# Patient Record
Sex: Female | Born: 1945
Health system: Southern US, Community
[De-identification: ages and names within clinical notes are randomized; demographics above are authoritative.]

## PROBLEM LIST (undated history)

## (undated) DIAGNOSIS — D649 Anemia, unspecified: Secondary | ICD-10-CM

## (undated) DIAGNOSIS — E559 Vitamin D deficiency, unspecified: Secondary | ICD-10-CM

## (undated) DIAGNOSIS — K219 Gastro-esophageal reflux disease without esophagitis: Secondary | ICD-10-CM

## (undated) DIAGNOSIS — R32 Unspecified urinary incontinence: Secondary | ICD-10-CM

## (undated) DIAGNOSIS — C50919 Malignant neoplasm of unspecified site of unspecified female breast: Secondary | ICD-10-CM

## (undated) DIAGNOSIS — D126 Benign neoplasm of colon, unspecified: Secondary | ICD-10-CM

## (undated) DIAGNOSIS — H269 Unspecified cataract: Secondary | ICD-10-CM

## (undated) DIAGNOSIS — K5790 Diverticulosis of intestine, part unspecified, without perforation or abscess without bleeding: Secondary | ICD-10-CM

## (undated) DIAGNOSIS — M549 Dorsalgia, unspecified: Secondary | ICD-10-CM

## (undated) DIAGNOSIS — I10 Essential (primary) hypertension: Secondary | ICD-10-CM

## (undated) DIAGNOSIS — S32000A Wedge compression fracture of unspecified lumbar vertebra, initial encounter for closed fracture: Secondary | ICD-10-CM

## (undated) DIAGNOSIS — H409 Unspecified glaucoma: Secondary | ICD-10-CM

## (undated) DIAGNOSIS — F419 Anxiety disorder, unspecified: Secondary | ICD-10-CM

## (undated) DIAGNOSIS — N8110 Cystocele, unspecified: Secondary | ICD-10-CM

## (undated) DIAGNOSIS — Z780 Asymptomatic menopausal state: Secondary | ICD-10-CM

## (undated) HISTORY — DX: Vitamin D deficiency, unspecified: E55.9

## (undated) HISTORY — DX: Asymptomatic menopausal state: Z78.0

## (undated) HISTORY — DX: Dorsalgia, unspecified: M54.9

## (undated) HISTORY — DX: Unspecified glaucoma: H40.9

## (undated) HISTORY — DX: Gastro-esophageal reflux disease without esophagitis: K21.9

## (undated) HISTORY — PX: POLYPECTOMY: SHX149

## (undated) HISTORY — DX: Anxiety disorder, unspecified: F41.9

## (undated) HISTORY — DX: Unspecified urinary incontinence: R32

## (undated) HISTORY — DX: Diverticulosis of intestine, part unspecified, without perforation or abscess without bleeding: K57.90

## (undated) HISTORY — DX: Malignant neoplasm of unspecified site of unspecified female breast: C50.919

## (undated) HISTORY — PX: COLONOSCOPY: SHX174

## (undated) HISTORY — DX: Wedge compression fracture of unspecified lumbar vertebra, initial encounter for closed fracture: S32.000A

## (undated) HISTORY — DX: Anemia, unspecified: D64.9

---
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
## 1998-05-09 ENCOUNTER — Emergency Department (HOSPITAL_COMMUNITY): Admission: EM | Admit: 1998-05-09 | Discharge: 1998-05-10 | Payer: Self-pay

## 1998-05-11 ENCOUNTER — Emergency Department (HOSPITAL_COMMUNITY): Admission: EM | Admit: 1998-05-11 | Discharge: 1998-05-11 | Payer: Self-pay | Admitting: Emergency Medicine

## 2002-12-05 DIAGNOSIS — D126 Benign neoplasm of colon, unspecified: Secondary | ICD-10-CM

## 2002-12-05 HISTORY — DX: Benign neoplasm of colon, unspecified: D12.6

## 2004-11-17 ENCOUNTER — Other Ambulatory Visit: Admission: RE | Admit: 2004-11-17 | Discharge: 2004-11-17 | Payer: Self-pay | Admitting: Obstetrics and Gynecology

## 2004-11-25 ENCOUNTER — Encounter: Admission: RE | Admit: 2004-11-25 | Discharge: 2004-11-25 | Payer: Self-pay | Admitting: Obstetrics and Gynecology

## 2005-11-21 ENCOUNTER — Other Ambulatory Visit: Admission: RE | Admit: 2005-11-21 | Discharge: 2005-11-21 | Payer: Self-pay | Admitting: Obstetrics and Gynecology

## 2006-10-10 ENCOUNTER — Encounter: Admission: RE | Admit: 2006-10-10 | Discharge: 2006-10-10 | Payer: Self-pay | Admitting: Obstetrics and Gynecology

## 2007-02-26 ENCOUNTER — Other Ambulatory Visit: Admission: RE | Admit: 2007-02-26 | Discharge: 2007-02-26 | Payer: Self-pay | Admitting: Obstetrics and Gynecology

## 2007-10-31 ENCOUNTER — Emergency Department (HOSPITAL_COMMUNITY): Admission: EM | Admit: 2007-10-31 | Discharge: 2007-11-01 | Payer: Self-pay | Admitting: Emergency Medicine

## 2007-11-12 ENCOUNTER — Ambulatory Visit: Payer: Self-pay | Admitting: Gastroenterology

## 2007-12-05 DIAGNOSIS — K5730 Diverticulosis of large intestine without perforation or abscess without bleeding: Secondary | ICD-10-CM | POA: Insufficient documentation

## 2007-12-05 DIAGNOSIS — K449 Diaphragmatic hernia without obstruction or gangrene: Secondary | ICD-10-CM | POA: Insufficient documentation

## 2007-12-05 DIAGNOSIS — M8580 Other specified disorders of bone density and structure, unspecified site: Secondary | ICD-10-CM | POA: Insufficient documentation

## 2007-12-05 DIAGNOSIS — K648 Other hemorrhoids: Secondary | ICD-10-CM | POA: Insufficient documentation

## 2007-12-05 DIAGNOSIS — D126 Benign neoplasm of colon, unspecified: Secondary | ICD-10-CM | POA: Insufficient documentation

## 2007-12-05 DIAGNOSIS — K5732 Diverticulitis of large intestine without perforation or abscess without bleeding: Secondary | ICD-10-CM | POA: Insufficient documentation

## 2007-12-17 ENCOUNTER — Ambulatory Visit: Payer: Self-pay | Admitting: Gastroenterology

## 2007-12-17 ENCOUNTER — Encounter: Payer: Self-pay | Admitting: Gastroenterology

## 2008-03-03 ENCOUNTER — Other Ambulatory Visit: Admission: RE | Admit: 2008-03-03 | Discharge: 2008-03-03 | Payer: Self-pay | Admitting: Obstetrics and Gynecology

## 2008-09-01 ENCOUNTER — Ambulatory Visit: Payer: Self-pay | Admitting: Internal Medicine

## 2008-09-01 DIAGNOSIS — J309 Allergic rhinitis, unspecified: Secondary | ICD-10-CM | POA: Insufficient documentation

## 2008-09-01 DIAGNOSIS — E559 Vitamin D deficiency, unspecified: Secondary | ICD-10-CM | POA: Insufficient documentation

## 2008-09-01 DIAGNOSIS — K219 Gastro-esophageal reflux disease without esophagitis: Secondary | ICD-10-CM | POA: Insufficient documentation

## 2008-09-01 DIAGNOSIS — D509 Iron deficiency anemia, unspecified: Secondary | ICD-10-CM | POA: Insufficient documentation

## 2008-09-01 LAB — CONVERTED CEMR LAB
BUN: 11 mg/dL (ref 6–23)
Basophils Absolute: 0 10*3/uL (ref 0.0–0.1)
Basophils Relative: 0.1 % (ref 0.0–3.0)
CO2: 29 meq/L (ref 19–32)
Calcium: 8.7 mg/dL (ref 8.4–10.5)
Chloride: 112 meq/L (ref 96–112)
Creatinine, Ser: 0.8 mg/dL (ref 0.4–1.2)
Eosinophils Absolute: 0.2 10*3/uL (ref 0.0–0.7)
GFR calc Af Amer: 94 mL/min
GFR calc non Af Amer: 78 mL/min
Glucose, Bld: 102 mg/dL — ABNORMAL HIGH (ref 70–99)
HCT: 38.9 % (ref 36.0–46.0)
Hemoglobin: 13.3 g/dL (ref 12.0–15.0)
Lymphocytes Relative: 26.6 % (ref 12.0–46.0)
MCHC: 34.3 g/dL (ref 30.0–36.0)
MCV: 91.7 fL (ref 78.0–100.0)
Monocytes Absolute: 0.4 10*3/uL (ref 0.1–1.0)
Monocytes Relative: 7.9 % (ref 3.0–12.0)
Neutro Abs: 3.5 10*3/uL (ref 1.4–7.7)
Neutrophils Relative %: 62.3 % (ref 43.0–77.0)
Platelets: 212 10*3/uL (ref 150–400)
Potassium: 4.3 meq/L (ref 3.5–5.1)
RBC: 4.24 M/uL (ref 3.87–5.11)
RDW: 12.1 % (ref 11.5–14.6)
Sodium: 144 meq/L (ref 135–145)
WBC: 5.6 10*3/uL (ref 4.5–10.5)

## 2008-09-02 LAB — CONVERTED CEMR LAB: Vit D, 1,25-Dihydroxy: 39 (ref 30–89)

## 2008-09-15 ENCOUNTER — Ambulatory Visit: Payer: Self-pay | Admitting: Internal Medicine

## 2008-11-24 ENCOUNTER — Ambulatory Visit: Payer: Self-pay | Admitting: Internal Medicine

## 2009-03-09 ENCOUNTER — Other Ambulatory Visit: Admission: RE | Admit: 2009-03-09 | Discharge: 2009-03-09 | Payer: Self-pay | Admitting: Obstetrics and Gynecology

## 2009-05-25 ENCOUNTER — Ambulatory Visit: Payer: Self-pay | Admitting: Internal Medicine

## 2009-05-26 LAB — CONVERTED CEMR LAB
AST: 19 units/L (ref 0–37)
Albumin: 3.7 g/dL (ref 3.5–5.2)
Alkaline Phosphatase: 61 units/L (ref 39–117)
Basophils Absolute: 0 10*3/uL (ref 0.0–0.1)
Basophils Relative: 0.7 % (ref 0.0–3.0)
Bilirubin Urine: NEGATIVE
Bilirubin, Direct: 0.1 mg/dL (ref 0.0–0.3)
CO2: 29 meq/L (ref 19–32)
Creatinine, Ser: 0.7 mg/dL (ref 0.4–1.2)
Eosinophils Absolute: 0.2 10*3/uL (ref 0.0–0.7)
Eosinophils Relative: 2.6 % (ref 0.0–5.0)
GFR calc non Af Amer: 90.15 mL/min (ref >60)
Glucose, Bld: 89 mg/dL (ref 70–99)
HCT: 37.8 % (ref 36.0–46.0)
Hemoglobin, Urine: NEGATIVE
Hemoglobin: 13.3 g/dL (ref 12.0–15.0)
Iron: 84 ug/dL (ref 42–145)
Ketones, ur: NEGATIVE mg/dL
Lymphocytes Relative: 23.4 % (ref 12.0–46.0)
Lymphs Abs: 1.4 10*3/uL (ref 0.7–4.0)
MCV: 91.3 fL (ref 78.0–100.0)
Monocytes Absolute: 0.4 10*3/uL (ref 0.1–1.0)
Monocytes Relative: 7.1 % (ref 3.0–12.0)
Neutro Abs: 4 10*3/uL (ref 1.4–7.7)
Neutrophils Relative %: 66.2 % (ref 43.0–77.0)
Platelets: 214 10*3/uL (ref 150.0–400.0)
Potassium: 4.1 meq/L (ref 3.5–5.1)
RBC: 4.14 M/uL (ref 3.87–5.11)
RDW: 12.4 % (ref 11.5–14.6)
Saturation Ratios: 24.3 % (ref 20.0–50.0)
Sodium: 144 meq/L (ref 135–145)
Total Bilirubin: 0.6 mg/dL (ref 0.3–1.2)
Total Protein, Urine: NEGATIVE mg/dL
Total Protein: 6.8 g/dL (ref 6.0–8.3)
Urine Glucose: NEGATIVE mg/dL
Urobilinogen, UA: 0.2 (ref 0.0–1.0)
WBC: 6 10*3/uL (ref 4.5–10.5)
pH: 6 (ref 5.0–8.0)

## 2009-08-26 ENCOUNTER — Ambulatory Visit: Payer: Self-pay | Admitting: Internal Medicine

## 2009-08-26 LAB — CONVERTED CEMR LAB
ALT: 17 units/L (ref 0–35)
AST: 19 units/L (ref 0–37)
Albumin: 3.8 g/dL (ref 3.5–5.2)
Alkaline Phosphatase: 57 units/L (ref 39–117)
BUN: 12 mg/dL (ref 6–23)
Basophils Absolute: 0 10*3/uL (ref 0.0–0.1)
Basophils Relative: 0.6 % (ref 0.0–3.0)
Bilirubin Urine: NEGATIVE
Bilirubin, Direct: 0.1 mg/dL (ref 0.0–0.3)
CO2: 29 meq/L (ref 19–32)
Calcium: 9.1 mg/dL (ref 8.4–10.5)
Chloride: 110 meq/L (ref 96–112)
Cholesterol: 162 mg/dL (ref 0–200)
Creatinine, Ser: 0.7 mg/dL (ref 0.4–1.2)
Eosinophils Absolute: 0.1 10*3/uL (ref 0.0–0.7)
Eosinophils Relative: 2.7 % (ref 0.0–5.0)
GFR calc non Af Amer: 90.08 mL/min (ref >60)
Glucose, Bld: 85 mg/dL (ref 70–99)
HCT: 38.8 % (ref 36.0–46.0)
HDL: 44.5 mg/dL (ref >39.00)
Hemoglobin, Urine: NEGATIVE
Hemoglobin: 13.5 g/dL (ref 12.0–15.0)
Ketones, ur: NEGATIVE mg/dL
LDL Cholesterol: 104 mg/dL — ABNORMAL HIGH (ref 0–99)
Leukocytes, UA: NEGATIVE
Lymphocytes Relative: 22.1 % (ref 12.0–46.0)
Lymphs Abs: 1.1 10*3/uL (ref 0.7–4.0)
MCHC: 34.7 g/dL (ref 30.0–36.0)
MCV: 91.9 fL (ref 78.0–100.0)
Monocytes Absolute: 0.3 10*3/uL (ref 0.1–1.0)
Monocytes Relative: 6.6 % (ref 3.0–12.0)
Neutro Abs: 3.6 10*3/uL (ref 1.4–7.7)
Neutrophils Relative %: 68 % (ref 43.0–77.0)
Nitrite: NEGATIVE
Platelets: 214 10*3/uL (ref 150.0–400.0)
Potassium: 4.2 meq/L (ref 3.5–5.1)
RBC: 4.23 M/uL (ref 3.87–5.11)
RDW: 12.3 % (ref 11.5–14.6)
Sodium: 143 meq/L (ref 135–145)
Specific Gravity, Urine: 1.025 (ref 1.000–1.030)
TSH: 1.13 microintl units/mL (ref 0.35–5.50)
Total Bilirubin: 0.6 mg/dL (ref 0.3–1.2)
Total CHOL/HDL Ratio: 4
Total Protein, Urine: NEGATIVE mg/dL
Total Protein: 7 g/dL (ref 6.0–8.3)
Triglycerides: 69 mg/dL (ref 0.0–149.0)
Urine Glucose: NEGATIVE mg/dL
Urobilinogen, UA: 0.2 (ref 0.0–1.0)
VLDL: 13.8 mg/dL (ref 0.0–40.0)
WBC: 5.1 10*3/uL (ref 4.5–10.5)
pH: 5.5 (ref 5.0–8.0)

## 2009-08-31 ENCOUNTER — Ambulatory Visit: Payer: Self-pay | Admitting: Internal Medicine

## 2009-08-31 DIAGNOSIS — J029 Acute pharyngitis, unspecified: Secondary | ICD-10-CM | POA: Insufficient documentation

## 2009-12-01 ENCOUNTER — Encounter: Admission: RE | Admit: 2009-12-01 | Discharge: 2009-12-01 | Payer: Self-pay | Admitting: Obstetrics and Gynecology

## 2009-12-21 ENCOUNTER — Ambulatory Visit: Payer: Self-pay | Admitting: Internal Medicine

## 2010-03-15 ENCOUNTER — Other Ambulatory Visit: Admission: RE | Admit: 2010-03-15 | Discharge: 2010-03-15 | Payer: Self-pay | Admitting: Obstetrics and Gynecology

## 2010-09-27 ENCOUNTER — Ambulatory Visit: Payer: Self-pay | Admitting: Internal Medicine

## 2011-01-06 NOTE — Assessment & Plan Note (Signed)
Summary: FLU SHOT/JSS   Nurse Visit   Allergies: 1)  ! Levaquin (Levofloxacin in D5w) 2)  Benazepril Hcl (Benazepril Hcl)  Orders Added: 1)  Admin 1st Vaccine [90471] 2)  Flu Vaccine 29yrs + [16109]  Flu Vaccine Consent Questions     Do you have a history of severe allergic reactions to this vaccine? no    Any prior history of allergic reactions to egg and/or gelatin? no    Do you have a sensitivity to the preservative Thimersol? no    Do you have a past history of Guillan-Barre Syndrome? no    Do you currently have an acute febrile illness? no    Have you ever had a severe reaction to latex? no    Vaccine information given and explained to patient? yes    Are you currently pregnant? no    Lot Number:[id number]   Exp Date:06/04/2011   Site Given  Left Deltoid IM1

## 2011-01-06 NOTE — Assessment & Plan Note (Signed)
Summary: SHINGLE VAC-PER SPOUSE INSUR WILL PAY  -AVP  STC   Nurse Visit   Allergies: 1)  ! Levaquin (Levofloxacin in D5w) 2)  Benazepril Hcl (Benazepril Hcl)  Immunizations Administered:  Zostavax # 1:    Vaccine Type: Zostavax    Site: left deltoid    Mfr: Merck    Dose: 0.5 ml    Route: IM    Given by: Lucious Groves    Exp. Date: 01/01/2011    Lot #: [id number]    VIS given: 09/16/05 given December 21, 2009.  Orders Added: 1)  Zoster (Shingles) Vaccine Live [90736] 2)  Admin 1st Vaccine [phone]

## 2011-04-19 NOTE — Assessment & Plan Note (Signed)
[facility]                         GASTROENTEROLOGY OFFICE NOTE   Diane, Sawyer                         MRN:          [id number]  DATE:11/12/2007                            DOB:          January 29, 1946    REFERRING PHYSICIAN:  Devoria Albe, M.D.   REASON FOR CONSULTATION:  Diverticulitis.   HISTORY OF PRESENT ILLNESS:  Diane Sawyer is a 65 year old white female whom  I have seen in the past.  She underwent a colonoscopy in May 2004, that  revealed sigmoid colon diverticulosis, internal hemorrhoids and a small  adenomatous colon polyp.  She was recommended to have a recall  colonoscopy in May 2009.  She noted worsening problems with left lower  quadrant abdominal and pelvic pain that gradually worsened over several  days, and she presented to an Urgent Care Center on October 31, 2007.  She was then referred to [facility] Emergency Room for further  evaluation, where an elevated white blood cell count was noted and a CT  scan of the abdomen and pelvis showed sigmoid colon diverticulitis with  a small amount of free fluid in the pelvis and a moderate-sized hiatal  hernia was also noted.  She was treated with a 10-day course of Cipro  and Flagyl and her symptoms have essentially resolved.  She notes only a  very minimal amount of left lower quadrant pain.  She has had some  nausea and diarrhea, that began since starting antibiotics.  She notes  no bleeding or weight loss.  There is no family history of colon cancer,  colon polyps or inflammatory bowel disease.   PAST MEDICAL HISTORY:  1. Osteopenia.  2. Diverticulosis.  3. Diverticulitis.  4. Internal hemorrhoids.  5. Adenomatous colon polyp.  6. Hialal hernia   CURRENT MEDICATIONS:  Listed on the chart, updated and reviewed.   ALLERGIES:  LEVAQUIN AND GUAIPHENESIN.   SOCIAL HISTORY/REVIEW OF SYSTEMS:  Per the handwritten form.   PHYSICAL EXAMINATION:  GENERAL:  Well-developed and  well-nourished,  mildly anxious white female.  VITAL SIGNS:  Height 5 feet 3 inches, weight 191 pounds, blood pressure  146/80, pulse 80 and regular.  HEENT:  Anicteric sclerae.  Oropharynx clear.  CHEST:  Clear to auscultation bilaterally.  HEART:  A regular rate and rhythm without murmurs appreciated.  ABDOMEN:  Soft, non-distended.  Minimal left lower quadrant tenderness  to deep palpation.  No rebound or guarding.  No palpable organomegaly,  masses or herniae.  Normoactive bowel sounds.  RECTAL:  Examination deferred until time of colonoscopy.  EXTREMITIES:  Without clubbing, cyanosis or edema.  NEUROLOGIC:  Alert and oriented x3.  Grossly nonfocal.   ASSESSMENT/PLAN:  Recent sigmoid colon diverticulitis - clinically  resolving. A personal history of adenomatous colon polyps.  Rule out  colorectal neoplasm.  The risks, benefits and alternatives to  colonoscopy with possible biopsy and possible polypectomy discussed with  the patient.  She consents to proceed.  This will be scheduled  electively.  She is advised to maintain a high-fiber diet with adequate  fluid intake.  If her nausea and  diarrhea do not resolve off of  antibiotics, she will call for further recommendations.     Diane Sawyer. Diane Dar, MD, [facility]  Electronically Signed    MTS/MedQ  DD: 11/12/2007  DT: 11/12/2007  Job #: [id number]   cc:   Devoria Albe, M.D.

## 2011-09-13 LAB — CBC
HCT: 38.6
Hemoglobin: 13.1
MCHC: 33.9
MCV: 88.6
Platelets: 231
RBC: 4.36
RDW: 12.3
WBC: 11.7 — ABNORMAL HIGH

## 2011-09-13 LAB — COMPREHENSIVE METABOLIC PANEL
ALT: 13
AST: 16
Albumin: 3.8
Alkaline Phosphatase: 57
BUN: 6
CO2: 27
Calcium: 8.9
Chloride: 106
Creatinine, Ser: 0.69
GFR calc Af Amer: >60
GFR calc non Af Amer: >60
Glucose, Bld: 104 — ABNORMAL HIGH
Potassium: 3.7
Sodium: 138
Total Bilirubin: 0.8
Total Protein: 6.8

## 2011-09-13 LAB — DIFFERENTIAL
Basophils Absolute: 0
Basophils Relative: 0
Eosinophils Absolute: 0.1 — ABNORMAL LOW
Eosinophils Relative: 1
Lymphocytes Relative: 14
Lymphs Abs: 1.7
Monocytes Absolute: 0.5
Monocytes Relative: 5
Neutro Abs: 9.4 — ABNORMAL HIGH
Neutrophils Relative %: 80 — ABNORMAL HIGH

## 2011-09-13 LAB — URINALYSIS, ROUTINE W REFLEX MICROSCOPIC
Bilirubin Urine: NEGATIVE
Glucose, UA: NEGATIVE
Hgb urine dipstick: NEGATIVE
Ketones, ur: NEGATIVE
Nitrite: NEGATIVE
Protein, ur: NEGATIVE
Specific Gravity, Urine: 1.013
Urobilinogen, UA: 0.2
pH: 6.5

## 2011-09-14 ENCOUNTER — Telehealth: Payer: Self-pay | Admitting: *Deleted

## 2011-09-14 MED ORDER — AMLODIPINE BESYLATE 5 MG PO TABS: 5.0000 mg | ORAL_TABLET | Freq: Every day | ORAL | Status: DC

## 2011-09-14 NOTE — Telephone Encounter (Signed)
Patient requesting RF of her BP med, she will be out today.

## 2011-09-14 NOTE — Telephone Encounter (Signed)
Pt informed Rf sent to pharmacy.

## 2011-11-01 ENCOUNTER — Other Ambulatory Visit: Payer: Self-pay | Admitting: Internal Medicine

## 2011-11-01 DIAGNOSIS — Z1231 Encounter for screening mammogram for malignant neoplasm of breast: Secondary | ICD-10-CM

## 2011-11-04 ENCOUNTER — Ambulatory Visit
Admission: RE | Admit: 2011-11-04 | Discharge: 2011-11-04 | Disposition: A | Discharge status: Home / Self Care | Payer: BC Managed Care – PPO | Source: Ambulatory Visit | Attending: Internal Medicine | Admitting: Internal Medicine

## 2011-11-04 DIAGNOSIS — Z1231 Encounter for screening mammogram for malignant neoplasm of breast: Secondary | ICD-10-CM

## 2011-11-07 ENCOUNTER — Encounter: Payer: Self-pay | Admitting: Internal Medicine

## 2011-11-07 ENCOUNTER — Ambulatory Visit (INDEPENDENT_AMBULATORY_CARE_PROVIDER_SITE_OTHER): Payer: BC Managed Care – PPO | Admitting: Internal Medicine

## 2011-11-07 DIAGNOSIS — G4700 Insomnia, unspecified: Secondary | ICD-10-CM

## 2011-11-07 DIAGNOSIS — K219 Gastro-esophageal reflux disease without esophagitis: Secondary | ICD-10-CM

## 2011-11-07 DIAGNOSIS — E559 Vitamin D deficiency, unspecified: Secondary | ICD-10-CM

## 2011-11-07 DIAGNOSIS — I10 Essential (primary) hypertension: Secondary | ICD-10-CM

## 2011-11-07 NOTE — Assessment & Plan Note (Addendum)
Continue with current prescription/OTC Prilosec therapy as reflected on the Med list.

## 2011-11-07 NOTE — Assessment & Plan Note (Signed)
Declined Rx She will try Valerian

## 2011-11-07 NOTE — Assessment & Plan Note (Signed)
Continue with current prescription therapy as reflected on the Med list.  

## 2011-11-07 NOTE — Progress Notes (Signed)
  Subjective:    Patient ID: Diane Sawyer, female    DOB: [birth year], 65 y.o.   MRN: [id number]  HPI  The patient presents for a follow-up of  chronic hypertension, GERD controlled with medicines C/o stress and insomnia - husband was sick    Review of Systems  Constitutional: Positive for unexpected weight change. Negative for chills, activity change, appetite change and fatigue.  HENT: Negative for congestion, mouth sores and sinus pressure.   Eyes: Negative for visual disturbance.  Respiratory: Negative for cough and chest tightness.   Gastrointestinal: Negative for nausea and abdominal pain.  Genitourinary: Negative for frequency, difficulty urinating and vaginal pain.  Musculoskeletal: Negative for back pain and gait problem.  Skin: Negative for pallor and rash.  Neurological: Negative for dizziness, tremors, weakness, numbness and headaches.  Psychiatric/Behavioral: Negative for confusion and sleep disturbance.   Wt Readings from Last 3 Encounters:  11/07/11 211 lb (95.709 kg)  08/31/09 201 lb (91.173 kg)  05/25/09 201 lb (91.173 kg)       Objective:   Physical Exam  Constitutional: She appears well-developed. No distress.       obese  HENT:  Head: Normocephalic.  Right Ear: External ear normal.  Left Ear: External ear normal.  Nose: Nose normal.  Mouth/Throat: Oropharynx is clear and moist.  Eyes: Conjunctivae are normal. Pupils are equal, round, and reactive to light. Right eye exhibits no discharge. Left eye exhibits no discharge.  Neck: Normal range of motion. Neck supple. No JVD present. No tracheal deviation present. No thyromegaly present.  Cardiovascular: Normal rate, regular rhythm and normal heart sounds.   Pulmonary/Chest: No stridor. No respiratory distress. She has no wheezes.  Abdominal: Soft. Bowel sounds are normal. She exhibits no distension and no mass. There is no tenderness. There is no rebound and no guarding.  Musculoskeletal: She exhibits no edema  and no tenderness.  Lymphadenopathy:    She has no cervical adenopathy.  Neurological: She displays normal reflexes. No cranial nerve deficit. She exhibits normal muscle tone. Coordination normal.  Skin: No rash noted. No erythema.  Psychiatric: She has a normal mood and affect. Her behavior is normal. Judgment and thought content normal.          Assessment & Plan:

## 2011-12-26 ENCOUNTER — Other Ambulatory Visit: Payer: Self-pay

## 2011-12-26 MED ORDER — AMLODIPINE BESYLATE 5 MG PO TABS: 5.0000 mg | ORAL_TABLET | Freq: Every day | ORAL | Status: DC

## 2012-05-07 ENCOUNTER — Other Ambulatory Visit (INDEPENDENT_AMBULATORY_CARE_PROVIDER_SITE_OTHER): Payer: BC Managed Care – PPO

## 2012-05-07 ENCOUNTER — Encounter: Payer: Self-pay | Admitting: Internal Medicine

## 2012-05-07 ENCOUNTER — Ambulatory Visit (INDEPENDENT_AMBULATORY_CARE_PROVIDER_SITE_OTHER): Payer: BC Managed Care – PPO | Admitting: Internal Medicine

## 2012-05-07 VITALS — BP 130/80 | HR 80 | Temp 98.2°F | Resp 16 | Wt 212.0 lb

## 2012-05-07 DIAGNOSIS — D509 Iron deficiency anemia, unspecified: Secondary | ICD-10-CM

## 2012-05-07 DIAGNOSIS — K219 Gastro-esophageal reflux disease without esophagitis: Secondary | ICD-10-CM

## 2012-05-07 DIAGNOSIS — I10 Essential (primary) hypertension: Secondary | ICD-10-CM

## 2012-05-07 LAB — URINALYSIS
Bilirubin Urine: NEGATIVE
Hgb urine dipstick: NEGATIVE
Ketones, ur: NEGATIVE
Leukocytes, UA: NEGATIVE
Specific Gravity, Urine: 1.01 (ref 1.000–1.030)
Urine Glucose: NEGATIVE
Urobilinogen, UA: 0.2 (ref 0.0–1.0)
pH: 6 (ref 5.0–8.0)

## 2012-05-07 LAB — CBC WITH DIFFERENTIAL/PLATELET
Basophils Absolute: 0 K/uL (ref 0.0–0.1)
Basophils Relative: 0.5 % (ref 0.0–3.0)
Eosinophils Absolute: 0.3 K/uL (ref 0.0–0.7)
Eosinophils Relative: 3.9 % (ref 0.0–5.0)
HCT: 40.4 % (ref 36.0–46.0)
Hemoglobin: 13.4 g/dL (ref 12.0–15.0)
Lymphocytes Relative: 30.4 % (ref 12.0–46.0)
Lymphs Abs: 2 K/uL (ref 0.7–4.0)
MCHC: 33.1 g/dL (ref 30.0–36.0)
MCV: 90.8 fl (ref 78.0–100.0)
Monocytes Absolute: 0.5 K/uL (ref 0.1–1.0)
Monocytes Relative: 6.9 % (ref 3.0–12.0)
Neutro Abs: 3.8 K/uL (ref 1.4–7.7)
Neutrophils Relative %: 58.3 % (ref 43.0–77.0)
Platelets: 219 K/uL (ref 150.0–400.0)
RBC: 4.45 Mil/uL (ref 3.87–5.11)
RDW: 13.4 % (ref 11.5–14.6)
WBC: 6.5 K/uL (ref 4.5–10.5)

## 2012-05-07 LAB — TSH: TSH: 0.84 u[IU]/mL (ref 0.35–5.50)

## 2012-05-07 LAB — BASIC METABOLIC PANEL
BUN: 12 mg/dL (ref 6–23)
CO2: 27 mEq/L (ref 19–32)
Calcium: 9.1 mg/dL (ref 8.4–10.5)
Chloride: 105 mEq/L (ref 96–112)
Creatinine, Ser: 0.6 mg/dL (ref 0.4–1.2)
GFR: 115.53 mL/min (ref >60.00)
Glucose, Bld: 81 mg/dL (ref 70–99)
Potassium: 4.3 mEq/L (ref 3.5–5.1)
Sodium: 141 mEq/L (ref 135–145)

## 2012-05-07 LAB — IBC PANEL
Iron: 97 ug/dL (ref 42–145)
Saturation Ratios: 30.2 % (ref 20.0–50.0)
Transferrin: 229.7 mg/dL (ref 212.0–360.0)

## 2012-05-07 MED ORDER — PANTOPRAZOLE SODIUM 40 MG PO TBEC: 40.0000 mg | DELAYED_RELEASE_TABLET | Freq: Every day | ORAL | Status: DC

## 2012-05-07 NOTE — Progress Notes (Signed)
Patient ID: Diane Sawyer, female   DOB: 07-08-1946, 66 y.o.   MRN: [id number]  Subjective:    Patient ID: Diane Sawyer, female    DOB: 1946/04/07, 66 y.o.   MRN: [id number]  HPI  The patient presents for a follow-up of  chronic hypertension, GERD controlled with medicines C/o stress and insomnia - husband just had a bypass    Review of Systems  Constitutional: Negative for chills, activity change, appetite change, fatigue and unexpected weight change.  HENT: Negative for congestion, mouth sores and sinus pressure.   Eyes: Negative for visual disturbance.  Respiratory: Negative for cough and chest tightness.   Gastrointestinal: Negative for nausea and abdominal pain.  Genitourinary: Negative for frequency, difficulty urinating and vaginal pain.  Musculoskeletal: Negative for back pain and gait problem.  Skin: Negative for pallor and rash.  Neurological: Negative for dizziness, tremors, weakness, numbness and headaches.  Psychiatric/Behavioral: Negative for suicidal ideas, confusion and sleep disturbance. The patient is not nervous/anxious.    Wt Readings from Last 3 Encounters:  05/07/12 212 lb (96.163 kg)  11/07/11 211 lb (95.709 kg)  08/31/09 201 lb (91.173 kg)    BP Readings from Last 3 Encounters:  05/07/12 130/80  11/07/11 104/76  08/31/09 96/66       Objective:   Physical Exam  Constitutional: She appears well-developed. No distress.       obese  HENT:  Head: Normocephalic.  Right Ear: External ear normal.  Left Ear: External ear normal.  Nose: Nose normal.  Mouth/Throat: Oropharynx is clear and moist.  Eyes: Conjunctivae are normal. Pupils are equal, round, and reactive to light. Right eye exhibits no discharge. Left eye exhibits no discharge.  Neck: Normal range of motion. Neck supple. No JVD present. No tracheal deviation present. No thyromegaly present.  Cardiovascular: Normal rate, regular rhythm and normal heart sounds.   Pulmonary/Chest: No stridor. No  respiratory distress. She has no wheezes.  Abdominal: Soft. Bowel sounds are normal. She exhibits no distension and no mass. There is no tenderness. There is no rebound and no guarding.  Musculoskeletal: She exhibits no edema and no tenderness.  Lymphadenopathy:    She has no cervical adenopathy.  Neurological: She displays normal reflexes. No cranial nerve deficit. She exhibits normal muscle tone. Coordination normal.  Skin: No rash noted. No erythema.  Psychiatric: She has a normal mood and affect. Her behavior is normal. Judgment and thought content normal.    Lab Results  Component Value Date   WBC 5.1 08/26/2009   HGB 13.5 08/26/2009   HCT 38.8 08/26/2009   PLT 214.0 08/26/2009   GLUCOSE 85 08/26/2009   CHOL 162 08/26/2009   TRIG 69.0 08/26/2009   HDL 44.50 08/26/2009   LDLCALC 104* 08/26/2009   ALT 17 08/26/2009   AST 19 08/26/2009   NA 143 08/26/2009   K 4.2 08/26/2009   CL 110 08/26/2009   CREATININE 0.7 08/26/2009   BUN 12 08/26/2009   CO2 29 08/26/2009   TSH 1.13 08/26/2009         Assessment & Plan:

## 2012-05-07 NOTE — Assessment & Plan Note (Signed)
Continue with current prescription therapy as reflected on the Med list.  

## 2012-05-07 NOTE — Assessment & Plan Note (Signed)
Change to protonix per pt's request

## 2012-05-07 NOTE — Assessment & Plan Note (Signed)
Cbc, iron 

## 2012-10-19 ENCOUNTER — Other Ambulatory Visit: Payer: Self-pay | Admitting: Internal Medicine

## 2012-11-06 ENCOUNTER — Encounter: Payer: Self-pay | Admitting: Gastroenterology

## 2012-11-09 ENCOUNTER — Other Ambulatory Visit: Payer: Self-pay | Admitting: Internal Medicine

## 2012-11-09 DIAGNOSIS — Z1231 Encounter for screening mammogram for malignant neoplasm of breast: Secondary | ICD-10-CM

## 2012-11-12 ENCOUNTER — Encounter: Payer: Self-pay | Admitting: Internal Medicine

## 2012-11-12 ENCOUNTER — Ambulatory Visit (INDEPENDENT_AMBULATORY_CARE_PROVIDER_SITE_OTHER): Payer: BLUE CROSS/BLUE SHIELD | Admitting: Internal Medicine

## 2012-11-12 ENCOUNTER — Other Ambulatory Visit (INDEPENDENT_AMBULATORY_CARE_PROVIDER_SITE_OTHER): Payer: BC Managed Care – PPO

## 2012-11-12 VITALS — BP 138/78 | HR 80 | Temp 97.0°F | Resp 16 | Ht 63.0 in | Wt 206.0 lb

## 2012-11-12 DIAGNOSIS — Z0000 Encounter for general adult medical examination without abnormal findings: Secondary | ICD-10-CM | POA: Diagnosis not present

## 2012-11-12 DIAGNOSIS — D126 Benign neoplasm of colon, unspecified: Secondary | ICD-10-CM

## 2012-11-12 DIAGNOSIS — F411 Generalized anxiety disorder: Secondary | ICD-10-CM

## 2012-11-12 DIAGNOSIS — E559 Vitamin D deficiency, unspecified: Secondary | ICD-10-CM

## 2012-11-12 DIAGNOSIS — I10 Essential (primary) hypertension: Secondary | ICD-10-CM

## 2012-11-12 DIAGNOSIS — M899 Disorder of bone, unspecified: Secondary | ICD-10-CM

## 2012-11-12 DIAGNOSIS — F419 Anxiety disorder, unspecified: Secondary | ICD-10-CM

## 2012-11-12 DIAGNOSIS — Z23 Encounter for immunization: Secondary | ICD-10-CM

## 2012-11-12 DIAGNOSIS — M949 Disorder of cartilage, unspecified: Secondary | ICD-10-CM

## 2012-11-12 LAB — HEPATIC FUNCTION PANEL
AST: 18 U/L (ref 0–37)
Alkaline Phosphatase: 68 U/L (ref 39–117)
Bilirubin, Direct: 0 mg/dL (ref 0.0–0.3)
Total Bilirubin: 0.3 mg/dL (ref 0.3–1.2)

## 2012-11-12 LAB — CBC WITH DIFFERENTIAL/PLATELET
Basophils Absolute: 0 10*3/uL (ref 0.0–0.1)
Eosinophils Absolute: 0.2 10*3/uL (ref 0.0–0.7)
Lymphocytes Relative: 30.3 % (ref 12.0–46.0)
MCHC: 33.1 g/dL (ref 30.0–36.0)
MCV: 91.4 fl (ref 78.0–100.0)
Monocytes Absolute: 0.4 10*3/uL (ref 0.1–1.0)
Neutro Abs: 3.2 10*3/uL (ref 1.4–7.7)
Neutrophils Relative %: 58.5 % (ref 43.0–77.0)
RDW: 13.3 % (ref 11.5–14.6)

## 2012-11-12 LAB — BASIC METABOLIC PANEL
CO2: 29 mEq/L (ref 19–32)
Calcium: 9.7 mg/dL (ref 8.4–10.5)
Chloride: 106 mEq/L (ref 96–112)
Creatinine, Ser: 0.6 mg/dL (ref 0.4–1.2)
Glucose, Bld: 93 mg/dL (ref 70–99)

## 2012-11-12 LAB — URINALYSIS
Bilirubin Urine: NEGATIVE
Hgb urine dipstick: NEGATIVE
Leukocytes, UA: NEGATIVE
Nitrite: NEGATIVE
Urobilinogen, UA: 0.2 (ref 0.0–1.0)
pH: 8 (ref 5.0–8.0)

## 2012-11-12 LAB — LIPID PANEL: Total CHOL/HDL Ratio: 4

## 2012-11-12 MED ORDER — AMLODIPINE BESYLATE 5 MG PO TABS: 5.0000 mg | ORAL_TABLET | Freq: Every day | ORAL | Status: DC

## 2012-11-12 MED ORDER — PANTOPRAZOLE SODIUM 40 MG PO TBEC: 40.0000 mg | DELAYED_RELEASE_TABLET | Freq: Every day | ORAL | Status: DC

## 2012-11-12 MED ORDER — ALPRAZOLAM 0.25 MG PO TABS: 0.2500 mg | ORAL_TABLET | Freq: Two times a day (BID) | ORAL | Status: DC | PRN

## 2012-11-12 NOTE — Progress Notes (Signed)
  Subjective:    Patient ID: Diane Sawyer, female    DOB: Aug 17, 1946, 66 y.o.   MRN: [id number]  HPI The patient is here for a wellness exam. The patient has been doing well overall without major physical or psychological issues going on lately.  The patient presents for a follow-up of  chronic hypertension, GERD controlled with medicines C/o stress and insomnia - husband had a bypass in the spring of 2013    Review of Systems  Constitutional: Negative for chills, activity change, appetite change, fatigue and unexpected weight change.  HENT: Negative for congestion, mouth sores and sinus pressure.   Eyes: Negative for visual disturbance.  Respiratory: Negative for cough and chest tightness.   Gastrointestinal: Negative for nausea and abdominal pain.  Genitourinary: Negative for frequency, difficulty urinating and vaginal pain.  Musculoskeletal: Negative for back pain and gait problem.  Skin: Negative for pallor and rash.  Neurological: Negative for dizziness, tremors, weakness, numbness and headaches.  Psychiatric/Behavioral: Negative for suicidal ideas, confusion and sleep disturbance. The patient is not nervous/anxious.    Wt Readings from Last 3 Encounters:  11/12/12 206 lb (93.441 kg)  05/07/12 212 lb (96.163 kg)  11/07/11 211 lb (95.709 kg)    BP Readings from Last 3 Encounters:  11/12/12 138/78  05/07/12 130/80  11/07/11 104/76       Objective:   Physical Exam  Constitutional: She appears well-developed. No distress.       obese  HENT:  Head: Normocephalic.  Right Ear: External ear normal.  Left Ear: External ear normal.  Nose: Nose normal.  Mouth/Throat: Oropharynx is clear and moist.  Eyes: Conjunctivae normal are normal. Pupils are equal, round, and reactive to light. Right eye exhibits no discharge. Left eye exhibits no discharge.  Neck: Normal range of motion. Neck supple. No JVD present. No tracheal deviation present. No thyromegaly present.  Cardiovascular:  Normal rate, regular rhythm and normal heart sounds.   Pulmonary/Chest: No stridor. No respiratory distress. She has no wheezes.  Abdominal: Soft. Bowel sounds are normal. She exhibits no distension and no mass. There is no tenderness. There is no rebound and no guarding.  Musculoskeletal: She exhibits no edema and no tenderness.  Lymphadenopathy:    She has no cervical adenopathy.  Neurological: She displays normal reflexes. No cranial nerve deficit. She exhibits normal muscle tone. Coordination normal.  Skin: No rash noted. No erythema.  Psychiatric: She has a normal mood and affect. Her behavior is normal. Judgment and thought content normal.    Lab Results  Component Value Date   WBC 6.5 05/07/2012   HGB 13.4 05/07/2012   HCT 40.4 05/07/2012   PLT 219.0 05/07/2012   GLUCOSE 81 05/07/2012   CHOL 162 08/26/2009   TRIG 69.0 08/26/2009   HDL 44.50 08/26/2009   LDLCALC 104* 08/26/2009   ALT 17 08/26/2009   AST 19 08/26/2009   NA 141 05/07/2012   K 4.3 05/07/2012   CL 105 05/07/2012   CREATININE 0.6 05/07/2012   BUN 12 05/07/2012   CO2 27 05/07/2012   TSH 0.84 05/07/2012         Assessment & Plan:

## 2012-11-12 NOTE — Assessment & Plan Note (Addendum)
We discussed age appropriate health related issues, including available/recomended screening tests and vaccinations. We discussed a need for adhering to healthy diet and exercise. Labs/EKG were reviewed/ordered. All questions were answered. GYN appt - she'll schedule Mammo up to date Colon is due Eye exam is due

## 2012-11-12 NOTE — Assessment & Plan Note (Signed)
Will try Xanax prn

## 2012-11-12 NOTE — Assessment & Plan Note (Signed)
Continue with current prescription therapy as reflected on the Med list.  

## 2012-11-12 NOTE — Assessment & Plan Note (Signed)
Vit D 

## 2012-11-12 NOTE — Assessment & Plan Note (Signed)
Dr Russella Dar Colon is due in 2014

## 2012-11-13 ENCOUNTER — Encounter: Payer: Self-pay | Admitting: Internal Medicine

## 2012-11-30 ENCOUNTER — Emergency Department (INDEPENDENT_AMBULATORY_CARE_PROVIDER_SITE_OTHER)
Admission: EM | Admit: 2012-11-30 | Discharge: 2012-11-30 | Disposition: A | Discharge status: Home / Self Care | Payer: BC Managed Care – PPO | Source: Home / Self Care

## 2012-11-30 ENCOUNTER — Encounter (HOSPITAL_COMMUNITY): Payer: Self-pay | Admitting: *Deleted

## 2012-11-30 DIAGNOSIS — IMO0002 Reserved for concepts with insufficient information to code with codable children: Secondary | ICD-10-CM

## 2012-11-30 DIAGNOSIS — S39011A Strain of muscle, fascia and tendon of abdomen, initial encounter: Secondary | ICD-10-CM

## 2012-11-30 MED ORDER — TRAMADOL HCL 50 MG PO TABS: 50.0000 mg | ORAL_TABLET | Freq: Four times a day (QID) | ORAL | Status: DC | PRN

## 2012-11-30 NOTE — ED Provider Notes (Signed)
Medical screening examination/treatment/procedure(s) were performed by a resident physician and as supervising physician I was immediately available for consultation/collaboration.  Jes Costales, M.D.   Ayse Mccartin C Windell Musson, MD 11/30/12 [id number] 

## 2012-11-30 NOTE — ED Notes (Signed)
Reaching for a gift card that fell on the floor and leaned over to get it. She felt a twinge in LUQ of abdomen on 12/25. Started hurting later that night.  Pain got progressively worse.

## 2012-11-30 NOTE — ED Provider Notes (Signed)
Diane Sawyer is a 66 y.o. female who presents to Urgent Care today for left-sided abdominal pain 2 days ago.  Patient was sitting down and reached across her body to the floor to pick up a card.  She noted rapid onset of pain with this activity.  The pain is persistent especially with motion such as sitting up and twisting and turning.  She is pain-free at rest.  She denies any nausea vomiting diarrhea fever or chills.  She thinks she pulled an abdominal muscle.  She is taking some anti-inflammatory pain medications which have helped.  She feels well otherwise and wants to confirm the diagnosis.  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  The pain does not radiate.    PMH reviewed. GERD History  Substance Use Topics  . Smoking status: Never Smoker   . Smokeless tobacco: Not on file  . Alcohol Use: No   ROS as above Medications reviewed. No current facility-administered medications for this encounter.   Current Outpatient Prescriptions  Medication Sig Dispense Refill  . ALPRAZolam (XANAX) 0.25 MG tablet Take 1 tablet (0.25 mg total) by mouth 2 (two) times daily as needed for anxiety.  60 tablet  1  . amLODipine (NORVASC) 5 MG tablet Take 1 tablet (5 mg total) by mouth daily.  90 tablet  3  . CALCIUM PO Take by mouth daily.        . Cholecalciferol 1000 UNITS capsule Take 1,000 Units by mouth daily.        . fexofenadine (ALLEGRA) 180 MG tablet Take 180 mg by mouth daily.        . IRON PO Take by mouth daily.        . pantoprazole (PROTONIX) 40 MG tablet Take 1 tablet (40 mg total) by mouth daily.  90 tablet  3  . traMADol (ULTRAM) 50 MG tablet Take 1 tablet (50 mg total) by mouth every 6 (six) hours as needed for pain.  30 tablet  0    Exam:  BP 155/73  Pulse 74  Temp 98.2 F (36.8 C) (Oral)  Resp 18  SpO2 98% Gen: Well NAD HEENT: EOMI,  MMM Lungs: CTABL Nl WOB Heart: RRR no MRG Abd: NABS, nondistended.  Mildly tender over the left upper quadrant.  Pain worse with  abdominal muscle contraction. Negative rebound. No guarding Exts: Non edematous BL  LE, warm and well perfused.   No results found for this or any previous visit (from the past 24 hour(s)). No results found.  Assessment and Plan: 66 y.o. female with abdominal muscle strain.  No signs or symptoms of hernias or intra-abdominal pathology.  Plan: Continue NSAIDs, tramadol as needed for pain additionally.   Heating pad for pain as needed.  Discussed warning signs or symptoms. Please see discharge instructions. Patient expresses understanding. F/u PRN     Rodolph Bong, MD 11/30/12 [phone]

## 2012-12-11 ENCOUNTER — Ambulatory Visit
Admission: RE | Admit: 2012-12-11 | Discharge: 2012-12-11 | Disposition: A | Discharge status: Home / Self Care | Payer: BC Managed Care – PPO | Source: Ambulatory Visit | Attending: Internal Medicine | Admitting: Internal Medicine

## 2012-12-11 DIAGNOSIS — Z1231 Encounter for screening mammogram for malignant neoplasm of breast: Secondary | ICD-10-CM | POA: Diagnosis not present

## 2012-12-27 ENCOUNTER — Encounter (HOSPITAL_COMMUNITY): Payer: Self-pay | Admitting: Emergency Medicine

## 2012-12-27 ENCOUNTER — Emergency Department (INDEPENDENT_AMBULATORY_CARE_PROVIDER_SITE_OTHER)
Admission: EM | Admit: 2012-12-27 | Discharge: 2012-12-27 | Disposition: A | Discharge status: Home / Self Care | Payer: BC Managed Care – PPO | Source: Home / Self Care | Attending: Family Medicine | Admitting: Family Medicine

## 2012-12-27 DIAGNOSIS — N390 Urinary tract infection, site not specified: Secondary | ICD-10-CM

## 2012-12-27 HISTORY — DX: Essential (primary) hypertension: I10

## 2012-12-27 LAB — POCT URINALYSIS DIP (DEVICE)
Hgb urine dipstick: NEGATIVE
Nitrite: NEGATIVE
Urobilinogen, UA: 0.2 mg/dL (ref 0.0–1.0)
pH: 5.5 (ref 5.0–8.0)

## 2012-12-27 MED ORDER — AMOXICILLIN 500 MG PO CAPS: 500.0000 mg | ORAL_CAPSULE | Freq: Three times a day (TID) | ORAL | Status: DC

## 2012-12-27 MED ORDER — FLUCONAZOLE 150 MG PO TABS: 150.0000 mg | ORAL_TABLET | Freq: Once | ORAL | Status: DC

## 2012-12-27 NOTE — Discharge Instructions (Signed)
Urinary Tract Infection Urinary tract infections (UTIs) can develop anywhere along your urinary tract. Your urinary tract is your body's drainage system for removing wastes and extra water. Your urinary tract includes two kidneys, two ureters, a bladder, and a urethra. Your kidneys are a pair of bean-shaped organs. Each kidney is about the size of your fist. They are located below your ribs, one on each side of your spine. CAUSES Infections are caused by microbes, which are microscopic organisms, including fungi, viruses, and bacteria. These organisms are so small that they can only be seen through a microscope. Bacteria are the microbes that most commonly cause UTIs. SYMPTOMS  Symptoms of UTIs may vary by age and gender of the patient and by the location of the infection. Symptoms in young women typically include a frequent and intense urge to urinate and a painful, burning feeling in the bladder or urethra during urination. Older women and men are more likely to be tired, shaky, and weak and have muscle aches and abdominal pain. A fever may mean the infection is in your kidneys. Other symptoms of a kidney infection include pain in your back or sides below the ribs, nausea, and vomiting. DIAGNOSIS To diagnose a UTI, your caregiver will ask you about your symptoms. Your caregiver also will ask to provide a urine sample. The urine sample will be tested for bacteria and white blood cells. White blood cells are made by your body to help fight infection. TREATMENT  Typically, UTIs can be treated with medication. Because most UTIs are caused by a bacterial infection, they usually can be treated with the use of antibiotics. The choice of antibiotic and length of treatment depend on your symptoms and the type of bacteria causing your infection. HOME CARE INSTRUCTIONS  If you were prescribed antibiotics, take them exactly as your caregiver instructs you. Finish the medication even if you feel better after you  have only taken some of the medication.  Drink enough water and fluids to keep your urine clear or pale yellow.  Avoid caffeine, tea, and carbonated beverages. They tend to irritate your bladder.  Empty your bladder often. Avoid holding urine for long periods of time.  Empty your bladder before and after sexual intercourse.  After a bowel movement, women should cleanse from front to back. Use each tissue only once. SEEK MEDICAL CARE IF:   You have back pain.  You develop a fever.  Your symptoms do not begin to resolve within 3 days. SEEK IMMEDIATE MEDICAL CARE IF:   You have severe back pain or lower abdominal pain.  You develop chills.  You have nausea or vomiting.  You have continued burning or discomfort with urination. MAKE SURE YOU:   Understand these instructions.  Will watch your condition.  Will get help right away if you are not doing well or get worse. Document Released: 08/31/2005 Document Revised: 05/22/2012 Document Reviewed: 12/30/2011 [employer] Patient Information 2013 [employer].  

## 2012-12-27 NOTE — ED Notes (Signed)
MD at bedside. 

## 2012-12-27 NOTE — ED Notes (Signed)
Reports burning with urination, frequency, urgency and also complains of uri.  Onset of symptoms 2 days ago.  Took a family members cipro yesterday and today.  Patient reports she is not responding to antibiotic like she normally does: pain has continue instead of resolving within 2 doses.

## 2012-12-27 NOTE — ED Provider Notes (Signed)
Medical screening examination/treatment/procedure(s) were performed by resident physician or non-physician practitioner and as supervising physician I was immediately available for consultation/collaboration.   Darcus Edds DOUGLAS MD.    Damante Spragg D Derhonda Eastlick, MD 12/27/12 1955 

## 2012-12-27 NOTE — ED Provider Notes (Signed)
History     CSN: [id number]  Arrival date & time 12/27/12  1727   None     Chief Complaint  Patient presents with  . Urinary Tract Infection    (Consider location/radiation/quality/duration/timing/severity/associated sxs/prior treatment) Patient is a 67 y.o. female presenting with dysuria. The history is provided by the patient. No language interpreter was used.  Dysuria  This is a new problem. The current episode started 2 days ago. The problem occurs every urination. The problem has been gradually worsening. The quality of the pain is described as burning. The pain is moderate. There has been no fever. There is no history of pyelonephritis. She has tried nothing for the symptoms. Her past medical history is significant for recurrent UTIs.    Past Medical History  Diagnosis Date  . Menopause   . Allergic rhinitis   . Anemia   . GERD (gastroesophageal reflux disease)   . Hypertension     History reviewed. No pertinent past surgical history.  Family History  Problem Relation Age of Onset  . Stroke Mother   . Lung cancer Father     History  Substance Use Topics  . Smoking status: Never Smoker   . Smokeless tobacco: Not on file  . Alcohol Use: No    OB History    Grav Para Term Preterm Abortions TAB SAB Ect Mult Living                  Review of Systems  Genitourinary: Positive for dysuria.  All other systems reviewed and are negative.    Allergies  Benazepril hcl and Levofloxacin  Home Medications   Current Outpatient Rx  Name  Route  Sig  Dispense  Refill  . AMLODIPINE BESYLATE 5 MG PO TABS   Oral   Take 1 tablet (5 mg total) by mouth daily.   90 tablet   3   . CIPROFLOXACIN HCL 500 MG PO TABS   Oral   Take 500 mg by mouth 2 (two) times daily. Left over from family member         . IRON PO   Oral   Take by mouth daily.           [name] OVER THE COUNTER MEDICATION      decongestant         . PANTOPRAZOLE SODIUM 40 MG PO TBEC   Oral  Take 1 tablet (40 mg total) by mouth daily.   90 tablet   3   . ALPRAZOLAM 0.25 MG PO TABS   Oral   Take 1 tablet (0.25 mg total) by mouth 2 (two) times daily as needed for anxiety.   60 tablet   1   . CALCIUM PO   Oral   Take by mouth daily.           . CHOLECALCIFEROL 1000 UNITS PO CAPS   Oral   Take 1,000 Units by mouth daily.           [name] FEXOFENADINE HCL 180 MG PO TABS   Oral   Take 180 mg by mouth daily.           . TRAMADOL HCL 50 MG PO TABS   Oral   Take 1 tablet (50 mg total) by mouth every 6 (six) hours as needed for pain.   30 tablet   0     BP 135/82  Pulse 90  Temp 97.9 F (36.6 C) (Oral)  Resp 20  SpO2 98%  Physical Exam  Nursing note and vitals reviewed. Constitutional: She appears well-developed and well-nourished.  HENT:  Head: Normocephalic and atraumatic.  Eyes: Pupils are equal, round, and reactive to light.  Cardiovascular: Normal rate and normal heart sounds.   Pulmonary/Chest: Effort normal and breath sounds normal.  Abdominal: Soft.  Musculoskeletal: Normal range of motion.  Neurological: She is alert.  Skin: Skin is warm.  Psychiatric: She has a normal mood and affect.    ED Course  Procedures (including critical care time)  Labs Reviewed - No data to display No results found.   1. UTI (lower urinary tract infection)     Pt's urine is normal,  Pt only has dysuria,   Culture ordered  MDM  rx for amoxicillian and diflucan.   Follow up with Dr. Alfonse Flavors, [place] 12/27/12 1859  Lonia Skinner [place], [place] 12/27/12 1904

## 2012-12-29 LAB — URINE CULTURE

## 2013-05-13 ENCOUNTER — Encounter: Payer: Self-pay | Admitting: Internal Medicine

## 2013-05-13 ENCOUNTER — Ambulatory Visit (INDEPENDENT_AMBULATORY_CARE_PROVIDER_SITE_OTHER): Payer: Medicare Other | Admitting: Internal Medicine

## 2013-05-13 VITALS — BP 130/78 | HR 72 | Temp 98.7°F | Resp 16 | Wt 201.0 lb

## 2013-05-13 DIAGNOSIS — F411 Generalized anxiety disorder: Secondary | ICD-10-CM

## 2013-05-13 DIAGNOSIS — K219 Gastro-esophageal reflux disease without esophagitis: Secondary | ICD-10-CM

## 2013-05-13 DIAGNOSIS — I10 Essential (primary) hypertension: Secondary | ICD-10-CM | POA: Diagnosis not present

## 2013-05-13 DIAGNOSIS — F419 Anxiety disorder, unspecified: Secondary | ICD-10-CM

## 2013-05-13 MED ORDER — PANTOPRAZOLE SODIUM 40 MG PO TBEC: 40.0000 mg | DELAYED_RELEASE_TABLET | Freq: Every day | ORAL | Status: DC

## 2013-05-13 MED ORDER — AMLODIPINE BESYLATE 5 MG PO TABS: 5.0000 mg | ORAL_TABLET | Freq: Every day | ORAL | Status: DC

## 2013-05-13 NOTE — Progress Notes (Signed)
  Subjective:    HPI  The patient presents for a follow-up of  chronic hypertension, GERD controlled with medicines C/o stress and insomnia - rare    Review of Systems  Constitutional: Negative for chills, activity change, appetite change, fatigue and unexpected weight change.  HENT: Negative for congestion, mouth sores and sinus pressure.   Eyes: Negative for visual disturbance.  Respiratory: Negative for cough and chest tightness.   Gastrointestinal: Negative for nausea and abdominal pain.  Genitourinary: Negative for frequency, difficulty urinating and vaginal pain.  Musculoskeletal: Negative for back pain and gait problem.  Skin: Negative for pallor and rash.  Neurological: Negative for dizziness, tremors, weakness, numbness and headaches.  Psychiatric/Behavioral: Negative for suicidal ideas, confusion and sleep disturbance. The patient is not nervous/anxious.    Wt Readings from Last 3 Encounters:  05/13/13 201 lb (91.173 kg)  11/12/12 206 lb (93.441 kg)  05/07/12 212 lb (96.163 kg)    BP Readings from Last 3 Encounters:  05/13/13 130/78  12/27/12 135/82  11/30/12 155/73       Objective:   Physical Exam  Constitutional: She appears well-developed. No distress.  obese  HENT:  Head: Normocephalic.  Right Ear: External ear normal.  Left Ear: External ear normal.  Nose: Nose normal.  Mouth/Throat: Oropharynx is clear and moist.  Eyes: Conjunctivae are normal. Pupils are equal, round, and reactive to light. Right eye exhibits no discharge. Left eye exhibits no discharge.  Neck: Normal range of motion. Neck supple. No JVD present. No tracheal deviation present. No thyromegaly present.  Cardiovascular: Normal rate, regular rhythm and normal heart sounds.   Pulmonary/Chest: No stridor. No respiratory distress. She has no wheezes.  Abdominal: Soft. Bowel sounds are normal. She exhibits no distension and no mass. There is no tenderness. There is no rebound and no guarding.   Musculoskeletal: She exhibits no edema and no tenderness.  Lymphadenopathy:    She has no cervical adenopathy.  Neurological: She displays normal reflexes. No cranial nerve deficit. She exhibits normal muscle tone. Coordination normal.  Skin: No rash noted. No erythema.  Psychiatric: She has a normal mood and affect. Her behavior is normal. Judgment and thought content normal.    Lab Results  Component Value Date   WBC 5.4 11/12/2012   HGB 12.9 11/12/2012   HCT 38.8 11/12/2012   PLT 228.0 11/12/2012   GLUCOSE 93 11/12/2012   CHOL 186 11/12/2012   TRIG 145.0 11/12/2012   HDL 50.70 11/12/2012   LDLCALC 106* 11/12/2012   ALT 15 11/12/2012   AST 18 11/12/2012   NA 141 11/12/2012   K 4.1 11/12/2012   CL 106 11/12/2012   CREATININE 0.6 11/12/2012   BUN 9 11/12/2012   CO2 29 11/12/2012   TSH 0.99 11/12/2012         Assessment & Plan:

## 2013-05-13 NOTE — Assessment & Plan Note (Signed)
Continue with current prescription therapy as reflected on the Med list.  

## 2013-05-13 NOTE — Assessment & Plan Note (Signed)
Continue with current prescription prn therapy as reflected on the Med list.  

## 2013-07-30 ENCOUNTER — Encounter: Payer: Self-pay | Admitting: Gastroenterology

## 2013-08-20 ENCOUNTER — Encounter: Payer: Self-pay | Admitting: Gastroenterology

## 2013-09-18 ENCOUNTER — Ambulatory Visit (INDEPENDENT_AMBULATORY_CARE_PROVIDER_SITE_OTHER): Payer: Medicare Other

## 2013-09-18 DIAGNOSIS — Z23 Encounter for immunization: Secondary | ICD-10-CM | POA: Diagnosis not present

## 2013-10-28 ENCOUNTER — Ambulatory Visit (AMBULATORY_SURGERY_CENTER): Payer: Medicare Other

## 2013-10-28 VITALS — Ht 63.0 in | Wt 200.0 lb

## 2013-10-28 DIAGNOSIS — Z860100 Personal history of colon polyps, unspecified: Secondary | ICD-10-CM

## 2013-10-28 MED ORDER — MOVIPREP 100 G PO SOLR: 1.0000 | Freq: Once | ORAL | Status: DC

## 2013-11-11 ENCOUNTER — Ambulatory Visit (AMBULATORY_SURGERY_CENTER): Payer: Medicare Other | Admitting: Gastroenterology

## 2013-11-11 ENCOUNTER — Encounter: Payer: Self-pay | Admitting: Gastroenterology

## 2013-11-11 VITALS — BP 97/51 | HR 70 | Temp 97.1°F | Resp 11 | Ht 63.0 in | Wt 200.0 lb

## 2013-11-11 DIAGNOSIS — D126 Benign neoplasm of colon, unspecified: Secondary | ICD-10-CM

## 2013-11-11 DIAGNOSIS — I10 Essential (primary) hypertension: Secondary | ICD-10-CM | POA: Diagnosis not present

## 2013-11-11 DIAGNOSIS — Z86010 Personal history of colonic polyps: Secondary | ICD-10-CM | POA: Diagnosis not present

## 2013-11-11 DIAGNOSIS — E669 Obesity, unspecified: Secondary | ICD-10-CM | POA: Diagnosis not present

## 2013-11-11 MED ORDER — SODIUM CHLORIDE 0.9 % IV SOLN: 500.0000 mL | INTRAVENOUS | Status: DC

## 2013-11-11 NOTE — Progress Notes (Signed)
Procedure ends, to recovery, report given and VSS. 

## 2013-11-11 NOTE — Progress Notes (Signed)
Called to room to assist during endoscopic procedure.  Patient ID and intended procedure confirmed with present staff. Received instructions for my participation in the procedure from the performing physician. ewm 

## 2013-11-11 NOTE — Op Note (Signed)
[facility] [address] [place], [address]   COLONOSCOPY PROCEDURE REPORT  PATIENT: Diane Sawyer, Diane Sawyer  MR#: [id number] BIRTHDATE: [birth year] , 67  yrs. old GENDER: Female ENDOSCOPIST: Meryl Dare, MD, [facility] PROCEDURE DATE:  11/11/2013 PROCEDURE:   Colonoscopy with snare polypectomy First Screening Colonoscopy - Avg.  risk and is 50 yrs.  old or older - No.  Prior Negative Screening - Now for repeat screening. N/A  History of Adenoma - Now for follow-up colonoscopy & has been > or = to 3 yrs.  Yes hx of adenoma.  Has been 3 or more years since last colonoscopy.  Polyps Removed Today? Yes. ASA CLASS:   Class II INDICATIONS:Patient's personal history of adenomatous colon polyps.  MEDICATIONS: MAC sedation, administered by CRNA and propofol (Diprivan) 250mg  IV DESCRIPTION OF PROCEDURE:   After the risks benefits and alternatives of the procedure were thoroughly explained, informed consent was obtained.  A digital rectal exam revealed no abnormalities of the rectum.   The LB [id number]  endoscope was introduced through the anus and advanced to the cecum, which was identified by both the appendix and ileocecal valve. No adverse events experienced.   The quality of the prep was adequate, using MoviPrep  The instrument was then slowly withdrawn as the colon was fully examined.  COLON FINDINGS: A sessile polyp measuring 6 mm in size was found in the transverse colon.  A polypectomy was performed with a cold snare.  The resection was complete and the polyp tissue was completely retrieved.  Moderate diverticulosis was noted in the sigmoid colon.  The colon was otherwise normal.  There was no diverticulosis, inflammation, polyps or cancers unless previously stated.  Retroflexed views revealed moderate internal hemorrhoids. The time to cecum=4 minutes 09 seconds.  Withdrawal time=9 minutes 52 seconds.  The scope was withdrawn and the procedure  completed.  COMPLICATIONS: There were no complications.  ENDOSCOPIC IMPRESSION: 1.   Sessile polyp measuring 6 mm in the transverse colon; polypectomy performed with a cold snare 2.   Moderate diverticulosis was noted in the sigmoid colon 3.   Moderate internal hemorrhoids  RECOMMENDATIONS: 1.  Await pathology results 2.  High fiber diet with liberal fluid intake. 3.  Repeat Colonoscopy in 5 years.  eSigned:  Meryl Dare, MD, [facility] 11/11/2013 8:32 AM

## 2013-11-11 NOTE — Patient Instructions (Signed)
Discharge instructions given with verbal understanding. Handouts on polyps,diverticulosis and hemorrhoids. Resume previous medications. YOU HAD AN ENDOSCOPIC PROCEDURE TODAY AT THE [facility]: Refer to the procedure report that was given to you for any specific questions about what was found during the examination.  If the procedure report does not answer your questions, please call your gastroenterologist to clarify.  If you requested that your care partner not be given the details of your procedure findings, then the procedure report has been included in a sealed envelope for you to review at your convenience later.  YOU SHOULD EXPECT: Some feelings of bloating in the abdomen. Passage of more gas than usual.  Walking can help get rid of the air that was put into your GI tract during the procedure and reduce the bloating. If you had a lower endoscopy (such as a colonoscopy or flexible sigmoidoscopy) you may notice spotting of blood in your stool or on the toilet paper. If you underwent a bowel prep for your procedure, then you may not have a normal bowel movement for a few days.  DIET: Your first meal following the procedure should be a light meal and then it is ok to progress to your normal diet.  A half-sandwich or bowl of soup is an example of a good first meal.  Heavy or fried foods are harder to digest and may make you feel nauseous or bloated.  Likewise meals heavy in dairy and vegetables can cause extra gas to form and this can also increase the bloating.  Drink plenty of fluids but you should avoid alcoholic beverages for 24 hours.  ACTIVITY: Your care partner should take you home directly after the procedure.  You should plan to take it easy, moving slowly for the rest of the day.  You can resume normal activity the day after the procedure however you should NOT DRIVE or use heavy machinery for 24 hours (because of the sedation medicines used during the test).    SYMPTOMS TO REPORT  IMMEDIATELY: A gastroenterologist can be reached at any hour.  During normal business hours, 8:30 AM to 5:00 PM Monday through Friday, call [phone].  After hours and on weekends, please call the GI answering service at [phone] who will take a message and have the physician on call contact you.   Following lower endoscopy (colonoscopy or flexible sigmoidoscopy):  Excessive amounts of blood in the stool  Significant tenderness or worsening of abdominal pains  Swelling of the abdomen that is new, acute  Fever of 100F or higher  FOLLOW UP: If any biopsies were taken you will be contacted by phone or by letter within the next 1-3 weeks.  Call your gastroenterologist if you have not heard about the biopsies in 3 weeks.  Our staff will call the home number listed on your records the next business day following your procedure to check on you and address any questions or concerns that you may have at that time regarding the information given to you following your procedure. This is a courtesy call and so if there is no answer at the home number and we have not heard from you through the emergency physician on call, we will assume that you have returned to your regular daily activities without incident.  SIGNATURES/CONFIDENTIALITY: You and/or your care partner have signed paperwork which will be entered into your electronic medical record.  These signatures attest to the fact that that the information above on your After Visit Summary   has been reviewed and is understood.  Full responsibility of the confidentiality of this discharge information lies with you and/or your care-partner. 

## 2013-11-11 NOTE — Progress Notes (Signed)
Patient did not experience any of the following events: a burn prior to discharge; a fall within the facility; wrong site/side/patient/procedure/implant event; or a hospital transfer or hospital admission upon discharge from the facility. (G8907) Patient did not have preoperative order for IV antibiotic SSI prophylaxis. (G8918)  

## 2013-11-12 ENCOUNTER — Telehealth: Payer: Self-pay

## 2013-11-12 NOTE — Telephone Encounter (Signed)
Busy signal

## 2013-11-18 ENCOUNTER — Ambulatory Visit (INDEPENDENT_AMBULATORY_CARE_PROVIDER_SITE_OTHER): Payer: Medicare Other | Admitting: Internal Medicine

## 2013-11-18 ENCOUNTER — Other Ambulatory Visit (INDEPENDENT_AMBULATORY_CARE_PROVIDER_SITE_OTHER): Payer: Medicare Other

## 2013-11-18 ENCOUNTER — Encounter: Payer: Self-pay | Admitting: Internal Medicine

## 2013-11-18 VITALS — BP 110/74 | HR 72 | Temp 98.0°F | Resp 16 | Ht 63.5 in | Wt 204.0 lb

## 2013-11-18 DIAGNOSIS — Z0000 Encounter for general adult medical examination without abnormal findings: Secondary | ICD-10-CM | POA: Diagnosis not present

## 2013-11-18 DIAGNOSIS — R9431 Abnormal electrocardiogram [ECG] [EKG]: Secondary | ICD-10-CM | POA: Diagnosis not present

## 2013-11-18 DIAGNOSIS — M25519 Pain in unspecified shoulder: Secondary | ICD-10-CM

## 2013-11-18 DIAGNOSIS — I10 Essential (primary) hypertension: Secondary | ICD-10-CM

## 2013-11-18 DIAGNOSIS — R42 Dizziness and giddiness: Secondary | ICD-10-CM | POA: Insufficient documentation

## 2013-11-18 DIAGNOSIS — R011 Cardiac murmur, unspecified: Secondary | ICD-10-CM | POA: Diagnosis not present

## 2013-11-18 DIAGNOSIS — M25512 Pain in left shoulder: Secondary | ICD-10-CM

## 2013-11-18 LAB — BASIC METABOLIC PANEL
CO2: 28 mEq/L (ref 19–32)
Calcium: 8.9 mg/dL (ref 8.4–10.5)
Chloride: 106 mEq/L (ref 96–112)
Sodium: 140 mEq/L (ref 135–145)

## 2013-11-18 LAB — HEPATIC FUNCTION PANEL
ALT: 13 U/L (ref 0–35)
AST: 15 U/L (ref 0–37)
Albumin: 4.2 g/dL (ref 3.5–5.2)
Alkaline Phosphatase: 67 U/L (ref 39–117)
Total Protein: 6.9 g/dL (ref 6.0–8.3)

## 2013-11-18 LAB — CBC WITH DIFFERENTIAL/PLATELET
Basophils Relative: 0.6 % (ref 0.0–3.0)
Eosinophils Absolute: 0.2 10*3/uL (ref 0.0–0.7)
Hemoglobin: 13 g/dL (ref 12.0–15.0)
Lymphocytes Relative: 28.4 % (ref 12.0–46.0)
MCHC: 33.8 g/dL (ref 30.0–36.0)
Neutro Abs: 3.7 10*3/uL (ref 1.4–7.7)
RBC: 4.28 Mil/uL (ref 3.87–5.11)

## 2013-11-18 LAB — URINALYSIS
Bilirubin Urine: NEGATIVE
Hgb urine dipstick: NEGATIVE
Ketones, ur: NEGATIVE
Urine Glucose: NEGATIVE
Urobilinogen, UA: 0.2 (ref 0.0–1.0)

## 2013-11-18 MED ORDER — AMLODIPINE BESYLATE 5 MG PO TABS: 5.0000 mg | ORAL_TABLET | Freq: Every day | ORAL | Status: DC

## 2013-11-18 MED ORDER — PANTOPRAZOLE SODIUM 40 MG PO TBEC: 40.0000 mg | DELAYED_RELEASE_TABLET | Freq: Every day | ORAL | Status: DC

## 2013-11-18 MED ORDER — ETODOLAC 500 MG PO TABS: 500.0000 mg | ORAL_TABLET | Freq: Two times a day (BID) | ORAL | Status: DC | PRN

## 2013-11-18 NOTE — Assessment & Plan Note (Signed)
BP Readings from Last 3 Encounters:  11/18/13 110/74  11/11/13 97/51  05/13/13 130/78  Try 1/2 tab Norvasc due to orthostatic dizziness - mild

## 2013-11-18 NOTE — Assessment & Plan Note (Signed)
2 D ECHO ordered

## 2013-11-18 NOTE — Progress Notes (Signed)
   Subjective:    HPI The patient is here for a wellness exam. The patient has been doing well overall without major physical or psychological issues going on lately (occ dizziness)  The patient presents for a follow-up of  chronic hypertension, GERD controlled with medicines F/u stress and insomnia - husband had a bypass in the spring of 2013.  Going to [place] in July - [place]    Review of Systems  Constitutional: Negative for chills, activity change, appetite change, fatigue and unexpected weight change.  HENT: Negative for congestion, mouth sores and sinus pressure.   Eyes: Negative for visual disturbance.  Respiratory: Negative for cough and chest tightness.   Gastrointestinal: Negative for nausea and abdominal pain.  Genitourinary: Negative for frequency, difficulty urinating and vaginal pain.  Musculoskeletal: Negative for back pain and gait problem.  Skin: Negative for pallor and rash.  Neurological: Negative for dizziness, tremors, weakness, numbness and headaches.  Psychiatric/Behavioral: Negative for suicidal ideas, confusion and sleep disturbance. The patient is not nervous/anxious.    Wt Readings from Last 3 Encounters:  11/18/13 204 lb (92.534 kg)  11/11/13 200 lb (90.719 kg)  10/28/13 200 lb (90.719 kg)    BP Readings from Last 3 Encounters:  11/18/13 110/74  11/11/13 97/51  05/13/13 130/78       Objective:   Physical Exam  Constitutional: She appears well-developed. No distress.  obese  HENT:  Head: Normocephalic.  Right Ear: External ear normal.  Left Ear: External ear normal.  Nose: Nose normal.  Mouth/Throat: Oropharynx is clear and moist.  Eyes: Conjunctivae are normal. Pupils are equal, round, and reactive to light. Right eye exhibits no discharge. Left eye exhibits no discharge.  Neck: Normal range of motion. Neck supple. No JVD present. No tracheal deviation present. No thyromegaly present.  Cardiovascular: Normal rate and regular  rhythm.   Murmur (1/6 faint murmur) heard. Pulmonary/Chest: No stridor. No respiratory distress. She has no wheezes.  Abdominal: Soft. Bowel sounds are normal. She exhibits no distension and no mass. There is no tenderness. There is no rebound and no guarding.  Musculoskeletal: She exhibits no edema and no tenderness.  Lymphadenopathy:    She has no cervical adenopathy.  Neurological: She displays normal reflexes. No cranial nerve deficit. She exhibits normal muscle tone. Coordination normal.  Skin: No rash noted. No erythema.  Psychiatric: She has a normal mood and affect. Her behavior is normal. Judgment and thought content normal.    Lab Results  Component Value Date   WBC 5.4 11/12/2012   HGB 12.9 11/12/2012   HCT 38.8 11/12/2012   PLT 228.0 11/12/2012   GLUCOSE 93 11/12/2012   CHOL 186 11/12/2012   TRIG 145.0 11/12/2012   HDL 50.70 11/12/2012   LDLCALC 106* 11/12/2012   ALT 15 11/12/2012   AST 18 11/12/2012   NA 141 11/12/2012   K 4.1 11/12/2012   CL 106 11/12/2012   CREATININE 0.6 11/12/2012   BUN 9 11/12/2012   CO2 29 11/12/2012   TSH 0.99 11/12/2012         Assessment & Plan:

## 2013-11-18 NOTE — Assessment & Plan Note (Signed)
Decrease Norvasc to 1/2 tab a day

## 2013-11-18 NOTE — Progress Notes (Signed)
Pre visit review using our clinic review tool, if applicable. No additional management support is needed unless otherwise documented below in the visit note. 

## 2013-11-18 NOTE — Assessment & Plan Note (Addendum)
We discussed age appropriate health related issues, including available/recomended screening tests and vaccinations. We discussed a need for adhering to healthy diet and exercise. Labs/EKG were reviewed/ordered. All questions were answered. [employer] suggested for travel

## 2013-11-18 NOTE — Assessment & Plan Note (Signed)
[place] prn

## 2013-11-19 ENCOUNTER — Encounter: Payer: Self-pay | Admitting: Gastroenterology

## 2013-11-26 ENCOUNTER — Other Ambulatory Visit (INDEPENDENT_AMBULATORY_CARE_PROVIDER_SITE_OTHER): Payer: Medicare Other

## 2013-11-26 DIAGNOSIS — M25519 Pain in unspecified shoulder: Secondary | ICD-10-CM | POA: Diagnosis not present

## 2013-11-26 DIAGNOSIS — R9431 Abnormal electrocardiogram [ECG] [EKG]: Secondary | ICD-10-CM

## 2013-11-26 DIAGNOSIS — Z0000 Encounter for general adult medical examination without abnormal findings: Secondary | ICD-10-CM

## 2013-11-26 DIAGNOSIS — R011 Cardiac murmur, unspecified: Secondary | ICD-10-CM | POA: Diagnosis not present

## 2013-11-26 DIAGNOSIS — M25512 Pain in left shoulder: Secondary | ICD-10-CM

## 2013-11-26 DIAGNOSIS — I10 Essential (primary) hypertension: Secondary | ICD-10-CM

## 2013-11-26 LAB — LIPID PANEL
LDL Cholesterol: 107 mg/dL — ABNORMAL HIGH (ref 0–99)
Total CHOL/HDL Ratio: 3

## 2013-12-12 ENCOUNTER — Other Ambulatory Visit (HOSPITAL_COMMUNITY): Payer: Medicare Other

## 2013-12-16 ENCOUNTER — Ambulatory Visit (HOSPITAL_COMMUNITY): Payer: Medicare Other | Attending: Cardiology | Admitting: Cardiology

## 2013-12-16 ENCOUNTER — Encounter: Payer: Self-pay | Admitting: Cardiology

## 2013-12-16 DIAGNOSIS — E669 Obesity, unspecified: Secondary | ICD-10-CM | POA: Diagnosis not present

## 2013-12-16 DIAGNOSIS — R011 Cardiac murmur, unspecified: Secondary | ICD-10-CM | POA: Diagnosis not present

## 2013-12-16 DIAGNOSIS — R9431 Abnormal electrocardiogram [ECG] [EKG]: Secondary | ICD-10-CM

## 2013-12-16 DIAGNOSIS — I10 Essential (primary) hypertension: Secondary | ICD-10-CM | POA: Diagnosis not present

## 2013-12-16 DIAGNOSIS — Z6836 Body mass index (BMI) 36.0-36.9, adult: Secondary | ICD-10-CM | POA: Diagnosis not present

## 2013-12-16 NOTE — Progress Notes (Signed)
Echo performed. 

## 2014-04-21 DIAGNOSIS — H2510 Age-related nuclear cataract, unspecified eye: Secondary | ICD-10-CM | POA: Diagnosis not present

## 2014-05-12 ENCOUNTER — Ambulatory Visit (INDEPENDENT_AMBULATORY_CARE_PROVIDER_SITE_OTHER): Payer: Medicare Other | Admitting: Internal Medicine

## 2014-05-12 ENCOUNTER — Encounter: Payer: Self-pay | Admitting: Internal Medicine

## 2014-05-12 VITALS — BP 148/70 | HR 72 | Temp 98.5°F | Resp 16 | Wt 205.0 lb

## 2014-05-12 DIAGNOSIS — J309 Allergic rhinitis, unspecified: Secondary | ICD-10-CM | POA: Diagnosis not present

## 2014-05-12 DIAGNOSIS — I10 Essential (primary) hypertension: Secondary | ICD-10-CM

## 2014-05-12 DIAGNOSIS — E559 Vitamin D deficiency, unspecified: Secondary | ICD-10-CM

## 2014-05-12 DIAGNOSIS — F419 Anxiety disorder, unspecified: Secondary | ICD-10-CM

## 2014-05-12 DIAGNOSIS — F411 Generalized anxiety disorder: Secondary | ICD-10-CM

## 2014-05-12 DIAGNOSIS — K219 Gastro-esophageal reflux disease without esophagitis: Secondary | ICD-10-CM | POA: Diagnosis not present

## 2014-05-12 MED ORDER — ALPRAZOLAM 0.25 MG PO TABS: 0.2500 mg | ORAL_TABLET | Freq: Two times a day (BID) | ORAL | Status: DC | PRN

## 2014-05-12 MED ORDER — AMLODIPINE BESYLATE 5 MG PO TABS: 5.0000 mg | ORAL_TABLET | Freq: Every day | ORAL | Status: DC

## 2014-05-12 NOTE — Progress Notes (Signed)
   Subjective:    HPI   The patient has been doing well overall without major physical or psychological issues going on lately (occ dizziness)  The patient presents for a follow-up of  chronic hypertension, GERD controlled with medicines F/u stress and insomnia - husband had a bypass in the spring of 2013.      Review of Systems  Constitutional: Negative for chills, activity change, appetite change, fatigue and unexpected weight change.  HENT: Negative for congestion, mouth sores and sinus pressure.   Eyes: Negative for visual disturbance.  Respiratory: Negative for cough and chest tightness.   Gastrointestinal: Negative for nausea and abdominal pain.  Genitourinary: Negative for frequency, difficulty urinating and vaginal pain.  Musculoskeletal: Negative for back pain and gait problem.  Skin: Negative for pallor and rash.  Neurological: Negative for dizziness, tremors, weakness, numbness and headaches.  Psychiatric/Behavioral: Negative for suicidal ideas, confusion and sleep disturbance. The patient is not nervous/anxious.    Wt Readings from Last 3 Encounters:  05/12/14 205 lb (92.987 kg)  11/18/13 204 lb (92.534 kg)  11/11/13 200 lb (90.719 kg)    BP Readings from Last 3 Encounters:  05/12/14 148/70  11/18/13 110/74  11/11/13 97/51       Objective:   Physical Exam  Constitutional: She appears well-developed. No distress.  obese  HENT:  Head: Normocephalic.  Right Ear: External ear normal.  Left Ear: External ear normal.  Nose: Nose normal.  Mouth/Throat: Oropharynx is clear and moist.  Eyes: Conjunctivae are normal. Pupils are equal, round, and reactive to light. Right eye exhibits no discharge. Left eye exhibits no discharge.  Neck: Normal range of motion. Neck supple. No JVD present. No tracheal deviation present. No thyromegaly present.  Cardiovascular: Normal rate and regular rhythm.   Murmur (1/6 faint murmur) heard. Pulmonary/Chest: No stridor. No  respiratory distress. She has no wheezes.  Abdominal: Soft. Bowel sounds are normal. She exhibits no distension and no mass. There is no tenderness. There is no rebound and no guarding.  Musculoskeletal: She exhibits no edema and no tenderness.  Lymphadenopathy:    She has no cervical adenopathy.  Neurological: She displays normal reflexes. No cranial nerve deficit. She exhibits normal muscle tone. Coordination normal.  Skin: No rash noted. No erythema.  Psychiatric: She has a normal mood and affect. Her behavior is normal. Judgment and thought content normal.    Lab Results  Component Value Date   WBC 6.0 11/18/2013   HGB 13.0 11/18/2013   HCT 38.3 11/18/2013   PLT 237.0 11/18/2013   GLUCOSE 99 11/18/2013   CHOL 181 11/26/2013   TRIG 82.0 11/26/2013   HDL 57.60 11/26/2013   LDLCALC 107* 11/26/2013   ALT 13 11/18/2013   AST 15 11/18/2013   NA 140 11/18/2013   K 3.8 11/18/2013   CL 106 11/18/2013   CREATININE 0.7 11/18/2013   BUN 11 11/18/2013   CO2 28 11/18/2013   TSH 0.91 11/18/2013         Assessment & Plan:

## 2014-05-12 NOTE — Assessment & Plan Note (Addendum)
Flonase Netty pot

## 2014-05-12 NOTE — Assessment & Plan Note (Signed)
Continue with current prescription therapy as reflected on the Med list.  

## 2014-05-12 NOTE — Assessment & Plan Note (Signed)
Better on 1/2 tab

## 2014-05-12 NOTE — Progress Notes (Signed)
Pre visit review using our clinic review tool, if applicable. No additional management support is needed unless otherwise documented below in the visit note. 

## 2014-05-13 ENCOUNTER — Telehealth: Payer: Self-pay | Admitting: Internal Medicine

## 2014-05-13 NOTE — Telephone Encounter (Signed)
Relevant patient education assigned to patient using Emmi. ° °

## 2014-05-19 ENCOUNTER — Ambulatory Visit: Payer: Medicare Other | Admitting: Internal Medicine

## 2014-06-03 DIAGNOSIS — R21 Rash and other nonspecific skin eruption: Secondary | ICD-10-CM | POA: Diagnosis not present

## 2014-06-03 DIAGNOSIS — IMO0001 Reserved for inherently not codable concepts without codable children: Secondary | ICD-10-CM | POA: Diagnosis not present

## 2014-06-04 ENCOUNTER — Emergency Department (INDEPENDENT_AMBULATORY_CARE_PROVIDER_SITE_OTHER)
Admission: EM | Admit: 2014-06-04 | Discharge: 2014-06-04 | Disposition: A | Discharge status: Home / Self Care | Payer: Medicare Other | Source: Home / Self Care | Attending: Family Medicine | Admitting: Family Medicine

## 2014-06-04 ENCOUNTER — Encounter (HOSPITAL_COMMUNITY): Payer: Self-pay | Admitting: Emergency Medicine

## 2014-06-04 DIAGNOSIS — B354 Tinea corporis: Secondary | ICD-10-CM | POA: Diagnosis not present

## 2014-06-04 MED ORDER — TERBINAFINE HCL 1 % EX CREA: 1.0000 "application " | TOPICAL_CREAM | Freq: Two times a day (BID) | CUTANEOUS | Status: DC

## 2014-06-04 NOTE — Discharge Instructions (Signed)

## 2014-06-04 NOTE — ED Notes (Signed)
Pt c/o rash on right lower extremity onset 1 week [facility] Rx Doxycycline 100mg  and Triamcinolone cream 0.1% Denies f/v/n/d, cold sx Alert w/no signs of acute distress.

## 2014-06-04 NOTE — ED Provider Notes (Signed)
CSN: [id number]     Arrival date & time 06/04/14  1925 History   First MD Initiated Contact with Patient 06/04/14 2006     Chief Complaint  Patient presents with  . Rash   (Consider location/radiation/quality/duration/timing/severity/associated sxs/prior Treatment) HPI Comments: Was seen for same at [facility] and prescribed oral doxycycline and triamcinolone cream. States redness has improved but she feel lesion is a ringworm and questions whether or not she should be taking oral doxycycline.   Patient is a 68 y.o. female presenting with rash. The history is provided by the patient.  Rash Location:  Leg Leg rash location:  R upper leg Quality: itchiness and redness   Severity:  Mild Onset quality:  Gradual Duration:  1 week Timing:  Constant Progression:  Improving Chronicity:  New Relieved by:  Topical steroids   Past Medical History  Diagnosis Date  . Menopause   . Allergic rhinitis   . Anemia   . GERD (gastroesophageal reflux disease)   . Hypertension    Past Surgical History  Procedure Laterality Date  . No past surgeries     Family History  Problem Relation Age of Onset  . Stroke Mother 4  . Lung cancer Father 69  . Colon cancer Neg Hx    History  Substance Use Topics  . Smoking status: Never Smoker   . Smokeless tobacco: Not on file  . Alcohol Use: No   OB History   Grav Para Term Preterm Abortions TAB SAB Ect Mult Living                 Review of Systems  Skin: Positive for rash.  All other systems reviewed and are negative.   Allergies  Benazepril hcl and Levofloxacin  Home Medications   Prior to Admission medications   Medication Sig Start Date End Date Taking? Authorizing Provider  amLODipine (NORVASC) 5 MG tablet Take 1 tablet (5 mg total) by mouth daily. 05/12/14  Yes Aleksei Plotnikov V, MD  IRON PO Take by mouth daily.     Yes Historical Provider, MD  pantoprazole (PROTONIX) 40 MG tablet Take 1 tablet (40 mg total) by mouth daily.  11/18/13 11/18/14 Yes Aleksei Plotnikov V, MD  ALPRAZolam (XANAX) 0.25 MG tablet Take 1 tablet (0.25 mg total) by mouth 2 (two) times daily as needed for anxiety. 05/12/14   Aleksei Plotnikov V, MD  Cholecalciferol 1000 UNITS capsule Take 1,000 Units by mouth daily.      Historical Provider, MD  etodolac (LODINE) 500 MG tablet Take 1 tablet (500 mg total) by mouth 2 (two) times daily as needed. 11/18/13   Aleksei Plotnikov V, MD  fexofenadine (ALLEGRA) 180 MG tablet Take 180 mg by mouth daily.      Historical Provider, MD  terbinafine (LAMISIL AT) 1 % cream Apply 1 application topically 2 (two) times daily. To affected areas x 14 days 06/04/14   Annett Gula Eula Jaster, PA   BP 136/72  Pulse 72  Temp(Src) 98 F (36.7 C) (Oral)  Resp 18  SpO2 100% Physical Exam  Nursing note and vitals reviewed. Constitutional: She is oriented to person, place, and time. She appears well-developed and well-nourished. No distress.  obese  HENT:  Head: Normocephalic and atraumatic.  Eyes: Conjunctivae are normal. No scleral icterus.  Cardiovascular: Normal rate.   Pulmonary/Chest: Effort normal.  Neurological: She is alert and oriented to person, place, and time.  Skin: Skin is warm and dry. Rash noted.  Annular erythematous macule 3 cm  in diameter with fine white scale at slightly raised border  Psychiatric: She has a normal mood and affect. Her behavior is normal.    ED Course  Procedures (including critical care time) Labs Review Labs Reviewed - No data to display  Imaging Review No results found.   MDM   1. Tinea corporis   Discontinue doxycycline. Continue triamcinolone. Add Lamisil cream x 14 days. PCP follow up if no improvement.     [place], [place] 06/04/14 2218

## 2014-06-06 NOTE — ED Provider Notes (Signed)
Medical screening examination/treatment/procedure(s) were performed by resident physician or non-physician practitioner and as supervising physician I was immediately available for consultation/collaboration.   Pauline Good MD.   Billy Fischer, MD 06/06/14 [phone]

## 2014-06-09 ENCOUNTER — Telehealth: Payer: Self-pay | Admitting: Internal Medicine

## 2014-06-09 NOTE — Telephone Encounter (Signed)
Received 3 pages from [facility], sent to Dr. Alain Marion. 06/09/14/ss.

## 2014-07-23 DIAGNOSIS — H02059 Trichiasis without entropian unspecified eye, unspecified eyelid: Secondary | ICD-10-CM | POA: Diagnosis not present

## 2014-07-23 DIAGNOSIS — H15119 Episcleritis periodica fugax, unspecified eye: Secondary | ICD-10-CM | POA: Diagnosis not present

## 2014-08-01 DIAGNOSIS — N390 Urinary tract infection, site not specified: Secondary | ICD-10-CM | POA: Diagnosis not present

## 2014-08-06 ENCOUNTER — Telehealth: Payer: Self-pay | Admitting: Internal Medicine

## 2014-08-06 NOTE — Telephone Encounter (Signed)
[facility] Forwarding 4 pages to Dr.Plotnikov

## 2014-08-12 ENCOUNTER — Emergency Department (INDEPENDENT_AMBULATORY_CARE_PROVIDER_SITE_OTHER)
Admission: EM | Admit: 2014-08-12 | Discharge: 2014-08-12 | Disposition: A | Discharge status: Home / Self Care | Payer: Medicare Other | Source: Home / Self Care | Attending: Family Medicine | Admitting: Family Medicine

## 2014-08-12 ENCOUNTER — Encounter (HOSPITAL_COMMUNITY): Payer: Self-pay | Admitting: Emergency Medicine

## 2014-08-12 DIAGNOSIS — L989 Disorder of the skin and subcutaneous tissue, unspecified: Secondary | ICD-10-CM | POA: Diagnosis not present

## 2014-08-12 DIAGNOSIS — R238 Other skin changes: Secondary | ICD-10-CM

## 2014-08-12 MED ORDER — TRIAMCINOLONE ACETONIDE 0.147 MG/GM EX AERS: INHALATION_SPRAY | CUTANEOUS | Status: DC

## 2014-08-12 NOTE — ED Notes (Signed)
C/o scalp itching onset 2 weeks ago.  She had tinea on her leg on 6/30 and was treated with Lamisil.  She can't see anything because her hair is thick.

## 2014-08-12 NOTE — ED Provider Notes (Signed)
Diane Sawyer is a 68 y.o. female who presents to Urgent Care today for scalp itching. Patient has a few week history of scalp itching. About 2 or 3 months ago she was treated with Lamisil cream for tinea corporis. She is inspected her hair and does not feel that she has tinea pedis concerned about the persistent itching. She has tried different kinds or shampoos which have not helped. No fevers or chills nausea vomiting or diarrhea.   Past Medical History  Diagnosis Date  . Menopause   . Allergic rhinitis   . Anemia   . GERD (gastroesophageal reflux disease)   . Hypertension    History  Substance Use Topics  . Smoking status: Never Smoker   . Smokeless tobacco: Not on file  . Alcohol Use: No   ROS as above Medications: No current facility-administered medications for this encounter.   Current Outpatient Prescriptions  Medication Sig Dispense Refill  . ALPRAZolam (XANAX) 0.25 MG tablet Take 1 tablet (0.25 mg total) by mouth 2 (two) times daily as needed for anxiety.  60 tablet  3  . amLODipine (NORVASC) 5 MG tablet Take 1 tablet (5 mg total) by mouth daily.  90 tablet  3  . Cholecalciferol 1000 UNITS capsule Take 1,000 Units by mouth daily.        . fexofenadine (ALLEGRA) 180 MG tablet Take 180 mg by mouth daily.        . IRON PO Take by mouth daily.        . pantoprazole (PROTONIX) 40 MG tablet Take 1 tablet (40 mg total) by mouth daily.  90 tablet  3  . terbinafine (LAMISIL AT) 1 % cream Apply 1 application topically 2 (two) times daily. To affected areas x 14 days  30 g  0  . etodolac (LODINE) 500 MG tablet Take 1 tablet (500 mg total) by mouth 2 (two) times daily as needed.  60 tablet  3  . triamcinolone (KENALOG) topical spray Apply to scalp 2-3 times daily as needed for itching  63 g  1    Exam:  Gen: Well NAD Scalp: Normal scalp. No scalp lesions   No results found for this or any previous visit (from the past 24 hour(s)). No results found.  Assessment and Plan: 68  y.o. female with pruritic scalp. Unclear etiology. Trial of triamcinolone aerosol spray. Follow up with dermatology if not better.  Discussed warning signs or symptoms. Please see discharge instructions. Patient expresses understanding.   This note was created using Systems analyst. Any transcription errors are unintended.    Gregor Hams, MD 08/12/14 2001

## 2014-08-12 NOTE — Discharge Instructions (Signed)
Thank you for coming in today. Use the triamcinolone spray 2-3 times daily as needed for itching.  If not getting better followup with dermatology

## 2014-09-18 ENCOUNTER — Ambulatory Visit (INDEPENDENT_AMBULATORY_CARE_PROVIDER_SITE_OTHER): Payer: Medicare Other | Admitting: Family Medicine

## 2014-09-18 VITALS — BP 142/72 | HR 100 | Temp 98.7°F | Resp 20 | Ht 63.5 in | Wt 207.0 lb

## 2014-09-18 DIAGNOSIS — R21 Rash and other nonspecific skin eruption: Secondary | ICD-10-CM

## 2014-09-18 DIAGNOSIS — T148 Other injury of unspecified body region: Secondary | ICD-10-CM

## 2014-09-18 DIAGNOSIS — W57XXXA Bitten or stung by nonvenomous insect and other nonvenomous arthropods, initial encounter: Secondary | ICD-10-CM

## 2014-09-18 LAB — POCT CBC
Granulocyte percent: 65 %G (ref 37–80)
HEMATOCRIT: 42.8 % (ref 37.7–47.9)
Hemoglobin: 13.6 g/dL (ref 12.2–16.2)
Lymph, poc: 2.4 (ref 0.6–3.4)
MCH: 30.3 pg (ref 27–31.2)
MCHC: 31.9 g/dL (ref 31.8–35.4)
MCV: 95 fL (ref 80–97)
MID (CBC): 0.5 (ref 0–0.9)
MPV: 8.2 fL (ref 0–99.8)
PLATELET COUNT, POC: 266 10*3/uL (ref 142–424)
POC Granulocyte: 5.5 (ref 2–6.9)
POC LYMPH %: 28.8 % (ref 10–50)
POC MID %: 6.2 %M (ref 0–12)
RBC: 4.5 M/uL (ref 4.04–5.48)
RDW, POC: 15.2 %
WBC: 8.5 10*3/uL (ref 4.6–10.2)

## 2014-09-18 MED ORDER — TRIAMCINOLONE ACETONIDE 0.1 % EX CREA: 1.0000 "application " | TOPICAL_CREAM | Freq: Two times a day (BID) | CUTANEOUS | Status: DC

## 2014-09-18 NOTE — Progress Notes (Signed)
Subjective:    Patient ID: Diane Sawyer, female    DOB: 1946-10-04, 68 y.o.   MRN: [id number] This chart was scribed for Reginia Forts, MD by Marti Sleigh, Medical Scribe. This patient was seen in Room 1 and the patient's care was started at 8:39 PM.  HPI HPI Comments: Diane Sawyer is a 68 y.o. female who presents to [facility] complaining of a rash on her bilateral feet and right lateral thigh that she originally noticed four months ago. She endorses pruritus and erythema as associated symptoms. Pt states that she went to an urgent care for her symptoms when they initially appeared and was prescribed oral abx/doxycycline, antifungal cream, and steroid cream. Pt states that she has not been taking her oral abx. Pt states she has been applying antifungal cream, and steroid cream to her thigh, bilateral feet, and right lateral thigh with some relief of symptoms. Pt also states she has been applying both creams anywhere she suspects she is developing a new rash. Pt states that she noticed her rash immediately before a mission trip to [place] . Pt states that she is allergic to elastic, and receives a rash when she wears new underwear. Pt also states that she sometimes gets a rash under her bra line.  She is very concerned by the persistence of the rashes.  The rash has not spread significantly from onset.  No interdigit involvement or axillary involvement; no genital area or groin area involvement.    Review of Systems  Constitutional: Negative for fever, chills, diaphoresis and fatigue.  HENT: Negative for facial swelling.   Respiratory: Negative for shortness of breath.   Skin: Positive for color change and rash. Negative for pallor and wound.  Psychiatric/Behavioral: Negative for agitation. The patient is nervous/anxious.        Objective:   Physical Exam  Nursing note and vitals reviewed. Constitutional: She is oriented to person, place, and time. She appears well-developed and well-nourished.    HENT:  Head: Normocephalic and atraumatic.  Eyes: Pupils are equal, round, and reactive to light.  Neck: No JVD present.  Cardiovascular: Normal rate and regular rhythm.   Pulmonary/Chest: Effort normal and breath sounds normal. No respiratory distress.  Neurological: She is alert and oriented to person, place, and time.  Skin: Skin is warm and dry. Rash noted.  Varicose veins bilateral LE. +Prominent vasculature beds B feet bilaterally. +Scaling on bilateral feet. Non-blanching rash R dorsal foot, and discrete lesions on medial aspect of proximal lower legs B  non-tender.  Psychiatric: She has a normal mood and affect. Her behavior is normal.   Results for orders placed in visit on 09/18/14  POCT CBC      Result Value Ref Range   WBC 8.5  4.6 - 10.2 K/uL   Lymph, poc 2.4  0.6 - 3.4   POC LYMPH PERCENT 28.8  10 - 50 %L   MID (cbc) 0.5  0 - 0.9   POC MID % 6.2  0 - 12 %M   POC Granulocyte 5.5  2 - 6.9   Granulocyte percent 65.0  37 - 80 %G   RBC 4.50  4.04 - 5.48 M/uL   Hemoglobin 13.6  12.2 - 16.2 g/dL   HCT, POC 42.8  37.7 - 47.9 %   MCV 95.0  80 - 97 fL   MCH, POC 30.3  27 - 31.2 pg   MCHC 31.9  31.8 - 35.4 g/dL   RDW, POC 15.2  Platelet Count, POC 266  142 - 424 K/uL   MPV 8.2  0 - 99.8 fL       Assessment & Plan:  1. Insect bite -New.  Rash located B lower extremities without spread.  Distribution and appearance consistent with insect bites; rx for Triamcinolone cream provided; also recommend oral antihistamine daily.   - POCT CBC - triamcinolone cream (KENALOG) 0.1 %; Apply 1 application topically 2 (two) times daily.  Dispense: 45 g; Refill: 0  2. Rash -New to this provider; onset in past four months; two varying rashes; one rash with discrete blanching lesions and one non-blanching rash lower extremities very mild.  CBC normal.  Reassurance provided.  No suggestion of scabies, shingles, urticaria. - POCT CBC - triamcinolone cream (KENALOG) 0.1 %; Apply 1  application topically 2 (two) times daily.  Dispense: 45 g; Refill: 0  I personally performed the services described in this documentation, which was scribed in my presence.  The recorded information has been reviewed and is accurate.  Reginia Forts, M.D.  [facility] [address] [place], [place]  [address] [phone] phone [phone] fax

## 2014-09-18 NOTE — Patient Instructions (Signed)
1.You can stop Lotrimin cream; there is no evidence of a tinea or fungal infection. 2.  Start taking Triamcinolone cream twice daily to feet and legs.   3.  You are not contagious.

## 2014-10-06 ENCOUNTER — Ambulatory Visit (INDEPENDENT_AMBULATORY_CARE_PROVIDER_SITE_OTHER): Payer: Medicare Other | Admitting: Internal Medicine

## 2014-10-06 ENCOUNTER — Encounter: Payer: Self-pay | Admitting: Internal Medicine

## 2014-10-06 VITALS — BP 112/76 | HR 81 | Temp 98.4°F | Resp 14 | Wt 206.2 lb

## 2014-10-06 DIAGNOSIS — B369 Superficial mycosis, unspecified: Secondary | ICD-10-CM | POA: Diagnosis not present

## 2014-10-06 MED ORDER — KETOCONAZOLE 2 % EX CREA: 1.0000 "application " | TOPICAL_CREAM | Freq: Two times a day (BID) | CUTANEOUS | Status: DC

## 2014-10-06 NOTE — Progress Notes (Signed)
   Subjective:    Patient ID: Diane Sawyer, female    DOB: Sep 01, 1946, 68 y.o.   MRN: [id number]  HPI Since June she's been seen at the [facility] & the urgent care a total of 3 occasions to treat a rash over the right lateral thigh. It presented as an itchy rash with some faint hyperpigmentation.  Initially triamcinolone and doxycycline was prescribed. She only took a doxycycline one day as [place]  Subsequently she was prescribed Lotrimin AF with minimal response. Finally she used her husband's generic Nizoral which has been effective.  She developed faint hyperpigmentation at the base of the left second toe. This was diagnoses as stasis dermatitis.  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    Review of Systems   She has chronic  itchy, watery eyes which is  unassociated.  Swelling of the lips or tongue denied.  Shortness of breath, wheezing, or cough absent.  Fever ,chills , or sweats denied. Purulence absent.  Diarrhea not present.     Objective:   Physical Exam  Positive or pertinent findings:  There is very faint erythema over the right lateral posterior thigh which is difficult to discern This is similar, bland slightly more hyperpigmented area  measuring 1 x 1 cm at the base of the left second toe She has some fungal toenail changes mainly of the left foot. Central weight accesses present.   General appearance :adequately nourished; in no distress. Eyes: No conjunctival inflammation or scleral icterus is present. Oral exam: Dental hygiene is good. Lips and gums are healthy appearing.There is no oropharyngeal erythema or exudate noted.  Heart:  Normal rate and regular rhythm. S1 and S2 normal without gallop, murmur, click, rub or other extra sounds   Lungs:Chest clear to auscultation; no wheezes, rhonchi,rales ,or rubs present.No increased work of breathing.  Abdomen: bowel sounds  normal, soft and non-tender without masses, organomegaly or hernias noted.  No guarding or rebound.  Vascular : all pulses equal ; no bruits present. Skin:Warm & dry.  Intact without suspicious lesions ; no jaundice or tenting Lymphatic: No lymphadenopathy is noted about the head, neck, axilla            Assessment & Plan:  #34fungal dermatitis  Plan: See orders and recommendations

## 2014-10-06 NOTE — Progress Notes (Signed)
Pre visit review using our clinic review tool, if applicable. No additional management support is needed unless otherwise documented below in the visit note. 

## 2014-10-06 NOTE — Patient Instructions (Signed)
After showering  use a hairdryer to blow the nails dry. Also do this after any activities which cause sweating. Change socks repeatedly through the day if the feet do sweat. Tennis shoes or other foot wear which appear somewhat moldy should be discarded.

## 2014-10-21 ENCOUNTER — Other Ambulatory Visit: Payer: Self-pay

## 2014-10-21 DIAGNOSIS — Z1231 Encounter for screening mammogram for malignant neoplasm of breast: Secondary | ICD-10-CM

## 2014-11-10 ENCOUNTER — Other Ambulatory Visit (INDEPENDENT_AMBULATORY_CARE_PROVIDER_SITE_OTHER): Payer: Medicare Other

## 2014-11-10 ENCOUNTER — Ambulatory Visit (INDEPENDENT_AMBULATORY_CARE_PROVIDER_SITE_OTHER): Payer: Medicare Other | Admitting: *Deleted

## 2014-11-10 ENCOUNTER — Ambulatory Visit (INDEPENDENT_AMBULATORY_CARE_PROVIDER_SITE_OTHER): Payer: Medicare Other | Admitting: Internal Medicine

## 2014-11-10 ENCOUNTER — Encounter: Payer: Self-pay | Admitting: Internal Medicine

## 2014-11-10 VITALS — BP 122/80 | HR 75 | Temp 97.6°F | Resp 20 | Ht 63.5 in | Wt 207.0 lb

## 2014-11-10 DIAGNOSIS — Z23 Encounter for immunization: Secondary | ICD-10-CM

## 2014-11-10 DIAGNOSIS — E785 Hyperlipidemia, unspecified: Secondary | ICD-10-CM

## 2014-11-10 DIAGNOSIS — R21 Rash and other nonspecific skin eruption: Secondary | ICD-10-CM

## 2014-11-10 DIAGNOSIS — I10 Essential (primary) hypertension: Secondary | ICD-10-CM

## 2014-11-10 DIAGNOSIS — Z0000 Encounter for general adult medical examination without abnormal findings: Secondary | ICD-10-CM

## 2014-11-10 DIAGNOSIS — F419 Anxiety disorder, unspecified: Secondary | ICD-10-CM

## 2014-11-10 LAB — BASIC METABOLIC PANEL
BUN: 12 mg/dL (ref 6–23)
CHLORIDE: 104 meq/L (ref 96–112)
CO2: 27 meq/L (ref 19–32)
Calcium: 9.3 mg/dL (ref 8.4–10.5)
Creatinine, Ser: 0.7 mg/dL (ref 0.4–1.2)
GFR: 84.43 mL/min (ref >60.00)
Glucose, Bld: 83 mg/dL (ref 70–99)
Potassium: 4.3 mEq/L (ref 3.5–5.1)
SODIUM: 139 meq/L (ref 135–145)

## 2014-11-10 LAB — CBC WITH DIFFERENTIAL/PLATELET
BASOS ABS: 0 10*3/uL (ref 0.0–0.1)
Basophils Relative: 0.6 % (ref 0.0–3.0)
Eosinophils Absolute: 0.2 10*3/uL (ref 0.0–0.7)
Eosinophils Relative: 3.1 % (ref 0.0–5.0)
HEMATOCRIT: 41.2 % (ref 36.0–46.0)
HEMOGLOBIN: 13.7 g/dL (ref 12.0–15.0)
LYMPHS ABS: 2.1 10*3/uL (ref 0.7–4.0)
Lymphocytes Relative: 30.9 % (ref 12.0–46.0)
MCHC: 33.2 g/dL (ref 30.0–36.0)
MCV: 90.6 fl (ref 78.0–100.0)
MONO ABS: 0.5 10*3/uL (ref 0.1–1.0)
MONOS PCT: 6.8 % (ref 3.0–12.0)
Neutro Abs: 4.1 10*3/uL (ref 1.4–7.7)
Neutrophils Relative %: 58.6 % (ref 43.0–77.0)
Platelets: 259 10*3/uL (ref 150.0–400.0)
RBC: 4.54 Mil/uL (ref 3.87–5.11)
RDW: 13.3 % (ref 11.5–15.5)
WBC: 6.9 10*3/uL (ref 4.0–10.5)

## 2014-11-10 LAB — LIPID PANEL
Cholesterol: 199 mg/dL (ref 0–200)
HDL: 61 mg/dL (ref >39.00)
LDL Cholesterol: 119 mg/dL — ABNORMAL HIGH (ref 0–99)
NONHDL: 138
Total CHOL/HDL Ratio: 3
Triglycerides: 97 mg/dL (ref 0.0–149.0)
VLDL: 19.4 mg/dL (ref 0.0–40.0)

## 2014-11-10 LAB — URINALYSIS
Bilirubin Urine: NEGATIVE
Hgb urine dipstick: NEGATIVE
KETONES UR: NEGATIVE
LEUKOCYTES UA: NEGATIVE
Nitrite: NEGATIVE
SPECIFIC GRAVITY, URINE: 1.01 (ref 1.000–1.030)
Total Protein, Urine: NEGATIVE
UROBILINOGEN UA: 0.2 (ref 0.0–1.0)
Urine Glucose: NEGATIVE
pH: 7 (ref 5.0–8.0)

## 2014-11-10 LAB — HEPATIC FUNCTION PANEL
ALT: 12 U/L (ref 0–35)
AST: 17 U/L (ref 0–37)
Albumin: 4.2 g/dL (ref 3.5–5.2)
Alkaline Phosphatase: 77 U/L (ref 39–117)
Bilirubin, Direct: 0.1 mg/dL (ref 0.0–0.3)
TOTAL PROTEIN: 7.2 g/dL (ref 6.0–8.3)
Total Bilirubin: 0.6 mg/dL (ref 0.2–1.2)

## 2014-11-10 LAB — TSH: TSH: 1.16 u[IU]/mL (ref 0.35–4.50)

## 2014-11-10 NOTE — Progress Notes (Signed)
Pre visit review using our clinic review tool, if applicable. No additional management support is needed unless otherwise documented below in the visit note. 

## 2014-11-10 NOTE — Assessment & Plan Note (Signed)
Continue with current prescription therapy as reflected on the Med list.  

## 2014-11-10 NOTE — Patient Instructions (Signed)
Preventive Care for Adults A healthy lifestyle and preventive care can promote health and wellness. Preventive health guidelines for women include the following key practices.  A routine yearly physical is a good way to check with your health care provider about your health and preventive screening. It is a chance to share any concerns and updates on your health and to receive a thorough exam.  Visit your dentist for a routine exam and preventive care every 6 months. Brush your teeth twice a day and floss once a day. Good oral hygiene prevents tooth decay and gum disease.  The frequency of eye exams is based on your age, health, family medical history, use of contact lenses, and other factors. Follow your health care provider's recommendations for frequency of eye exams.  Eat a healthy diet. Foods like vegetables, fruits, whole grains, low-fat dairy products, and lean protein foods contain the nutrients you need without too many calories. Decrease your intake of foods high in solid fats, added sugars, and salt. Eat the right amount of calories for you.Get information about a proper diet from your health care provider, if necessary.  Regular physical exercise is one of the most important things you can do for your health. Most adults should get at least 150 minutes of moderate-intensity exercise (any activity that increases your heart rate and causes you to sweat) each week. In addition, most adults need muscle-strengthening exercises on 2 or more days a week.  Maintain a healthy weight. The body mass index (BMI) is a screening tool to identify possible weight problems. It provides an estimate of body fat based on height and weight. Your health care provider can find your BMI and can help you achieve or maintain a healthy weight.For adults 20 years and older:  A BMI below 18.5 is considered underweight.  A BMI of 18.5 to 24.9 is normal.  A BMI of 25 to 29.9 is considered overweight.  A BMI of  30 and above is considered obese.  Maintain normal blood lipids and cholesterol levels by exercising and minimizing your intake of saturated fat. Eat a balanced diet with plenty of fruit and vegetables. Blood tests for lipids and cholesterol should begin at age 76 and be repeated every 5 years. If your lipid or cholesterol levels are high, you are over 50, or you are at high risk for heart disease, you may need your cholesterol levels checked more frequently.Ongoing high lipid and cholesterol levels should be treated with medicines if diet and exercise are not working.  If you smoke, find out from your health care provider how to quit. If you do not use tobacco, do not start.  Lung cancer screening is recommended for adults aged 22-80 years who are at high risk for developing lung cancer because of a history of smoking. A yearly low-dose CT scan of the lungs is recommended for people who have at least a 30-pack-year history of smoking and are a current smoker or have quit within the past 15 years. A pack year of smoking is smoking an average of 1 pack of cigarettes a day for 1 year (for example: 1 pack a day for 30 years or 2 packs a day for 15 years). Yearly screening should continue until the smoker has stopped smoking for at least 15 years. Yearly screening should be stopped for people who develop a health problem that would prevent them from having lung cancer treatment.  If you are pregnant, do not drink alcohol. If you are breastfeeding,  be very cautious about drinking alcohol. If you are not pregnant and choose to drink alcohol, do not have more than 1 drink per day. One drink is considered to be 12 ounces (355 mL) of beer, 5 ounces (148 mL) of wine, or 1.5 ounces (44 mL) of liquor.  Avoid use of street drugs. Do not share needles with anyone. Ask for help if you need support or instructions about stopping the use of drugs.  High blood pressure causes heart disease and increases the risk of  stroke. Your blood pressure should be checked at least every 1 to 2 years. Ongoing high blood pressure should be treated with medicines if weight loss and exercise do not work.  If you are 3-86 years old, ask your health care provider if you should take aspirin to prevent strokes.  Diabetes screening involves taking a blood sample to check your fasting blood sugar level. This should be done once every 3 years, after age 67, if you are within normal weight and without risk factors for diabetes. Testing should be considered at a younger age or be carried out more frequently if you are overweight and have at least 1 risk factor for diabetes.  Breast cancer screening is essential preventive care for women. You should practice "breast self-awareness." This means understanding the normal appearance and feel of your breasts and may include breast self-examination. Any changes detected, no matter how small, should be reported to a health care provider. Women in their 8s and 30s should have a clinical breast exam (CBE) by a health care provider as part of a regular health exam every 1 to 3 years. After age 70, women should have a CBE every year. Starting at age 25, women should consider having a mammogram (breast X-ray test) every year. Women who have a family history of breast cancer should talk to their health care provider about genetic screening. Women at a high risk of breast cancer should talk to their health care providers about having an MRI and a mammogram every year.  Breast cancer gene (BRCA)-related cancer risk assessment is recommended for women who have family members with BRCA-related cancers. BRCA-related cancers include breast, ovarian, tubal, and peritoneal cancers. Having family members with these cancers may be associated with an increased risk for harmful changes (mutations) in the breast cancer genes BRCA1 and BRCA2. Results of the assessment will determine the need for genetic counseling and  BRCA1 and BRCA2 testing.  Routine pelvic exams to screen for cancer are no longer recommended for nonpregnant women who are considered low risk for cancer of the pelvic organs (ovaries, uterus, and vagina) and who do not have symptoms. Ask your health care provider if a screening pelvic exam is right for you.  If you have had past treatment for cervical cancer or a condition that could lead to cancer, you need Pap tests and screening for cancer for at least 20 years after your treatment. If Pap tests have been discontinued, your risk factors (such as having a new sexual partner) need to be reassessed to determine if screening should be resumed. Some women have medical problems that increase the chance of getting cervical cancer. In these cases, your health care provider may recommend more frequent screening and Pap tests.  The HPV test is an additional test that may be used for cervical cancer screening. The HPV test looks for the virus that can cause the cell changes on the cervix. The cells collected during the Pap test can be  tested for HPV. The HPV test could be used to screen women aged 30 years and older, and should be used in women of any age who have unclear Pap test results. After the age of 30, women should have HPV testing at the same frequency as a Pap test.  Colorectal cancer can be detected and often prevented. Most routine colorectal cancer screening begins at the age of 50 years and continues through age 75 years. However, your health care provider may recommend screening at an earlier age if you have risk factors for colon cancer. On a yearly basis, your health care provider may provide home test kits to check for hidden blood in the stool. Use of a small camera at the end of a tube, to directly examine the colon (sigmoidoscopy or colonoscopy), can detect the earliest forms of colorectal cancer. Talk to your health care provider about this at age 50, when routine screening begins. Direct  exam of the colon should be repeated every 5-10 years through age 75 years, unless early forms of pre-cancerous polyps or small growths are found.  People who are at an increased risk for hepatitis B should be screened for this virus. You are considered at high risk for hepatitis B if:  You were born in a country where hepatitis B occurs often. Talk with your health care provider about which countries are considered high risk.  Your parents were born in a high-risk country and you have not received a shot to protect against hepatitis B (hepatitis B vaccine).  You have HIV or AIDS.  You use needles to inject street drugs.  You live with, or have sex with, someone who has hepatitis B.  You get hemodialysis treatment.  You take certain medicines for conditions like cancer, organ transplantation, and autoimmune conditions.  Hepatitis C blood testing is recommended for all people born from 1945 through 1965 and any individual with known risks for hepatitis C.  Practice safe sex. Use condoms and avoid high-risk sexual practices to reduce the spread of sexually transmitted infections (STIs). STIs include gonorrhea, chlamydia, syphilis, trichomonas, herpes, HPV, and human immunodeficiency virus (HIV). Herpes, HIV, and HPV are viral illnesses that have no cure. They can result in disability, cancer, and death.  You should be screened for sexually transmitted illnesses (STIs) including gonorrhea and chlamydia if:  You are sexually active and are younger than 24 years.  You are older than 24 years and your health care provider tells you that you are at risk for this type of infection.  Your sexual activity has changed since you were last screened and you are at an increased risk for chlamydia or gonorrhea. Ask your health care provider if you are at risk.  If you are at risk of being infected with HIV, it is recommended that you take a prescription medicine daily to prevent HIV infection. This is  called preexposure prophylaxis (PrEP). You are considered at risk if:  You are a heterosexual woman, are sexually active, and are at increased risk for HIV infection.  You take drugs by injection.  You are sexually active with a partner who has HIV.  Talk with your health care provider about whether you are at high risk of being infected with HIV. If you choose to begin PrEP, you should first be tested for HIV. You should then be tested every 3 months for as long as you are taking PrEP.  Osteoporosis is a disease in which the bones lose minerals and strength   with aging. This can result in serious bone fractures or breaks. The risk of osteoporosis can be identified using a bone density scan. Women ages 65 years and over and women at risk for fractures or osteoporosis should discuss screening with their health care providers. Ask your health care provider whether you should take a calcium supplement or vitamin D to reduce the rate of osteoporosis.  Menopause can be associated with physical symptoms and risks. Hormone replacement therapy is available to decrease symptoms and risks. You should talk to your health care provider about whether hormone replacement therapy is right for you.  Use sunscreen. Apply sunscreen liberally and repeatedly throughout the day. You should seek shade when your shadow is shorter than you. Protect yourself by wearing long sleeves, pants, a wide-brimmed hat, and sunglasses year round, whenever you are outdoors.  Once a month, do a whole body skin exam, using a mirror to look at the skin on your back. Tell your health care provider of new moles, moles that have irregular borders, moles that are larger than a pencil eraser, or moles that have changed in shape or color.  Stay current with required vaccines (immunizations).  Influenza vaccine. All adults should be immunized every year.  Tetanus, diphtheria, and acellular pertussis (Td, Tdap) vaccine. Pregnant women should  receive 1 dose of Tdap vaccine during each pregnancy. The dose should be obtained regardless of the length of time since the last dose. Immunization is preferred during the 27th-36th week of gestation. An adult who has not previously received Tdap or who does not know her vaccine status should receive 1 dose of Tdap. This initial dose should be followed by tetanus and diphtheria toxoids (Td) booster doses every 10 years. Adults with an unknown or incomplete history of completing a 3-dose immunization series with Td-containing vaccines should begin or complete a primary immunization series including a Tdap dose. Adults should receive a Td booster every 10 years.  Varicella vaccine. An adult without evidence of immunity to varicella should receive 2 doses or a second dose if she has previously received 1 dose. Pregnant females who do not have evidence of immunity should receive the first dose after pregnancy. This first dose should be obtained before leaving the health care facility. The second dose should be obtained 4-8 weeks after the first dose.  Human papillomavirus (HPV) vaccine. Females aged 13-26 years who have not received the vaccine previously should obtain the 3-dose series. The vaccine is not recommended for use in pregnant females. However, pregnancy testing is not needed before receiving a dose. If a female is found to be pregnant after receiving a dose, no treatment is needed. In that case, the remaining doses should be delayed until after the pregnancy. Immunization is recommended for any person with an immunocompromised condition through the age of 26 years if she did not get any or all doses earlier. During the 3-dose series, the second dose should be obtained 4-8 weeks after the first dose. The third dose should be obtained 24 weeks after the first dose and 16 weeks after the second dose.  Zoster vaccine. One dose is recommended for adults aged 60 years or older unless certain conditions are  present.  Measles, mumps, and rubella (MMR) vaccine. Adults born before 1957 generally are considered immune to measles and mumps. Adults born in 1957 or later should have 1 or more doses of MMR vaccine unless there is a contraindication to the vaccine or there is laboratory evidence of immunity to   each of the three diseases. A routine second dose of MMR vaccine should be obtained at least 28 days after the first dose for students attending postsecondary schools, health care workers, or international travelers. People who received inactivated measles vaccine or an unknown type of measles vaccine during 1963-1967 should receive 2 doses of MMR vaccine. People who received inactivated mumps vaccine or an unknown type of mumps vaccine before 1979 and are at high risk for mumps infection should consider immunization with 2 doses of MMR vaccine. For females of childbearing age, rubella immunity should be determined. If there is no evidence of immunity, females who are not pregnant should be vaccinated. If there is no evidence of immunity, females who are pregnant should delay immunization until after pregnancy. Unvaccinated health care workers born before 1957 who lack laboratory evidence of measles, mumps, or rubella immunity or laboratory confirmation of disease should consider measles and mumps immunization with 2 doses of MMR vaccine or rubella immunization with 1 dose of MMR vaccine.  Pneumococcal 13-valent conjugate (PCV13) vaccine. When indicated, a person who is uncertain of her immunization history and has no record of immunization should receive the PCV13 vaccine. An adult aged 19 years or older who has certain medical conditions and has not been previously immunized should receive 1 dose of PCV13 vaccine. This PCV13 should be followed with a dose of pneumococcal polysaccharide (PPSV23) vaccine. The PPSV23 vaccine dose should be obtained at least 8 weeks after the dose of PCV13 vaccine. An adult aged 19  years or older who has certain medical conditions and previously received 1 or more doses of PPSV23 vaccine should receive 1 dose of PCV13. The PCV13 vaccine dose should be obtained 1 or more years after the last PPSV23 vaccine dose.  Pneumococcal polysaccharide (PPSV23) vaccine. When PCV13 is also indicated, PCV13 should be obtained first. All adults aged 65 years and older should be immunized. An adult younger than age 65 years who has certain medical conditions should be immunized. Any person who resides in a nursing home or long-term care facility should be immunized. An adult smoker should be immunized. People with an immunocompromised condition and certain other conditions should receive both PCV13 and PPSV23 vaccines. People with human immunodeficiency virus (HIV) infection should be immunized as soon as possible after diagnosis. Immunization during chemotherapy or radiation therapy should be avoided. Routine use of PPSV23 vaccine is not recommended for American Indians, [place] Natives, or people younger than 65 years unless there are medical conditions that require PPSV23 vaccine. When indicated, people who have unknown immunization and have no record of immunization should receive PPSV23 vaccine. One-time revaccination 5 years after the first dose of PPSV23 is recommended for people aged 19-64 years who have chronic kidney failure, nephrotic syndrome, asplenia, or immunocompromised conditions. People who received 1-2 doses of PPSV23 before age 65 years should receive another dose of PPSV23 vaccine at age 65 years or later if at least 5 years have passed since the previous dose. Doses of PPSV23 are not needed for people immunized with PPSV23 at or after age 65 years.  Meningococcal vaccine. Adults with asplenia or persistent complement component deficiencies should receive 2 doses of quadrivalent meningococcal conjugate (MenACWY-D) vaccine. The doses should be obtained at least 2 months apart.  Microbiologists working with certain meningococcal bacteria, military recruits, people at risk during an outbreak, and people who travel to or live in countries with a high rate of meningitis should be immunized. A first-year college student up through age   21 years who is living in a residence hall should receive a dose if she did not receive a dose on or after her 16th birthday. Adults who have certain high-risk conditions should receive one or more doses of vaccine.  Hepatitis A vaccine. Adults who wish to be protected from this disease, have certain high-risk conditions, work with hepatitis A-infected animals, work in hepatitis A research labs, or travel to or work in countries with a high rate of hepatitis A should be immunized. Adults who were previously unvaccinated and who anticipate close contact with an international adoptee during the first 60 days after arrival in the [place] from a country with a high rate of hepatitis A should be immunized.  Hepatitis B vaccine. Adults who wish to be protected from this disease, have certain high-risk conditions, may be exposed to blood or other infectious body fluids, are household contacts or sex partners of hepatitis B positive people, are clients or workers in certain care facilities, or travel to or work in countries with a high rate of hepatitis B should be immunized.  Haemophilus influenzae type b (Hib) vaccine. A previously unvaccinated person with asplenia or sickle cell disease or having a scheduled splenectomy should receive 1 dose of Hib vaccine. Regardless of previous immunization, a recipient of a hematopoietic stem cell transplant should receive a 3-dose series 6-12 months after her successful transplant. Hib vaccine is not recommended for adults with HIV infection. Preventive Services / Frequency Ages 64 to 68 years  Blood pressure check.** / Every 1 to 2 years.  Lipid and cholesterol check.** / Every 5 years beginning at age  22.  Clinical breast exam.** / Every 3 years for women in their 88s and 53s.  BRCA-related cancer risk assessment.** / For women who have family members with a BRCA-related cancer (breast, ovarian, tubal, or peritoneal cancers).  Pap test.** / Every 2 years from ages 90 through 51. Every 3 years starting at age 21 through age 56 or 3 with a history of 3 consecutive normal Pap tests.  HPV screening.** / Every 3 years from ages 24 through ages 1 to 46 with a history of 3 consecutive normal Pap tests.  Hepatitis C blood test.** / For any individual with known risks for hepatitis C.  Skin self-exam. / Monthly.  Influenza vaccine. / Every year.  Tetanus, diphtheria, and acellular pertussis (Tdap, Td) vaccine.** / Consult your health care provider. Pregnant women should receive 1 dose of Tdap vaccine during each pregnancy. 1 dose of Td every 10 years.  Varicella vaccine.** / Consult your health care provider. Pregnant females who do not have evidence of immunity should receive the first dose after pregnancy.  HPV vaccine. / 3 doses over 6 months, if 72 and younger. The vaccine is not recommended for use in pregnant females. However, pregnancy testing is not needed before receiving a dose.  Measles, mumps, rubella (MMR) vaccine.** / You need at least 1 dose of MMR if you were born in 1957 or later. You may also need a 2nd dose. For females of childbearing age, rubella immunity should be determined. If there is no evidence of immunity, females who are not pregnant should be vaccinated. If there is no evidence of immunity, females who are pregnant should delay immunization until after pregnancy.  Pneumococcal 13-valent conjugate (PCV13) vaccine.** / Consult your health care provider.  Pneumococcal polysaccharide (PPSV23) vaccine.** / 1 to 2 doses if you smoke cigarettes or if you have certain conditions.  Meningococcal vaccine.** /  1 dose if you are age 19 to 21 years and a first-year college  student living in a residence hall, or have one of several medical conditions, you need to get vaccinated against meningococcal disease. You may also need additional booster doses.  Hepatitis A vaccine.** / Consult your health care provider.  Hepatitis B vaccine.** / Consult your health care provider.  Haemophilus influenzae type b (Hib) vaccine.** / Consult your health care provider. Ages 40 to 64 years  Blood pressure check.** / Every 1 to 2 years.  Lipid and cholesterol check.** / Every 5 years beginning at age 20 years.  Lung cancer screening. / Every year if you are aged 55-80 years and have a 30-pack-year history of smoking and currently smoke or have quit within the past 15 years. Yearly screening is stopped once you have quit smoking for at least 15 years or develop a health problem that would prevent you from having lung cancer treatment.  Clinical breast exam.** / Every year after age 40 years.  BRCA-related cancer risk assessment.** / For women who have family members with a BRCA-related cancer (breast, ovarian, tubal, or peritoneal cancers).  Mammogram.** / Every year beginning at age 40 years and continuing for as long as you are in good health. Consult with your health care provider.  Pap test.** / Every 3 years starting at age 30 years through age 65 or 70 years with a history of 3 consecutive normal Pap tests.  HPV screening.** / Every 3 years from ages 30 years through ages 65 to 70 years with a history of 3 consecutive normal Pap tests.  Fecal occult blood test (FOBT) of stool. / Every year beginning at age 50 years and continuing until age 75 years. You may not need to do this test if you get a colonoscopy every 10 years.  Flexible sigmoidoscopy or colonoscopy.** / Every 5 years for a flexible sigmoidoscopy or every 10 years for a colonoscopy beginning at age 50 years and continuing until age 75 years.  Hepatitis C blood test.** / For all people born from 1945 through  1965 and any individual with known risks for hepatitis C.  Skin self-exam. / Monthly.  Influenza vaccine. / Every year.  Tetanus, diphtheria, and acellular pertussis (Tdap/Td) vaccine.** / Consult your health care provider. Pregnant women should receive 1 dose of Tdap vaccine during each pregnancy. 1 dose of Td every 10 years.  Varicella vaccine.** / Consult your health care provider. Pregnant females who do not have evidence of immunity should receive the first dose after pregnancy.  Zoster vaccine.** / 1 dose for adults aged 60 years or older.  Measles, mumps, rubella (MMR) vaccine.** / You need at least 1 dose of MMR if you were born in 1957 or later. You may also need a 2nd dose. For females of childbearing age, rubella immunity should be determined. If there is no evidence of immunity, females who are not pregnant should be vaccinated. If there is no evidence of immunity, females who are pregnant should delay immunization until after pregnancy.  Pneumococcal 13-valent conjugate (PCV13) vaccine.** / Consult your health care provider.  Pneumococcal polysaccharide (PPSV23) vaccine.** / 1 to 2 doses if you smoke cigarettes or if you have certain conditions.  Meningococcal vaccine.** / Consult your health care provider.  Hepatitis A vaccine.** / Consult your health care provider.  Hepatitis B vaccine.** / Consult your health care provider.  Haemophilus influenzae type b (Hib) vaccine.** / Consult your health care provider. Ages 65   years and over  Blood pressure check.** / Every 1 to 2 years.  Lipid and cholesterol check.** / Every 5 years beginning at age 22 years.  Lung cancer screening. / Every year if you are aged 73-80 years and have a 30-pack-year history of smoking and currently smoke or have quit within the past 15 years. Yearly screening is stopped once you have quit smoking for at least 15 years or develop a health problem that would prevent you from having lung cancer  treatment.  Clinical breast exam.** / Every year after age 4 years.  BRCA-related cancer risk assessment.** / For women who have family members with a BRCA-related cancer (breast, ovarian, tubal, or peritoneal cancers).  Mammogram.** / Every year beginning at age 40 years and continuing for as long as you are in good health. Consult with your health care provider.  Pap test.** / Every 3 years starting at age 9 years through age 34 or 91 years with 3 consecutive normal Pap tests. Testing can be stopped between 65 and 70 years with 3 consecutive normal Pap tests and no abnormal Pap or HPV tests in the past 10 years.  HPV screening.** / Every 3 years from ages 57 years through ages 64 or 45 years with a history of 3 consecutive normal Pap tests. Testing can be stopped between 65 and 70 years with 3 consecutive normal Pap tests and no abnormal Pap or HPV tests in the past 10 years.  Fecal occult blood test (FOBT) of stool. / Every year beginning at age 15 years and continuing until age 17 years. You may not need to do this test if you get a colonoscopy every 10 years.  Flexible sigmoidoscopy or colonoscopy.** / Every 5 years for a flexible sigmoidoscopy or every 10 years for a colonoscopy beginning at age 86 years and continuing until age 71 years.  Hepatitis C blood test.** / For all people born from [birth year] through 1964 and any individual with known risks for hepatitis C.  Osteoporosis screening.** / A one-time screening for women ages 83 years and over and women at risk for fractures or osteoporosis.  Skin self-exam. / Monthly.  Influenza vaccine. / Every year.  Tetanus, diphtheria, and acellular pertussis (Tdap/Td) vaccine.** / 1 dose of Td every 10 years.  Varicella vaccine.** / Consult your health care provider.  Zoster vaccine.** / 1 dose for adults aged 61 years or older.  Pneumococcal 13-valent conjugate (PCV13) vaccine.** / Consult your health care provider.  Pneumococcal  polysaccharide (PPSV23) vaccine.** / 1 dose for all adults aged 28 years and older.  Meningococcal vaccine.** / Consult your health care provider.  Hepatitis A vaccine.** / Consult your health care provider.  Hepatitis B vaccine.** / Consult your health care provider.  Haemophilus influenzae type b (Hib) vaccine.** / Consult your health care provider. ** Family history and personal history of risk and conditions may change your health care provider's recommendations. Document Released: 01/17/2002 Document Revised: 04/07/2014 Document Reviewed: 04/18/2011 [facility] Patient Information 2015 [place], [place]. This information is not intended to replace advice given to you by your health care provider. Make sure you discuss any questions you have with your health care provider.

## 2014-11-10 NOTE — Assessment & Plan Note (Signed)
Summer 2015 - tinea vs fixed drug rash vs other. Resolved RTC if re-appeared

## 2014-11-10 NOTE — Progress Notes (Signed)
   Subjective:    HPI The patient is here for a wellness exam. The patient has been doing well overall without major physical or psychological issues going on lately (occ dizziness).   Co rash on R thigh before her mission trip in summer: steroids and anti-fungals did not help..idiopathic thrombocytopenic purpura disappeared and then came back...  The patient presents for a follow-up of  chronic hypertension, GERD controlled with medicines F/u stress and insomnia       Review of Systems  Constitutional: Negative for chills, activity change, appetite change, fatigue and unexpected weight change.  HENT: Negative for congestion, mouth sores and sinus pressure.   Eyes: Negative for visual disturbance.  Respiratory: Negative for cough and chest tightness.   Gastrointestinal: Negative for nausea and abdominal pain.  Genitourinary: Negative for frequency, difficulty urinating and vaginal pain.  Musculoskeletal: Negative for back pain and gait problem.  Skin: Negative for pallor and rash.  Neurological: Negative for dizziness, tremors, weakness, numbness and headaches.  Psychiatric/Behavioral: Negative for suicidal ideas, confusion and sleep disturbance. The patient is not nervous/anxious.    Wt Readings from Last 3 Encounters:  11/10/14 207 lb (93.895 kg)  10/06/14 206 lb 4 oz (93.554 kg)  09/18/14 207 lb (93.895 kg)    BP Readings from Last 3 Encounters:  11/10/14 122/80  10/06/14 112/76  09/18/14 142/72       Objective:   Physical Exam  Constitutional: She appears well-developed. No distress.  HENT:  Head: Normocephalic.  Right Ear: External ear normal.  Left Ear: External ear normal.  Nose: Nose normal.  Mouth/Throat: Oropharynx is clear and moist.  Eyes: Conjunctivae are normal. Pupils are equal, round, and reactive to light. Right eye exhibits no discharge. Left eye exhibits no discharge.  Neck: Normal range of motion. Neck supple. No JVD present. No tracheal  deviation present. No thyromegaly present.  Cardiovascular: Normal rate, regular rhythm and normal heart sounds.   Pulmonary/Chest: No stridor. No respiratory distress. She has no wheezes.  Abdominal: Soft. Bowel sounds are normal. She exhibits no distension and no mass. There is no tenderness. There is no rebound and no guarding.  Musculoskeletal: She exhibits no edema or tenderness.  Lymphadenopathy:    She has no cervical adenopathy.  Neurological: She displays normal reflexes. No cranial nerve deficit. She exhibits normal muscle tone. Coordination normal.  Skin: No rash noted. No erythema.  Psychiatric: She has a normal mood and affect. Her behavior is normal. Judgment and thought content normal.  No rash on R LE  Lab Results  Component Value Date   WBC 8.5 09/18/2014   HGB 13.6 09/18/2014   HCT 42.8 09/18/2014   PLT 237.0 11/18/2013   GLUCOSE 99 11/18/2013   CHOL 181 11/26/2013   TRIG 82.0 11/26/2013   HDL 57.60 11/26/2013   LDLCALC 107* 11/26/2013   ALT 13 11/18/2013   AST 15 11/18/2013   NA 140 11/18/2013   K 3.8 11/18/2013   CL 106 11/18/2013   CREATININE 0.7 11/18/2013   BUN 11 11/18/2013   CO2 28 11/18/2013   TSH 0.91 11/18/2013         Assessment & Plan:

## 2014-11-10 NOTE — Assessment & Plan Note (Signed)
We discussed age appropriate health related issues, including available/recomended screening tests and vaccinations. We discussed a need for adhering to healthy diet and exercise. Labs/EKG were reviewed/ordered. All questions were answered.   

## 2014-11-10 NOTE — Assessment & Plan Note (Signed)
Continue with current prn prescription therapy as reflected on the Med list.  Potential benefits of a long term benzodiazepines  use as well as potential risks  and complications were explained to the patient and were aknowledged.   

## 2014-11-11 ENCOUNTER — Ambulatory Visit
Admission: RE | Admit: 2014-11-11 | Discharge: 2014-11-11 | Disposition: A | Discharge status: Home / Self Care | Payer: Medicare Other | Source: Ambulatory Visit

## 2014-11-11 DIAGNOSIS — Z1231 Encounter for screening mammogram for malignant neoplasm of breast: Secondary | ICD-10-CM

## 2014-12-14 ENCOUNTER — Other Ambulatory Visit: Payer: Self-pay | Admitting: Internal Medicine

## 2015-01-12 ENCOUNTER — Other Ambulatory Visit: Payer: Self-pay | Admitting: Internal Medicine

## 2015-01-12 DIAGNOSIS — R21 Rash and other nonspecific skin eruption: Secondary | ICD-10-CM

## 2015-02-16 DIAGNOSIS — L299 Pruritus, unspecified: Secondary | ICD-10-CM | POA: Diagnosis not present

## 2015-02-16 DIAGNOSIS — D239 Other benign neoplasm of skin, unspecified: Secondary | ICD-10-CM | POA: Diagnosis not present

## 2015-03-23 DIAGNOSIS — L299 Pruritus, unspecified: Secondary | ICD-10-CM | POA: Diagnosis not present

## 2015-03-23 DIAGNOSIS — L309 Dermatitis, unspecified: Secondary | ICD-10-CM | POA: Diagnosis not present

## 2015-05-14 ENCOUNTER — Other Ambulatory Visit: Payer: Self-pay | Admitting: Internal Medicine

## 2015-05-18 ENCOUNTER — Ambulatory Visit (INDEPENDENT_AMBULATORY_CARE_PROVIDER_SITE_OTHER): Payer: Medicare Other | Admitting: Internal Medicine

## 2015-05-18 ENCOUNTER — Encounter: Payer: Self-pay | Admitting: Internal Medicine

## 2015-05-18 VITALS — BP 142/80 | HR 73 | Wt 210.0 lb

## 2015-05-18 DIAGNOSIS — K219 Gastro-esophageal reflux disease without esophagitis: Secondary | ICD-10-CM | POA: Diagnosis not present

## 2015-05-18 DIAGNOSIS — F419 Anxiety disorder, unspecified: Secondary | ICD-10-CM

## 2015-05-18 DIAGNOSIS — Z0000 Encounter for general adult medical examination without abnormal findings: Secondary | ICD-10-CM

## 2015-05-18 DIAGNOSIS — I10 Essential (primary) hypertension: Secondary | ICD-10-CM

## 2015-05-18 MED ORDER — PANTOPRAZOLE SODIUM 40 MG PO TBEC: 40.0000 mg | DELAYED_RELEASE_TABLET | Freq: Every day | ORAL | Status: DC

## 2015-05-18 MED ORDER — AMLODIPINE BESYLATE 5 MG PO TABS: ORAL_TABLET | ORAL | Status: DC

## 2015-05-18 MED ORDER — ALPRAZOLAM 0.25 MG PO TABS: 0.2500 mg | ORAL_TABLET | Freq: Two times a day (BID) | ORAL | Status: DC | PRN

## 2015-05-18 NOTE — Assessment & Plan Note (Signed)
2013 Xanax prn  Potential benefits of a long term benzodiazepines  use as well as potential risks  and complications were explained to the patient and were aknowledged.

## 2015-05-18 NOTE — Assessment & Plan Note (Signed)
On amlodipine.

## 2015-05-18 NOTE — Progress Notes (Signed)
   Subjective:    HPI  The patient has been doing well overall without major physical or psychological issues going on lately (occ dizziness)  The patient presents for a follow-up of  chronic hypertension, GERD controlled with medicine  F/u stress and insomnia - better    Review of Systems  Constitutional: Negative for chills, activity change, appetite change, fatigue and unexpected weight change.  HENT: Negative for congestion, mouth sores and sinus pressure.   Eyes: Negative for visual disturbance.  Respiratory: Negative for cough and chest tightness.   Gastrointestinal: Negative for nausea and abdominal pain.  Genitourinary: Negative for frequency, difficulty urinating and vaginal pain.  Musculoskeletal: Negative for back pain and gait problem.  Skin: Negative for pallor and rash.  Neurological: Negative for dizziness, tremors, weakness, numbness and headaches.  Psychiatric/Behavioral: Negative for suicidal ideas, confusion and sleep disturbance. The patient is not nervous/anxious.    Wt Readings from Last 3 Encounters:  05/18/15 210 lb (95.255 kg)  11/10/14 207 lb (93.895 kg)  10/06/14 206 lb 4 oz (93.554 kg)    BP Readings from Last 3 Encounters:  05/18/15 142/80  11/10/14 122/80  10/06/14 112/76       Objective:   Physical Exam  Constitutional: She appears well-developed. No distress.  obese  HENT:  Head: Normocephalic.  Right Ear: External ear normal.  Left Ear: External ear normal.  Nose: Nose normal.  Mouth/Throat: Oropharynx is clear and moist.  Eyes: Conjunctivae are normal. Pupils are equal, round, and reactive to light. Right eye exhibits no discharge. Left eye exhibits no discharge.  Neck: Normal range of motion. Neck supple. No JVD present. No tracheal deviation present. No thyromegaly present.  Cardiovascular: Normal rate and regular rhythm.   Murmur (1/6 faint murmur) heard. Pulmonary/Chest: No stridor. No respiratory distress. She has no wheezes.   Abdominal: Soft. Bowel sounds are normal. She exhibits no distension and no mass. There is no tenderness. There is no rebound and no guarding.  Musculoskeletal: She exhibits no edema or tenderness.  Lymphadenopathy:    She has no cervical adenopathy.  Neurological: She displays normal reflexes. No cranial nerve deficit. She exhibits normal muscle tone. Coordination normal.  Skin: No rash noted. No erythema.  Psychiatric: She has a normal mood and affect. Her behavior is normal. Judgment and thought content normal.    Lab Results  Component Value Date   WBC 6.9 11/10/2014   HGB 13.7 11/10/2014   HCT 41.2 11/10/2014   PLT 259.0 11/10/2014   GLUCOSE 83 11/10/2014   CHOL 199 11/10/2014   TRIG 97.0 11/10/2014   HDL 61.00 11/10/2014   LDLCALC 119* 11/10/2014   ALT 12 11/10/2014   AST 17 11/10/2014   NA 139 11/10/2014   K 4.3 11/10/2014   CL 104 11/10/2014   CREATININE 0.7 11/10/2014   BUN 12 11/10/2014   CO2 27 11/10/2014   TSH 1.16 11/10/2014         Assessment & Plan:

## 2015-05-18 NOTE — Assessment & Plan Note (Signed)
On Protonix 

## 2015-05-18 NOTE — Progress Notes (Signed)
Pre visit review using our clinic review tool, if applicable. No additional management support is needed unless otherwise documented below in the visit note. 

## 2015-05-28 ENCOUNTER — Encounter: Payer: Self-pay | Admitting: Gastroenterology

## 2015-08-17 ENCOUNTER — Telehealth: Payer: Self-pay | Admitting: Internal Medicine

## 2015-08-17 NOTE — Telephone Encounter (Signed)
Patient is requesting note to get out of jury duty.  Placing info on [employer].

## 2015-08-18 NOTE — Telephone Encounter (Signed)
Letter given to PCP.

## 2015-08-24 ENCOUNTER — Telehealth: Payer: Self-pay | Admitting: *Deleted

## 2015-08-24 ENCOUNTER — Encounter: Payer: Self-pay | Admitting: *Deleted

## 2015-08-24 NOTE — Telephone Encounter (Signed)
Jury duty excuse letter upfront for p/u. Pt informed

## 2015-11-05 ENCOUNTER — Other Ambulatory Visit: Payer: Self-pay

## 2015-11-05 DIAGNOSIS — Z1231 Encounter for screening mammogram for malignant neoplasm of breast: Secondary | ICD-10-CM

## 2015-11-09 ENCOUNTER — Other Ambulatory Visit (INDEPENDENT_AMBULATORY_CARE_PROVIDER_SITE_OTHER): Payer: Medicare Other

## 2015-11-09 DIAGNOSIS — Z0000 Encounter for general adult medical examination without abnormal findings: Secondary | ICD-10-CM | POA: Diagnosis not present

## 2015-11-09 DIAGNOSIS — I10 Essential (primary) hypertension: Secondary | ICD-10-CM

## 2015-11-09 DIAGNOSIS — K219 Gastro-esophageal reflux disease without esophagitis: Secondary | ICD-10-CM | POA: Diagnosis not present

## 2015-11-09 DIAGNOSIS — F419 Anxiety disorder, unspecified: Secondary | ICD-10-CM | POA: Diagnosis not present

## 2015-11-09 LAB — URINALYSIS
Hgb urine dipstick: NEGATIVE
KETONES UR: NEGATIVE
Leukocytes, UA: NEGATIVE
Nitrite: NEGATIVE
PH: 7 (ref 5.0–8.0)
Specific Gravity, Urine: 1.02 (ref 1.000–1.030)
TOTAL PROTEIN, URINE-UPE24: NEGATIVE
Urine Glucose: NEGATIVE
Urobilinogen, UA: 0.2 (ref 0.0–1.0)

## 2015-11-09 LAB — CBC WITH DIFFERENTIAL/PLATELET
Basophils Absolute: 0 10*3/uL (ref 0.0–0.1)
Basophils Relative: 0.8 % (ref 0.0–3.0)
Eosinophils Absolute: 0.2 10*3/uL (ref 0.0–0.7)
Eosinophils Relative: 2.7 % (ref 0.0–5.0)
HCT: 42 % (ref 36.0–46.0)
Hemoglobin: 14 g/dL (ref 12.0–15.0)
LYMPHS ABS: 1.3 10*3/uL (ref 0.7–4.0)
Lymphocytes Relative: 22.7 % (ref 12.0–46.0)
MCHC: 33.2 g/dL (ref 30.0–36.0)
MCV: 89.8 fl (ref 78.0–100.0)
MONO ABS: 0.4 10*3/uL (ref 0.1–1.0)
Monocytes Relative: 6.1 % (ref 3.0–12.0)
NEUTROS PCT: 67.7 % (ref 43.0–77.0)
Neutro Abs: 3.9 10*3/uL (ref 1.4–7.7)
Platelets: 251 10*3/uL (ref 150.0–400.0)
RBC: 4.68 Mil/uL (ref 3.87–5.11)
RDW: 13.3 % (ref 11.5–15.5)
WBC: 5.7 10*3/uL (ref 4.0–10.5)

## 2015-11-09 LAB — LIPID PANEL
Cholesterol: 170 mg/dL (ref 0–200)
HDL: 52.8 mg/dL (ref >39.00)
LDL CALC: 98 mg/dL (ref 0–99)
NONHDL: 117.23
Total CHOL/HDL Ratio: 3
Triglycerides: 97 mg/dL (ref 0.0–149.0)
VLDL: 19.4 mg/dL (ref 0.0–40.0)

## 2015-11-09 LAB — HEPATIC FUNCTION PANEL
ALT: 9 U/L (ref 0–35)
AST: 13 U/L (ref 0–37)
Albumin: 4.1 g/dL (ref 3.5–5.2)
Alkaline Phosphatase: 79 U/L (ref 39–117)
BILIRUBIN TOTAL: 0.5 mg/dL (ref 0.2–1.2)
Bilirubin, Direct: 0.1 mg/dL (ref 0.0–0.3)
Total Protein: 7.1 g/dL (ref 6.0–8.3)

## 2015-11-09 LAB — BASIC METABOLIC PANEL
BUN: 10 mg/dL (ref 6–23)
CALCIUM: 9.2 mg/dL (ref 8.4–10.5)
CO2: 28 mEq/L (ref 19–32)
Chloride: 107 mEq/L (ref 96–112)
Creatinine, Ser: 0.68 mg/dL (ref 0.40–1.20)
GFR: 91.36 mL/min (ref >60.00)
GLUCOSE: 92 mg/dL (ref 70–99)
Potassium: 4.1 mEq/L (ref 3.5–5.1)
SODIUM: 143 meq/L (ref 135–145)

## 2015-11-09 LAB — TSH: TSH: 0.87 u[IU]/mL (ref 0.35–4.50)

## 2015-11-16 ENCOUNTER — Encounter: Payer: Self-pay | Admitting: Internal Medicine

## 2015-11-16 ENCOUNTER — Ambulatory Visit (INDEPENDENT_AMBULATORY_CARE_PROVIDER_SITE_OTHER): Payer: Medicare Other | Admitting: Internal Medicine

## 2015-11-16 VITALS — BP 139/72 | HR 84 | Wt 210.0 lb

## 2015-11-16 DIAGNOSIS — Z0000 Encounter for general adult medical examination without abnormal findings: Secondary | ICD-10-CM

## 2015-11-16 DIAGNOSIS — Z23 Encounter for immunization: Secondary | ICD-10-CM

## 2015-11-16 NOTE — Patient Instructions (Signed)
Preventive Care for Adults, Female A healthy lifestyle and preventive care can promote health and wellness. Preventive health guidelines for women include the following key practices.  A routine yearly physical is a good way to check with your health care provider about your health and preventive screening. It is a chance to share any concerns and updates on your health and to receive a thorough exam.  Visit your dentist for a routine exam and preventive care every 6 months. Brush your teeth twice a day and floss once a day. Good oral hygiene prevents tooth decay and gum disease.  The frequency of eye exams is based on your age, health, family medical history, use of contact lenses, and other factors. Follow your health care provider's recommendations for frequency of eye exams.  Eat a healthy diet. Foods like vegetables, fruits, whole grains, low-fat dairy products, and lean protein foods contain the nutrients you need without too many calories. Decrease your intake of foods high in solid fats, added sugars, and salt. Eat the right amount of calories for you.Get information about a proper diet from your health care provider, if necessary.  Regular physical exercise is one of the most important things you can do for your health. Most adults should get at least 150 minutes of moderate-intensity exercise (any activity that increases your heart rate and causes you to sweat) each week. In addition, most adults need muscle-strengthening exercises on 2 or more days a week.  Maintain a healthy weight. The body mass index (BMI) is a screening tool to identify possible weight problems. It provides an estimate of body fat based on height and weight. Your health care provider can find your BMI and can help you achieve or maintain a healthy weight.For adults 20 years and older:  A BMI below 18.5 is considered underweight.  A BMI of 18.5 to 24.9 is normal.  A BMI of 25 to 29.9 is considered overweight.  A  BMI of 30 and above is considered obese.  Maintain normal blood lipids and cholesterol levels by exercising and minimizing your intake of saturated fat. Eat a balanced diet with plenty of fruit and vegetables. Blood tests for lipids and cholesterol should begin at age 45 and be repeated every 5 years. If your lipid or cholesterol levels are high, you are over 50, or you are at high risk for heart disease, you may need your cholesterol levels checked more frequently.Ongoing high lipid and cholesterol levels should be treated with medicines if diet and exercise are not working.  If you smoke, find out from your health care provider how to quit. If you do not use tobacco, do not start.  Lung cancer screening is recommended for adults aged 45-80 years who are at high risk for developing lung cancer because of a history of smoking. A yearly low-dose CT scan of the lungs is recommended for people who have at least a 30-pack-year history of smoking and are a current smoker or have quit within the past 15 years. A pack year of smoking is smoking an average of 1 pack of cigarettes a day for 1 year (for example: 1 pack a day for 30 years or 2 packs a day for 15 years). Yearly screening should continue until the smoker has stopped smoking for at least 15 years. Yearly screening should be stopped for people who develop a health problem that would prevent them from having lung cancer treatment.  If you are pregnant, do not drink alcohol. If you are  breastfeeding, be very cautious about drinking alcohol. If you are not pregnant and choose to drink alcohol, do not have more than 1 drink per day. One drink is considered to be 12 ounces (355 mL) of beer, 5 ounces (148 mL) of wine, or 1.5 ounces (44 mL) of liquor.  Avoid use of street drugs. Do not share needles with anyone. Ask for help if you need support or instructions about stopping the use of drugs.  High blood pressure causes heart disease and increases the risk  of stroke. Your blood pressure should be checked at least every 1 to 2 years. Ongoing high blood pressure should be treated with medicines if weight loss and exercise do not work.  If you are 55-79 years old, ask your health care provider if you should take aspirin to prevent strokes.  Diabetes screening is done by taking a blood sample to check your blood glucose level after you have not eaten for a certain period of time (fasting). If you are not overweight and you do not have risk factors for diabetes, you should be screened once every 3 years starting at age 45. If you are overweight or obese and you are 40-70 years of age, you should be screened for diabetes every year as part of your cardiovascular risk assessment.  Breast cancer screening is essential preventive care for women. You should practice "breast self-awareness." This means understanding the normal appearance and feel of your breasts and may include breast self-examination. Any changes detected, no matter how small, should be reported to a health care provider. Women in their 20s and 30s should have a clinical breast exam (CBE) by a health care provider as part of a regular health exam every 1 to 3 years. After age 40, women should have a CBE every year. Starting at age 40, women should consider having a mammogram (breast X-ray test) every year. Women who have a family history of breast cancer should talk to their health care provider about genetic screening. Women at a high risk of breast cancer should talk to their health care providers about having an MRI and a mammogram every year.  Breast cancer gene (BRCA)-related cancer risk assessment is recommended for women who have family members with BRCA-related cancers. BRCA-related cancers include breast, ovarian, tubal, and peritoneal cancers. Having family members with these cancers may be associated with an increased risk for harmful changes (mutations) in the breast cancer genes BRCA1 and  BRCA2. Results of the assessment will determine the need for genetic counseling and BRCA1 and BRCA2 testing.  Your health care provider may recommend that you be screened regularly for cancer of the pelvic organs (ovaries, uterus, and vagina). This screening involves a pelvic examination, including checking for microscopic changes to the surface of your cervix (Pap test). You may be encouraged to have this screening done every 3 years, beginning at age 21.  For women ages 30-65, health care providers may recommend pelvic exams and Pap testing every 3 years, or they may recommend the Pap and pelvic exam, combined with testing for human papilloma virus (HPV), every 5 years. Some types of HPV increase your risk of cervical cancer. Testing for HPV may also be done on women of any age with unclear Pap test results.  Other health care providers may not recommend any screening for nonpregnant women who are considered low risk for pelvic cancer and who do not have symptoms. Ask your health care provider if a screening pelvic exam is right for   you.  If you have had past treatment for cervical cancer or a condition that could lead to cancer, you need Pap tests and screening for cancer for at least 20 years after your treatment. If Pap tests have been discontinued, your risk factors (such as having a new sexual partner) need to be reassessed to determine if screening should resume. Some women have medical problems that increase the chance of getting cervical cancer. In these cases, your health care provider may recommend more frequent screening and Pap tests.  Colorectal cancer can be detected and often prevented. Most routine colorectal cancer screening begins at the age of 50 years and continues through age 75 years. However, your health care provider may recommend screening at an earlier age if you have risk factors for colon cancer. On a yearly basis, your health care provider may provide home test kits to check  for hidden blood in the stool. Use of a small camera at the end of a tube, to directly examine the colon (sigmoidoscopy or colonoscopy), can detect the earliest forms of colorectal cancer. Talk to your health care provider about this at age 50, when routine screening begins. Direct exam of the colon should be repeated every 5-10 years through age 75 years, unless early forms of precancerous polyps or small growths are found.  People who are at an increased risk for hepatitis B should be screened for this virus. You are considered at high risk for hepatitis B if:  You were born in a country where hepatitis B occurs often. Talk with your health care provider about which countries are considered high risk.  Your parents were born in a high-risk country and you have not received a shot to protect against hepatitis B (hepatitis B vaccine).  You have HIV or AIDS.  You use needles to inject street drugs.  You live with, or have sex with, someone who has hepatitis B.  You get hemodialysis treatment.  You take certain medicines for conditions like cancer, organ transplantation, and autoimmune conditions.  Hepatitis C blood testing is recommended for all people born from 1945 through 1965 and any individual with known risks for hepatitis C.  Practice safe sex. Use condoms and avoid high-risk sexual practices to reduce the spread of sexually transmitted infections (STIs). STIs include gonorrhea, chlamydia, syphilis, trichomonas, herpes, HPV, and human immunodeficiency virus (HIV). Herpes, HIV, and HPV are viral illnesses that have no cure. They can result in disability, cancer, and death.  You should be screened for sexually transmitted illnesses (STIs) including gonorrhea and chlamydia if:  You are sexually active and are younger than 24 years.  You are older than 24 years and your health care provider tells you that you are at risk for this type of infection.  Your sexual activity has changed  since you were last screened and you are at an increased risk for chlamydia or gonorrhea. Ask your health care provider if you are at risk.  If you are at risk of being infected with HIV, it is recommended that you take a prescription medicine daily to prevent HIV infection. This is called preexposure prophylaxis (PrEP). You are considered at risk if:  You are sexually active and do not regularly use condoms or know the HIV status of your partner(s).  You take drugs by injection.  You are sexually active with a partner who has HIV.  Talk with your health care provider about whether you are at high risk of being infected with HIV. If   you choose to begin PrEP, you should first be tested for HIV. You should then be tested every 3 months for as long as you are taking PrEP.  Osteoporosis is a disease in which the bones lose minerals and strength with aging. This can result in serious bone fractures or breaks. The risk of osteoporosis can be identified using a bone density scan. Women ages 67 years and over and women at risk for fractures or osteoporosis should discuss screening with their health care providers. Ask your health care provider whether you should take a calcium supplement or vitamin D to reduce the rate of osteoporosis.  Menopause can be associated with physical symptoms and risks. Hormone replacement therapy is available to decrease symptoms and risks. You should talk to your health care provider about whether hormone replacement therapy is right for you.  Use sunscreen. Apply sunscreen liberally and repeatedly throughout the day. You should seek shade when your shadow is shorter than you. Protect yourself by wearing long sleeves, pants, a wide-brimmed hat, and sunglasses year round, whenever you are outdoors.  Once a month, do a whole body skin exam, using a mirror to look at the skin on your back. Tell your health care provider of new moles, moles that have irregular borders, moles that  are larger than a pencil eraser, or moles that have changed in shape or color.  Stay current with required vaccines (immunizations).  Influenza vaccine. All adults should be immunized every year.  Tetanus, diphtheria, and acellular pertussis (Td, Tdap) vaccine. Pregnant women should receive 1 dose of Tdap vaccine during each pregnancy. The dose should be obtained regardless of the length of time since the last dose. Immunization is preferred during the 27th-36th week of gestation. An adult who has not previously received Tdap or who does not know her vaccine status should receive 1 dose of Tdap. This initial dose should be followed by tetanus and diphtheria toxoids (Td) booster doses every 10 years. Adults with an unknown or incomplete history of completing a 3-dose immunization series with Td-containing vaccines should begin or complete a primary immunization series including a Tdap dose. Adults should receive a Td booster every 10 years.  Varicella vaccine. An adult without evidence of immunity to varicella should receive 2 doses or a second dose if she has previously received 1 dose. Pregnant females who do not have evidence of immunity should receive the first dose after pregnancy. This first dose should be obtained before leaving the health care facility. The second dose should be obtained 4-8 weeks after the first dose.  Human papillomavirus (HPV) vaccine. Females aged 13-26 years who have not received the vaccine previously should obtain the 3-dose series. The vaccine is not recommended for use in pregnant females. However, pregnancy testing is not needed before receiving a dose. If a female is found to be pregnant after receiving a dose, no treatment is needed. In that case, the remaining doses should be delayed until after the pregnancy. Immunization is recommended for any person with an immunocompromised condition through the age of 61 years if she did not get any or all doses earlier. During the  3-dose series, the second dose should be obtained 4-8 weeks after the first dose. The third dose should be obtained 24 weeks after the first dose and 16 weeks after the second dose.  Zoster vaccine. One dose is recommended for adults aged 30 years or older unless certain conditions are present.  Measles, mumps, and rubella (MMR) vaccine. Adults born  before 1957 generally are considered immune to measles and mumps. Adults born in 1957 or later should have 1 or more doses of MMR vaccine unless there is a contraindication to the vaccine or there is laboratory evidence of immunity to each of the three diseases. A routine second dose of MMR vaccine should be obtained at least 28 days after the first dose for students attending postsecondary schools, health care workers, or international travelers. People who received inactivated measles vaccine or an unknown type of measles vaccine during 1963-1967 should receive 2 doses of MMR vaccine. People who received inactivated mumps vaccine or an unknown type of mumps vaccine before 1979 and are at high risk for mumps infection should consider immunization with 2 doses of MMR vaccine. For females of childbearing age, rubella immunity should be determined. If there is no evidence of immunity, females who are not pregnant should be vaccinated. If there is no evidence of immunity, females who are pregnant should delay immunization until after pregnancy. Unvaccinated health care workers born before 1957 who lack laboratory evidence of measles, mumps, or rubella immunity or laboratory confirmation of disease should consider measles and mumps immunization with 2 doses of MMR vaccine or rubella immunization with 1 dose of MMR vaccine.  Pneumococcal 13-valent conjugate (PCV13) vaccine. When indicated, a person who is uncertain of his immunization history and has no record of immunization should receive the PCV13 vaccine. All adults 65 years of age and older should receive this  vaccine. An adult aged 19 years or older who has certain medical conditions and has not been previously immunized should receive 1 dose of PCV13 vaccine. This PCV13 should be followed with a dose of pneumococcal polysaccharide (PPSV23) vaccine. Adults who are at high risk for pneumococcal disease should obtain the PPSV23 vaccine at least 8 weeks after the dose of PCV13 vaccine. Adults older than 68 years of age who have normal immune system function should obtain the PPSV23 vaccine dose at least 1 year after the dose of PCV13 vaccine.  Pneumococcal polysaccharide (PPSV23) vaccine. When PCV13 is also indicated, PCV13 should be obtained first. All adults aged 65 years and older should be immunized. An adult younger than age 65 years who has certain medical conditions should be immunized. Any person who resides in a nursing home or long-term care facility should be immunized. An adult smoker should be immunized. People with an immunocompromised condition and certain other conditions should receive both PCV13 and PPSV23 vaccines. People with human immunodeficiency virus (HIV) infection should be immunized as soon as possible after diagnosis. Immunization during chemotherapy or radiation therapy should be avoided. Routine use of PPSV23 vaccine is not recommended for American Indians, [place] Natives, or people younger than 65 years unless there are medical conditions that require PPSV23 vaccine. When indicated, people who have unknown immunization and have no record of immunization should receive PPSV23 vaccine. One-time revaccination 5 years after the first dose of PPSV23 is recommended for people aged 19-64 years who have chronic kidney failure, nephrotic syndrome, asplenia, or immunocompromised conditions. People who received 1-2 doses of PPSV23 before age 65 years should receive another dose of PPSV23 vaccine at age 65 years or later if at least 5 years have passed since the previous dose. Doses of PPSV23 are not  needed for people immunized with PPSV23 at or after age 65 years.  Meningococcal vaccine. Adults with asplenia or persistent complement component deficiencies should receive 2 doses of quadrivalent meningococcal conjugate (MenACWY-D) vaccine. The doses should be obtained   at least 2 months apart. Microbiologists working with certain meningococcal bacteria, [place] recruits, people at risk during an outbreak, and people who travel to or live in countries with a high rate of meningitis should be immunized. A first-year college student up through age 34 years who is living in a residence hall should receive a dose if she did not receive a dose on or after her 16th birthday. Adults who have certain high-risk conditions should receive one or more doses of vaccine.  Hepatitis A vaccine. Adults who wish to be protected from this disease, have certain high-risk conditions, work with hepatitis A-infected animals, work in hepatitis A research labs, or travel to or work in countries with a high rate of hepatitis A should be immunized. Adults who were previously unvaccinated and who anticipate close contact with an international adoptee during the first 60 days after arrival in the [place] from a country with a high rate of hepatitis A should be immunized.  Hepatitis B vaccine. Adults who wish to be protected from this disease, have certain high-risk conditions, may be exposed to blood or other infectious body fluids, are household contacts or sex partners of hepatitis B positive people, are clients or workers in certain care facilities, or travel to or work in countries with a high rate of hepatitis B should be immunized.  Haemophilus influenzae type b (Hib) vaccine. A previously unvaccinated person with asplenia or sickle cell disease or having a scheduled splenectomy should receive 1 dose of Hib vaccine. Regardless of previous immunization, a recipient of a hematopoietic stem cell transplant should receive a  3-dose series 6-12 months after her successful transplant. Hib vaccine is not recommended for adults with HIV infection. Preventive Services / Frequency Ages 35 to 4 years  Blood pressure check.** / Every 3-5 years.  Lipid and cholesterol check.** / Every 5 years beginning at age 60.  Clinical breast exam.** / Every 3 years for women in their 71s and 10s.  BRCA-related cancer risk assessment.** / For women who have family members with a BRCA-related cancer (breast, ovarian, tubal, or peritoneal cancers).  Pap test.** / Every 2 years from ages 76 through 26. Every 3 years starting at age 61 through age 76 or 93 with a history of 3 consecutive normal Pap tests.  HPV screening.** / Every 3 years from ages 37 through ages 60 to 51 with a history of 3 consecutive normal Pap tests.  Hepatitis C blood test.** / For any individual with known risks for hepatitis C.  Skin self-exam. / Monthly.  Influenza vaccine. / Every year.  Tetanus, diphtheria, and acellular pertussis (Tdap, Td) vaccine.** / Consult your health care provider. Pregnant women should receive 1 dose of Tdap vaccine during each pregnancy. 1 dose of Td every 10 years.  Varicella vaccine.** / Consult your health care provider. Pregnant females who do not have evidence of immunity should receive the first dose after pregnancy.  HPV vaccine. / 3 doses over 6 months, if 93 and younger. The vaccine is not recommended for use in pregnant females. However, pregnancy testing is not needed before receiving a dose.  Measles, mumps, rubella (MMR) vaccine.** / You need at least 1 dose of MMR if you were born in 1957 or later. You may also need a 2nd dose. For females of childbearing age, rubella immunity should be determined. If there is no evidence of immunity, females who are not pregnant should be vaccinated. If there is no evidence of immunity, females who are  pregnant should delay immunization until after pregnancy.  Pneumococcal  13-valent conjugate (PCV13) vaccine.** / Consult your health care provider.  Pneumococcal polysaccharide (PPSV23) vaccine.** / 1 to 2 doses if you smoke cigarettes or if you have certain conditions.  Meningococcal vaccine.** / 1 dose if you are age 68 to 8 years and a Market researcher living in a residence hall, or have one of several medical conditions, you need to get vaccinated against meningococcal disease. You may also need additional booster doses.  Hepatitis A vaccine.** / Consult your health care provider.  Hepatitis B vaccine.** / Consult your health care provider.  Haemophilus influenzae type b (Hib) vaccine.** / Consult your health care provider. Ages 7 to 53 years  Blood pressure check.** / Every year.  Lipid and cholesterol check.** / Every 5 years beginning at age 25 years.  Lung cancer screening. / Every year if you are aged 11-80 years and have a 30-pack-year history of smoking and currently smoke or have quit within the past 15 years. Yearly screening is stopped once you have quit smoking for at least 15 years or develop a health problem that would prevent you from having lung cancer treatment.  Clinical breast exam.** / Every year after age 48 years.  BRCA-related cancer risk assessment.** / For women who have family members with a BRCA-related cancer (breast, ovarian, tubal, or peritoneal cancers).  Mammogram.** / Every year beginning at age 41 years and continuing for as long as you are in good health. Consult with your health care provider.  Pap test.** / Every 3 years starting at age 65 years through age 37 or 70 years with a history of 3 consecutive normal Pap tests.  HPV screening.** / Every 3 years from ages 72 years through ages 60 to 40 years with a history of 3 consecutive normal Pap tests.  Fecal occult blood test (FOBT) of stool. / Every year beginning at age 21 years and continuing until age 5 years. You may not need to do this test if you get  a colonoscopy every 10 years.  Flexible sigmoidoscopy or colonoscopy.** / Every 5 years for a flexible sigmoidoscopy or every 10 years for a colonoscopy beginning at age 35 years and continuing until age 48 years.  Hepatitis C blood test.** / For all people born from [birth year] through 1965 and any individual with known risks for hepatitis C.  Skin self-exam. / Monthly.  Influenza vaccine. / Every year.  Tetanus, diphtheria, and acellular pertussis (Tdap/Td) vaccine.** / Consult your health care provider. Pregnant women should receive 1 dose of Tdap vaccine during each pregnancy. 1 dose of Td every 10 years.  Varicella vaccine.** / Consult your health care provider. Pregnant females who do not have evidence of immunity should receive the first dose after pregnancy.  Zoster vaccine.** / 1 dose for adults aged 30 years or older.  Measles, mumps, rubella (MMR) vaccine.** / You need at least 1 dose of MMR if you were born in 1957 or later. You may also need a second dose. For females of childbearing age, rubella immunity should be determined. If there is no evidence of immunity, females who are not pregnant should be vaccinated. If there is no evidence of immunity, females who are pregnant should delay immunization until after pregnancy.  Pneumococcal 13-valent conjugate (PCV13) vaccine.** / Consult your health care provider.  Pneumococcal polysaccharide (PPSV23) vaccine.** / 1 to 2 doses if you smoke cigarettes or if you have certain conditions.  Meningococcal vaccine.** /  Consult your health care provider.  Hepatitis A vaccine.** / Consult your health care provider.  Hepatitis B vaccine.** / Consult your health care provider.  Haemophilus influenzae type b (Hib) vaccine.** / Consult your health care provider. Ages 64 years and over  Blood pressure check.** / Every year.  Lipid and cholesterol check.** / Every 5 years beginning at age 23 years.  Lung cancer screening. / Every year if you  are aged 16-80 years and have a 30-pack-year history of smoking and currently smoke or have quit within the past 15 years. Yearly screening is stopped once you have quit smoking for at least 15 years or develop a health problem that would prevent you from having lung cancer treatment.  Clinical breast exam.** / Every year after age 74 years.  BRCA-related cancer risk assessment.** / For women who have family members with a BRCA-related cancer (breast, ovarian, tubal, or peritoneal cancers).  Mammogram.** / Every year beginning at age 44 years and continuing for as long as you are in good health. Consult with your health care provider.  Pap test.** / Every 3 years starting at age 58 years through age 22 or 39 years with 3 consecutive normal Pap tests. Testing can be stopped between 65 and 70 years with 3 consecutive normal Pap tests and no abnormal Pap or HPV tests in the past 10 years.  HPV screening.** / Every 3 years from ages 64 years through ages 70 or 61 years with a history of 3 consecutive normal Pap tests. Testing can be stopped between 65 and 70 years with 3 consecutive normal Pap tests and no abnormal Pap or HPV tests in the past 10 years.  Fecal occult blood test (FOBT) of stool. / Every year beginning at age 40 years and continuing until age 27 years. You may not need to do this test if you get a colonoscopy every 10 years.  Flexible sigmoidoscopy or colonoscopy.** / Every 5 years for a flexible sigmoidoscopy or every 10 years for a colonoscopy beginning at age 7 years and continuing until age 32 years.  Hepatitis C blood test.** / For all people born from [birth year] through 1964 and any individual with known risks for hepatitis C.  Osteoporosis screening.** / A one-time screening for women ages 30 years and over and women at risk for fractures or osteoporosis.  Skin self-exam. / Monthly.  Influenza vaccine. / Every year.  Tetanus, diphtheria, and acellular pertussis (Tdap/Td)  vaccine.** / 1 dose of Td every 10 years.  Varicella vaccine.** / Consult your health care provider.  Zoster vaccine.** / 1 dose for adults aged 35 years or older.  Pneumococcal 13-valent conjugate (PCV13) vaccine.** / Consult your health care provider.  Pneumococcal polysaccharide (PPSV23) vaccine.** / 1 dose for all adults aged 46 years and older.  Meningococcal vaccine.** / Consult your health care provider.  Hepatitis A vaccine.** / Consult your health care provider.  Hepatitis B vaccine.** / Consult your health care provider.  Haemophilus influenzae type b (Hib) vaccine.** / Consult your health care provider. ** Family history and personal history of risk and conditions may change your health care provider's recommendations.   This information is not intended to replace advice given to you by your health care provider. Make sure you discuss any questions you have with your health care provider.   Document Released: 01/17/2002 Document Revised: 12/12/2014 Document Reviewed: 04/18/2011 Elsevier Interactive Patient Education [employer].

## 2015-11-16 NOTE — Progress Notes (Signed)
Pre visit review using our clinic review tool, if applicable. No additional management support is needed unless otherwise documented below in the visit note. 

## 2015-11-16 NOTE — Assessment & Plan Note (Signed)

## 2015-11-16 NOTE — Progress Notes (Signed)
Subjective:  Patient ID: Diane Sawyer, female    DOB: 05-Mar-1946  Age: 69 y.o. MRN: [id number]  CC: No chief complaint on file.   HPI Diane Sawyer presents for a well exam. F/u HTN, OA. BP was elevated to 170 - pt has been taking 1/2 tab Norvasc  Outpatient Prescriptions Prior to Visit  Medication Sig Dispense Refill  . ALPRAZolam (XANAX) 0.25 MG tablet Take 1 tablet (0.25 mg total) by mouth 2 (two) times daily as needed for anxiety. 60 tablet 3  . amLODipine (NORVASC) 5 MG tablet TAKE 1 TABLET (5 MG TOTAL) BY MOUTH DAILY. 90 tablet 3  . Cholecalciferol 1000 UNITS capsule Take 1,000 Units by mouth daily.      . clobetasol cream (TEMOVATE) 0.05 % as needed.    . etodolac (LODINE) 500 MG tablet Take 1 tablet (500 mg total) by mouth 2 (two) times daily as needed. 60 tablet 3  . fexofenadine (ALLEGRA) 180 MG tablet Take 180 mg by mouth daily.      . IRON PO Take by mouth daily.      . pantoprazole (PROTONIX) 40 MG tablet Take 1 tablet (40 mg total) by mouth daily. 90 tablet 3  . ketoconazole (NIZORAL) 2 % cream Apply 1 application topically 2 (two) times daily. (Patient not taking: Reported on 11/16/2015) 60 g 0  . triamcinolone cream (KENALOG) 0.1 % Apply 1 application topically 2 (two) times daily. (Patient not taking: Reported on 11/16/2015) 45 g 0   No facility-administered medications prior to visit.    ROS Review of Systems  Constitutional: Negative for chills, activity change, appetite change, fatigue and unexpected weight change.  HENT: Negative for congestion, mouth sores and sinus pressure.   Eyes: Negative for visual disturbance.  Respiratory: Negative for cough and chest tightness.   Gastrointestinal: Negative for nausea and abdominal pain.  Genitourinary: Negative for frequency, difficulty urinating and vaginal pain.  Musculoskeletal: Negative for back pain and gait problem.  Skin: Negative for pallor and rash.  Neurological: Negative for dizziness, tremors, weakness,  numbness and headaches.  Psychiatric/Behavioral: Negative for confusion and sleep disturbance.    Objective:  BP 139/72 mmHg  Pulse 84  Wt 210 lb (95.255 kg)  SpO2 96%  BP Readings from Last 3 Encounters:  11/16/15 139/72  05/18/15 142/80  11/10/14 122/80    Wt Readings from Last 3 Encounters:  11/16/15 210 lb (95.255 kg)  05/18/15 210 lb (95.255 kg)  11/10/14 207 lb (93.895 kg)    Physical Exam  Constitutional: She appears well-developed. No distress.  HENT:  Head: Normocephalic.  Right Ear: External ear normal.  Left Ear: External ear normal.  Nose: Nose normal.  Mouth/Throat: Oropharynx is clear and moist.  Eyes: Conjunctivae are normal. Pupils are equal, round, and reactive to light. Right eye exhibits no discharge. Left eye exhibits no discharge.  Neck: Normal range of motion. Neck supple. No JVD present. No tracheal deviation present. No thyromegaly present.  Cardiovascular: Normal rate, regular rhythm and normal heart sounds.   Pulmonary/Chest: No stridor. No respiratory distress. She has no wheezes.  Abdominal: Soft. Bowel sounds are normal. She exhibits no distension and no mass. There is no tenderness. There is no rebound and no guarding.  Musculoskeletal: She exhibits no edema or tenderness.  Lymphadenopathy:    She has no cervical adenopathy.  Neurological: She displays normal reflexes. No cranial nerve deficit. She exhibits normal muscle tone. Coordination normal.  Skin: No rash noted. No erythema.  Psychiatric:  She has a normal mood and affect. Her behavior is normal. Judgment and thought content normal.    Lab Results  Component Value Date   WBC 5.7 11/09/2015   HGB 14.0 11/09/2015   HCT 42.0 11/09/2015   PLT 251.0 11/09/2015   GLUCOSE 92 11/09/2015   CHOL 170 11/09/2015   TRIG 97.0 11/09/2015   HDL 52.80 11/09/2015   LDLCALC 98 11/09/2015   ALT 9 11/09/2015   AST 13 11/09/2015   NA 143 11/09/2015   K 4.1 11/09/2015   CL 107 11/09/2015    CREATININE 0.68 11/09/2015   BUN 10 11/09/2015   CO2 28 11/09/2015   TSH 0.87 11/09/2015    Mm Screening Breast Tomo Bilateral  11/11/2014  CLINICAL DATA:  Screening. EXAM: DIGITAL SCREENING BILATERAL MAMMOGRAM WITH 3D TOMO WITH CAD COMPARISON:  Previous exam(s). ACR Breast Density Category b: There are scattered areas of fibroglandular density. FINDINGS: There are no findings suspicious for malignancy. Images were processed with CAD. IMPRESSION: No mammographic evidence of malignancy. A result letter of this screening mammogram will be mailed directly to the patient. RECOMMENDATION: Screening mammogram in one year. (Code:SM-B-01Y) BI-RADS CATEGORY  1: Negative. Electronically Signed   By: Shon Hale M.D.   On: 11/11/2014 11:34    Assessment & Plan:   Diagnoses and all orders for this visit:  Well adult exam  I am having Diane Sawyer maintain her IRON PO, Cholecalciferol, fexofenadine, etodolac, triamcinolone cream, ketoconazole, clobetasol cream, pantoprazole, amLODipine, and ALPRAZolam.  No orders of the defined types were placed in this encounter.     Follow-up: Return in about 6 months (around 05/16/2016) for a follow-up visit.  Walker Kehr, MD

## 2015-11-25 ENCOUNTER — Ambulatory Visit
Admission: RE | Admit: 2015-11-25 | Discharge: 2015-11-25 | Disposition: A | Discharge status: Home / Self Care | Payer: Medicare Other | Source: Ambulatory Visit

## 2015-11-25 DIAGNOSIS — Z1231 Encounter for screening mammogram for malignant neoplasm of breast: Secondary | ICD-10-CM | POA: Diagnosis not present

## 2016-04-11 DIAGNOSIS — L089 Local infection of the skin and subcutaneous tissue, unspecified: Secondary | ICD-10-CM | POA: Diagnosis not present

## 2016-04-11 DIAGNOSIS — L239 Allergic contact dermatitis, unspecified cause: Secondary | ICD-10-CM | POA: Diagnosis not present

## 2016-05-16 ENCOUNTER — Ambulatory Visit: Payer: Medicare Other | Admitting: Internal Medicine

## 2016-05-23 ENCOUNTER — Other Ambulatory Visit (INDEPENDENT_AMBULATORY_CARE_PROVIDER_SITE_OTHER): Payer: Medicare Other

## 2016-05-23 ENCOUNTER — Ambulatory Visit (INDEPENDENT_AMBULATORY_CARE_PROVIDER_SITE_OTHER): Payer: Medicare Other | Admitting: Internal Medicine

## 2016-05-23 ENCOUNTER — Encounter: Payer: Self-pay | Admitting: Internal Medicine

## 2016-05-23 VITALS — BP 130/78 | HR 73 | Wt 205.0 lb

## 2016-05-23 DIAGNOSIS — I10 Essential (primary) hypertension: Secondary | ICD-10-CM | POA: Diagnosis not present

## 2016-05-23 DIAGNOSIS — K219 Gastro-esophageal reflux disease without esophagitis: Secondary | ICD-10-CM

## 2016-05-23 DIAGNOSIS — R202 Paresthesia of skin: Secondary | ICD-10-CM

## 2016-05-23 DIAGNOSIS — D509 Iron deficiency anemia, unspecified: Secondary | ICD-10-CM

## 2016-05-23 DIAGNOSIS — K449 Diaphragmatic hernia without obstruction or gangrene: Secondary | ICD-10-CM | POA: Diagnosis not present

## 2016-05-23 LAB — CBC WITH DIFFERENTIAL/PLATELET
BASOS ABS: 0 10*3/uL (ref 0.0–0.1)
Basophils Relative: 0.6 % (ref 0.0–3.0)
EOS ABS: 0.2 10*3/uL (ref 0.0–0.7)
Eosinophils Relative: 4 % (ref 0.0–5.0)
HCT: 39.7 % (ref 36.0–46.0)
Hemoglobin: 13.5 g/dL (ref 12.0–15.0)
LYMPHS ABS: 1.6 10*3/uL (ref 0.7–4.0)
Lymphocytes Relative: 29 % (ref 12.0–46.0)
MCHC: 34 g/dL (ref 30.0–36.0)
MCV: 88.1 fl (ref 78.0–100.0)
MONO ABS: 0.4 10*3/uL (ref 0.1–1.0)
Monocytes Relative: 7 % (ref 3.0–12.0)
NEUTROS PCT: 59.4 % (ref 43.0–77.0)
Neutro Abs: 3.3 10*3/uL (ref 1.4–7.7)
PLATELETS: 220 10*3/uL (ref 150.0–400.0)
RBC: 4.5 Mil/uL (ref 3.87–5.11)
RDW: 13.8 % (ref 11.5–15.5)
WBC: 5.6 10*3/uL (ref 4.0–10.5)

## 2016-05-23 LAB — VITAMIN B12: Vitamin B-12: 761 pg/mL (ref 211–911)

## 2016-05-23 LAB — BASIC METABOLIC PANEL
BUN: 12 mg/dL (ref 6–23)
CO2: 26 mEq/L (ref 19–32)
Calcium: 9.1 mg/dL (ref 8.4–10.5)
Chloride: 108 mEq/L (ref 96–112)
Creatinine, Ser: 0.7 mg/dL (ref 0.40–1.20)
GFR: 88.21 mL/min (ref >60.00)
Glucose, Bld: 91 mg/dL (ref 70–99)
POTASSIUM: 4.6 meq/L (ref 3.5–5.1)
Sodium: 142 mEq/L (ref 135–145)

## 2016-05-23 LAB — IBC PANEL
Iron: 82 ug/dL (ref 42–145)
SATURATION RATIOS: 24.9 % (ref 20.0–50.0)
TRANSFERRIN: 235 mg/dL (ref 212.0–360.0)

## 2016-05-23 MED ORDER — AMLODIPINE BESYLATE 5 MG PO TABS: ORAL_TABLET | ORAL | Status: DC

## 2016-05-23 MED ORDER — PANTOPRAZOLE SODIUM 40 MG PO TBEC: 40.0000 mg | DELAYED_RELEASE_TABLET | Freq: Every day | ORAL | Status: DC

## 2016-05-23 NOTE — Assessment & Plan Note (Signed)
On OTC iron Labs

## 2016-05-23 NOTE — Assessment & Plan Note (Signed)
On Protonix 

## 2016-05-23 NOTE — Progress Notes (Signed)
Pre visit review using our clinic review tool, if applicable. No additional management support is needed unless otherwise documented below in the visit note. 

## 2016-05-23 NOTE — Assessment & Plan Note (Signed)
On amlodipine.

## 2016-05-23 NOTE — Progress Notes (Signed)
Subjective:  Patient ID: Diane Sawyer, female    DOB: 06/26/1946  Age: 70 y.o. MRN: [id number]  CC: No chief complaint on file.   HPI Diane Sawyer presents for HTN, anxiety, OA f/u. The pt is taking OTC Vit B12, iron, Vit D  Outpatient Prescriptions Prior to Visit  Medication Sig Dispense Refill  . ALPRAZolam (XANAX) 0.25 MG tablet Take 1 tablet (0.25 mg total) by mouth 2 (two) times daily as needed for anxiety. 60 tablet 3  . amLODipine (NORVASC) 5 MG tablet TAKE 1 TABLET (5 MG TOTAL) BY MOUTH DAILY. 90 tablet 3  . Cholecalciferol 1000 UNITS capsule Take 1,000 Units by mouth daily.      [name] etodolac (LODINE) 500 MG tablet Take 1 tablet (500 mg total) by mouth 2 (two) times daily as needed. 60 tablet 3  . fexofenadine (ALLEGRA) 180 MG tablet Take 180 mg by mouth daily.      . IRON PO Take by mouth daily.      . pantoprazole (PROTONIX) 40 MG tablet Take 1 tablet (40 mg total) by mouth daily. 90 tablet 3  . clobetasol cream (TEMOVATE) 0.05 % as needed. Reported on 05/23/2016    . ketoconazole (NIZORAL) 2 % cream Apply 1 application topically 2 (two) times daily. (Patient not taking: Reported on 05/23/2016) 60 g 0  . triamcinolone cream (KENALOG) 0.1 % Apply 1 application topically 2 (two) times daily. (Patient not taking: Reported on 05/23/2016) 45 g 0   No facility-administered medications prior to visit.    ROS Review of Systems  Constitutional: Negative for chills, activity change, appetite change, fatigue and unexpected weight change.  HENT: Negative for congestion, mouth sores and sinus pressure.   Eyes: Negative for visual disturbance.  Respiratory: Negative for cough and chest tightness.   Gastrointestinal: Negative for nausea and abdominal pain.  Genitourinary: Negative for frequency, difficulty urinating and vaginal pain.  Musculoskeletal: Positive for back pain and arthralgias. Negative for gait problem.  Skin: Negative for pallor and rash.  Neurological: Negative for  dizziness, tremors, weakness, numbness and headaches.  Psychiatric/Behavioral: Negative for confusion and sleep disturbance. The patient is nervous/anxious.     Objective:  BP 130/78 mmHg  Pulse 73  Wt 205 lb (92.987 kg)  SpO2 97%  BP Readings from Last 3 Encounters:  05/23/16 130/78  11/16/15 139/72  05/18/15 142/80    Wt Readings from Last 3 Encounters:  05/23/16 205 lb (92.987 kg)  11/16/15 210 lb (95.255 kg)  05/18/15 210 lb (95.255 kg)    Physical Exam  Constitutional: She appears well-developed. No distress.  HENT:  Head: Normocephalic.  Right Ear: External ear normal.  Left Ear: External ear normal.  Nose: Nose normal.  Mouth/Throat: Oropharynx is clear and moist.  Eyes: Conjunctivae are normal. Pupils are equal, round, and reactive to light. Right eye exhibits no discharge. Left eye exhibits no discharge.  Neck: Normal range of motion. Neck supple. No JVD present. No tracheal deviation present. No thyromegaly present.  Cardiovascular: Normal rate, regular rhythm and normal heart sounds.   Pulmonary/Chest: No stridor. No respiratory distress. She has no wheezes.  Abdominal: Soft. Bowel sounds are normal. She exhibits no distension and no mass. There is no tenderness. There is no rebound and no guarding.  Musculoskeletal: She exhibits tenderness. She exhibits no edema.  Lymphadenopathy:    She has no cervical adenopathy.  Neurological: She displays normal reflexes. No cranial nerve deficit. She exhibits normal muscle tone. Coordination normal.  Skin:  No rash noted. No erythema.  Psychiatric: She has a normal mood and affect. Her behavior is normal. Judgment and thought content normal.  LS tender w/ROM    Lab Results  Component Value Date   WBC 5.7 11/09/2015   HGB 14.0 11/09/2015   HCT 42.0 11/09/2015   PLT 251.0 11/09/2015   GLUCOSE 92 11/09/2015   CHOL 170 11/09/2015   TRIG 97.0 11/09/2015   HDL 52.80 11/09/2015   LDLCALC 98 11/09/2015   ALT 9  11/09/2015   AST 13 11/09/2015   NA 143 11/09/2015   K 4.1 11/09/2015   CL 107 11/09/2015   CREATININE 0.68 11/09/2015   BUN 10 11/09/2015   CO2 28 11/09/2015   TSH 0.87 11/09/2015    Mm Digital Screening Bilateral  11/26/2015  CLINICAL DATA:  Screening. EXAM: DIGITAL SCREENING BILATERAL MAMMOGRAM WITH CAD COMPARISON:  Previous exam(s). ACR Breast Density Category b: There are scattered areas of fibroglandular density. FINDINGS: There are no findings suspicious for malignancy. Images were processed with CAD. IMPRESSION: No mammographic evidence of malignancy. A result letter of this screening mammogram will be mailed directly to the patient. RECOMMENDATION: Screening mammogram in one year. (Code:SM-B-01Y) BI-RADS CATEGORY  1: Negative. Electronically Signed   By: Abelardo Diesel M.D.   On: 11/26/2015 10:06    Assessment & Plan:   There are no diagnoses linked to this encounter. I am having Ms. Haluska maintain her IRON PO, Cholecalciferol, fexofenadine, etodolac, triamcinolone cream, ketoconazole, clobetasol cream, pantoprazole, amLODipine, ALPRAZolam, and cyanocobalamin.  Meds ordered this encounter  Medications  . cyanocobalamin 1000 MCG tablet    Sig: Take 1,000 mcg by mouth daily.     Follow-up: No Follow-up on file.  Walker Kehr, MD

## 2016-07-04 ENCOUNTER — Other Ambulatory Visit: Payer: Self-pay | Admitting: Internal Medicine

## 2016-09-07 DIAGNOSIS — S32000A Wedge compression fracture of unspecified lumbar vertebra, initial encounter for closed fracture: Secondary | ICD-10-CM

## 2016-09-07 HISTORY — DX: Wedge compression fracture of unspecified lumbar vertebra, initial encounter for closed fracture: S32.000A

## 2016-09-09 DIAGNOSIS — M545 Low back pain: Secondary | ICD-10-CM | POA: Diagnosis not present

## 2016-09-09 DIAGNOSIS — M62830 Muscle spasm of back: Secondary | ICD-10-CM | POA: Diagnosis not present

## 2016-09-09 DIAGNOSIS — W19XXXA Unspecified fall, initial encounter: Secondary | ICD-10-CM | POA: Diagnosis not present

## 2016-09-11 DIAGNOSIS — S335XXA Sprain of ligaments of lumbar spine, initial encounter: Secondary | ICD-10-CM | POA: Diagnosis not present

## 2016-09-12 DIAGNOSIS — M545 Low back pain: Secondary | ICD-10-CM | POA: Diagnosis not present

## 2016-09-20 DIAGNOSIS — M546 Pain in thoracic spine: Secondary | ICD-10-CM | POA: Diagnosis not present

## 2016-09-20 DIAGNOSIS — M545 Low back pain: Secondary | ICD-10-CM | POA: Diagnosis not present

## 2016-09-26 ENCOUNTER — Ambulatory Visit (INDEPENDENT_AMBULATORY_CARE_PROVIDER_SITE_OTHER): Payer: Medicare Other | Admitting: Adult Health

## 2016-09-26 DIAGNOSIS — M79605 Pain in left leg: Secondary | ICD-10-CM

## 2016-09-26 NOTE — Progress Notes (Signed)
See paper charting that was done during down time   Dorothyann Peng, NP

## 2016-10-01 ENCOUNTER — Emergency Department (HOSPITAL_COMMUNITY): Payer: Medicare Other

## 2016-10-01 ENCOUNTER — Emergency Department (HOSPITAL_COMMUNITY)
Admission: EM | Admit: 2016-10-01 | Discharge: 2016-10-01 | Disposition: A | Discharge status: Home / Self Care | Payer: Medicare Other | Attending: Emergency Medicine | Admitting: Emergency Medicine

## 2016-10-01 ENCOUNTER — Encounter (HOSPITAL_COMMUNITY): Payer: Self-pay | Admitting: Emergency Medicine

## 2016-10-01 DIAGNOSIS — I10 Essential (primary) hypertension: Secondary | ICD-10-CM | POA: Insufficient documentation

## 2016-10-01 DIAGNOSIS — R1013 Epigastric pain: Secondary | ICD-10-CM | POA: Diagnosis not present

## 2016-10-01 DIAGNOSIS — R109 Unspecified abdominal pain: Secondary | ICD-10-CM | POA: Diagnosis not present

## 2016-10-01 LAB — COMPREHENSIVE METABOLIC PANEL
ALBUMIN: 3.6 g/dL (ref 3.5–5.0)
ALK PHOS: 97 U/L (ref 38–126)
ALT: 45 U/L (ref 14–54)
AST: 81 U/L — ABNORMAL HIGH (ref 15–41)
Anion gap: 8 (ref 5–15)
BUN: 15 mg/dL (ref 6–20)
CO2: 24 mmol/L (ref 22–32)
CREATININE: 0.78 mg/dL (ref 0.44–1.00)
Calcium: 8.9 mg/dL (ref 8.9–10.3)
Chloride: 109 mmol/L (ref 101–111)
GFR calc Af Amer: >60 mL/min (ref >60)
GFR calc non Af Amer: >60 mL/min (ref >60)
GLUCOSE: 113 mg/dL — AB (ref 65–99)
Potassium: 3.7 mmol/L (ref 3.5–5.1)
Sodium: 141 mmol/L (ref 135–145)
Total Bilirubin: 0.7 mg/dL (ref 0.3–1.2)
Total Protein: 6.2 g/dL — ABNORMAL LOW (ref 6.5–8.1)

## 2016-10-01 LAB — I-STAT TROPONIN, ED
TROPONIN I, POC: 0 ng/mL (ref 0.00–0.08)
TROPONIN I, POC: 0 ng/mL (ref 0.00–0.08)

## 2016-10-01 LAB — CBC
HEMATOCRIT: 40.6 % (ref 36.0–46.0)
Hemoglobin: 13 g/dL (ref 12.0–15.0)
MCH: 29.6 pg (ref 26.0–34.0)
MCHC: 32 g/dL (ref 30.0–36.0)
MCV: 92.5 fL (ref 78.0–100.0)
PLATELETS: 220 10*3/uL (ref 150–400)
RBC: 4.39 MIL/uL (ref 3.87–5.11)
RDW: 13.5 % (ref 11.5–15.5)
WBC: 11.3 10*3/uL — ABNORMAL HIGH (ref 4.0–10.5)

## 2016-10-01 LAB — LIPASE, BLOOD: Lipase: 36 U/L (ref 11–51)

## 2016-10-01 MED ORDER — SUCRALFATE 1 GM/10ML PO SUSP: 1.0000 g | Freq: Three times a day (TID) | ORAL | 0 refills | Status: DC

## 2016-10-01 NOTE — ED Notes (Signed)
Pt ambulated to and from restroom; tolerated well 

## 2016-10-01 NOTE — ED Triage Notes (Signed)
Pt brought to ED by [facility] from home for c/o sharp 10/10 epigastric pain with some nausea, pt denies vomiting last BM yesterday and pt states it was normal, pt took some Pepto bismol with no relief. BP 137/61, HR 90-, R-20, SPO2 96%.  324 mg ASA given by EMS. Pt AO x4 on ED arrival, husband at the bedside, NAD noticed.

## 2016-10-01 NOTE — ED Provider Notes (Signed)
Blood pressure 145/80, pulse 82, temperature 98.8 F (37.1 C), temperature source Oral, resp. rate 11, height 5' 3.5" (1.613 m), weight 195 lb (88.5 kg), SpO2 97 %.  Assuming care from Dr. Betsey Holiday.  In short, Diane Sawyer is a 70 y.o. female with a chief complaint of Abdominal Pain (epigastric pain) .  Refer to the original H&P for additional details.  The current plan of care is to follow RUQ [place] and plain film of abdomen.  07:50 AM Right upper quadrant ultrasound and plain film resulted with no acute findings. I discussed this with the patient who continues to feel well. Given her age and concern for possible atypical ACS presentation I plan to repeat troponin at 9 AM.   10:46 AM I called the lab and they gave me a troponin repeat of 0.00. Apparently the system was down which accounted for the extended delay. Plan for discharge at this time.   Nanda Quinton, MD    Margette Fast, MD 10/01/16 [phone]

## 2016-10-01 NOTE — ED Provider Notes (Signed)
[facility] DEPT Provider Note   CSN: [id number] Arrival date & time: 10/01/16  [id number]     History   Chief Complaint Chief Complaint  Patient presents with  . Abdominal Pain    epigastric pain    HPI Diane Sawyer is a 70 y.o. female.  Patient presents with complaints of epigastric abdominal pain. Patient reports that she awakened from sleep to go to the bathroom and noticed she had some pain in her upper abdomen. She thought was her reflux and drank some water. She reports that after she drank the water she started having a sharp, stabbing, piercing pain in the upper abdomen. She took some Pepto-Bismol but did not get immediate relief and therefore called EMS. Patient reports that the pain has now eased off significantly. There was no associated shortness of breath, diaphoresis. Patient denies nausea and vomiting.      Past Medical History:  Diagnosis Date  . Allergic rhinitis   . Anemia   . GERD (gastroesophageal reflux disease)   . Hypertension   . Menopause     Patient Active Problem List   Diagnosis Date Noted  . Rash and nonspecific skin eruption 11/10/2014  . Left shoulder pain 11/18/2013  . Nonspecific abnormal electrocardiogram (ECG) (EKG) 11/18/2013  . Dizziness 11/18/2013  . Well adult exam 11/12/2012  . Anxiety 11/12/2012  . Hypertension 11/07/2011  . Insomnia 11/07/2011  . PHARYNGITIS 08/31/2009  . VITAMIN D DEFICIENCY 09/01/2008  . ANEMIA-IRON DEFICIENCY 09/01/2008  . ALLERGIC RHINITIS 09/01/2008  . GERD 09/01/2008  . ADENOMATOUS COLONIC POLYP 12/05/2007  . HEMORRHOIDS, INTERNAL 12/05/2007  . Hiatal hernia 12/05/2007  . DIVERTICULOSIS, COLON 12/05/2007  . OSTEOPENIA 12/05/2007    Past Surgical History:  Procedure Laterality Date  . NO PAST SURGERIES      OB History    No data available       Home Medications    Prior to Admission medications   Medication Sig Start Date End Date Taking? Authorizing Provider  acetaminophen (TYLENOL)  500 MG tablet Take 1,000 mg by mouth every 6 (six) hours as needed for mild pain.   Yes Historical Provider, MD  ALPRAZolam (XANAX) 0.25 MG tablet Take 1 tablet (0.25 mg total) by mouth 2 (two) times daily as needed for anxiety. 05/18/15  Yes Evie Lacks Plotnikov, MD  amLODipine (NORVASC) 5 MG tablet TAKE 1 TABLET (5 MG TOTAL) BY MOUTH DAILY. 05/23/16  Yes Cassandria Anger, MD  Cholecalciferol 1000 UNITS capsule Take 1,000 Units by mouth daily.     Yes Historical Provider, MD  clobetasol cream (TEMOVATE) [id number] % Apply 1 application topically daily as needed (dermatosis). Reported on 05/23/2016 02/16/15  Yes Historical Provider, MD  cyanocobalamin 1000 MCG tablet Take 1,000 mcg by mouth daily.   Yes Historical Provider, MD  etodolac (LODINE) 500 MG tablet Take 1 tablet (500 mg total) by mouth 2 (two) times daily as needed. 11/18/13  Yes Evie Lacks Plotnikov, MD  fexofenadine (ALLEGRA) 180 MG tablet Take 180 mg by mouth daily.     Yes Historical Provider, MD  IRON PO Take 1 tablet by mouth daily.    Yes Historical Provider, MD  meloxicam (MOBIC) 15 MG tablet Take 15 mg by mouth daily as needed for pain.   Yes Historical Provider, MD  methocarbamol (ROBAXIN) 500 MG tablet Take 500 mg by mouth every 8 (eight) hours as needed for muscle spasms.   Yes Historical Provider, MD  pantoprazole (PROTONIX) 40 MG tablet TAKE 1 TABLET BY  MOUTH EVERY DAY 07/04/16  Yes Evie Lacks Plotnikov, MD  traMADol (ULTRAM) 50 MG tablet Take 50 mg by mouth every 6 (six) hours as needed for moderate pain.   Yes Historical Provider, MD    Family History Family History  Problem Relation Age of Onset  . Stroke Mother 10  . Lung cancer Father 48  . Colon cancer Neg Hx     Social History Social History  Substance Use Topics  . Smoking status: Never Smoker  . Smokeless tobacco: Not on file  . Alcohol use No     Allergies   Benazepril hcl and Levofloxacin   Review of Systems Review of Systems  Gastrointestinal: Positive  for abdominal pain.  All other systems reviewed and are negative.    Physical Exam Updated Vital Signs BP 145/80   Pulse 82   Temp 98.8 F (37.1 C) (Oral)   Resp 11   Ht 5' 3.5" (1.613 m)   Wt 195 lb (88.5 kg)   SpO2 97%   BMI 34.00 kg/m   Physical Exam  Constitutional: She is oriented to person, place, and time. She appears well-developed and well-nourished. No distress.  HENT:  Head: Normocephalic and atraumatic.  Right Ear: Hearing normal.  Left Ear: Hearing normal.  Nose: Nose normal.  Mouth/Throat: Oropharynx is clear and moist and mucous membranes are normal.  Eyes: Conjunctivae and EOM are normal. Pupils are equal, round, and reactive to light.  Neck: Normal range of motion. Neck supple.  Cardiovascular: Regular rhythm, S1 normal and S2 normal.  Exam reveals no gallop and no friction rub.   No murmur heard. Pulmonary/Chest: Effort normal and breath sounds normal. No respiratory distress. She exhibits no tenderness.  Abdominal: Soft. Normal appearance and bowel sounds are normal. There is no hepatosplenomegaly. There is tenderness in the epigastric area. There is no rebound, no guarding, no tenderness at McBurney's point and negative Murphy's sign. No hernia.  Musculoskeletal: Normal range of motion.  Neurological: She is alert and oriented to person, place, and time. She has normal strength. No cranial nerve deficit or sensory deficit. Coordination normal. GCS eye subscore is 4. GCS verbal subscore is 5. GCS motor subscore is 6.  Skin: Skin is warm, dry and intact. No rash noted. No cyanosis.  Psychiatric: She has a normal mood and affect. Her speech is normal and behavior is normal. Thought content normal.  Nursing note and vitals reviewed.    ED Treatments / Results  Labs (all labs ordered are listed, but only abnormal results are displayed) Labs Reviewed  CBC - Abnormal; Notable for the following:       Result Value   WBC 11.3 (*)    All other components  within normal limits  LIPASE, BLOOD  COMPREHENSIVE METABOLIC PANEL  I-STAT TROPOININ, ED    EKG  EKG Interpretation  Date/Time:  Saturday October 01 2016 05:19:57 EDT Ventricular Rate:  85 PR Interval:    QRS Duration: 96 QT Interval:  370 QTC Calculation: 440 R Axis:   39 Text Interpretation:  Sinus rhythm Low voltage, extremity and precordial leads No previous tracing Confirmed by Betsey Holiday  MD, Lankford Gutzmer [phone]) on 10/01/2016 5:26:37 AM       Radiology Dg Abd Acute W/chest  Result Date: 10/01/2016 CLINICAL DATA:  70 year old female with abdominal pain. EXAM: DG ABDOMEN ACUTE W/ 1V CHEST COMPARISON:  CT dated 10/31/2007 FINDINGS: The lungs are clear. There is no pleural effusion or pneumothorax. The cardiac silhouette is within normal limits. Retrocardiac  density corresponds to the hiatal hernia seen on the prior CT. There is osteopenia. No acute osseous pathology. There is no bowel dilatation or evidence of obstruction. No free air or radiopaque calculi. The osseous structures and soft tissues appear unremarkable. IMPRESSION: Negative abdominal radiographs.  No acute cardiopulmonary disease. Electronically Signed   By: Anner Crete M.D.   On: 10/01/2016 06:31    Procedures Procedures (including critical care time)  Medications Ordered in ED Medications - No data to display   Initial Impression / Assessment and Plan / ED Course  I have reviewed the triage vital signs and the nursing notes.  Pertinent labs & imaging results that were available during my care of the patient were reviewed by me and considered in my medical decision making (see chart for details).  Clinical Course   Patient presented with epigastric discomfort. Patient reported mild pain upon awakening that she thought was simply her reflux, that her pain significantly worsens. Pain has resolved at arrival to the ER after taking Pepto-Bismol. Symptoms likely secondary to her reflux, but will further  evaluate for possible gallbladder disease. Ultrasound pending. Cardiac evaluation negative thus far. Symptoms atypical for cardiac. Will sign out to oncoming ER physician. Follow-up Metabolic panel, ultrasound, possible second troponin. Anticipate discharge if all negative.  Final Clinical Impressions(s) / ED Diagnoses   Final diagnoses:  Epigastric pain    New Prescriptions New Prescriptions   No medications on file     Orpah Greek, MD 10/01/16 [phone]

## 2016-10-01 NOTE — Discharge Instructions (Signed)

## 2016-10-01 NOTE — ED Notes (Signed)
Patient transported to X-ray 

## 2016-10-03 ENCOUNTER — Ambulatory Visit (INDEPENDENT_AMBULATORY_CARE_PROVIDER_SITE_OTHER): Payer: Medicare Other | Admitting: Nurse Practitioner

## 2016-10-03 ENCOUNTER — Encounter: Payer: Self-pay | Admitting: Nurse Practitioner

## 2016-10-03 VITALS — BP 138/66 | HR 88 | Temp 97.6°F | Ht 63.0 in | Wt 193.0 lb

## 2016-10-03 DIAGNOSIS — W19XXXA Unspecified fall, initial encounter: Secondary | ICD-10-CM | POA: Diagnosis not present

## 2016-10-03 DIAGNOSIS — M545 Low back pain, unspecified: Secondary | ICD-10-CM

## 2016-10-03 MED ORDER — TRAMADOL HCL 50 MG PO TABS: 50.0000 mg | ORAL_TABLET | Freq: Three times a day (TID) | ORAL | 0 refills | Status: DC | PRN

## 2016-10-03 NOTE — Progress Notes (Signed)
Subjective:  Patient ID: Diane Sawyer, female    DOB: 15-Jun-1946  Age: 70 y.o. MRN: [id number]  CC: Back Pain (follow up Pt stated [place] the beach)   HPI Epigastric pain: [name] presents for follow-up after ER visit on 10/01/2016. Abdominal pain has since resolved. She is no longer taking meloxicam and ibuprofen nor Lodine since 09/30/2016. Cardiac enzymes, EKG, chest x-ray, and abdominal ultrasound were all normal.  Lower back pain: Initial complaints of persistent lower back pain since 09/07/2016. She fell while at the beach. She was evaluated at local urgent care where she was prescribed ibuprofen and Robaxin. Because pain was persistent one week later she was again evaluated by [facility] orthopedics were prescribed meloxicam, tramadol and Robaxin. A back brace was also prescribed. She was informed that she has of compression fracture. She was last seen by [facility] orthopedics 09/27/2016. During last visit, she was informed about the possible need for MRI and/or physical therapy. She is unsure as to what is the next step to manage back pain. She reports lower back pain aggravated by prolonged standing. Denies any radiation to lower extremities, no weakness, no numbness or tingling, no change in GI or GU function, no rash.  Outpatient Medications Prior to Visit  Medication Sig Dispense Refill  . acetaminophen (TYLENOL) 500 MG tablet Take 1,000 mg by mouth every 6 (six) hours as needed for mild pain.    [name] ALPRAZolam (XANAX) 0.25 MG tablet Take 1 tablet (0.25 mg total) by mouth 2 (two) times daily as needed for anxiety. 60 tablet 3  . amLODipine (NORVASC) 5 MG tablet TAKE 1 TABLET (5 MG TOTAL) BY MOUTH DAILY. 90 tablet 3  . Cholecalciferol 1000 UNITS capsule Take 1,000 Units by mouth daily.      . clobetasol cream (TEMOVATE) [id number] % Apply 1 application topically daily as needed (dermatosis). Reported on 05/23/2016    . cyanocobalamin 1000 MCG tablet Take 1,000 mcg by mouth  daily.    . fexofenadine (ALLEGRA) 180 MG tablet Take 180 mg by mouth daily.      . IRON PO Take 1 tablet by mouth daily.     . methocarbamol (ROBAXIN) 500 MG tablet Take 500 mg by mouth every 8 (eight) hours as needed for muscle spasms.    . pantoprazole (PROTONIX) 40 MG tablet TAKE 1 TABLET BY MOUTH EVERY DAY 90 tablet 1  . sucralfate (CARAFATE) 1 GM/10ML suspension Take 10 mLs (1 g total) by mouth 4 (four) times daily -  with meals and at bedtime. 420 mL 0  . etodolac (LODINE) 500 MG tablet Take 1 tablet (500 mg total) by mouth 2 (two) times daily as needed. 60 tablet 3  . traMADol (ULTRAM) 50 MG tablet Take 50 mg by mouth every 6 (six) hours as needed for moderate pain.    . meloxicam (MOBIC) 15 MG tablet Take 15 mg by mouth daily as needed for pain.     No facility-administered medications prior to visit.     ROS See HPI  Objective:  BP 138/66 (BP Location: Left Arm, Patient Position: Sitting, Cuff Size: Large)   Pulse 88   Temp 97.6 F (36.4 C)   Ht 5\' 3"  (1.6 m)   Wt 193 lb (87.5 kg)   LMP  (Exact Date)   SpO2 97%   BMI 34.19 kg/m   BP Readings from Last 3 Encounters:  10/03/16 138/66  10/01/16 123/72  05/23/16 130/78    Wt Readings  from Last 3 Encounters:  10/03/16 193 lb (87.5 kg)  10/01/16 195 lb (88.5 kg)  05/23/16 205 lb (93 kg)    Physical Exam  Constitutional: She is oriented to person, place, and time.  Cardiovascular: Normal rate.   Pulmonary/Chest: Effort normal.  Abdominal: Soft. Bowel sounds are normal. She exhibits no distension. There is no tenderness.  Musculoskeletal: Normal range of motion. She exhibits tenderness. She exhibits no edema.       Right hip: Normal.       Left hip: Normal.       Lumbar back: She exhibits tenderness. She exhibits no bony tenderness, no swelling, no edema, no spasm and normal pulse.  Negative straight leg raise. No CVA tenderness.  Neurological: She is alert and oriented to person, place, and time.  Normal gait    Skin: Skin is warm and dry. No rash noted. No erythema.    Lab Results  Component Value Date   WBC 11.3 (H) 10/01/2016   HGB 13.0 10/01/2016   HCT 40.6 10/01/2016   PLT 220 10/01/2016   GLUCOSE 113 (H) 10/01/2016   CHOL 170 11/09/2015   TRIG 97.0 11/09/2015   HDL 52.80 11/09/2015   LDLCALC 98 11/09/2015   ALT 45 10/01/2016   AST 81 (H) 10/01/2016   NA 141 10/01/2016   K 3.7 10/01/2016   CL 109 10/01/2016   CREATININE 0.78 10/01/2016   BUN 15 10/01/2016   CO2 24 10/01/2016   TSH 0.87 11/09/2015    Dg Abd Acute W/chest  Result Date: 10/01/2016 CLINICAL DATA:  70 year old female with abdominal pain. EXAM: DG ABDOMEN ACUTE W/ 1V CHEST COMPARISON:  CT dated 10/31/2007 FINDINGS: The lungs are clear. There is no pleural effusion or pneumothorax. The cardiac silhouette is within normal limits. Retrocardiac density corresponds to the hiatal hernia seen on the prior CT. There is osteopenia. No acute osseous pathology. There is no bowel dilatation or evidence of obstruction. No free air or radiopaque calculi. The osseous structures and soft tissues appear unremarkable. IMPRESSION: Negative abdominal radiographs.  No acute cardiopulmonary disease. Electronically Signed   By: Anner Crete M.D.   On: 10/01/2016 06:31   US Abdomen Limited Ruq  Result Date: 10/01/2016 CLINICAL DATA:  Abdominal pain this morning. EXAM: US ABDOMEN LIMITED - RIGHT UPPER QUADRANT COMPARISON:  10/31/2007 CT abdomen/pelvis. FINDINGS: Gallbladder: Mildly distended gallbladder. No gallbladder wall thickening. Mild-to-moderate layering sludge. No gallstones. No pericholecystic fluid or sonographic Murphy's sign. Common bile duct: Diameter: 5 mm Liver: Simple lobulated 2.3 x 2.1 x 2.2 cm posterior left liver lobe cyst. No additional liver lesions. Background liver parenchymal echogenicity and echotexture are within normal limits. IMPRESSION: 1. Gallbladder sludge. No cholelithiasis. No evidence of acute cholecystitis.  2. No biliary ductal dilatation. 3. Benign left liver lobe cyst.  Otherwise normal liver. Electronically Signed   By: Ilona Sorrel M.D.   On: 10/01/2016 07:44    Assessment & Plan:   Catrice was seen today for back pain.  Diagnoses and all orders for this visit:  Acute midline low back pain without sciatica -     traMADol (ULTRAM) 50 MG tablet; Take 1 tablet (50 mg total) by mouth 3 (three) times daily with meals as needed for moderate pain.  Fall, initial encounter   I have discontinued Ms. Schutter's etodolac, meloxicam, and ibuprofen. I have also changed her traMADol. Additionally, I am having her maintain her IRON PO, Cholecalciferol, fexofenadine, clobetasol cream, ALPRAZolam, amLODipine, cyanocobalamin, pantoprazole, methocarbamol, acetaminophen, and sucralfate.  Meds ordered this encounter  Medications  . DISCONTD: ibuprofen (ADVIL,MOTRIN) 600 MG tablet  . traMADol (ULTRAM) 50 MG tablet    Sig: Take 1 tablet (50 mg total) by mouth 3 (three) times daily with meals as needed for moderate pain.    Dispense:  30 tablet    Refill:  0    Order Specific Question:   Supervising Provider    Answer:   Cassandria Anger [[id number]]    Follow-up: Return if symptoms worsen or fail to improve.  Wilfred Lacy, NP

## 2016-10-03 NOTE — Progress Notes (Signed)
Pre visit review using our clinic review tool, if applicable. No additional management support is needed unless otherwise documented below in the visit note. 

## 2016-10-03 NOTE — Patient Instructions (Addendum)
Continue follow up with [facility] within 1-2weeks to discuss further back pain management.  Avoid any NSAIDs at this time. Patient advised to get further tramadol refills from [facility]. Wear back brace during the day and off at night.  Vertebral Fracture A vertebral fracture is a break in one of the bones that make up the spine (vertebrae). The vertebrae are stacked on top of each other to form the spinal column. They support the body and protect the spinal cord. The vertebral column has an upper part (cervical spine), a middle part (thoracic spine), and a lower part (lumbar spine). Most vertebral fractures occur in the thoracic spine or lumbar spine. There are three main types of vertebral fractures:  Flexion fracture. This happens when vertebrae collapse. Vertebrae can collapse:  In the front (compression fracture). This type of fracture is common in people who have a condition that causes their bones to be weak and brittle (osteoporosis). The fracture can make a person lose height.  In the front and back (axial burst fracture).  Extension fracture. This happens when an external force pulls apart the vertebrae.  Rotation fracture. This happens when the spine bends extremely in one direction. This type can cause a piece of a vertebra to break off (transverse process fracture) or move out of its normal position (fracture dislocation). This type of fracture has a high risk for spinal cord injury. Vertebral fractures can range from mild to very severe. The most severe types are those that cause the broken bones to move out of place (unstable) and those that injure or press on the spinal cord. CAUSES This condition is usually caused by a forceful injury. This type of injury commonly results from:  Car accidents.  Falling or jumping from a great height.  Collisions in contact sports.  Violent acts, such as an assault or a gunshot wound. RISK FACTORS This injury is more  likely to happen to people who:  Have osteoporosis.  Participate in contact sports.  Are in situations that could result in falls or other violent injuries. SYMPTOMS Symptoms of this injury depend on the location and the type of fracture. The most common symptom is back pain that gets worse with movement. You may also have trouble standing or walking. If a fracture has damaged your spinal cord or is pressing on it, you may also have:  Numbness.  Tingling.  Weakness.  Loss of movement.  Loss of bowel or bladder control. DIAGNOSIS This injury may be diagnosed based on symptoms, medical history, and a physical exam. You may also have imaging tests to confirm the diagnosis. These may include:  Spine X-ray.  CT scan.  MRI. TREATMENT Treatment for this injury depends on the type of fracture. If your fracture is stable and does not affect your spinal cord, it may heal with nonsurgical treatment, such as:  Taking pain medicine.  Wearing a cast or a brace.  Doing physical therapy exercises. If your vertebral fracture is unstable or it affects your spinal cord, you may need surgical treatment, such as:  Laminectomy. This procedure involves removing the part of a vertebra that is pushing on the spinal cord (spinal decompression surgery). Bone fragments may also be removed.  Spinal fusion. This procedure is used to stabilize an unstable fracture. Vertebrae may be joined together with a piece of bone from another part of your body (graft) and held in place with rods, plates, or screws.  Vertebroplasty. In this procedure, bone cement is used to  rebuild collapsed vertebrae. HOME CARE INSTRUCTIONS General Instructions  Take medicines only as directed by your health care provider.  Do not drive or operate heavy machinery while taking pain medicine.  If directed, apply ice to the injured area:  Put ice in a plastic bag.  Place a towel between your skin and the bag.  Leave the ice  on for 30 minutes every two hours at first. Then apply the ice as needed.  Wear your neck brace or back brace as directed by your health care provider.  Do not drink alcohol. Alcohol can interfere with your treatment.  Keep all follow-up visits as directed by your health care provider. This is important. It can help to prevent permanent injury, disability, and long-lasting (chronic) pain. Activity  Stay in bed (on bed rest) only as directed by your health care provider. Being on bed rest for too long can make your condition worse.  Return to your normal activities as directed by your health care provider. Ask what activities are safe for you.  Do exercises to improve motion and strength in your back (physical therapy), as recommended by your health care provider.   Exercise regularly as directed by your health care provider. SEEK MEDICAL CARE IF:  You have a fever.  You develop a cough that makes your pain worse.  Your pain medicine is not helping.  Your pain does not get better over time.  You cannot return to your normal activities as planned or expected. SEEK IMMEDIATE MEDICAL CARE IF:  Your pain is very bad and it suddenly gets worse.  You are unable to move any body part (paralysis) that is below the level of your injury.  You have numbness, tingling, or weakness in any body part that is below the level of your injury.  You cannot control your bladder or bowels.   This information is not intended to replace advice given to you by your health care provider. Make sure you discuss any questions you have with your health care provider.   Document Released: 12/29/2004 Document Revised: 04/07/2015 Document Reviewed: 11/26/2014 Elsevier Interactive Patient Education [employer].

## 2016-10-10 ENCOUNTER — Ambulatory Visit (INDEPENDENT_AMBULATORY_CARE_PROVIDER_SITE_OTHER): Payer: Medicare Other | Admitting: Physician Assistant

## 2016-10-10 ENCOUNTER — Encounter: Payer: Self-pay | Admitting: Physician Assistant

## 2016-10-10 DIAGNOSIS — M545 Low back pain, unspecified: Secondary | ICD-10-CM

## 2016-10-10 MED ORDER — TRAMADOL HCL 50 MG PO TABS: ORAL_TABLET | ORAL | 0 refills | Status: DC

## 2016-10-10 MED ORDER — PANTOPRAZOLE SODIUM 40 MG PO TBEC: DELAYED_RELEASE_TABLET | ORAL | 1 refills | Status: DC

## 2016-10-10 NOTE — Progress Notes (Signed)
Subjective:    Patient ID: Diane Sawyer, female    DOB: 1946/07/05, 70 y.o.   MRN: [id number]  HPI Bernardina is a pleasant 70 year old white female known to Dr. Fuller Plan. She has history of diverticulosis, adenomatous colon polyps, GERD, hypertension, anxiety. She comes in today with complaints of subxiphoid pain. Last colonoscopy was done in December 2014 with finding of a 6 mm sessile polyp in the transverse colon. Biopsy proved this to be a sessile serrated adenoma, she also had moderate left-sided diverticulosis. She was to have 5 year interval follow-up. She has not had prior EGD. Patient says her current symptoms started after she fell while she was at the beach in early October. She developed acute lower back pain and was evaluated by orthopedics after returning home and was diagnosed with 2 lumbar compression fractures. She had been taking high-dose ibuprofen for a few days in addition to Robaxin and then was given a prescription for meloxicam and tramadol. She said she took meloxicam for about 12 days and then had an episode which woke her from sleep 1 night with subxiphoid pain which she described as sharp. She says she was concerned this may be related to her heart. She did not have any nausea or vomiting or shortness of breath. She went to the emergency room for evaluation and pain lasted until she received some pain medication. EKG and troponins were negative. She had upper abdominal ultrasound done which showed a mildly distended gallbladder but no gallstones and a common bile duct of 5 mm she also had a benign simple cyst in the left lobe of the liver. Labs were done and showed a WBC of 11.3 hemoglobin 13 hematocrit of 40.6 LFTs normal except for AST of 81 Patient was taken off of meloxicam, continued on Protonix once daily and had Carafate liquid 1 g 4 times daily added. Over the past 10 days or so she is done fairly well. She says that occasionally she'll have a little bit of queasiness but  nothing bad. She does feel that her back is slowly improving. She had one milder episode of subxiphoid pain that woke her from sleep one day last week. She is concerned about what she can and cannot take for pain.  Review of Systems Pertinent positive and negative review of systems were noted in the above HPI section.  All other review of systems was otherwise negative.  Outpatient Encounter Prescriptions as of 10/10/2016  Medication Sig  . acetaminophen (TYLENOL) 500 MG tablet Take 1,000 mg by mouth every 6 (six) hours as needed for mild pain.  [name] ALPRAZolam (XANAX) 0.25 MG tablet Take 1 tablet (0.25 mg total) by mouth 2 (two) times daily as needed for anxiety.  [name] amLODipine (NORVASC) 5 MG tablet Take 2.5 mg by mouth daily.  . Cholecalciferol 1000 UNITS capsule Take 1,000 Units by mouth daily.    . clobetasol cream (TEMOVATE) [id number] % Apply 1 application topically daily as needed (dermatosis). Reported on 05/23/2016  . etodolac (LODINE) 500 MG tablet Take 500 mg by mouth daily.  . fexofenadine (ALLEGRA) 180 MG tablet Take 180 mg by mouth daily.    . IRON PO Take 65 mg by mouth daily.   . methocarbamol (ROBAXIN) 500 MG tablet Take 500 mg by mouth every 8 (eight) hours as needed for muscle spasms.  . pantoprazole (PROTONIX) 40 MG tablet Take 1 tab twice daily for 1 month then go to once daily.  . sucralfate (CARAFATE) 1 GM/10ML suspension Take  10 mLs (1 g total) by mouth 4 (four) times daily -  with meals and at bedtime.  . traMADol (ULTRAM) 50 MG tablet Take 1 tablet every 6-8 hours as needed for pain  . [DISCONTINUED] pantoprazole (PROTONIX) 40 MG tablet TAKE 1 TABLET BY MOUTH EVERY DAY  . [DISCONTINUED] traMADol (ULTRAM) 50 MG tablet Take 1 tablet (50 mg total) by mouth 3 (three) times daily with meals as needed for moderate pain.  . [DISCONTINUED] amLODipine (NORVASC) 5 MG tablet TAKE 1 TABLET (5 MG TOTAL) BY MOUTH DAILY.  . [DISCONTINUED] cyanocobalamin 1000 MCG tablet Take 1,000 mcg by mouth  daily.   No facility-administered encounter medications on file as of 10/10/2016.    Allergies  Allergen Reactions  . Benazepril Hcl     REACTION: achy- makes her feel strange.  . Levofloxacin Nausea Only  . Mobic [Meloxicam] Other (See Comments)    Felt like chest going to explode   Patient Active Problem List   Diagnosis Date Noted  . Rash and nonspecific skin eruption 11/10/2014  . Left shoulder pain 11/18/2013  . Nonspecific abnormal electrocardiogram (ECG) (EKG) 11/18/2013  . Dizziness 11/18/2013  . Well adult exam 11/12/2012  . Anxiety 11/12/2012  . Hypertension 11/07/2011  . Insomnia 11/07/2011  . PHARYNGITIS 08/31/2009  . VITAMIN D DEFICIENCY 09/01/2008  . ANEMIA-IRON DEFICIENCY 09/01/2008  . ALLERGIC RHINITIS 09/01/2008  . GERD 09/01/2008  . ADENOMATOUS COLONIC POLYP 12/05/2007  . HEMORRHOIDS, INTERNAL 12/05/2007  . Hiatal hernia 12/05/2007  . DIVERTICULOSIS, COLON 12/05/2007  . OSTEOPENIA 12/05/2007   Social History   Social History  . Marital status: Married    Spouse name: N/A  . Number of children: 2  . Years of education: N/A   Occupational History  . Hair dresser    Social History Main Topics  . Smoking status: Never Smoker  . Smokeless tobacco: Never Used  . Alcohol use No  . Drug use: No  . Sexual activity: Yes    Birth control/ protection: None   Other Topics Concern  . Not on file   Social History Narrative  . No narrative on file    Ms. Icenhower's family history includes Lung cancer (age of onset: 54) in her father; Stroke (age of onset: 8) in her mother.      Objective:    Vitals:   10/10/16 1332  BP: 130/70  Pulse: 82    Physical Exam  well-developed older white female in no acute distress, quite pleasant blood pressure 130/70 pulse 82, Height 5 foot 3, weight 195, BMI 34.5. HEENT; nontraumatic normocephalic EOMI PERRLA sclera anicteric, Cardiovascular; regular rate and rhythm with S1-S2 no murmur rub or gallop, Pulmonary ;clear  bilaterally, Abdomen; obese, soft, nontender, no palpable mass or hepatosplenomegaly bowel sounds are present  Rectal ;exam not done, Extremities; no clubbing cyanosis or edema skin warm and dry, Neuropsych ;mood and affect appropriate       Assessment & Plan:   #66 70 year old white female status post recent fall sustaining lumbar compression fractures October 2017. Patient was taking high-dose NSAIDs and developed episodic subxiphoid pain, 1 episode severe , eating her from sleep or Eval in the emergency room negative for cardiac etiology, ultrasound negative. I suspect she has had an NSAID-induced gastropathy, cannot rule out peptic ulcer disease #2 chronic GERD #3 history of adenomatous colon polyps last colonoscopy December 2014, due for follow-up December 2019  Plan; we will increase Protonix to 40 mg by mouth twice a day 1 month then  if doing well back to once daily Finish current prescription for Carafate liquid 1 g 4 times daily Stop all NSAIDs Will refill tramadol 50 mg every 6-8 hours when necessary for back pain #40 and no refills Patient will follow up with Dr. Fuller Plan or myself on an not as needed basis and knows to call if she has any further episodes of pain. I suggest that she get a follow-up hepatic panel in 1 month. She has an annual physical scheduled with Dr. Alain Marion  In December and we'll have labs done at that time. If patient has any persistence of symptoms despite above regimen will need EGD.  Masayoshi Couzens S Nayra Coury PA-C 10/10/2016   Cc: Plotnikov, Evie Lacks, MD

## 2016-10-10 NOTE — Patient Instructions (Addendum)
We sent refills for Protonix and Tramadol to you pharmacy [facility] [address]  1. Pantoprazole sodium 40 mg.  Take 1 tab twice daily for 1 month then go back to once daily.  2. Tramadol 50 mg.  Take 1 tab every 6-8 hours as needed for pain.   Finish current prescription for Carafate.  No anti-inflammatories-  Aleve, Advil ,Ibuprofen ( Motrin)   Call for any recurrent or persistent symptoms.

## 2016-10-11 NOTE — Progress Notes (Signed)
Reviewed and agree with management plan.  Ozelle Brubacher T. Jerrod Damiano, MD FACG 

## 2016-10-12 ENCOUNTER — Telehealth: Payer: Self-pay | Admitting: Physician Assistant

## 2016-10-12 NOTE — Telephone Encounter (Signed)
Spoke with the patient. She had diarrhea about 4 days ago and took Imodium. She was seen here in the office for gastritis. She is on pain medicines, Carafate and muscle relaxer. She is now constipated. No bowel movement since Saturday. Consider Miralax purge. Also discussed glycerin suppository. She understands she may need to take a daily stool softener while on her present medications for her back injury and gastritis. She is otherwise feeling better. Is this okay?

## 2016-10-17 NOTE — Telephone Encounter (Signed)
yes

## 2016-10-24 ENCOUNTER — Telehealth: Payer: Self-pay | Admitting: Physician Assistant

## 2016-10-24 NOTE — Telephone Encounter (Signed)
Patient seen on 10/10/16 for possible NSAID-induced gastropathy. Her back had been injured and that is why she was taking NSAIDS. She was put on Carafate on 10/01/16 by her PCP. You increased her Protonix to BID. She then became constipated and was instructed in a Miralax purge which she reports was effective. Bowels are moving normally. She is not having any GI burning or pain.  She calls today because she has noticed her stools are very dark. Not tarry or sticky. She has not taken any OTC GI meds. She is not on iron or vitamin supplements. Please advise. She also states she has stopped Carafate over a week ago.

## 2016-10-26 ENCOUNTER — Other Ambulatory Visit: Payer: Self-pay

## 2016-10-26 ENCOUNTER — Other Ambulatory Visit (INDEPENDENT_AMBULATORY_CARE_PROVIDER_SITE_OTHER): Payer: Medicare Other

## 2016-10-26 DIAGNOSIS — D62 Acute posthemorrhagic anemia: Secondary | ICD-10-CM

## 2016-10-26 LAB — CBC WITH DIFFERENTIAL/PLATELET
BASOS PCT: 0.6 % (ref 0.0–3.0)
Basophils Absolute: 0 10*3/uL (ref 0.0–0.1)
EOS ABS: 0.2 10*3/uL (ref 0.0–0.7)
Eosinophils Relative: 3.6 % (ref 0.0–5.0)
HCT: 41.3 % (ref 36.0–46.0)
Hemoglobin: 13.6 g/dL (ref 12.0–15.0)
Lymphocytes Relative: 23.7 % (ref 12.0–46.0)
Lymphs Abs: 1.4 10*3/uL (ref 0.7–4.0)
MCHC: 32.9 g/dL (ref 30.0–36.0)
MCV: 90.2 fl (ref 78.0–100.0)
MONO ABS: 0.4 10*3/uL (ref 0.1–1.0)
Monocytes Relative: 6.9 % (ref 3.0–12.0)
NEUTROS ABS: 3.7 10*3/uL (ref 1.4–7.7)
NEUTROS PCT: 65.2 % (ref 43.0–77.0)
PLATELETS: 239 10*3/uL (ref 150.0–400.0)
RBC: 4.58 Mil/uL (ref 3.87–5.11)
RDW: 14 % (ref 11.5–15.5)
WBC: 5.8 10*3/uL (ref 4.0–10.5)

## 2016-10-26 NOTE — Telephone Encounter (Signed)
CBC is normal. Hgb 13.6. I have notified the patient. She will monitor the stools. Call us if she develops any abdominal pain or other alarming symptoms.

## 2016-10-26 NOTE — Telephone Encounter (Signed)
Spoke with the patient. Stools are still dark. She now states the stools have been dark for a while. At first she didn't want to leave work to get labs. She will try and if not today, she will come in on Friday. Understands this office is closed on 10/28/16. She declines an appointment. Doesn't feel she can leave her work today. Declines to come in next week. She is going to the beach. No Pepto-Bismol in more than a week. I have ordered a STAT CBC and have encouraged her to come in today for the test. Stressed the importance.

## 2016-10-26 NOTE — Telephone Encounter (Signed)
Please call her today - see if stools still dark.Marland Kitchenany pepto bismol?... If still dark  Needs CBC , and should see her

## 2016-11-07 DIAGNOSIS — M545 Low back pain: Secondary | ICD-10-CM | POA: Diagnosis not present

## 2016-11-18 ENCOUNTER — Other Ambulatory Visit: Payer: Self-pay | Admitting: *Deleted

## 2016-11-18 DIAGNOSIS — M546 Pain in thoracic spine: Secondary | ICD-10-CM | POA: Diagnosis not present

## 2016-11-18 DIAGNOSIS — M545 Low back pain: Secondary | ICD-10-CM | POA: Diagnosis not present

## 2016-11-21 ENCOUNTER — Ambulatory Visit (INDEPENDENT_AMBULATORY_CARE_PROVIDER_SITE_OTHER): Payer: Medicare Other | Admitting: Internal Medicine

## 2016-11-21 ENCOUNTER — Encounter: Payer: Self-pay | Admitting: Internal Medicine

## 2016-11-21 VITALS — BP 130/88 | HR 90 | Wt 192.0 lb

## 2016-11-21 DIAGNOSIS — E559 Vitamin D deficiency, unspecified: Secondary | ICD-10-CM

## 2016-11-21 DIAGNOSIS — M545 Low back pain, unspecified: Secondary | ICD-10-CM | POA: Insufficient documentation

## 2016-11-21 DIAGNOSIS — E785 Hyperlipidemia, unspecified: Secondary | ICD-10-CM

## 2016-11-21 DIAGNOSIS — F419 Anxiety disorder, unspecified: Secondary | ICD-10-CM | POA: Diagnosis not present

## 2016-11-21 DIAGNOSIS — Z23 Encounter for immunization: Secondary | ICD-10-CM | POA: Diagnosis not present

## 2016-11-21 DIAGNOSIS — Z0000 Encounter for general adult medical examination without abnormal findings: Secondary | ICD-10-CM

## 2016-11-21 DIAGNOSIS — M8580 Other specified disorders of bone density and structure, unspecified site: Secondary | ICD-10-CM

## 2016-11-21 MED ORDER — ALPRAZOLAM 0.25 MG PO TABS
0.2500 mg | ORAL_TABLET | Freq: Two times a day (BID) | ORAL | 3 refills | Status: DC | PRN
Start: 2016-11-21 — End: 2017-12-11

## 2016-11-21 NOTE — Assessment & Plan Note (Signed)
s/p fall - lumbar compr fx (Murphy-Wainer) MRI thor and lumber were done, results pending Ortho appt pending

## 2016-11-21 NOTE — Assessment & Plan Note (Signed)
H/o Boniva use On Vit D 10/17 s/p fall - lumbar compr fx (Murphy-Wainer) She will check w/Orho if she needs to re-start [facility]

## 2016-11-21 NOTE — Assessment & Plan Note (Signed)
Xanax Prn  Potential benefits of a long term benzodiazepines  use as well as potential risks  and complications were explained to the patient and were aknowledged.

## 2016-11-21 NOTE — Progress Notes (Signed)
Pre visit review using our clinic review tool, if applicable. No additional management support is needed unless otherwise documented below in the visit note. 

## 2016-11-21 NOTE — Progress Notes (Signed)
Subjective:  Patient ID: Diane Sawyer, female    DOB: 23-Jun-1946  Age: 70 y.o. MRN: [id number]  CC: No chief complaint on file.   HPI Diane Sawyer presents for OA, anxiety, GERD f/u. S/p fall - lumbar compr fx (Murphy-Wainer)  Outpatient Medications Prior to Visit  Medication Sig Dispense Refill  . acetaminophen (TYLENOL) 500 MG tablet Take 1,000 mg by mouth every 6 (six) hours as needed for mild pain.    [name] ALPRAZolam (XANAX) 0.25 MG tablet Take 1 tablet (0.25 mg total) by mouth 2 (two) times daily as needed for anxiety. 60 tablet 3  . amLODipine (NORVASC) 5 MG tablet Take 2.5 mg by mouth daily.    . Cholecalciferol 1000 UNITS capsule Take 1,000 Units by mouth daily.      . clobetasol cream (TEMOVATE) [id number] % Apply 1 application topically daily as needed (dermatosis). Reported on 05/23/2016    . etodolac (LODINE) 500 MG tablet Take 500 mg by mouth daily.    . fexofenadine (ALLEGRA) 180 MG tablet Take 180 mg by mouth daily.      . IRON PO Take 65 mg by mouth daily.     . methocarbamol (ROBAXIN) 500 MG tablet Take 500 mg by mouth every 8 (eight) hours as needed for muscle spasms.    . pantoprazole (PROTONIX) 40 MG tablet Take 1 tab twice daily for 1 month then go to once daily. 60 tablet 1  . traMADol (ULTRAM) 50 MG tablet Take 1 tablet every 6-8 hours as needed for pain 40 tablet 0  . sucralfate (CARAFATE) 1 GM/10ML suspension Take 10 mLs (1 g total) by mouth 4 (four) times daily -  with meals and at bedtime. (Patient not taking: Reported on 11/21/2016) 420 mL 0   No facility-administered medications prior to visit.     ROS Review of Systems  Constitutional: Negative for activity change, appetite change, chills, fatigue and unexpected weight change.  HENT: Negative for congestion, mouth sores and sinus pressure.   Eyes: Negative for visual disturbance.  Respiratory: Negative for cough and chest tightness.   Gastrointestinal: Negative for abdominal pain and nausea.  Genitourinary:  Negative for difficulty urinating, frequency and vaginal pain.  Musculoskeletal: Negative for back pain and gait problem.  Skin: Negative for pallor and rash.  Neurological: Negative for dizziness, tremors, weakness, numbness and headaches.  Psychiatric/Behavioral: Negative for confusion and sleep disturbance.    Objective:  BP 130/88   Pulse 90   Wt 192 lb (87.1 kg)   LMP  (Exact Date)   SpO2 96%   BMI 34.01 kg/m   BP Readings from Last 3 Encounters:  11/21/16 130/88  10/10/16 130/70  10/03/16 138/66    Wt Readings from Last 3 Encounters:  11/21/16 192 lb (87.1 kg)  10/10/16 195 lb (88.5 kg)  10/03/16 193 lb (87.5 kg)    Physical Exam  Constitutional: She appears well-developed. No distress.  HENT:  Head: Normocephalic.  Right Ear: External ear normal.  Left Ear: External ear normal.  Nose: Nose normal.  Mouth/Throat: Oropharynx is clear and moist.  Eyes: Conjunctivae are normal. Pupils are equal, round, and reactive to light. Right eye exhibits no discharge. Left eye exhibits no discharge.  Neck: Normal range of motion. Neck supple. No JVD present. No tracheal deviation present. No thyromegaly present.  Cardiovascular: Normal rate, regular rhythm and normal heart sounds.   Pulmonary/Chest: No stridor. No respiratory distress. She has no wheezes.  Abdominal: Soft. Bowel sounds are normal. She  exhibits no distension and no mass. There is no tenderness. There is no rebound and no guarding.  Musculoskeletal: She exhibits tenderness. She exhibits no edema.  Lymphadenopathy:    She has no cervical adenopathy.  Neurological: She displays normal reflexes. No cranial nerve deficit. She exhibits normal muscle tone. Coordination normal.  Skin: No rash noted. No erythema.  Psychiatric: She has a normal mood and affect. Her behavior is normal. Judgment and thought content normal.  LS/thor - NT  Lab Results  Component Value Date   WBC 5.8 10/26/2016   HGB 13.6 10/26/2016    HCT 41.3 10/26/2016   PLT 239.0 10/26/2016   GLUCOSE 113 (H) 10/01/2016   CHOL 170 11/09/2015   TRIG 97.0 11/09/2015   HDL 52.80 11/09/2015   LDLCALC 98 11/09/2015   ALT 45 10/01/2016   AST 81 (H) 10/01/2016   NA 141 10/01/2016   K 3.7 10/01/2016   CL 109 10/01/2016   CREATININE 0.78 10/01/2016   BUN 15 10/01/2016   CO2 24 10/01/2016   TSH 0.87 11/09/2015    Dg Abd Acute W/chest  Result Date: 10/01/2016 CLINICAL DATA:  70 year old female with abdominal pain. EXAM: DG ABDOMEN ACUTE W/ 1V CHEST COMPARISON:  CT dated 10/31/2007 FINDINGS: The lungs are clear. There is no pleural effusion or pneumothorax. The cardiac silhouette is within normal limits. Retrocardiac density corresponds to the hiatal hernia seen on the prior CT. There is osteopenia. No acute osseous pathology. There is no bowel dilatation or evidence of obstruction. No free air or radiopaque calculi. The osseous structures and soft tissues appear unremarkable. IMPRESSION: Negative abdominal radiographs.  No acute cardiopulmonary disease. Electronically Signed   By: Anner Crete M.D.   On: 10/01/2016 06:31   US Abdomen Limited Ruq  Result Date: 10/01/2016 CLINICAL DATA:  Abdominal pain this morning. EXAM: US ABDOMEN LIMITED - RIGHT UPPER QUADRANT COMPARISON:  10/31/2007 CT abdomen/pelvis. FINDINGS: Gallbladder: Mildly distended gallbladder. No gallbladder wall thickening. Mild-to-moderate layering sludge. No gallstones. No pericholecystic fluid or sonographic Murphy's sign. Common bile duct: Diameter: 5 mm Liver: Simple lobulated 2.3 x 2.1 x 2.2 cm posterior left liver lobe cyst. No additional liver lesions. Background liver parenchymal echogenicity and echotexture are within normal limits. IMPRESSION: 1. Gallbladder sludge. No cholelithiasis. No evidence of acute cholecystitis. 2. No biliary ductal dilatation. 3. Benign left liver lobe cyst.  Otherwise normal liver. Electronically Signed   By: Ilona Sorrel M.D.   On:  10/01/2016 07:44    Assessment & Plan:   There are no diagnoses linked to this encounter. I am having Diane Sawyer maintain her IRON PO, Cholecalciferol, fexofenadine, clobetasol cream, ALPRAZolam, methocarbamol, acetaminophen, sucralfate, amLODipine, etodolac, pantoprazole, and traMADol.  No orders of the defined types were placed in this encounter.    Follow-up: No Follow-up on file.  Walker Kehr, MD

## 2016-11-21 NOTE — Addendum Note (Signed)
Addended by: Cresenciano Lick on: 11/21/2016 12:45 PM   Modules accepted: Orders

## 2016-11-21 NOTE — Assessment & Plan Note (Signed)
On Vit D 

## 2016-12-02 DIAGNOSIS — M545 Low back pain: Secondary | ICD-10-CM | POA: Diagnosis not present

## 2016-12-05 DIAGNOSIS — H269 Unspecified cataract: Secondary | ICD-10-CM

## 2016-12-05 HISTORY — DX: Unspecified cataract: H26.9

## 2016-12-05 HISTORY — PX: OTHER SURGICAL HISTORY: SHX169

## 2016-12-13 DIAGNOSIS — M545 Low back pain: Secondary | ICD-10-CM | POA: Diagnosis not present

## 2016-12-16 DIAGNOSIS — M545 Low back pain: Secondary | ICD-10-CM | POA: Diagnosis not present

## 2016-12-20 DIAGNOSIS — M545 Low back pain: Secondary | ICD-10-CM | POA: Diagnosis not present

## 2016-12-27 DIAGNOSIS — M545 Low back pain: Secondary | ICD-10-CM | POA: Diagnosis not present

## 2016-12-29 DIAGNOSIS — M545 Low back pain: Secondary | ICD-10-CM | POA: Diagnosis not present

## 2017-01-04 DIAGNOSIS — M545 Low back pain: Secondary | ICD-10-CM | POA: Diagnosis not present

## 2017-01-06 DIAGNOSIS — M545 Low back pain: Secondary | ICD-10-CM | POA: Diagnosis not present

## 2017-01-10 DIAGNOSIS — M545 Low back pain: Secondary | ICD-10-CM | POA: Diagnosis not present

## 2017-01-13 DIAGNOSIS — M545 Low back pain: Secondary | ICD-10-CM | POA: Diagnosis not present

## 2017-01-17 DIAGNOSIS — M545 Low back pain: Secondary | ICD-10-CM | POA: Diagnosis not present

## 2017-01-20 DIAGNOSIS — M545 Low back pain: Secondary | ICD-10-CM | POA: Diagnosis not present

## 2017-01-24 DIAGNOSIS — M545 Low back pain: Secondary | ICD-10-CM | POA: Diagnosis not present

## 2017-01-25 DIAGNOSIS — M545 Low back pain: Secondary | ICD-10-CM | POA: Diagnosis not present

## 2017-02-20 DIAGNOSIS — H401131 Primary open-angle glaucoma, bilateral, mild stage: Secondary | ICD-10-CM | POA: Diagnosis not present

## 2017-02-20 DIAGNOSIS — H401130 Primary open-angle glaucoma, bilateral, stage unspecified: Secondary | ICD-10-CM | POA: Diagnosis not present

## 2017-03-07 DIAGNOSIS — H2512 Age-related nuclear cataract, left eye: Secondary | ICD-10-CM | POA: Diagnosis not present

## 2017-03-14 DIAGNOSIS — H401132 Primary open-angle glaucoma, bilateral, moderate stage: Secondary | ICD-10-CM | POA: Diagnosis not present

## 2017-03-14 DIAGNOSIS — H25813 Combined forms of age-related cataract, bilateral: Secondary | ICD-10-CM | POA: Diagnosis not present

## 2017-03-14 DIAGNOSIS — H2512 Age-related nuclear cataract, left eye: Secondary | ICD-10-CM | POA: Diagnosis not present

## 2017-03-22 ENCOUNTER — Telehealth: Payer: Self-pay | Admitting: Internal Medicine

## 2017-03-22 NOTE — Telephone Encounter (Signed)
Spoke with patient regarding awv. Pt stated that she is not interested in scheduling awv at this time.

## 2017-04-06 DIAGNOSIS — H25812 Combined forms of age-related cataract, left eye: Secondary | ICD-10-CM | POA: Diagnosis not present

## 2017-04-06 DIAGNOSIS — H401122 Primary open-angle glaucoma, left eye, moderate stage: Secondary | ICD-10-CM | POA: Diagnosis not present

## 2017-04-06 DIAGNOSIS — H268 Other specified cataract: Secondary | ICD-10-CM | POA: Diagnosis not present

## 2017-04-06 DIAGNOSIS — H2512 Age-related nuclear cataract, left eye: Secondary | ICD-10-CM | POA: Diagnosis not present

## 2017-04-06 HISTORY — PX: CATARACT EXTRACTION, BILATERAL: SHX1313

## 2017-04-10 DIAGNOSIS — H4052X2 Glaucoma secondary to other eye disorders, left eye, moderate stage: Secondary | ICD-10-CM | POA: Diagnosis not present

## 2017-04-10 DIAGNOSIS — H2511 Age-related nuclear cataract, right eye: Secondary | ICD-10-CM | POA: Diagnosis not present

## 2017-04-10 DIAGNOSIS — H2012 Chronic iridocyclitis, left eye: Secondary | ICD-10-CM | POA: Diagnosis not present

## 2017-04-10 DIAGNOSIS — H401131 Primary open-angle glaucoma, bilateral, mild stage: Secondary | ICD-10-CM | POA: Diagnosis not present

## 2017-04-11 DIAGNOSIS — H401131 Primary open-angle glaucoma, bilateral, mild stage: Secondary | ICD-10-CM | POA: Diagnosis not present

## 2017-04-11 DIAGNOSIS — H4052X2 Glaucoma secondary to other eye disorders, left eye, moderate stage: Secondary | ICD-10-CM | POA: Diagnosis not present

## 2017-04-11 DIAGNOSIS — H2012 Chronic iridocyclitis, left eye: Secondary | ICD-10-CM | POA: Diagnosis not present

## 2017-04-11 DIAGNOSIS — H2102 Hyphema, left eye: Secondary | ICD-10-CM | POA: Diagnosis not present

## 2017-05-11 IMAGING — CR DG ABDOMEN ACUTE W/ 1V CHEST
4 series · 4 of 4 positions shown · non-contrast
Comparison: CT dated 10/31/2007

CLINICAL DATA: 70-year-old female with abdominal pain.

EXAM:
DG ABDOMEN ACUTE W/ 1V CHEST

[chest pa]
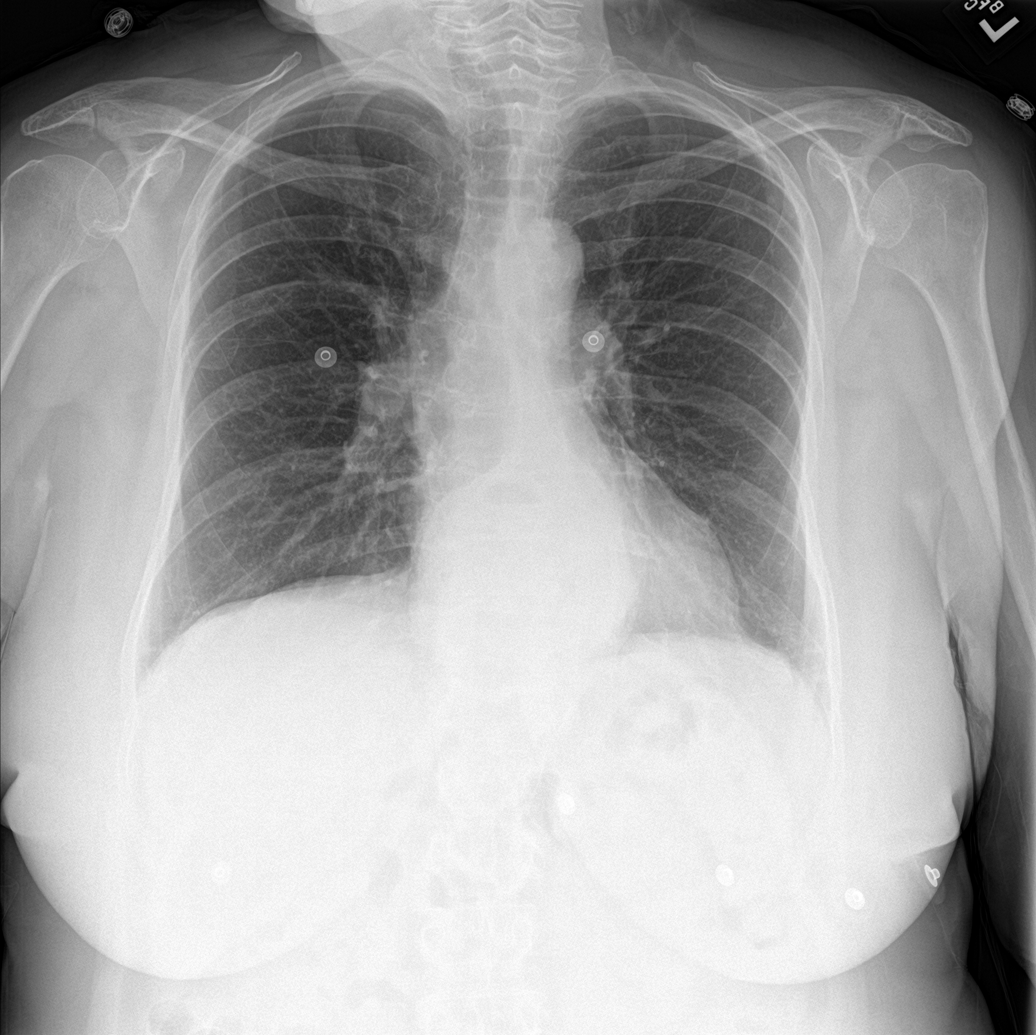

[abdomen erect]
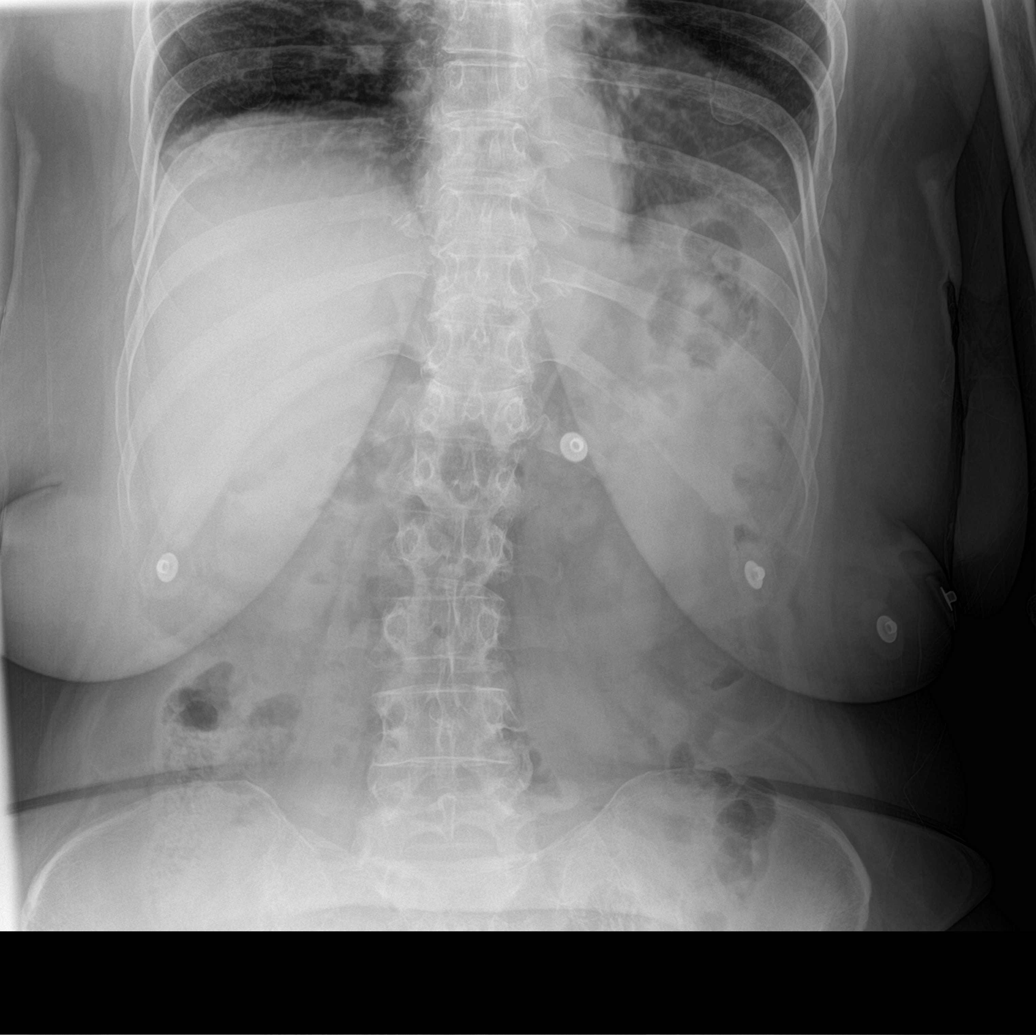

[abdomen supine (1 of 2)]
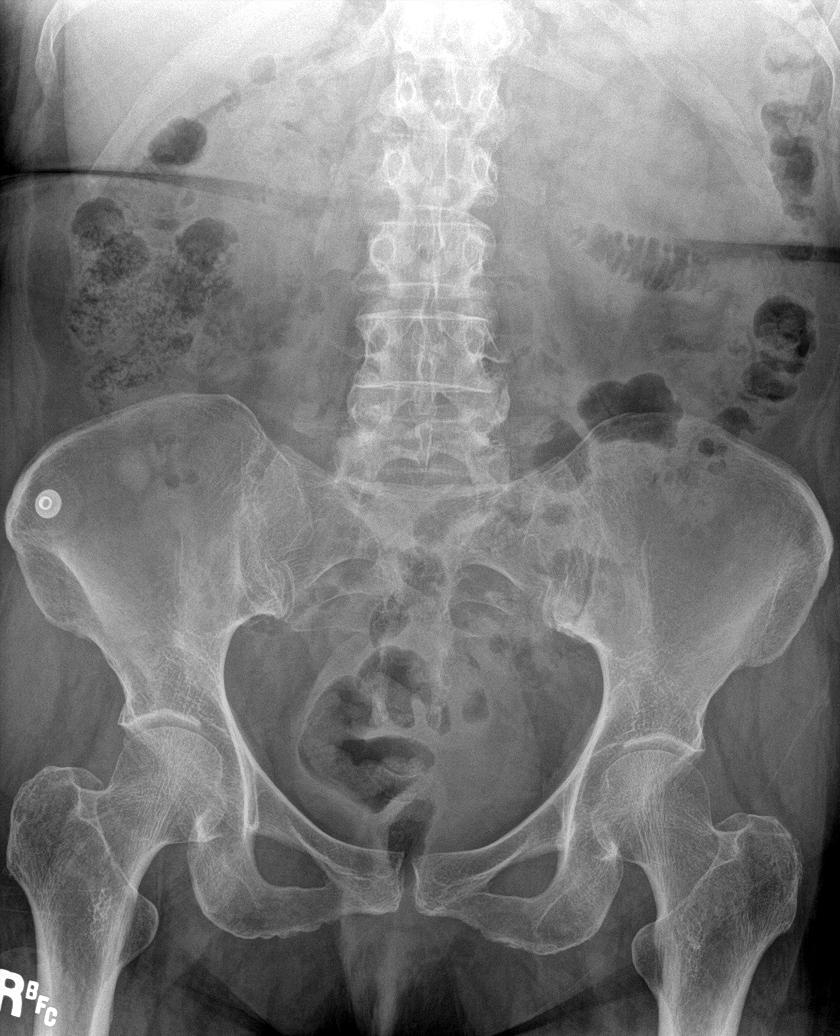

[abdomen supine (2 of 2)]
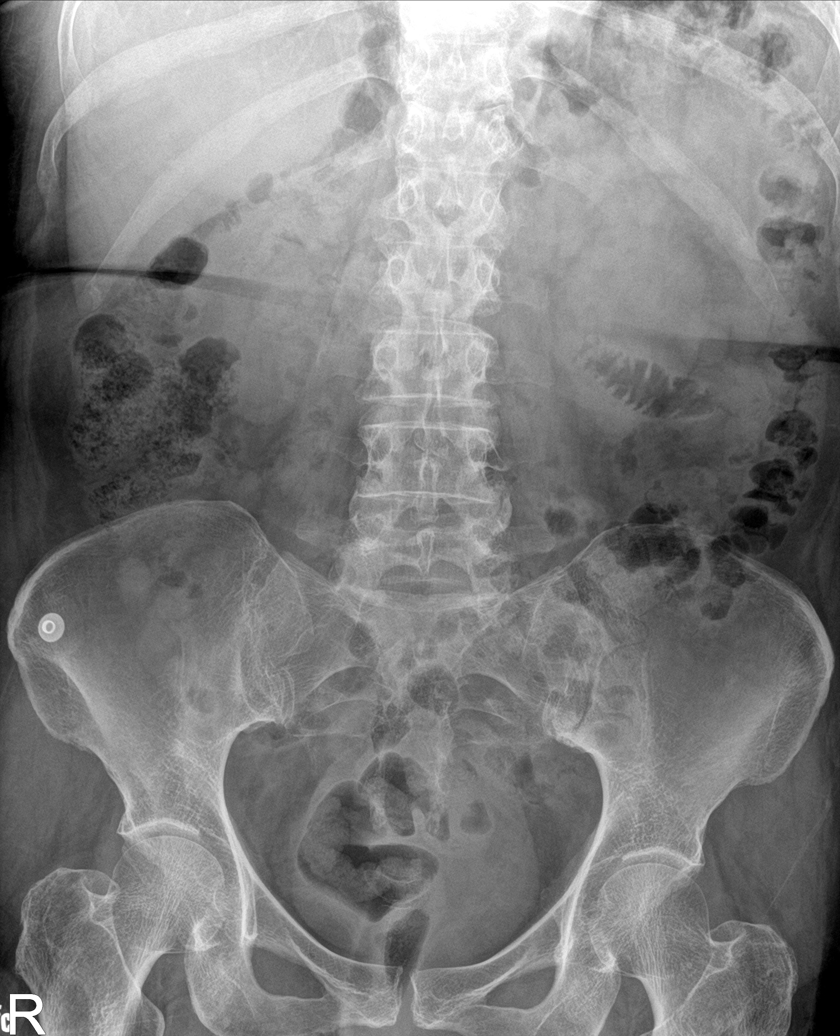

[4 of 4 positions shown; findings below may reference images not displayed]

FINDINGS: The lungs are clear. There is no pleural effusion or pneumothorax.
The cardiac silhouette is within normal limits. Retrocardiac density
corresponds to the hiatal hernia seen on the prior CT. There is
osteopenia. No acute osseous pathology.

There is no bowel dilatation or evidence of obstruction. No free air
or radiopaque calculi. The osseous structures and soft tissues
appear unremarkable.
IMPRESSION: Negative abdominal radiographs.  No acute cardiopulmonary disease.

## 2017-05-22 ENCOUNTER — Ambulatory Visit: Payer: Medicare Other | Admitting: Internal Medicine

## 2017-05-29 ENCOUNTER — Ambulatory Visit: Payer: Medicare Other | Admitting: Internal Medicine

## 2017-06-01 IMAGING — US US ABDOMEN LIMITED
1 series · 14 of 25 positions shown · non-contrast
Comparison: 10/31/2007 CT abdomen/pelvis.

CLINICAL DATA: Abdominal pain this morning.

EXAM:
US ABDOMEN LIMITED - RIGHT UPPER QUADRANT

[Series 1: us abdomen limited · 0.25mm/px · 14 of 27 slices shown]
[im 1/27]
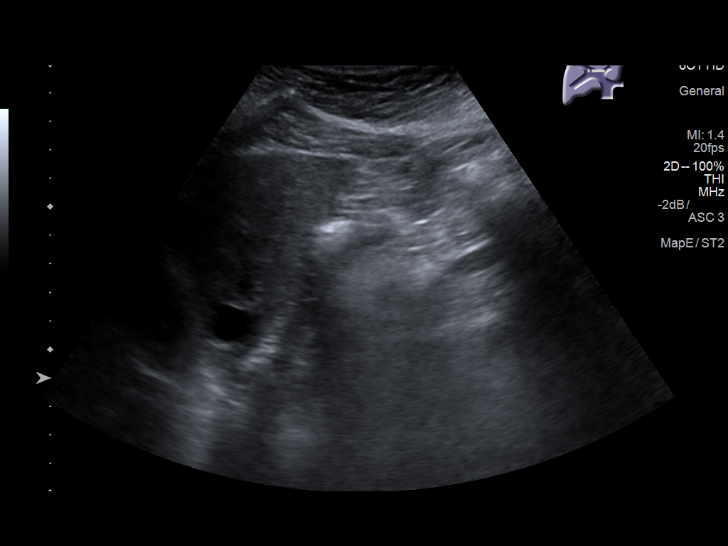
[im 3/27]
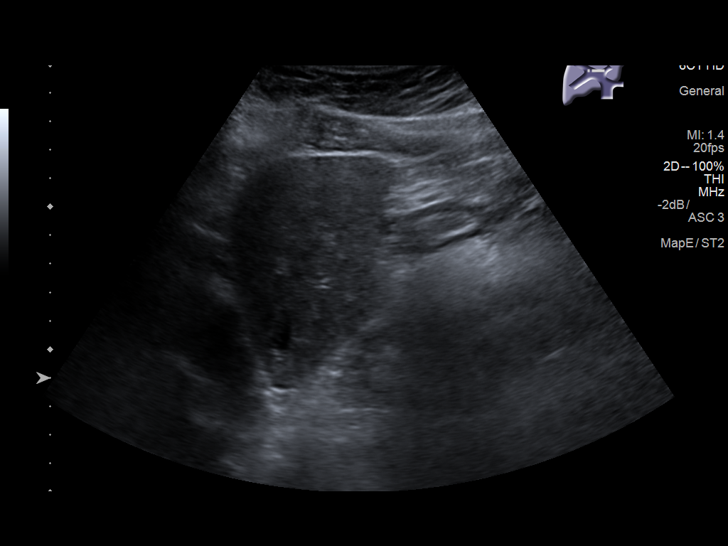
[im 5/27]
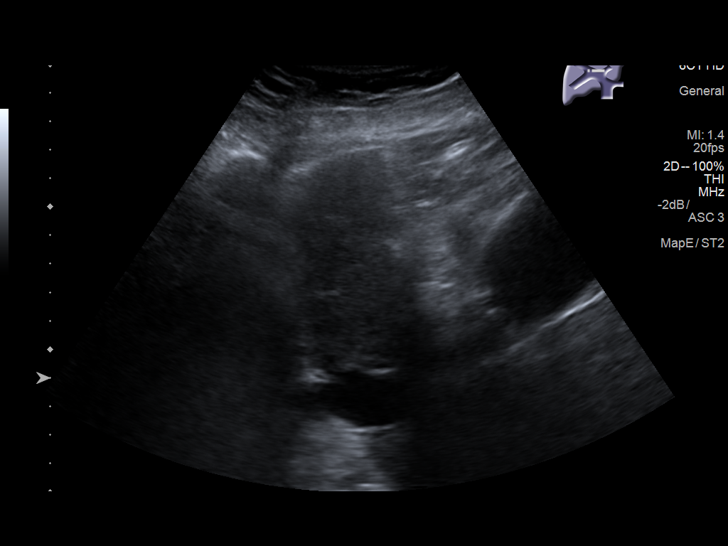
[im 7/27]
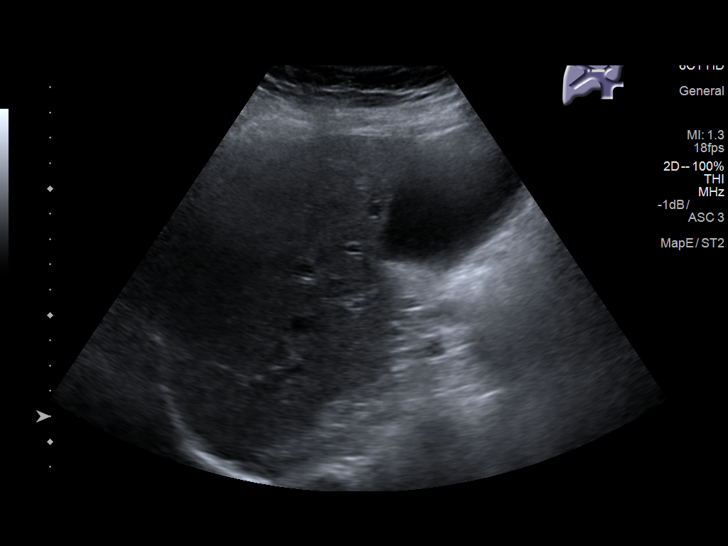
[im 9/27]
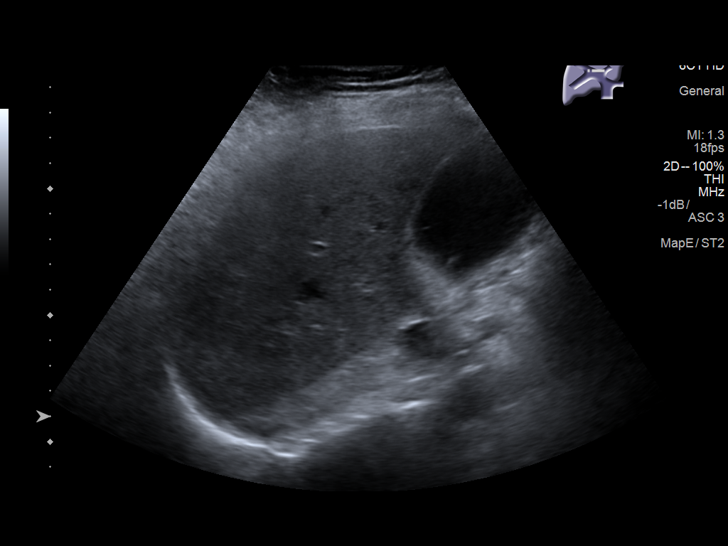
[im 10/27]
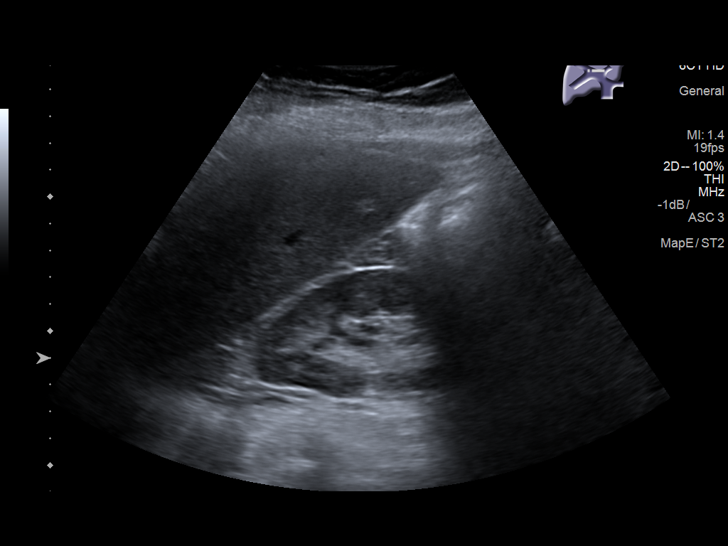
[im 12/27]
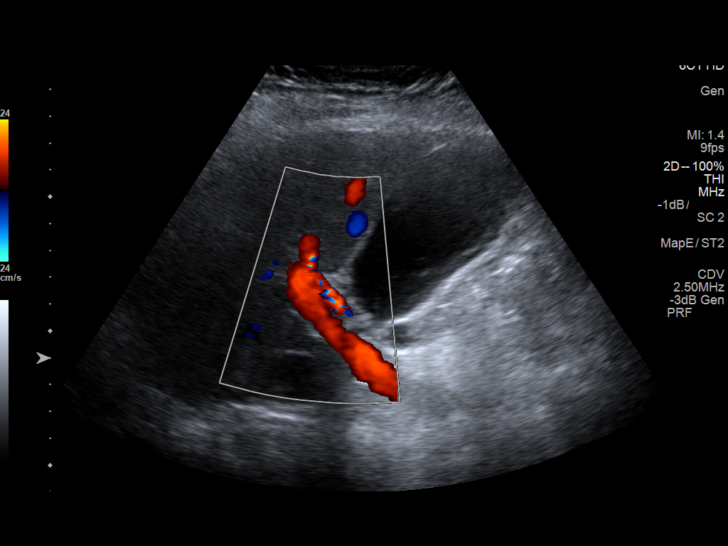
[im 15/27]
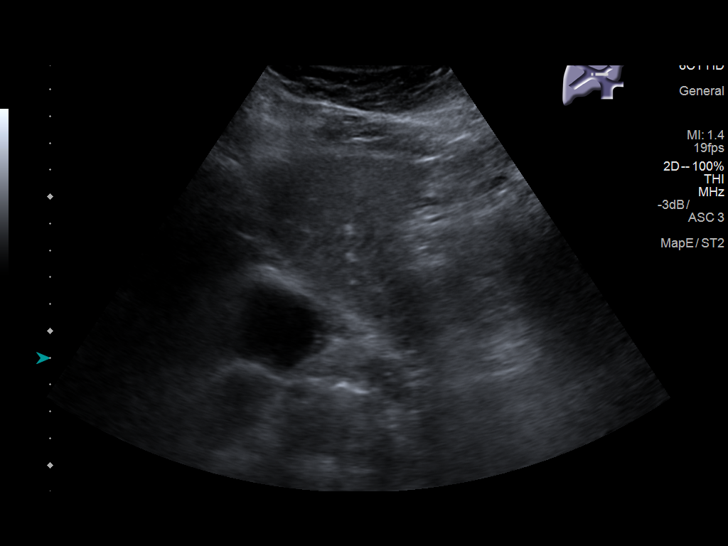
[im 17/27]
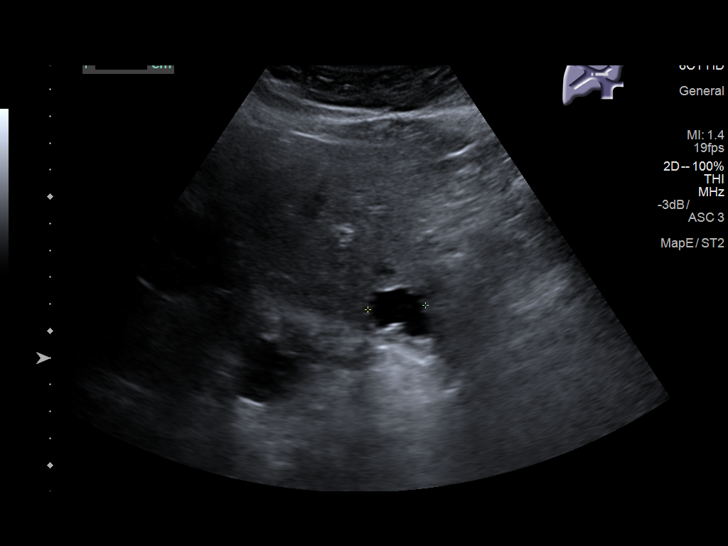
[im 18/27]
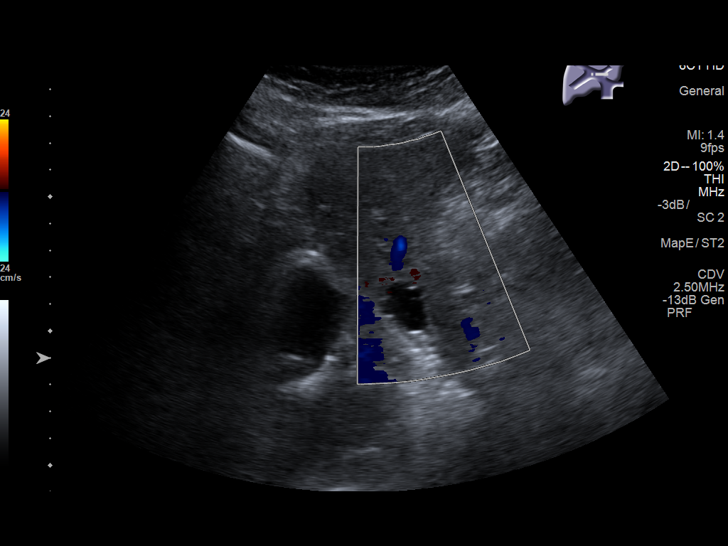
[im 20/27]
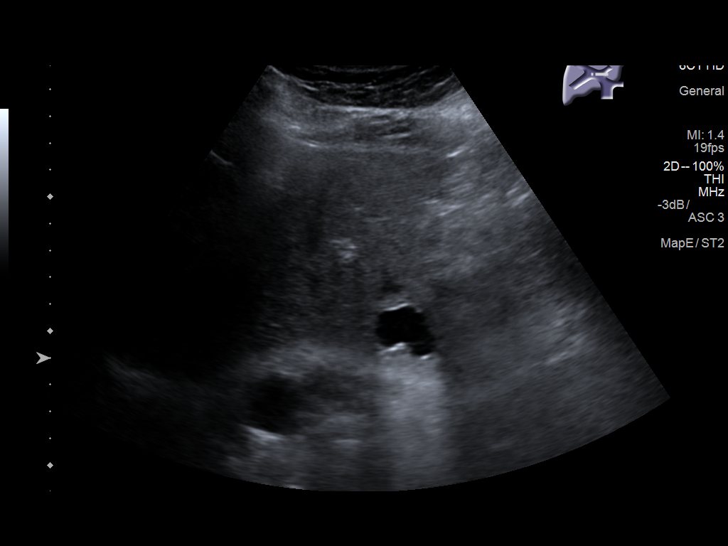
[im 22/27]
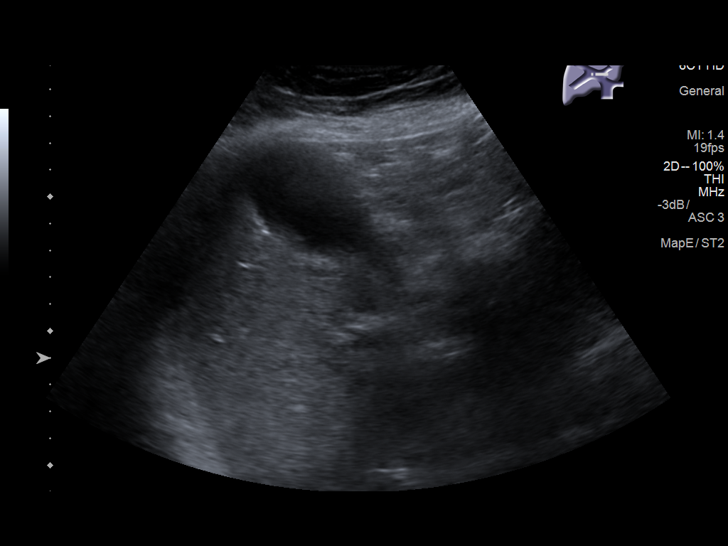
[im 24/27]
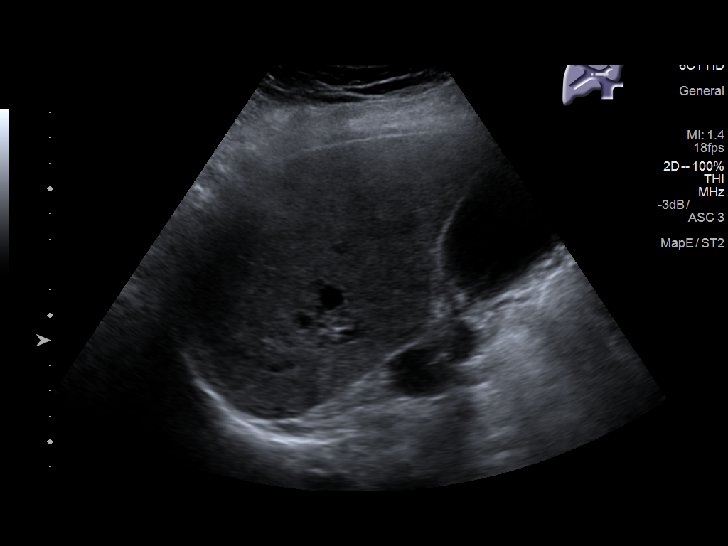
[im 27/27]
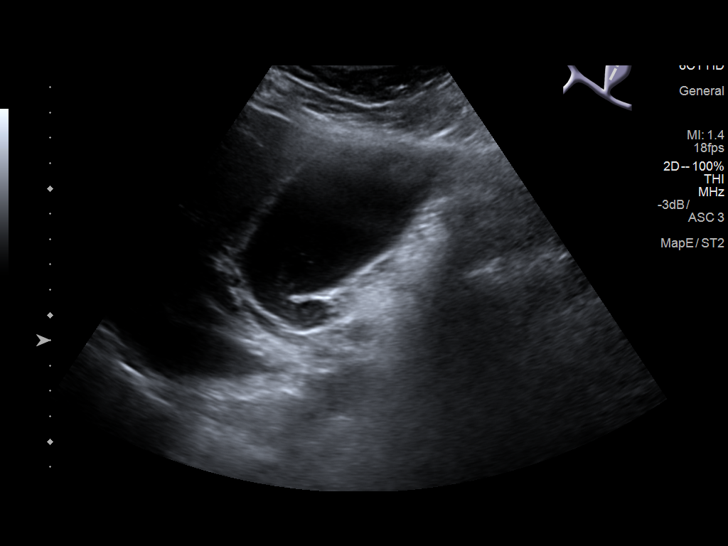

[14 of 25 positions shown; findings below may reference images not displayed]

FINDINGS: Gallbladder:

Mildly distended gallbladder. No gallbladder wall thickening.
Mild-to-moderate layering sludge. No gallstones. No pericholecystic
fluid or sonographic Murphy's sign.

Common bile duct:

Diameter: 5 mm

Liver:

Simple lobulated 2.3 x 2.1 x 2.2 cm posterior left liver lobe cyst.
No additional liver lesions. Background liver parenchymal
echogenicity and echotexture are within normal limits.
IMPRESSION: 1. Gallbladder sludge. No cholelithiasis. No evidence of acute
cholecystitis.
2. No biliary ductal dilatation.
3. Benign left liver lobe cyst.  Otherwise normal liver.

## 2017-06-12 ENCOUNTER — Ambulatory Visit (INDEPENDENT_AMBULATORY_CARE_PROVIDER_SITE_OTHER): Payer: Medicare Other | Admitting: Internal Medicine

## 2017-06-12 ENCOUNTER — Encounter: Payer: Self-pay | Admitting: Internal Medicine

## 2017-06-12 ENCOUNTER — Other Ambulatory Visit (INDEPENDENT_AMBULATORY_CARE_PROVIDER_SITE_OTHER): Payer: Medicare Other

## 2017-06-12 VITALS — BP 134/86 | HR 81 | Temp 98.1°F | Ht 63.0 in | Wt 193.0 lb

## 2017-06-12 DIAGNOSIS — Z0000 Encounter for general adult medical examination without abnormal findings: Secondary | ICD-10-CM

## 2017-06-12 DIAGNOSIS — D509 Iron deficiency anemia, unspecified: Secondary | ICD-10-CM

## 2017-06-12 DIAGNOSIS — M8580 Other specified disorders of bone density and structure, unspecified site: Secondary | ICD-10-CM

## 2017-06-12 DIAGNOSIS — E559 Vitamin D deficiency, unspecified: Secondary | ICD-10-CM

## 2017-06-12 DIAGNOSIS — E785 Hyperlipidemia, unspecified: Secondary | ICD-10-CM | POA: Diagnosis not present

## 2017-06-12 DIAGNOSIS — F419 Anxiety disorder, unspecified: Secondary | ICD-10-CM

## 2017-06-12 DIAGNOSIS — M545 Low back pain, unspecified: Secondary | ICD-10-CM

## 2017-06-12 LAB — CBC WITH DIFFERENTIAL/PLATELET
Basophils Absolute: 0.1 10*3/uL (ref 0.0–0.1)
Basophils Relative: 0.9 % (ref 0.0–3.0)
EOS PCT: 2.7 % (ref 0.0–5.0)
Eosinophils Absolute: 0.2 10*3/uL (ref 0.0–0.7)
HCT: 41.9 % (ref 36.0–46.0)
Hemoglobin: 14 g/dL (ref 12.0–15.0)
LYMPHS ABS: 1.8 10*3/uL (ref 0.7–4.0)
Lymphocytes Relative: 28.2 % (ref 12.0–46.0)
MCHC: 33.5 g/dL (ref 30.0–36.0)
MCV: 90.4 fl (ref 78.0–100.0)
MONO ABS: 0.5 10*3/uL (ref 0.1–1.0)
MONOS PCT: 7.2 % (ref 3.0–12.0)
NEUTROS ABS: 3.8 10*3/uL (ref 1.4–7.7)
NEUTROS PCT: 61 % (ref 43.0–77.0)
PLATELETS: 238 10*3/uL (ref 150.0–400.0)
RBC: 4.63 Mil/uL (ref 3.87–5.11)
RDW: 13.5 % (ref 11.5–15.5)
WBC: 6.3 10*3/uL (ref 4.0–10.5)

## 2017-06-12 LAB — HEPATIC FUNCTION PANEL
ALBUMIN: 4.3 g/dL (ref 3.5–5.2)
ALT: 9 U/L (ref 0–35)
AST: 13 U/L (ref 0–37)
Alkaline Phosphatase: 67 U/L (ref 39–117)
BILIRUBIN TOTAL: 0.3 mg/dL (ref 0.2–1.2)
Bilirubin, Direct: 0 mg/dL (ref 0.0–0.3)
Total Protein: 7.2 g/dL (ref 6.0–8.3)

## 2017-06-12 LAB — BASIC METABOLIC PANEL
BUN: 15 mg/dL (ref 6–23)
CO2: 28 meq/L (ref 19–32)
Calcium: 9.2 mg/dL (ref 8.4–10.5)
Chloride: 104 mEq/L (ref 96–112)
Creatinine, Ser: 0.75 mg/dL (ref 0.40–1.20)
GFR: 81.21 mL/min (ref >60.00)
GLUCOSE: 90 mg/dL (ref 70–99)
POTASSIUM: 4.4 meq/L (ref 3.5–5.1)
SODIUM: 139 meq/L (ref 135–145)

## 2017-06-12 LAB — LIPID PANEL
CHOLESTEROL: 186 mg/dL (ref 0–200)
HDL: 66.2 mg/dL (ref >39.00)
LDL CALC: 97 mg/dL (ref 0–99)
NonHDL: 120.11
TRIGLYCERIDES: 118 mg/dL (ref 0.0–149.0)
Total CHOL/HDL Ratio: 3
VLDL: 23.6 mg/dL (ref 0.0–40.0)

## 2017-06-12 LAB — TSH: TSH: 0.75 u[IU]/mL (ref 0.35–4.50)

## 2017-06-12 LAB — VITAMIN D 25 HYDROXY (VIT D DEFICIENCY, FRACTURES): VITD: 38.3 ng/mL (ref 30.00–100.00)

## 2017-06-12 MED ORDER — METHOCARBAMOL 500 MG PO TABS
500.0000 mg | ORAL_TABLET | Freq: Three times a day (TID) | ORAL | 1 refills | Status: DC | PRN
Start: 2017-06-12 — End: 2019-10-21

## 2017-06-12 NOTE — Patient Instructions (Addendum)
Chair yoga  Prolia shots Health Maintenance for Postmenopausal Women Menopause is a normal process in which your reproductive ability comes to an end. This process happens gradually over a span of months to years, usually between the ages of 55 and 68. Menopause is complete when you have missed 12 consecutive menstrual periods. It is important to talk with your health care provider about some of the most common conditions that affect postmenopausal women, such as heart disease, cancer, and bone loss (osteoporosis). Adopting a healthy lifestyle and getting preventive care can help to promote your health and wellness. Those actions can also lower your chances of developing some of these common conditions. What should I know about menopause? During menopause, you may experience a number of symptoms, such as:  Moderate-to-severe hot flashes.  Night sweats.  Decrease in sex drive.  Mood swings.  Headaches.  Tiredness.  Irritability.  Memory problems.  Insomnia.  Choosing to treat or not to treat menopausal changes is an individual decision that you make with your health care provider. What should I know about hormone replacement therapy and supplements? Hormone therapy products are effective for treating symptoms that are associated with menopause, such as hot flashes and night sweats. Hormone replacement carries certain risks, especially as you become older. If you are thinking about using estrogen or estrogen with progestin treatments, discuss the benefits and risks with your health care provider. What should I know about heart disease and stroke? Heart disease, heart attack, and stroke become more likely as you age. This may be due, in part, to the hormonal changes that your body experiences during menopause. These can affect how your body processes dietary fats, triglycerides, and cholesterol. Heart attack and stroke are both medical emergencies. There are many things that you can do  to help prevent heart disease and stroke:  Have your blood pressure checked at least every 1-2 years. High blood pressure causes heart disease and increases the risk of stroke.  If you are 26-76 years old, ask your health care provider if you should take aspirin to prevent a heart attack or a stroke.  Do not use any tobacco products, including cigarettes, chewing tobacco, or electronic cigarettes. If you need help quitting, ask your health care provider.  It is important to eat a healthy diet and maintain a healthy weight. ? Be sure to include plenty of vegetables, fruits, low-fat dairy products, and lean protein. ? Avoid eating foods that are high in solid fats, added sugars, or salt (sodium).  Get regular exercise. This is one of the most important things that you can do for your health. ? Try to exercise for at least 150 minutes each week. The type of exercise that you do should increase your heart rate and make you sweat. This is known as moderate-intensity exercise. ? Try to do strengthening exercises at least twice each week. Do these in addition to the moderate-intensity exercise.  Know your numbers.Ask your health care provider to check your cholesterol and your blood glucose. Continue to have your blood tested as directed by your health care provider.  What should I know about cancer screening? There are several types of cancer. Take the following steps to reduce your risk and to catch any cancer development as early as possible. Breast Cancer  Practice breast self-awareness. ? This means understanding how your breasts normally appear and feel. ? It also means doing regular breast self-exams. Let your health care provider know about any changes, no matter how small.  If you are 40 or older, have a clinician do a breast exam (clinical breast exam or CBE) every year. Depending on your age, family history, and medical history, it may be recommended that you also have a yearly breast  X-ray (mammogram).  If you have a family history of breast cancer, talk with your health care provider about genetic screening.  If you are at high risk for breast cancer, talk with your health care provider about having an MRI and a mammogram every year.  Breast cancer (BRCA) gene test is recommended for women who have family members with BRCA-related cancers. Results of the assessment will determine the need for genetic counseling and BRCA1 and for BRCA2 testing. BRCA-related cancers include these types: ? Breast. This occurs in males or females. ? Ovarian. ? Tubal. This may also be called fallopian tube cancer. ? Cancer of the abdominal or pelvic lining (peritoneal cancer). ? Prostate. ? Pancreatic.  Cervical, Uterine, and Ovarian Cancer Your health care provider may recommend that you be screened regularly for cancer of the pelvic organs. These include your ovaries, uterus, and vagina. This screening involves a pelvic exam, which includes checking for microscopic changes to the surface of your cervix (Pap test).  For women ages 21-65, health care providers may recommend a pelvic exam and a Pap test every three years. For women ages 30-65, they may recommend the Pap test and pelvic exam, combined with testing for human papilloma virus (HPV), every five years. Some types of HPV increase your risk of cervical cancer. Testing for HPV may also be done on women of any age who have unclear Pap test results.  Other health care providers may not recommend any screening for nonpregnant women who are considered low risk for pelvic cancer and have no symptoms. Ask your health care provider if a screening pelvic exam is right for you.  If you have had past treatment for cervical cancer or a condition that could lead to cancer, you need Pap tests and screening for cancer for at least 20 years after your treatment. If Pap tests have been discontinued for you, your risk factors (such as having a new sexual  partner) need to be reassessed to determine if you should start having screenings again. Some women have medical problems that increase the chance of getting cervical cancer. In these cases, your health care provider may recommend that you have screening and Pap tests more often.  If you have a family history of uterine cancer or ovarian cancer, talk with your health care provider about genetic screening.  If you have vaginal bleeding after reaching menopause, tell your health care provider.  There are currently no reliable tests available to screen for ovarian cancer.  Lung Cancer Lung cancer screening is recommended for adults 55-80 years old who are at high risk for lung cancer because of a history of smoking. A yearly low-dose CT scan of the lungs is recommended if you:  Currently smoke.  Have a history of at least 30 pack-years of smoking and you currently smoke or have quit within the past 15 years. A pack-year is smoking an average of one pack of cigarettes per day for one year.  Yearly screening should:  Continue until it has been 15 years since you quit.  Stop if you develop a health problem that would prevent you from having lung cancer treatment.  Colorectal Cancer  This type of cancer can be detected and can often be prevented.  Routine colorectal cancer   screening usually begins at age 50 and continues through age 75.  If you have risk factors for colon cancer, your health care provider may recommend that you be screened at an earlier age.  If you have a family history of colorectal cancer, talk with your health care provider about genetic screening.  Your health care provider may also recommend using home test kits to check for hidden blood in your stool.  A small camera at the end of a tube can be used to examine your colon directly (sigmoidoscopy or colonoscopy). This is done to check for the earliest forms of colorectal cancer.  Direct examination of the colon  should be repeated every 5-10 years until age 75. However, if early forms of precancerous polyps or small growths are found or if you have a family history or genetic risk for colorectal cancer, you may need to be screened more often.  Skin Cancer  Check your skin from head to toe regularly.  Monitor any moles. Be sure to tell your health care provider: ? About any new moles or changes in moles, especially if there is a change in a mole's shape or color. ? If you have a mole that is larger than the size of a pencil eraser.  If any of your family members has a history of skin cancer, especially at a young age, talk with your health care provider about genetic screening.  Always use sunscreen. Apply sunscreen liberally and repeatedly throughout the day.  Whenever you are outside, protect yourself by wearing long sleeves, pants, a wide-brimmed hat, and sunglasses.  What should I know about osteoporosis? Osteoporosis is a condition in which bone destruction happens more quickly than new bone creation. After menopause, you may be at an increased risk for osteoporosis. To help prevent osteoporosis or the bone fractures that can happen because of osteoporosis, the following is recommended:  If you are 19-50 years old, get at least 1,000 mg of calcium and at least 600 mg of vitamin D per day.  If you are older than age 50 but younger than age 70, get at least 1,200 mg of calcium and at least 600 mg of vitamin D per day.  If you are older than age 70, get at least 1,200 mg of calcium and at least 800 mg of vitamin D per day.  Smoking and excessive alcohol intake increase the risk of osteoporosis. Eat foods that are rich in calcium and vitamin D, and do weight-bearing exercises several times each week as directed by your health care provider. What should I know about how menopause affects my mental health? Depression may occur at any age, but it is more common as you become older. Common symptoms of  depression include:  Low or sad mood.  Changes in sleep patterns.  Changes in appetite or eating patterns.  Feeling an overall lack of motivation or enjoyment of activities that you previously enjoyed.  Frequent crying spells.  Talk with your health care provider if you think that you are experiencing depression. What should I know about immunizations? It is important that you get and maintain your immunizations. These include:  Tetanus, diphtheria, and pertussis (Tdap) booster vaccine.  Influenza every year before the flu season begins.  Pneumonia vaccine.  Shingles vaccine.  Your health care provider may also recommend other immunizations. This information is not intended to replace advice given to you by your health care provider. Make sure you discuss any questions you have with your health care   provider. Document Released: 01/13/2006 Document Revised: 06/10/2016 Document Reviewed: 08/25/2015 Elsevier Interactive Patient Education  2018 [employer].

## 2017-06-12 NOTE — Assessment & Plan Note (Signed)
Labs

## 2017-06-12 NOTE — Assessment & Plan Note (Signed)
Xanax prn  Potential benefits of a long term benzodiazepines  use as well as potential risks  and complications were explained to the patient and were aknowledged. 

## 2017-06-12 NOTE — Assessment & Plan Note (Signed)
Robaxin prn 

## 2017-06-12 NOTE — Assessment & Plan Note (Signed)
On Vit D 

## 2017-06-12 NOTE — Assessment & Plan Note (Signed)
2017 s/p fall - lumbar compr fx (Murphy-Wainer) Prolia discussed

## 2017-06-12 NOTE — Progress Notes (Signed)
Subjective:  Patient ID: Diane Sawyer, female    DOB: Jun 07, 1946  Age: 71 y.o. MRN: [id number]  CC: No chief complaint on file.   HPI Diane Sawyer presents for a well exam F/u LBP, osteopenia w/compr fx, GERD   Outpatient Medications Prior to Visit  Medication Sig Dispense Refill  . acetaminophen (TYLENOL) 500 MG tablet Take 1,000 mg by mouth every 6 (six) hours as needed for mild pain.    [name] ALPRAZolam (XANAX) 0.25 MG tablet Take 1 tablet (0.25 mg total) by mouth 2 (two) times daily as needed for anxiety. 60 tablet 3  . amLODipine (NORVASC) 5 MG tablet Take 2.5 mg by mouth daily.    . Cholecalciferol 1000 UNITS capsule Take 1,000 Units by mouth daily.      . clobetasol cream (TEMOVATE) 1.02 % Apply 1 application topically daily as needed (dermatosis). Reported on 05/23/2016    . etodolac (LODINE) 500 MG tablet Take 500 mg by mouth daily.    . fexofenadine (ALLEGRA) 180 MG tablet Take 180 mg by mouth daily.      . IRON PO Take 65 mg by mouth daily.     . methocarbamol (ROBAXIN) 500 MG tablet Take 500 mg by mouth every 8 (eight) hours as needed for muscle spasms.    . pantoprazole (PROTONIX) 40 MG tablet Take 1 tab twice daily for 1 month then go to once daily. (Patient taking differently: Take 40 mg by mouth daily. ) 60 tablet 1  . sucralfate (CARAFATE) 1 GM/10ML suspension Take 10 mLs (1 g total) by mouth 4 (four) times daily -  with meals and at bedtime. (Patient not taking: Reported on 06/12/2017) 420 mL 0  . traMADol (ULTRAM) 50 MG tablet Take 1 tablet every 6-8 hours as needed for pain (Patient not taking: Reported on 06/12/2017) 40 tablet 0   No facility-administered medications prior to visit.     ROS Review of Systems  Constitutional: Negative for activity change, appetite change, chills, fatigue and unexpected weight change.  HENT: Negative for congestion, mouth sores and sinus pressure.   Eyes: Negative for visual disturbance.  Respiratory: Negative for cough and chest  tightness.   Gastrointestinal: Negative for abdominal pain and nausea.  Genitourinary: Negative for difficulty urinating, frequency and vaginal pain.  Musculoskeletal: Negative for back pain and gait problem.  Skin: Negative for pallor and rash.  Neurological: Negative for dizziness, tremors, weakness, numbness and headaches.  Psychiatric/Behavioral: Negative for confusion and sleep disturbance.    Objective:  BP 134/86 (BP Location: Left Arm, Patient Position: Sitting, Cuff Size: Large)   Pulse 81   Temp 98.1 F (36.7 C) (Oral)   Ht 5\' 3"  (1.6 m)   Wt 193 lb (87.5 kg)   SpO2 98%   BMI 34.19 kg/m   BP Readings from Last 3 Encounters:  06/12/17 134/86  11/21/16 130/88  10/10/16 130/70    Wt Readings from Last 3 Encounters:  06/12/17 193 lb (87.5 kg)  11/21/16 192 lb (87.1 kg)  10/10/16 195 lb (88.5 kg)    Physical Exam  Constitutional: She appears well-developed. No distress.  HENT:  Head: Normocephalic.  Right Ear: External ear normal.  Left Ear: External ear normal.  Nose: Nose normal.  Mouth/Throat: Oropharynx is clear and moist.  Eyes: Conjunctivae are normal. Pupils are equal, round, and reactive to light. Right eye exhibits no discharge. Left eye exhibits no discharge.  Neck: Normal range of motion. Neck supple. No JVD present. No tracheal deviation  present. No thyromegaly present.  Cardiovascular: Normal rate, regular rhythm and normal heart sounds.   Pulmonary/Chest: No stridor. No respiratory distress. She has no wheezes.  Abdominal: Soft. Bowel sounds are normal. She exhibits no distension and no mass. There is no tenderness. There is no rebound and no guarding.  Musculoskeletal: She exhibits no edema or tenderness.  Lymphadenopathy:    She has no cervical adenopathy.  Neurological: She displays normal reflexes. No cranial nerve deficit. She exhibits normal muscle tone. Coordination normal.  Skin: No rash noted. No erythema.  Psychiatric: She has a normal  mood and affect. Her behavior is normal. Judgment and thought content normal.  obese LS sensitive w/ROM  Lab Results  Component Value Date   WBC 5.8 10/26/2016   HGB 13.6 10/26/2016   HCT 41.3 10/26/2016   PLT 239.0 10/26/2016   GLUCOSE 113 (H) 10/01/2016   CHOL 170 11/09/2015   TRIG 97.0 11/09/2015   HDL 52.80 11/09/2015   LDLCALC 98 11/09/2015   ALT 45 10/01/2016   AST 81 (H) 10/01/2016   NA 141 10/01/2016   K 3.7 10/01/2016   CL 109 10/01/2016   CREATININE 0.78 10/01/2016   BUN 15 10/01/2016   CO2 24 10/01/2016   TSH 0.87 11/09/2015    Dg Abd Acute W/chest  Result Date: 10/01/2016 CLINICAL DATA:  71 year old female with abdominal pain. EXAM: DG ABDOMEN ACUTE W/ 1V CHEST COMPARISON:  CT dated 10/31/2007 FINDINGS: The lungs are clear. There is no pleural effusion or pneumothorax. The cardiac silhouette is within normal limits. Retrocardiac density corresponds to the hiatal hernia seen on the prior CT. There is osteopenia. No acute osseous pathology. There is no bowel dilatation or evidence of obstruction. No free air or radiopaque calculi. The osseous structures and soft tissues appear unremarkable. IMPRESSION: Negative abdominal radiographs.  No acute cardiopulmonary disease. Electronically Signed   By: Anner Crete M.D.   On: 10/01/2016 06:31   US Abdomen Limited Ruq  Result Date: 10/01/2016 CLINICAL DATA:  Abdominal pain this morning. EXAM: US ABDOMEN LIMITED - RIGHT UPPER QUADRANT COMPARISON:  10/31/2007 CT abdomen/pelvis. FINDINGS: Gallbladder: Mildly distended gallbladder. No gallbladder wall thickening. Mild-to-moderate layering sludge. No gallstones. No pericholecystic fluid or sonographic Murphy's sign. Common bile duct: Diameter: 5 mm Liver: Simple lobulated 2.3 x 2.1 x 2.2 cm posterior left liver lobe cyst. No additional liver lesions. Background liver parenchymal echogenicity and echotexture are within normal limits. IMPRESSION: 1. Gallbladder sludge. No  cholelithiasis. No evidence of acute cholecystitis. 2. No biliary ductal dilatation. 3. Benign left liver lobe cyst.  Otherwise normal liver. Electronically Signed   By: Ilona Sorrel M.D.   On: 10/01/2016 07:44    Assessment & Plan:   There are no diagnoses linked to this encounter. I have discontinued Ms. Blasdell's sucralfate and traMADol. I am also having her maintain her IRON PO, Cholecalciferol, fexofenadine, clobetasol cream, methocarbamol, acetaminophen, amLODipine, etodolac, pantoprazole, and ALPRAZolam.  No orders of the defined types were placed in this encounter.    Follow-up: No Follow-up on file.  Walker Kehr, MD

## 2017-06-12 NOTE — Assessment & Plan Note (Signed)

## 2017-06-13 LAB — URINALYSIS
Bilirubin Urine: NEGATIVE
Hgb urine dipstick: NEGATIVE
KETONES UR: NEGATIVE
LEUKOCYTES UA: NEGATIVE
NITRITE: NEGATIVE
SPECIFIC GRAVITY, URINE: 1.01 (ref 1.000–1.030)
Total Protein, Urine: NEGATIVE
UROBILINOGEN UA: 0.2 (ref 0.0–1.0)
Urine Glucose: NEGATIVE
pH: 6.5 (ref 5.0–8.0)

## 2017-06-14 ENCOUNTER — Telehealth: Payer: Self-pay | Admitting: Internal Medicine

## 2017-06-14 DIAGNOSIS — E2839 Other primary ovarian failure: Secondary | ICD-10-CM

## 2017-06-14 NOTE — Telephone Encounter (Signed)
Pt would like orders put in for a DEXA scan

## 2017-06-15 NOTE — Telephone Encounter (Signed)
dexa entered

## 2017-07-03 ENCOUNTER — Telehealth: Payer: Self-pay

## 2017-07-03 NOTE — Telephone Encounter (Signed)
Routing to brittany---can you schedule patient for bone density----needs to be anytime on any Monday---Monday is her day off from work---I can call her back to advise of appt time if needed---thanks

## 2017-07-04 NOTE — Telephone Encounter (Signed)
Patient has an appointment on Monday Aug 6 @930 . Patient is aware.

## 2017-07-04 NOTE — Telephone Encounter (Signed)
Diane Sawyer will check bone density results and talk further with patient if we need to proceed with prolia

## 2017-07-10 ENCOUNTER — Inpatient Hospital Stay: Admission: RE | Admit: 2017-07-10 | Payer: Medicare Other | Source: Ambulatory Visit

## 2017-07-20 NOTE — Telephone Encounter (Signed)
Routing to dr [employer], can you please review bone density results and advise if patient needs to start prolia---thanks

## 2017-07-21 NOTE — Telephone Encounter (Signed)
Bone density has been rescheduled for 07/31/17

## 2017-07-21 NOTE — Telephone Encounter (Signed)
I don't see the report Thx

## 2017-07-25 DIAGNOSIS — H401111 Primary open-angle glaucoma, right eye, mild stage: Secondary | ICD-10-CM | POA: Diagnosis not present

## 2017-07-28 ENCOUNTER — Other Ambulatory Visit: Payer: Self-pay | Admitting: Internal Medicine

## 2017-07-31 ENCOUNTER — Ambulatory Visit (INDEPENDENT_AMBULATORY_CARE_PROVIDER_SITE_OTHER)
Admission: RE | Admit: 2017-07-31 | Discharge: 2017-07-31 | Disposition: A | Discharge status: Home / Self Care | Payer: Medicare Other | Source: Ambulatory Visit | Attending: Internal Medicine | Admitting: Internal Medicine

## 2017-07-31 DIAGNOSIS — E2839 Other primary ovarian failure: Secondary | ICD-10-CM

## 2017-09-04 DIAGNOSIS — H401111 Primary open-angle glaucoma, right eye, mild stage: Secondary | ICD-10-CM | POA: Diagnosis not present

## 2017-10-17 ENCOUNTER — Ambulatory Visit (INDEPENDENT_AMBULATORY_CARE_PROVIDER_SITE_OTHER): Payer: Medicare Other | Admitting: *Deleted

## 2017-10-17 DIAGNOSIS — Z23 Encounter for immunization: Secondary | ICD-10-CM | POA: Diagnosis not present

## 2017-10-25 ENCOUNTER — Other Ambulatory Visit: Payer: Self-pay | Admitting: Internal Medicine

## 2017-12-11 ENCOUNTER — Other Ambulatory Visit (INDEPENDENT_AMBULATORY_CARE_PROVIDER_SITE_OTHER): Payer: Medicare Other

## 2017-12-11 ENCOUNTER — Ambulatory Visit (INDEPENDENT_AMBULATORY_CARE_PROVIDER_SITE_OTHER): Payer: Medicare Other | Admitting: Internal Medicine

## 2017-12-11 ENCOUNTER — Encounter: Payer: Self-pay | Admitting: Internal Medicine

## 2017-12-11 DIAGNOSIS — I10 Essential (primary) hypertension: Secondary | ICD-10-CM

## 2017-12-11 DIAGNOSIS — F419 Anxiety disorder, unspecified: Secondary | ICD-10-CM

## 2017-12-11 DIAGNOSIS — E559 Vitamin D deficiency, unspecified: Secondary | ICD-10-CM

## 2017-12-11 DIAGNOSIS — M8580 Other specified disorders of bone density and structure, unspecified site: Secondary | ICD-10-CM

## 2017-12-11 LAB — BASIC METABOLIC PANEL
BUN: 13 mg/dL (ref 6–23)
CHLORIDE: 107 meq/L (ref 96–112)
CO2: 30 meq/L (ref 19–32)
CREATININE: 0.69 mg/dL (ref 0.40–1.20)
Calcium: 9.1 mg/dL (ref 8.4–10.5)
GFR: 89.29 mL/min (ref >60.00)
Glucose, Bld: 94 mg/dL (ref 70–99)
Potassium: 4.1 mEq/L (ref 3.5–5.1)
Sodium: 144 mEq/L (ref 135–145)

## 2017-12-11 MED ORDER — CHOLECALCIFEROL 25 MCG (1000 UT) PO CAPS: 2000.0000 [IU] | ORAL_CAPSULE | Freq: Every day | ORAL | 11 refills | Status: DC

## 2017-12-11 MED ORDER — ALPRAZOLAM 0.25 MG PO TABS: 0.2500 mg | ORAL_TABLET | Freq: Two times a day (BID) | ORAL | 3 refills | Status: DC | PRN

## 2017-12-11 NOTE — Assessment & Plan Note (Signed)
Prolia recomended

## 2017-12-11 NOTE — Assessment & Plan Note (Signed)
Labs

## 2017-12-11 NOTE — Progress Notes (Signed)
Subjective:  Patient ID: Diane Sawyer, female    DOB: [birth year]  Age: 72 y.o. MRN: [id number]  CC: No chief complaint on file.   HPI Diane Sawyer presents for GERD, anxiety, HTN f/u  Outpatient Medications Prior to Visit  Medication Sig Dispense Refill  . acetaminophen (TYLENOL) 500 MG tablet Take 1,000 mg by mouth every 6 (six) hours as needed for mild pain.    [name] ALPRAZolam (XANAX) 0.25 MG tablet Take 1 tablet (0.25 mg total) by mouth 2 (two) times daily as needed for anxiety. 60 tablet 3  . amLODipine (NORVASC) 5 MG tablet Take 2.5 mg by mouth daily.    . Cholecalciferol 1000 UNITS capsule Take 1,000 Units by mouth daily.      . clobetasol cream (TEMOVATE) 2.44 % Apply 1 application topically daily as needed (dermatosis). Reported on 05/23/2016    . etodolac (LODINE) 500 MG tablet Take 500 mg by mouth daily.    . fexofenadine (ALLEGRA) 180 MG tablet Take 180 mg by mouth daily.      . IRON PO Take 65 mg by mouth daily.     . methocarbamol (ROBAXIN) 500 MG tablet Take 1 tablet (500 mg total) by mouth every 8 (eight) hours as needed for muscle spasms. 60 tablet 1  . pantoprazole (PROTONIX) 40 MG tablet TAKE 1 TABLET EVERY DAY 90 tablet 2  . amLODipine (NORVASC) 5 MG tablet Take 1 tablet (5 mg total) by mouth daily. TAKE 1 TABLET EVERY DAY 90 tablet 1  . pantoprazole (PROTONIX) 40 MG tablet Take 1 tab twice daily for 1 month then go to once daily. (Patient taking differently: Take 40 mg by mouth daily. ) 60 tablet 1   No facility-administered medications prior to visit.     ROS Review of Systems  Constitutional: Negative for activity change, appetite change, chills, fatigue and unexpected weight change.  HENT: Negative for congestion, mouth sores and sinus pressure.   Eyes: Negative for visual disturbance.  Respiratory: Negative for cough and chest tightness.   Gastrointestinal: Negative for abdominal pain and nausea.  Genitourinary: Negative for difficulty urinating, frequency and  vaginal pain.  Musculoskeletal: Negative for back pain and gait problem.  Skin: Negative for pallor and rash.  Neurological: Negative for dizziness, tremors, weakness, numbness and headaches.  Psychiatric/Behavioral: Negative for confusion and sleep disturbance. The patient is nervous/anxious.     Objective:  BP 130/84 (BP Location: Left Arm, Patient Position: Sitting, Cuff Size: Large)   Pulse 78   Temp 98.3 F (36.8 C) (Oral)   Ht 5\' 3"  (1.6 m)   Wt 202 lb (91.6 kg)   SpO2 98%   BMI 35.78 kg/m   BP Readings from Last 3 Encounters:  12/11/17 130/84  06/12/17 134/86  11/21/16 130/88    Wt Readings from Last 3 Encounters:  12/11/17 202 lb (91.6 kg)  06/12/17 193 lb (87.5 kg)  11/21/16 192 lb (87.1 kg)    Physical Exam  Constitutional: She appears well-developed. No distress.  HENT:  Head: Normocephalic.  Right Ear: External ear normal.  Left Ear: External ear normal.  Nose: Nose normal.  Mouth/Throat: Oropharynx is clear and moist.  Eyes: Conjunctivae are normal. Pupils are equal, round, and reactive to light. Right eye exhibits no discharge. Left eye exhibits no discharge.  Neck: Normal range of motion. Neck supple. No JVD present. No tracheal deviation present. No thyromegaly present.  Cardiovascular: Normal rate, regular rhythm and normal heart sounds.  Pulmonary/Chest: No stridor. No  respiratory distress. She has no wheezes.  Abdominal: Soft. Bowel sounds are normal. She exhibits no distension and no mass. There is no tenderness. There is no rebound and no guarding.  Musculoskeletal: She exhibits no edema or tenderness.  Lymphadenopathy:    She has no cervical adenopathy.  Neurological: She displays normal reflexes. No cranial nerve deficit. She exhibits normal muscle tone. Coordination normal.  Skin: No rash noted. No erythema.  Psychiatric: She has a normal mood and affect. Her behavior is normal. Judgment and thought content normal.    Lab Results  Component  Value Date   WBC 6.3 06/12/2017   HGB 14.0 06/12/2017   HCT 41.9 06/12/2017   PLT 238.0 06/12/2017   GLUCOSE 90 06/12/2017   CHOL 186 06/12/2017   TRIG 118.0 06/12/2017   HDL 66.20 06/12/2017   LDLCALC 97 06/12/2017   ALT 9 06/12/2017   AST 13 06/12/2017   NA 139 06/12/2017   K 4.4 06/12/2017   CL 104 06/12/2017   CREATININE 0.75 06/12/2017   BUN 15 06/12/2017   CO2 28 06/12/2017   TSH 0.75 06/12/2017    Dg Bone Density  Result Date: 08/03/2017 Date of study: 07/31/2017 Exam: DUAL X-RAY ABSORPTIOMETRY (DXA) FOR BONE MINERAL DENSITY (BMD) Instrument: [employer] Provider: PCP Indication: follow up for low BMD Comparison: none (please note that it is not possible to compare data from different instruments) Clinical data: Pt is a 72 y.o. female with previous history of vertebral fracture. On vitamin D. Results:  Lumbar spine L1-L4 Femoral neck (FN) T-score -1.7 RFN: -1.8 LFN: -2.1 Assessment: the BMD is low according to the [facility] classification for osteoporosis (see below). Fracture risk: moderate FRAX score: 10 year major osteoporotic risk: 18.1%. 10 year hip fracture risk: 3.5%. The thresholds for treatment are 20% and 3%, respectively. Comments: the technical quality of the study is good. Evaluation for secondary causes should be considered if clinically indicated. Recommend optimizing calcium (1200 mg/day) and vitamin D (800 IU/day) intake. Followup: Repeat BMD is appropriate after 2 years or after 1-2 years if starting treatment. WHO criteria for diagnosis of osteoporosis in postmenopausal women and in men 22 y/o or older: - normal: T-score -1.0 to + 1.0 - osteopenia/low bone density: T-score between -2.5 and -1.0 - osteoporosis: T-score below -2.5 - severe osteoporosis: T-score below -2.5 with history of fragility fracture Note: although not part of the WHO classification, the presence of a fragility fracture, regardless of the T-score, should be considered diagnostic of  osteoporosis, provided other causes for the fracture have been excluded. Treatment: The National Osteoporosis Foundation recommends that treatment be considered in postmenopausal women and men age 64 or older with: 1. Hip or vertebral (clinical or morphometric) fracture 2. T-score of - 2.5 or lower at the spine or hip 3. 10-year fracture probability by FRAX of at least 20% for a major osteoporotic fracture and 3% for a hip fracture Philemon Kingdom, MD [facility]    Assessment & Plan:   There are no diagnoses linked to this encounter. I am having Shanigua A. Medico maintain her IRON PO, Cholecalciferol, fexofenadine, clobetasol cream, acetaminophen, amLODipine, etodolac, ALPRAZolam, methocarbamol, and pantoprazole.  No orders of the defined types were placed in this encounter.    Follow-up: No Follow-up on file.  Walker Kehr, MD

## 2017-12-11 NOTE — Patient Instructions (Signed)
[facility] well w/Jill 

## 2017-12-11 NOTE — Assessment & Plan Note (Signed)
On Vit D 

## 2017-12-11 NOTE — Assessment & Plan Note (Signed)
Xanax prn  Potential benefits of a long term benzodiazepines  use as well as potential risks  and complications were explained to the patient and were aknowledged. 

## 2017-12-25 ENCOUNTER — Encounter (INDEPENDENT_AMBULATORY_CARE_PROVIDER_SITE_OTHER): Payer: Self-pay | Admitting: Family Medicine

## 2017-12-25 ENCOUNTER — Ambulatory Visit (INDEPENDENT_AMBULATORY_CARE_PROVIDER_SITE_OTHER): Payer: Medicare Other | Admitting: Family Medicine

## 2017-12-25 VITALS — BP 158/93 | HR 68 | Temp 98.1°F | Ht 63.0 in | Wt 196.0 lb

## 2017-12-25 DIAGNOSIS — R5383 Other fatigue: Secondary | ICD-10-CM | POA: Diagnosis not present

## 2017-12-25 DIAGNOSIS — R739 Hyperglycemia, unspecified: Secondary | ICD-10-CM | POA: Insufficient documentation

## 2017-12-25 DIAGNOSIS — Z1331 Encounter for screening for depression: Secondary | ICD-10-CM

## 2017-12-25 DIAGNOSIS — Z6832 Body mass index (BMI) 32.0-32.9, adult: Secondary | ICD-10-CM

## 2017-12-25 DIAGNOSIS — R0602 Shortness of breath: Secondary | ICD-10-CM

## 2017-12-25 DIAGNOSIS — E559 Vitamin D deficiency, unspecified: Secondary | ICD-10-CM

## 2017-12-25 DIAGNOSIS — E669 Obesity, unspecified: Secondary | ICD-10-CM | POA: Diagnosis not present

## 2017-12-25 DIAGNOSIS — I10 Essential (primary) hypertension: Secondary | ICD-10-CM

## 2017-12-25 DIAGNOSIS — Z0289 Encounter for other administrative examinations: Secondary | ICD-10-CM

## 2017-12-25 DIAGNOSIS — Z6834 Body mass index (BMI) 34.0-34.9, adult: Secondary | ICD-10-CM | POA: Diagnosis not present

## 2017-12-25 NOTE — Progress Notes (Signed)
.  Office: [phone]  /  Fax: [phone]   HPI:   Chief Complaint: OBESITY  Diane Sawyer (MR# [id number]) is a 72 y.o. female who presents on 12/25/2017 for obesity evaluation and treatment. Current BMI is Body mass index is 34.72 kg/m.[name] Diane Sawyer has struggled with obesity for years and has been unsuccessful in either losing weight or maintaining long term weight loss. She has been following her husband's meal plan for the past few days and has been having nausea and gas since starting to eat similar to he husband. She is worried about her husband's eating habits and how it affects her. Versa attended our information session and states she is currently in the action stage of change and ready to dedicate time achieving and maintaining a healthier weight.  Diane Sawyer states her family eats meals together she thinks her family will eat healthier with her she started gaining weight after childbirth and menopause her heaviest weight ever was 201 lbs. she skips meals frequently she is frequently drinking liquids with calories she struggles with emotional eating    Fatigue Diane Sawyer feels her energy is lower than it should be. This has worsened with weight gain and has not worsened recently. Her recent EKG was within normal limits. She denies chest pain, syncope or recent weight gain or weight loss. Diane Sawyer admits to daytime somnolence and admits to waking up still tired. Patient is at risk for obstructive sleep apnea. Patent has a history of symptoms of daytime fatigue, morning fatigue and hypertension. Patient generally gets 6 hours of sleep per night, and states they generally have restful sleep. Snoring is present. Apneic episodes are not present. Epworth Sleepiness Score is 9  Dyspnea on exertion Diane Sawyer notes increasing shortness of breath with exercising and seems to be worsening over time with weight gain. She notes getting out of breath sooner with activity than she used to. This has not gotten worse  recently. Ogechi denies orthopnea.  Vitamin Diane deficiency with history of compression fracture Diane Sawyer has a diagnosis of vitamin Diane deficiency. She is currently taking vit Diane 3 OTC 1,000 IU daily and denies nausea, vomiting or muscle weakness but admits increased fatigue. She has recent prescription of prolia. Her last lab was at 60 in July 2018.   Ref. Range 06/12/2017 11:11  VITD Latest Ref Range: 30.00 - 100.00 ng/mL 38.30   Hyperglycemia  Inesha has a diagnosis of hyperglycemia with a history of hyperglycemia. She admits increased polyphagia.  Hypertension Diane Sawyer is a 72 y.o. female with hypertension. Her blood pressure is slightly elevated today at 158/93. She is on 1/2 tablet of amlodipine daily. Ethyl Vila Genrich denies chest pain, palpitations or headache . She is working weight loss to help control her blood pressure with the goal of decreasing her risk of heart attack and stroke. Diane Sawyer blood pressure is not currently controlled.  Depression Screen Diane Sawyer's Food and Mood (modified PHQ-9) score was  Depression screen PHQ 2/9 12/25/2017  Decreased Interest 0  Down, Depressed, Hopeless 1  PHQ - 2 Score 1  Altered sleeping 0  Tired, decreased energy 3  Change in appetite 1  Feeling bad or failure about yourself  0  Trouble concentrating 0  Moving slowly or fidgety/restless 0  Suicidal thoughts 0  PHQ-9 Score 5  Difficult doing work/chores Not difficult at all    ALLERGIES: Allergies  Allergen Reactions  . Benazepril Hcl     REACTION: achy- makes her feel strange.  . Levofloxacin Nausea Only  .  Mobic [Meloxicam] Other (See Comments)    Felt like chest going to explode    MEDICATIONS: Current Outpatient Medications on File Prior to Visit  Medication Sig Dispense Refill  . acetaminophen (TYLENOL) 500 MG tablet Take 1,000 mg by mouth every 6 (six) hours as needed for mild pain.    [name] ALPRAZolam (XANAX) 0.25 MG tablet Take 1 tablet (0.25 mg total) by mouth 2 (two) times daily as  needed for anxiety. 60 tablet 3  . amLODipine (NORVASC) 5 MG tablet Take 2.5 mg by mouth daily.    . Cholecalciferol 1000 units capsule Take 2 capsules (2,000 Units total) by mouth daily. 100 capsule 11  . clobetasol cream (TEMOVATE) 2.02 % Apply 1 application topically daily as needed (dermatosis). Reported on 05/23/2016    . etodolac (LODINE) 500 MG tablet Take 500 mg by mouth daily.    . fexofenadine (ALLEGRA) 180 MG tablet Take 180 mg by mouth daily.      . hydroxypropyl methylcellulose / hypromellose (ISOPTO TEARS / GONIOVISC) 2.5 % ophthalmic solution Place 1 drop into both eyes as needed for dry eyes.    . IRON PO Take 65 mg by mouth daily.     . methocarbamol (ROBAXIN) 500 MG tablet Take 1 tablet (500 mg total) by mouth every 8 (eight) hours as needed for muscle spasms. 60 tablet 1  . pantoprazole (PROTONIX) 40 MG tablet TAKE 1 TABLET EVERY DAY 90 tablet 2  . timolol (BETIMOL) 0.25 % ophthalmic solution Place 1 drop into both eyes 2 (two) times daily.     No current facility-administered medications on file prior to visit.     PAST MEDICAL HISTORY: Past Medical History:  Diagnosis Date  . Allergic rhinitis   . Anemia   . Anxiety   . Back pain   . Diverticulosis   . GERD (gastroesophageal reflux disease)   . Glaucoma   . Hypertension   . Lumbar compression fracture ([place]) 09/07/2016   x 2   . Menopause   . Vitamin Diane deficiency     PAST SURGICAL HISTORY: Past Surgical History:  Procedure Laterality Date  . CATARACT EXTRACTION, BILATERAL  04/06/2017   with cypass stent implanted  . NO PAST SURGERIES      SOCIAL HISTORY: Social History   Tobacco Use  . Smoking status: Never Smoker  . Smokeless tobacco: Never Used  Substance Use Topics  . Alcohol use: No  . Drug use: No    FAMILY HISTORY: Family History  Problem Relation Age of Onset  . Stroke Mother 2  . Hypertension Mother   . Sudden death Mother   . Anxiety disorder Mother   . Lung cancer Father 32  .  Colon cancer Neg Hx     ROS: Review of Systems  Constitutional: Positive for malaise/fatigue.  Respiratory: Positive for shortness of breath (on exertion).   Cardiovascular: Negative for chest pain, palpitations and orthopnea.  Gastrointestinal: Positive for diarrhea and heartburn. Negative for nausea and vomiting.  Musculoskeletal: Positive for back pain.       Negative muscle weakness   Skin: Positive for itching and rash.  Neurological: Negative for headaches.  Endo/Heme/Allergies:       Positive polyphagia    PHYSICAL EXAM: Blood pressure (!) 158/93, pulse 68, temperature 98.1 F (36.7 C), temperature source Oral, height 5\' 3"  (1.6 m), weight 196 lb (88.9 kg), SpO2 97 %. Body mass index is 34.72 kg/m. Physical Exam  Constitutional: She is oriented to person, place, and  time. She appears well-developed and well-nourished.  HENT:  Head: Normocephalic and atraumatic.  Nose: Nose normal.  Eyes: EOM are normal. No scleral icterus.  Positive lens replacement left eye  Neck: Normal range of motion. Neck supple. No thyromegaly present.  Cardiovascular: Normal rate and regular rhythm.  Pulmonary/Chest: Effort normal. No respiratory distress.  Abdominal: Soft. There is no tenderness.  + obesity  Musculoskeletal: Normal range of motion.  Range of Motion normal in all 4 extremities  Neurological: She is alert and oriented to person, place, and time. Coordination normal.  Skin: Skin is warm and dry.  Psychiatric: She has a normal mood and affect. Her behavior is normal.  Vitals reviewed.   RECENT LABS AND TESTS: BMET    Component Value Date/Time   NA 144 12/11/2017 0857   K 4.1 12/11/2017 0857   CL 107 12/11/2017 0857   CO2 30 12/11/2017 0857   GLUCOSE 94 12/11/2017 0857   BUN 13 12/11/2017 0857   CREATININE 0.69 12/11/2017 0857   CALCIUM 9.1 12/11/2017 0857   GFRNONAA >60 10/01/2016 0525   GFRAA >60 10/01/2016 0525   No results found for: HGBA1C No results found  for: INSULIN CBC    Component Value Date/Time   WBC 6.3 06/12/2017 1111   RBC 4.63 06/12/2017 1111   HGB 14.0 06/12/2017 1111   HCT 41.9 06/12/2017 1111   PLT 238.0 06/12/2017 1111   MCV 90.4 06/12/2017 1111   MCV 95.0 09/18/2014 2108   MCH 29.6 10/01/2016 0525   MCHC 33.5 06/12/2017 1111   RDW 13.5 06/12/2017 1111   LYMPHSABS 1.8 06/12/2017 1111   MONOABS 0.5 06/12/2017 1111   EOSABS 0.2 06/12/2017 1111   BASOSABS 0.1 06/12/2017 1111   Iron/TIBC/Ferritin/ %Sat    Component Value Date/Time   IRON 82 05/23/2016 1005   IRONPCTSAT 24.9 05/23/2016 1005   Lipid Panel     Component Value Date/Time   CHOL 186 06/12/2017 1111   TRIG 118.0 06/12/2017 1111   HDL 66.20 06/12/2017 1111   CHOLHDL 3 06/12/2017 1111   VLDL 23.6 06/12/2017 1111   LDLCALC 97 06/12/2017 1111   Hepatic Function Panel     Component Value Date/Time   PROT 7.2 06/12/2017 1111   ALBUMIN 4.3 06/12/2017 1111   AST 13 06/12/2017 1111   ALT 9 06/12/2017 1111   ALKPHOS 67 06/12/2017 1111   BILITOT 0.3 06/12/2017 1111   BILIDIR 0.0 06/12/2017 1111      Component Value Date/Time   TSH 0.75 06/12/2017 1111   Vitamin Diane   Ref. Range 06/12/2017 11:11  VITD Latest Ref Range: 30.00 - 100.00 ng/mL 38.30    ECG  shows NSR with a rate of 72 BPM INDIRECT CALORIMETER done today shows a VO2 of 194 and a REE of 1353. Her calculated basal metabolic rate is 4742 thus her basal metabolic rate is worse than expected.    ASSESSMENT AND PLAN: Other fatigue - Plan: EKG 12-Lead, CBC With Differential, T3, T4, free, TSH  Shortness of breath on exertion  Vitamin Diane deficiency - Plan: VITAMIN Diane 25 Hydroxy (Vit-Diane Deficiency, Fractures)  Hyperglycemia - Plan: Comprehensive metabolic panel, Hemoglobin A1c, Insulin, random  Essential hypertension - Plan: Lipid Panel With LDL/HDL Ratio  Depression screening  Class 1 obesity with serious comorbidity and body mass index (BMI) of 34.0 to 34.9 in adult, unspecified obesity  type  PLAN:  Fatigue Diane Sawyer was informed that her fatigue may be related to obesity, depression or many other causes. We  will order Echocardiogram and indirect calorimetry. Labs will be ordered, and in the meanwhile Hertha has agreed to work on diet, exercise and weight loss to help with fatigue. Proper sleep hygiene was discussed including the need for 7-8 hours of quality sleep each night. A sleep study was not ordered based on symptoms and Epworth score.  Dyspnea on exertion Diane Sawyer's shortness of breath appears to be obesity related and exercise induced. She has agreed to work on weight loss and gradually increase exercise to treat her exercise induced shortness of breath. If Laray follows our instructions and loses weight without improvement of her shortness of breath, we will plan to refer to pulmonology. We will monitor this condition regularly. Diane Sawyer agrees to this plan.  Vitamin Diane Deficiency with history of compression fracture  Diane Sawyer was informed that low vitamin Diane levels contributes to fatigue and are associated with obesity, breast, and colon cancer. She will continue to take OTC vitamin D3 @1 ,000 IU daily. We will check labs today and will discuss at follow up visit. We will follow up for routine testing of vitamin Diane, at least 2-3 times per year. She was informed of the risk of over-replacement of vitamin Diane and agrees to not increase her dose unless she discusses this with [place] first.  Hyperglycemia  We will order CMP, Hgb A1c and insulin labs today. Diane Sawyer agrees to follow up with our clinic in 2 weeks.  Hypertension We discussed sodium restriction, working on healthy weight loss, and a regular exercise program as the means to achieve improved blood pressure control. Diane Sawyer agreed with this plan and agreed to follow up as directed. We will order labs and continue to monitor her blood pressure closely as well as her progress with the above lifestyle modifications. She will continue her  medications as prescribed and will watch for signs of hypotension as she continues her lifestyle modifications.  Depression Screen Diane Sawyer had a mildly positive depression screening. Depression is commonly associated with obesity and often results in emotional eating behaviors. We will monitor this closely and work on CBT to help improve the non-hunger eating patterns. Referral to Psychology may be required if no improvement is seen as she continues in our clinic.  Obesity Diane Sawyer is currently in the action stage of change and her goal is to continue with weight loss efforts She has agreed to follow the Category 2 plan Diane Sawyer has been instructed to work up to a goal of 150 minutes of combined cardio and strengthening exercise per week for weight loss and overall health benefits. We discussed the following Behavioral Modification Strategies today: increase H2O intake, increasing lean protein intake, decreasing simple carbohydrates , increasing vegetables, dealing with family or coworker sabotage and decrease liquid calories  Diane Sawyer has agreed to follow up with our clinic in 2 weeks. She was informed of the importance of frequent follow up visits to maximize her success with intensive lifestyle modifications for her multiple health conditions. She was informed we would discuss her lab results at her next visit unless there is a critical issue that needs to be addressed sooner. Diane Sawyer agreed to keep her next visit at the agreed upon time to discuss these results.    OBESITY BEHAVIORAL INTERVENTION VISIT  Today's visit was # 1 out of 22.  Starting weight: 196 lbs Starting date: 12/25/17 Today's weight : 196 lbs Today's date: 12/25/2017 Total lbs lost to date: 0 (Patients must lose 7 lbs in the first 6 months to continue with counseling)  ASK: We discussed the diagnosis of obesity with Diane Sawyer today and Diane Sawyer agreed to give [place] permission to discuss obesity behavioral modification therapy  today.  ASSESS: Diane Sawyer has the diagnosis of obesity and her BMI today is 34.73 Diane Sawyer is in the action stage of change   ADVISE: Diane Sawyer was educated on the multiple health risks of obesity as well as the benefit of weight loss to improve her health. She was advised of the need for long term treatment and the importance of lifestyle modifications.  AGREE: Multiple dietary modification options and treatment options were discussed and  Recie agreed to the above obesity treatment plan.   I, Doreene Nest, am acting as transcriptionist for Dennard Nip, MD  I have reviewed the above documentation for accuracy and completeness, and I agree with the above. -Dennard Nip, MD

## 2017-12-26 LAB — LIPID PANEL WITH LDL/HDL RATIO
CHOLESTEROL TOTAL: 191 mg/dL (ref 100–199)
HDL: 61 mg/dL (ref >39)
LDL CALC: 107 mg/dL — AB (ref 0–99)
LDl/HDL Ratio: 1.8 ratio (ref 0.0–3.2)
Triglycerides: 114 mg/dL (ref 0–149)
VLDL CHOLESTEROL CAL: 23 mg/dL (ref 5–40)

## 2017-12-26 LAB — CBC WITH DIFFERENTIAL
BASOS: 1 %
Basophils Absolute: 0 10*3/uL (ref 0.0–0.2)
EOS (ABSOLUTE): 0.1 10*3/uL (ref 0.0–0.4)
EOS: 2 %
HEMATOCRIT: 41.5 % (ref 34.0–46.6)
HEMOGLOBIN: 14 g/dL (ref 11.1–15.9)
Immature Grans (Abs): 0 10*3/uL (ref 0.0–0.1)
Immature Granulocytes: 0 %
LYMPHS: 23 %
Lymphocytes Absolute: 1.4 10*3/uL (ref 0.7–3.1)
MCH: 29.7 pg (ref 26.6–33.0)
MCHC: 33.7 g/dL (ref 31.5–35.7)
MCV: 88 fL (ref 79–97)
MONOCYTES: 6 %
Monocytes Absolute: 0.4 10*3/uL (ref 0.1–0.9)
NEUTROS ABS: 4.3 10*3/uL (ref 1.4–7.0)
NEUTROS PCT: 68 %
RBC: 4.72 x10E6/uL (ref 3.77–5.28)
RDW: 13.7 % (ref 12.3–15.4)
WBC: 6.2 10*3/uL (ref 3.4–10.8)

## 2017-12-26 LAB — COMPREHENSIVE METABOLIC PANEL
A/G RATIO: 1.8 (ref 1.2–2.2)
ALBUMIN: 4.5 g/dL (ref 3.5–4.8)
ALT: 9 IU/L (ref 0–32)
AST: 15 IU/L (ref 0–40)
Alkaline Phosphatase: 73 IU/L (ref 39–117)
BILIRUBIN TOTAL: 0.3 mg/dL (ref 0.0–1.2)
BUN / CREAT RATIO: 17 (ref 12–28)
BUN: 12 mg/dL (ref 8–27)
CALCIUM: 9.2 mg/dL (ref 8.7–10.3)
CHLORIDE: 102 mmol/L (ref 96–106)
CO2: 25 mmol/L (ref 20–29)
Creatinine, Ser: 0.72 mg/dL (ref 0.57–1.00)
GFR, EST AFRICAN AMERICAN: 98 mL/min/{1.73_m2} (ref >59)
GFR, EST NON AFRICAN AMERICAN: 85 mL/min/{1.73_m2} (ref >59)
Globulin, Total: 2.5 g/dL (ref 1.5–4.5)
Glucose: 98 mg/dL (ref 65–99)
POTASSIUM: 4.1 mmol/L (ref 3.5–5.2)
Sodium: 142 mmol/L (ref 134–144)
TOTAL PROTEIN: 7 g/dL (ref 6.0–8.5)

## 2017-12-26 LAB — TSH: TSH: 1.27 u[IU]/mL (ref 0.450–4.500)

## 2017-12-26 LAB — INSULIN, RANDOM: INSULIN: 9 u[IU]/mL (ref 2.6–24.9)

## 2017-12-26 LAB — T3: T3 TOTAL: 104 ng/dL (ref 71–180)

## 2017-12-26 LAB — HEMOGLOBIN A1C
Est. average glucose Bld gHb Est-mCnc: 120 mg/dL
Hgb A1c MFr Bld: 5.8 % — ABNORMAL HIGH (ref 4.8–5.6)

## 2017-12-26 LAB — T4, FREE: Free T4: 1.41 ng/dL (ref 0.82–1.77)

## 2017-12-26 LAB — VITAMIN D 25 HYDROXY (VIT D DEFICIENCY, FRACTURES): VIT D 25 HYDROXY: 29.2 ng/mL — AB (ref 30.0–100.0)

## 2018-01-01 ENCOUNTER — Ambulatory Visit (INDEPENDENT_AMBULATORY_CARE_PROVIDER_SITE_OTHER): Payer: Medicare Other

## 2018-01-01 DIAGNOSIS — M8580 Other specified disorders of bone density and structure, unspecified site: Secondary | ICD-10-CM | POA: Diagnosis not present

## 2018-01-01 MED ORDER — DENOSUMAB 60 MG/ML ~~LOC~~ SOLN
60.0000 mg | Freq: Once | SUBCUTANEOUS | Status: AC
  Administered 2018-01-01: 60 mg via SUBCUTANEOUS

## 2018-01-08 ENCOUNTER — Ambulatory Visit (INDEPENDENT_AMBULATORY_CARE_PROVIDER_SITE_OTHER): Payer: Medicare Other | Admitting: Family Medicine

## 2018-01-08 VITALS — BP 124/86 | HR 84 | Temp 98.3°F | Ht 63.0 in | Wt 193.0 lb

## 2018-01-08 DIAGNOSIS — R7303 Prediabetes: Secondary | ICD-10-CM

## 2018-01-08 DIAGNOSIS — E669 Obesity, unspecified: Secondary | ICD-10-CM | POA: Diagnosis not present

## 2018-01-08 DIAGNOSIS — Z6834 Body mass index (BMI) 34.0-34.9, adult: Secondary | ICD-10-CM

## 2018-01-08 DIAGNOSIS — E559 Vitamin D deficiency, unspecified: Secondary | ICD-10-CM | POA: Diagnosis not present

## 2018-01-08 NOTE — Progress Notes (Signed)
Office: [phone]  /  Fax: [phone]   HPI:   Chief Complaint: OBESITY Diane Sawyer is here to discuss her progress with her obesity treatment plan. She is on the Category 2 plan and is following her eating plan approximately 100 % of the time. She states she is exercising 0 minutes 0 times per week. Diane Sawyer has done well with weight loss, her hunger was controlled but she struggled to eat all of her protein especially for dinner.  Her weight is 193 lb (87.5 kg) today and has had a weight loss of 3 pounds over a period of 2 weeks since her last visit. She has lost 3 lbs since starting treatment with [place].  Vitamin D deficiency Diane Sawyer has a diagnosis of vitamin D deficiency. She is on OTC Vit D 2,000 daily and felt it caused diarrhea so she decreased back to 1,000 daily. She denies nausea, vomiting or muscle weakness.  Pre-Diabetes Diane Sawyer has a diagnosis of pre-diabetes based on her elevated Hgb A1c at 5.8, with normal fasting glucose and was informed this puts her at greater risk of developing diabetes. She is not taking metformin currently and continues to work on diet and exercise to decrease risk of diabetes. She notes polyphagia and denies nausea or hypoglycemia.  ALLERGIES: Allergies  Allergen Reactions  . Benazepril Hcl     REACTION: achy- makes her feel strange.  . Levofloxacin Nausea Only  . Mobic [Meloxicam] Other (See Comments)    Felt like chest going to explode    MEDICATIONS: Current Outpatient Medications on File Prior to Visit  Medication Sig Dispense Refill  . acetaminophen (TYLENOL) 500 MG tablet Take 1,000 mg by mouth every 6 (six) hours as needed for mild pain.    [name] ALPRAZolam (XANAX) 0.25 MG tablet Take 1 tablet (0.25 mg total) by mouth 2 (two) times daily as needed for anxiety. 60 tablet 3  . amLODipine (NORVASC) 5 MG tablet Take 2.5 mg by mouth daily.    . Cholecalciferol 1000 units capsule Take 2 capsules (2,000 Units total) by mouth daily. 100 capsule 11  .  clobetasol cream (TEMOVATE) 3.78 % Apply 1 application topically daily as needed (dermatosis). Reported on 05/23/2016    . etodolac (LODINE) 500 MG tablet Take 500 mg by mouth daily.    . fexofenadine (ALLEGRA) 180 MG tablet Take 180 mg by mouth daily.      . hydroxypropyl methylcellulose / hypromellose (ISOPTO TEARS / GONIOVISC) 2.5 % ophthalmic solution Place 1 drop into both eyes as needed for dry eyes.    . IRON PO Take 65 mg by mouth daily.     . methocarbamol (ROBAXIN) 500 MG tablet Take 1 tablet (500 mg total) by mouth every 8 (eight) hours as needed for muscle spasms. 60 tablet 1  . pantoprazole (PROTONIX) 40 MG tablet TAKE 1 TABLET EVERY DAY 90 tablet 2  . timolol (BETIMOL) 0.25 % ophthalmic solution Place 1 drop into both eyes 2 (two) times daily.     No current facility-administered medications on file prior to visit.     PAST MEDICAL HISTORY: Past Medical History:  Diagnosis Date  . Allergic rhinitis   . Anemia   . Anxiety   . Back pain   . Diverticulosis   . GERD (gastroesophageal reflux disease)   . Glaucoma   . Hypertension   . Lumbar compression fracture ([place]) 09/07/2016   x 2   . Menopause   . Vitamin D deficiency     PAST SURGICAL  HISTORY: Past Surgical History:  Procedure Laterality Date  . CATARACT EXTRACTION, BILATERAL  04/06/2017   with cypass stent implanted  . NO PAST SURGERIES      SOCIAL HISTORY: Social History   Tobacco Use  . Smoking status: Never Smoker  . Smokeless tobacco: Never Used  Substance Use Topics  . Alcohol use: No  . Drug use: No    FAMILY HISTORY: Family History  Problem Relation Age of Onset  . Stroke Mother 21  . Hypertension Mother   . Sudden death Mother   . Anxiety disorder Mother   . Lung cancer Father 40  . Colon cancer Neg Hx     ROS: Review of Systems  Constitutional: Positive for weight loss.  Gastrointestinal: Positive for diarrhea. Negative for nausea and vomiting.  Musculoskeletal:       Negative  muscle weakness  Endo/Heme/Allergies:       Positive polyphagia Negative hypoglycemia    PHYSICAL EXAM: Blood pressure 124/86, pulse 84, temperature 98.3 F (36.8 C), temperature source Oral, height 5\' 3"  (1.6 m), weight 193 lb (87.5 kg), SpO2 98 %. Body mass index is 34.19 kg/m. Physical Exam  Constitutional: She is oriented to person, place, and time. She appears well-developed and well-nourished.  Cardiovascular: Normal rate.  Pulmonary/Chest: Effort normal.  Musculoskeletal: Normal range of motion.  Neurological: She is oriented to person, place, and time.  Skin: Skin is warm and dry.  Psychiatric: She has a normal mood and affect. Her behavior is normal.  Vitals reviewed.   RECENT LABS AND TESTS: BMET    Component Value Date/Time   NA 142 12/25/2017 1021   K 4.1 12/25/2017 1021   CL 102 12/25/2017 1021   CO2 25 12/25/2017 1021   GLUCOSE 98 12/25/2017 1021   GLUCOSE 94 12/11/2017 0857   BUN 12 12/25/2017 1021   CREATININE 0.72 12/25/2017 1021   CALCIUM 9.2 12/25/2017 1021   GFRNONAA 85 12/25/2017 1021   GFRAA 98 12/25/2017 1021   Lab Results  Component Value Date   HGBA1C 5.8 (H) 12/25/2017   Lab Results  Component Value Date   INSULIN 9.0 12/25/2017   CBC    Component Value Date/Time   WBC 6.2 12/25/2017 1021   WBC 6.3 06/12/2017 1111   RBC 4.72 12/25/2017 1021   RBC 4.63 06/12/2017 1111   HGB 14.0 12/25/2017 1021   HCT 41.5 12/25/2017 1021   PLT 238.0 06/12/2017 1111   MCV 88 12/25/2017 1021   MCH 29.7 12/25/2017 1021   MCH 29.6 10/01/2016 0525   MCHC 33.7 12/25/2017 1021   MCHC 33.5 06/12/2017 1111   RDW 13.7 12/25/2017 1021   LYMPHSABS 1.4 12/25/2017 1021   MONOABS 0.5 06/12/2017 1111   EOSABS 0.1 12/25/2017 1021   BASOSABS 0.0 12/25/2017 1021   Iron/TIBC/Ferritin/ %Sat    Component Value Date/Time   IRON 82 05/23/2016 1005   IRONPCTSAT 24.9 05/23/2016 1005   Lipid Panel     Component Value Date/Time   CHOL 191 12/25/2017 1021    TRIG 114 12/25/2017 1021   HDL 61 12/25/2017 1021   CHOLHDL 3 06/12/2017 1111   VLDL 23.6 06/12/2017 1111   LDLCALC 107 (H) 12/25/2017 1021   Hepatic Function Panel     Component Value Date/Time   PROT 7.0 12/25/2017 1021   ALBUMIN 4.5 12/25/2017 1021   AST 15 12/25/2017 1021   ALT 9 12/25/2017 1021   ALKPHOS 73 12/25/2017 1021   BILITOT 0.3 12/25/2017 1021   BILIDIR  0.0 06/12/2017 1111      Component Value Date/Time   TSH 1.270 12/25/2017 1021   TSH 0.75 06/12/2017 1111   TSH 0.87 11/09/2015 0940  Results for TRACEE, MCCREERY (MRN [id number]) as of 01/08/2018 09:56  Ref. Range 12/25/2017 10:21  Vitamin D, 25-Hydroxy Latest Ref Range: 30.0 - 100.0 ng/mL 29.2 (L)    ASSESSMENT AND PLAN: Vitamin D deficiency  Prediabetes  Class 1 obesity with serious comorbidity and body mass index (BMI) of 34.0 to 34.9 in adult, unspecified obesity type  PLAN:  Vitamin D Deficiency Diane Sawyer was informed that low vitamin D levels contributes to fatigue and are associated with obesity, breast, and colon cancer. Diane Sawyer agrees to continue taking OTC Vit D 1,000 IU daily for now, will try to slowly increase after her diarrhea resolves. She will follow up for routine testing of vitamin D, at least 2-3 times per year. She was informed of the risk of over-replacement of vitamin D and agrees to not increase her dose unless she discusses this with [place] first. Diane Sawyer agrees to follow up with our clinic in 2 weeks.  Pre-Diabetes Diane Sawyer will continue to work on diet, weight loss, exercise, and decreasing simple carbohydrates in her diet to help decrease the risk of diabetes. We dicussed metformin including benefits and risks. She was informed that eating too many simple carbohydrates or too many calories at one sitting increases the likelihood of GI side effects. Diane Sawyer declined metformin for now due to having chronic diarrhea and a prescription was not written today. Diane Sawyer agrees to follow up with our clinic in 2 weeks as  directed to monitor her progress.  We spent > than 50% of the 30 minute visit on the counseling as documented in the note.  Obesity Diane Sawyer is currently in the action stage of change. As such, her goal is to continue with weight loss efforts She has agreed to follow the Category 2 plan Diane Sawyer has been instructed to work up to a goal of 150 minutes of combined cardio and strengthening exercise per week for weight loss and overall health benefits. We discussed the following Behavioral Modification Strategies today: increasing lean protein intake, decreasing simple carbohydrates, and better snacking choices    Diane Sawyer has agreed to follow up with our clinic in 2 weeks. She was informed of the importance of frequent follow up visits to maximize her success with intensive lifestyle modifications for her multiple health conditions.   OBESITY BEHAVIORAL INTERVENTION VISIT  Today's visit was # 2 out of 22.  Starting weight: 196 lbs Starting date: 12/25/17 Today's weight : 193 lbs  Today's date: 01/08/2018 Total lbs lost to date: 3 (Patients must lose 7 lbs in the first 6 months to continue with counseling)   ASK: We discussed the diagnosis of obesity with Diane Sawyer today and Diane Sawyer agreed to give [place] permission to discuss obesity behavioral modification therapy today.  ASSESS: Diane Sawyer has the diagnosis of obesity and her BMI today is 34.2 Diane Sawyer is in the action stage of change   ADVISE: Diane Sawyer was educated on the multiple health risks of obesity as well as the benefit of weight loss to improve her health. She was advised of the need for long term treatment and the importance of lifestyle modifications.  AGREE: Multiple dietary modification options and treatment options were discussed and  Diane Sawyer agreed to the above obesity treatment plan.  Diane Sawyer, am acting as transcriptionist for Dennard Nip, MD  I have reviewed the above  documentation for accuracy and completeness, and I agree  with the above. -Dennard Nip, MD

## 2018-01-15 DIAGNOSIS — H401131 Primary open-angle glaucoma, bilateral, mild stage: Secondary | ICD-10-CM | POA: Diagnosis not present

## 2018-01-22 ENCOUNTER — Ambulatory Visit (INDEPENDENT_AMBULATORY_CARE_PROVIDER_SITE_OTHER): Payer: Medicare Other | Admitting: Family Medicine

## 2018-01-22 VITALS — BP 126/80 | HR 67 | Temp 98.0°F | Ht 63.0 in | Wt 190.0 lb

## 2018-01-22 DIAGNOSIS — Z6833 Body mass index (BMI) 33.0-33.9, adult: Secondary | ICD-10-CM

## 2018-01-22 DIAGNOSIS — E669 Obesity, unspecified: Secondary | ICD-10-CM

## 2018-01-22 DIAGNOSIS — I10 Essential (primary) hypertension: Secondary | ICD-10-CM | POA: Diagnosis not present

## 2018-01-23 NOTE — Progress Notes (Signed)
Office: [phone]  /  Fax: [phone]   HPI:   Chief Complaint: OBESITY Diane Sawyer is here to discuss her progress with her obesity treatment plan. She is on the Category 2 plan and is following her eating plan approximately 100 % of the time. She states she is walking at work. Diane Sawyer continues to do well with weighty loss on the category 2 plan. She is getting good support from friends and has increased eating out, but is trying to make better choices. Her weight is 190 lb (86.2 kg) today and has had a weight loss of 3 pounds over a period of 2 weeks since her last visit. She has lost 6 lbs since starting treatment with [place].  Hypertension Diane Sawyer is a 72 y.o. female with hypertension. She is doing well with diet and weight loss. She is not feeling lightheaded or dizzy. She is tolerating norvasc well. Diane Sawyer denies chest pain or shortness of breath on exertion. She is working weight loss to help control her blood pressure with the goal of decreasing her risk of heart attack and stroke. Diane Sawyer blood pressure is well controlled.  ALLERGIES: Allergies  Allergen Reactions  . Benazepril Hcl     REACTION: achy- makes her feel strange.  . Levofloxacin Nausea Only  . Mobic [Meloxicam] Other (See Comments)    Felt like chest going to explode    MEDICATIONS: Current Outpatient Medications on File Prior to Visit  Medication Sig Dispense Refill  . acetaminophen (TYLENOL) 500 MG tablet Take 1,000 mg by mouth every 6 (six) hours as needed for mild pain.    [name] ALPRAZolam (XANAX) 0.25 MG tablet Take 1 tablet (0.25 mg total) by mouth 2 (two) times daily as needed for anxiety. 60 tablet 3  . amLODipine (NORVASC) 5 MG tablet Take 2.5 mg by mouth daily.    . Cholecalciferol 1000 units capsule Take 2 capsules (2,000 Units total) by mouth daily. 100 capsule 11  . clobetasol cream (TEMOVATE) 6.27 % Apply 1 application topically daily as needed (dermatosis). Reported on 05/23/2016    . etodolac  (LODINE) 500 MG tablet Take 500 mg by mouth daily.    . fexofenadine (ALLEGRA) 180 MG tablet Take 180 mg by mouth daily.      . hydroxypropyl methylcellulose / hypromellose (ISOPTO TEARS / GONIOVISC) 2.5 % ophthalmic solution Place 1 drop into both eyes as needed for dry eyes.    . IRON PO Take 65 mg by mouth daily.     . methocarbamol (ROBAXIN) 500 MG tablet Take 1 tablet (500 mg total) by mouth every 8 (eight) hours as needed for muscle spasms. 60 tablet 1  . pantoprazole (PROTONIX) 40 MG tablet TAKE 1 TABLET EVERY DAY 90 tablet 2  . timolol (BETIMOL) 0.25 % ophthalmic solution Place 1 drop into both eyes 2 (two) times daily.     No current facility-administered medications on file prior to visit.     PAST MEDICAL HISTORY: Past Medical History:  Diagnosis Date  . Allergic rhinitis   . Anemia   . Anxiety   . Back pain   . Diverticulosis   . GERD (gastroesophageal reflux disease)   . Glaucoma   . Hypertension   . Lumbar compression fracture ([place]) 09/07/2016   x 2   . Menopause   . Vitamin D deficiency     PAST SURGICAL HISTORY: Past Surgical History:  Procedure Laterality Date  . CATARACT EXTRACTION, BILATERAL  04/06/2017   with cypass stent  implanted  . NO PAST SURGERIES      SOCIAL HISTORY: Social History   Tobacco Use  . Smoking status: Never Smoker  . Smokeless tobacco: Never Used  Substance Use Topics  . Alcohol use: No  . Drug use: No    FAMILY HISTORY: Family History  Problem Relation Age of Onset  . Stroke Mother 74  . Hypertension Mother   . Sudden death Mother   . Anxiety disorder Mother   . Lung cancer Father 23  . Colon cancer Neg Hx     ROS: Review of Systems  Constitutional: Positive for weight loss.  Respiratory: Negative for shortness of breath (on exertion).   Cardiovascular: Negative for chest pain.  Neurological: Negative for dizziness.       Negative for lightheadedness    PHYSICAL EXAM: Blood pressure 126/80, pulse 67,  temperature 98 F (36.7 C), temperature source Oral, height 5\' 3"  (1.6 m), weight 190 lb (86.2 kg), SpO2 97 %. Body mass index is 33.66 kg/m. Physical Exam  Constitutional: She is oriented to person, place, and time. She appears well-developed and well-nourished.  Cardiovascular: Normal rate.  Pulmonary/Chest: Effort normal.  Musculoskeletal: Normal range of motion.  Neurological: She is oriented to person, place, and time.  Skin: Skin is warm and dry.  Psychiatric: She has a normal mood and affect. Her behavior is normal.  Vitals reviewed.   RECENT LABS AND TESTS: BMET    Component Value Date/Time   NA 142 12/25/2017 1021   K 4.1 12/25/2017 1021   CL 102 12/25/2017 1021   CO2 25 12/25/2017 1021   GLUCOSE 98 12/25/2017 1021   GLUCOSE 94 12/11/2017 0857   BUN 12 12/25/2017 1021   CREATININE 0.72 12/25/2017 1021   CALCIUM 9.2 12/25/2017 1021   GFRNONAA 85 12/25/2017 1021   GFRAA 98 12/25/2017 1021   Lab Results  Component Value Date   HGBA1C 5.8 (H) 12/25/2017   Lab Results  Component Value Date   INSULIN 9.0 12/25/2017   CBC    Component Value Date/Time   WBC 6.2 12/25/2017 1021   WBC 6.3 06/12/2017 1111   RBC 4.72 12/25/2017 1021   RBC 4.63 06/12/2017 1111   HGB 14.0 12/25/2017 1021   HCT 41.5 12/25/2017 1021   PLT 238.0 06/12/2017 1111   MCV 88 12/25/2017 1021   MCH 29.7 12/25/2017 1021   MCH 29.6 10/01/2016 0525   MCHC 33.7 12/25/2017 1021   MCHC 33.5 06/12/2017 1111   RDW 13.7 12/25/2017 1021   LYMPHSABS 1.4 12/25/2017 1021   MONOABS 0.5 06/12/2017 1111   EOSABS 0.1 12/25/2017 1021   BASOSABS 0.0 12/25/2017 1021   Iron/TIBC/Ferritin/ %Sat    Component Value Date/Time   IRON 82 05/23/2016 1005   IRONPCTSAT 24.9 05/23/2016 1005   Lipid Panel     Component Value Date/Time   CHOL 191 12/25/2017 1021   TRIG 114 12/25/2017 1021   HDL 61 12/25/2017 1021   CHOLHDL 3 06/12/2017 1111   VLDL 23.6 06/12/2017 1111   LDLCALC 107 (H) 12/25/2017 1021    Hepatic Function Panel     Component Value Date/Time   PROT 7.0 12/25/2017 1021   ALBUMIN 4.5 12/25/2017 1021   AST 15 12/25/2017 1021   ALT 9 12/25/2017 1021   ALKPHOS 73 12/25/2017 1021   BILITOT 0.3 12/25/2017 1021   BILIDIR 0.0 06/12/2017 1111      Component Value Date/Time   TSH 1.270 12/25/2017 1021   TSH 0.75 06/12/2017 1111  TSH 0.87 11/09/2015 0940    ASSESSMENT AND PLAN: Essential hypertension  Class 1 obesity with serious comorbidity and body mass index (BMI) of 33.0 to 33.9 in adult, unspecified obesity type  Hypertension We discussed sodium restriction, working on healthy weight loss, and a regular exercise program as the means to achieve improved blood pressure control. Diane Sawyer agrees to continue norvasc, diet and exercise with the goal to be able to diet control without medications. Diane Sawyer agreed to follow up as directed. We will continue to monitor her blood pressure as well as her progress with the above lifestyle modifications. She will watch for signs of hypotension as she continues her lifestyle modifications.  We spent > than 50% of the 15 minute visit on the counseling as documented in the note.  Obesity Diane Sawyer is currently in the action stage of change. As such, her goal is to continue with weight loss efforts She has agreed to follow the Category 2 plan Diane Sawyer has been instructed to work up to a goal of 150 minutes of combined cardio and strengthening exercise per week for weight loss and overall health benefits. We discussed the following Behavioral Modification Strategies today: decrease eating out, no skipping meals and planning for success  Diane Sawyer has agreed to follow up with our clinic in 2 to 3 weeks. She was informed of the importance of frequent follow up visits to maximize her success with intensive lifestyle modifications for her multiple health conditions.   OBESITY BEHAVIORAL INTERVENTION VISIT  Today's visit was # 3 out of 22.  Starting  weight: 196 lbs Starting date: 12/25/17 Today's weight : 190 lbs Today's date: 01/22/2018 Total lbs lost to date: 6 (Patients must lose 7 lbs in the first 6 months to continue with counseling)   ASK: We discussed the diagnosis of obesity with Diane Sawyer today and Diane Sawyer agreed to give [place] permission to discuss obesity behavioral modification therapy today.  ASSESS: Diane Sawyer has the diagnosis of obesity and her BMI today is 33.67 Diane Sawyer is in the action stage of change   ADVISE: Diane Sawyer was educated on the multiple health risks of obesity as well as the benefit of weight loss to improve her health. She was advised of the need for long term treatment and the importance of lifestyle modifications.  AGREE: Multiple dietary modification options and treatment options were discussed and  Diane Sawyer agreed to the above obesity treatment plan.  I, Doreene Nest, am acting as transcriptionist for Dennard Nip, MD  I have reviewed the above documentation for accuracy and completeness, and I agree with the above. -Dennard Nip, MD

## 2018-02-01 ENCOUNTER — Other Ambulatory Visit: Payer: Self-pay | Admitting: Internal Medicine

## 2018-02-05 DIAGNOSIS — Z961 Presence of intraocular lens: Secondary | ICD-10-CM | POA: Diagnosis not present

## 2018-02-05 DIAGNOSIS — H25813 Combined forms of age-related cataract, bilateral: Secondary | ICD-10-CM | POA: Diagnosis not present

## 2018-02-05 DIAGNOSIS — H401111 Primary open-angle glaucoma, right eye, mild stage: Secondary | ICD-10-CM | POA: Diagnosis not present

## 2018-02-05 DIAGNOSIS — H401122 Primary open-angle glaucoma, left eye, moderate stage: Secondary | ICD-10-CM | POA: Diagnosis not present

## 2018-02-07 ENCOUNTER — Ambulatory Visit (INDEPENDENT_AMBULATORY_CARE_PROVIDER_SITE_OTHER): Payer: Medicare Other | Admitting: Physician Assistant

## 2018-02-07 VITALS — BP 134/81 | HR 75 | Temp 98.0°F | Ht 63.0 in | Wt 188.0 lb

## 2018-02-07 DIAGNOSIS — E669 Obesity, unspecified: Secondary | ICD-10-CM

## 2018-02-07 DIAGNOSIS — Z6833 Body mass index (BMI) 33.0-33.9, adult: Secondary | ICD-10-CM

## 2018-02-07 DIAGNOSIS — R7303 Prediabetes: Secondary | ICD-10-CM

## 2018-02-07 NOTE — Progress Notes (Signed)
Office: [phone]  /  Fax: [phone]   HPI:   Chief Complaint: OBESITY Diane Sawyer is here to discuss her progress with her obesity treatment plan. She is on the Category 2 plan and is following her eating plan approximately 100 % of the time. She states she is exercising 0 minutes 0 times per week. Diane Sawyer continues to do well with weight loss, despite some increased social eating. Diane Sawyer manages to be mindful of her eating and she controls her portions. She has been skipping some of her breakfast meals. Her weight is 188 lb (85.3 kg) today and has had a weight loss of 2 pounds over a period of 2 weeks since her last visit. She has lost 8 lbs since starting treatment with Diane Sawyer.  Pre-Diabetes Diane Sawyer has a diagnosis of pre-diabetes based on her elevated Hgb A1c and was informed this puts her at greater risk of developing diabetes. She is not taking metformin currently and continues to work on diet and exercise to decrease risk of diabetes. She denies nausea, polyphagia or hypoglycemia.  ALLERGIES: Allergies  Allergen Reactions  . Benazepril Hcl     REACTION: achy- makes her feel strange.  . Levofloxacin Nausea Only  . Mobic [Meloxicam] Other (See Comments)    Felt like chest going to explode    MEDICATIONS: Current Outpatient Medications on File Prior to Visit  Medication Sig Dispense Refill  . acetaminophen (TYLENOL) 500 MG tablet Take 1,000 mg by mouth every 6 (six) hours as needed for mild pain.    [name] ALPRAZolam (XANAX) 0.25 MG tablet Take 1 tablet (0.25 mg total) by mouth 2 (two) times daily as needed for anxiety. 60 tablet 3  . amLODipine (NORVASC) 5 MG tablet TAKE 1 TABLET BY MOUTH EVERY DAY 90 tablet 1  . Cholecalciferol 1000 units capsule Take 2 capsules (2,000 Units total) by mouth daily. 100 capsule 11  . clobetasol cream (TEMOVATE) 5.45 % Apply 1 application topically daily as needed (dermatosis). Reported on 05/23/2016    . etodolac (LODINE) 500 MG tablet Take 500 mg by mouth  daily.    . fexofenadine (ALLEGRA) 180 MG tablet Take 180 mg by mouth daily.      . hydroxypropyl methylcellulose / hypromellose (ISOPTO TEARS / GONIOVISC) 2.5 % ophthalmic solution Place 1 drop into both eyes as needed for dry eyes.    . IRON PO Take 65 mg by mouth daily.     . methocarbamol (ROBAXIN) 500 MG tablet Take 1 tablet (500 mg total) by mouth every 8 (eight) hours as needed for muscle spasms. 60 tablet 1  . pantoprazole (PROTONIX) 40 MG tablet TAKE 1 TABLET BY MOUTH EVERY DAY 90 tablet 1  . timolol (BETIMOL) 0.25 % ophthalmic solution Place 1 drop into the right eye daily.      No current facility-administered medications on file prior to visit.     PAST MEDICAL HISTORY: Past Medical History:  Diagnosis Date  . Allergic rhinitis   . Anemia   . Anxiety   . Back pain   . Diverticulosis   . GERD (gastroesophageal reflux disease)   . Glaucoma   . Hypertension   . Lumbar compression fracture ([place]) 09/07/2016   x 2   . Menopause   . Vitamin D deficiency     PAST SURGICAL HISTORY: Past Surgical History:  Procedure Laterality Date  . CATARACT EXTRACTION, BILATERAL  04/06/2017   with cypass stent implanted  . NO PAST SURGERIES      SOCIAL HISTORY:  Social History   Tobacco Use  . Smoking status: Never Smoker  . Smokeless tobacco: Never Used  Substance Use Topics  . Alcohol use: No  . Drug use: No    FAMILY HISTORY: Family History  Problem Relation Age of Onset  . Stroke Mother 28  . Hypertension Mother   . Sudden death Mother   . Anxiety disorder Mother   . Lung cancer Father 57  . Colon cancer Neg Hx     ROS: Review of Systems  Constitutional: Positive for weight loss.  Gastrointestinal: Negative for nausea.  Endo/Heme/Allergies:       Negative for polyphagia Negative for hypoglycemia    PHYSICAL EXAM: Blood pressure 134/81, pulse 75, temperature 98 F (36.7 C), temperature source Oral, height 5\' 3"  (1.6 m), weight 188 lb (85.3 kg), SpO2 99  %. Body mass index is 33.3 kg/m. Physical Exam  Constitutional: She is oriented to person, place, and time. She appears well-developed and well-nourished.  Cardiovascular: Normal rate.  Pulmonary/Chest: Effort normal.  Musculoskeletal: Normal range of motion.  Neurological: She is oriented to person, place, and time.  Skin: Skin is warm and dry.  Psychiatric: She has a normal mood and affect. Her behavior is normal.  Vitals reviewed.   RECENT LABS AND TESTS: BMET    Component Value Date/Time   NA 142 12/25/2017 1021   K 4.1 12/25/2017 1021   CL 102 12/25/2017 1021   CO2 25 12/25/2017 1021   GLUCOSE 98 12/25/2017 1021   GLUCOSE 94 12/11/2017 0857   BUN 12 12/25/2017 1021   CREATININE 0.72 12/25/2017 1021   CALCIUM 9.2 12/25/2017 1021   GFRNONAA 85 12/25/2017 1021   GFRAA 98 12/25/2017 1021   Lab Results  Component Value Date   HGBA1C 5.8 (H) 12/25/2017   Lab Results  Component Value Date   INSULIN 9.0 12/25/2017   CBC    Component Value Date/Time   WBC 6.2 12/25/2017 1021   WBC 6.3 06/12/2017 1111   RBC 4.72 12/25/2017 1021   RBC 4.63 06/12/2017 1111   HGB 14.0 12/25/2017 1021   HCT 41.5 12/25/2017 1021   PLT 238.0 06/12/2017 1111   MCV 88 12/25/2017 1021   MCH 29.7 12/25/2017 1021   MCH 29.6 10/01/2016 0525   MCHC 33.7 12/25/2017 1021   MCHC 33.5 06/12/2017 1111   RDW 13.7 12/25/2017 1021   LYMPHSABS 1.4 12/25/2017 1021   MONOABS 0.5 06/12/2017 1111   EOSABS 0.1 12/25/2017 1021   BASOSABS 0.0 12/25/2017 1021   Iron/TIBC/Ferritin/ %Sat    Component Value Date/Time   IRON 82 05/23/2016 1005   IRONPCTSAT 24.9 05/23/2016 1005   Lipid Panel     Component Value Date/Time   CHOL 191 12/25/2017 1021   TRIG 114 12/25/2017 1021   HDL 61 12/25/2017 1021   CHOLHDL 3 06/12/2017 1111   VLDL 23.6 06/12/2017 1111   LDLCALC 107 (H) 12/25/2017 1021   Hepatic Function Panel     Component Value Date/Time   PROT 7.0 12/25/2017 1021   ALBUMIN 4.5 12/25/2017  1021   AST 15 12/25/2017 1021   ALT 9 12/25/2017 1021   ALKPHOS 73 12/25/2017 1021   BILITOT 0.3 12/25/2017 1021   BILIDIR 0.0 06/12/2017 1111      Component Value Date/Time   TSH 1.270 12/25/2017 1021   TSH 0.75 06/12/2017 1111   TSH 0.87 11/09/2015 0940    ASSESSMENT AND PLAN: Prediabetes  Class 1 obesity with serious comorbidity and body mass index (  BMI) of 33.0 to 33.9 in adult, unspecified obesity type  PLAN:  Pre-Diabetes Diane Sawyer will continue to work on weight loss, exercise, and decreasing simple carbohydrates in her diet to help decrease the risk of diabetes. She was informed that eating too many simple carbohydrates or too many calories at one sitting increases the likelihood of GI side effects. Diane Sawyer agreed to follow up with Diane Sawyer as directed to monitor her progress.  We spent > than 50% of the 15 minute visit on the counseling as documented in the note.  Obesity Diane Sawyer is currently in the action stage of change. As such, her goal is to continue with weight loss efforts She has agreed to follow the Category 2 plan Diane Sawyer has been instructed to work up to a goal of 150 minutes of combined cardio and strengthening exercise per week for weight loss and overall health benefits. We discussed the following Behavioral Modification Strategies today: increasing lean protein intake and no skipping meals  Diane Sawyer has agreed to follow up with our clinic in 2 weeks. She was informed of the importance of frequent follow up visits to maximize her success with intensive lifestyle modifications for her multiple health conditions.    OBESITY BEHAVIORAL INTERVENTION VISIT  Today's visit was # 4 out of 22.  Starting weight: 196 lbs Starting date: 12/25/17 Today's weight : 188 lbs Today's date: 02/07/2018 Total lbs lost to date: 8 (Patients must lose 7 lbs in the first 6 months to continue with counseling)   ASK: We discussed the diagnosis of obesity with Diane Sawyer today and Diane Sawyer  agreed to give Diane Sawyer permission to discuss obesity behavioral modification therapy today.  ASSESS: Diane Sawyer has the diagnosis of obesity and her BMI today is 33.31 Diane Sawyer is in the action stage of change   ADVISE: Diane Sawyer was educated on the multiple health risks of obesity as well as the benefit of weight loss to improve her health. She was advised of the need for long term treatment and the importance of lifestyle modifications.  AGREE: Multiple dietary modification options and treatment options were discussed and  Diane Sawyer agreed to the above obesity treatment plan.   Corey Skains, am acting as transcriptionist for [employer], PA-C I, Lacy Duverney [facility], have reviewed this note and agree with its content.

## 2018-02-26 ENCOUNTER — Ambulatory Visit (INDEPENDENT_AMBULATORY_CARE_PROVIDER_SITE_OTHER): Payer: Medicare Other | Admitting: Physician Assistant

## 2018-02-26 VITALS — BP 145/82 | HR 69 | Temp 97.7°F | Ht 63.0 in | Wt 187.0 lb

## 2018-02-26 DIAGNOSIS — I10 Essential (primary) hypertension: Secondary | ICD-10-CM | POA: Diagnosis not present

## 2018-02-26 DIAGNOSIS — Z6833 Body mass index (BMI) 33.0-33.9, adult: Secondary | ICD-10-CM

## 2018-02-26 DIAGNOSIS — E669 Obesity, unspecified: Secondary | ICD-10-CM

## 2018-02-26 NOTE — Progress Notes (Signed)
Office: [phone]  /  Fax: [phone]   HPI:   Chief Complaint: OBESITY Diane Sawyer is here to discuss her progress with her obesity treatment plan. She is on the Category 2 plan and is following her eating plan approximately 95 % of the time. She states she is exercising 0 minutes 0 times per week. [name] managed to make better food choices despite the increased celebration eating. She would like additional breakfast options.  Her weight is 187 lb (84.8 kg) today and has had a weight loss of 1 pound over a period of 3 weeks since her last visit. She has lost 9 lbs since starting treatment with [place].  Hypertension Diane Sawyer is a 72 y.o. female with hypertension. Diane Sawyer's blood pressure is elevated. She is amlodipine 2.5 mg, she states full dose at 5 mg makes her dizzy. She declines any other adjustment of her blood pressure medications today. She denies chest pain or shortness of breath. She is working weight loss to help control her blood pressure with the goal of decreasing her risk of heart attack and stroke. Diane Sawyer's blood pressure is not currently controlled.  ALLERGIES: Allergies  Allergen Reactions  . Benazepril Hcl     REACTION: achy- makes her feel strange.  . Levofloxacin Nausea Only  . Mobic [Meloxicam] Other (See Comments)    Felt like chest going to explode    MEDICATIONS: Current Outpatient Medications on File Prior to Visit  Medication Sig Dispense Refill  . acetaminophen (TYLENOL) 500 MG tablet Take 1,000 mg by mouth every 6 (six) hours as needed for mild pain.    [name] ALPRAZolam (XANAX) 0.25 MG tablet Take 1 tablet (0.25 mg total) by mouth 2 (two) times daily as needed for anxiety. 60 tablet 3  . amLODipine (NORVASC) 5 MG tablet TAKE 1 TABLET BY MOUTH EVERY DAY 90 tablet 1  . Cholecalciferol 1000 units capsule Take 2 capsules (2,000 Units total) by mouth daily. 100 capsule 11  . clobetasol cream (TEMOVATE) 5.99 % Apply 1 application topically daily as needed (dermatosis).  Reported on 05/23/2016    . etodolac (LODINE) 500 MG tablet Take 500 mg by mouth daily.    . fexofenadine (ALLEGRA) 180 MG tablet Take 180 mg by mouth daily.      . hydroxypropyl methylcellulose / hypromellose (ISOPTO TEARS / GONIOVISC) 2.5 % ophthalmic solution Place 1 drop into both eyes as needed for dry eyes.    . IRON PO Take 65 mg by mouth daily.     . methocarbamol (ROBAXIN) 500 MG tablet Take 1 tablet (500 mg total) by mouth every 8 (eight) hours as needed for muscle spasms. 60 tablet 1  . pantoprazole (PROTONIX) 40 MG tablet TAKE 1 TABLET BY MOUTH EVERY DAY 90 tablet 1  . timolol (BETIMOL) 0.25 % ophthalmic solution Place 1 drop into the right eye daily.      No current facility-administered medications on file prior to visit.     PAST MEDICAL HISTORY: Past Medical History:  Diagnosis Date  . Allergic rhinitis   . Anemia   . Anxiety   . Back pain   . Diverticulosis   . GERD (gastroesophageal reflux disease)   . Glaucoma   . Hypertension   . Lumbar compression fracture ([place]) 09/07/2016   x 2   . Menopause   . Vitamin D deficiency     PAST SURGICAL HISTORY: Past Surgical History:  Procedure Laterality Date  . CATARACT EXTRACTION, BILATERAL  04/06/2017   with cypass  stent implanted  . NO PAST SURGERIES      SOCIAL HISTORY: Social History   Tobacco Use  . Smoking status: Never Smoker  . Smokeless tobacco: Never Used  Substance Use Topics  . Alcohol use: No  . Drug use: No    FAMILY HISTORY: Family History  Problem Relation Age of Onset  . Stroke Mother 5  . Hypertension Mother   . Sudden death Mother   . Anxiety disorder Mother   . Lung cancer Father 33  . Colon cancer Neg Hx     ROS: Review of Systems  Constitutional: Positive for weight loss.  Respiratory: Negative for shortness of breath.   Cardiovascular: Negative for chest pain.    PHYSICAL EXAM: Blood pressure (!) 145/82, pulse 69, temperature 97.7 F (36.5 C), temperature source Oral,  height 5\' 3"  (1.6 m), weight 187 lb (84.8 kg), SpO2 99 %. Body mass index is 33.13 kg/m. Physical Exam  Constitutional: She is oriented to person, place, and time. She appears well-developed and well-nourished.  Cardiovascular: Normal rate.  Pulmonary/Chest: Effort normal.  Musculoskeletal: Normal range of motion.  Neurological: She is oriented to person, place, and time.  Skin: Skin is warm and dry.  Psychiatric: She has a normal mood and affect. Her behavior is normal.  Vitals reviewed.   RECENT LABS AND TESTS: BMET    Component Value Date/Time   NA 142 12/25/2017 1021   K 4.1 12/25/2017 1021   CL 102 12/25/2017 1021   CO2 25 12/25/2017 1021   GLUCOSE 98 12/25/2017 1021   GLUCOSE 94 12/11/2017 0857   BUN 12 12/25/2017 1021   CREATININE 0.72 12/25/2017 1021   CALCIUM 9.2 12/25/2017 1021   GFRNONAA 85 12/25/2017 1021   GFRAA 98 12/25/2017 1021   Lab Results  Component Value Date   HGBA1C 5.8 (H) 12/25/2017   Lab Results  Component Value Date   INSULIN 9.0 12/25/2017   CBC    Component Value Date/Time   WBC 6.2 12/25/2017 1021   WBC 6.3 06/12/2017 1111   RBC 4.72 12/25/2017 1021   RBC 4.63 06/12/2017 1111   HGB 14.0 12/25/2017 1021   HCT 41.5 12/25/2017 1021   PLT 238.0 06/12/2017 1111   MCV 88 12/25/2017 1021   MCH 29.7 12/25/2017 1021   MCH 29.6 10/01/2016 0525   MCHC 33.7 12/25/2017 1021   MCHC 33.5 06/12/2017 1111   RDW 13.7 12/25/2017 1021   LYMPHSABS 1.4 12/25/2017 1021   MONOABS 0.5 06/12/2017 1111   EOSABS 0.1 12/25/2017 1021   BASOSABS 0.0 12/25/2017 1021   Iron/TIBC/Ferritin/ %Sat    Component Value Date/Time   IRON 82 05/23/2016 1005   IRONPCTSAT 24.9 05/23/2016 1005   Lipid Panel     Component Value Date/Time   CHOL 191 12/25/2017 1021   TRIG 114 12/25/2017 1021   HDL 61 12/25/2017 1021   CHOLHDL 3 06/12/2017 1111   VLDL 23.6 06/12/2017 1111   LDLCALC 107 (H) 12/25/2017 1021   Hepatic Function Panel     Component Value Date/Time     PROT 7.0 12/25/2017 1021   ALBUMIN 4.5 12/25/2017 1021   AST 15 12/25/2017 1021   ALT 9 12/25/2017 1021   ALKPHOS 73 12/25/2017 1021   BILITOT 0.3 12/25/2017 1021   BILIDIR 0.0 06/12/2017 1111      Component Value Date/Time   TSH 1.270 12/25/2017 1021   TSH 0.75 06/12/2017 1111   TSH 0.87 11/09/2015 0940    ASSESSMENT AND PLAN: Essential  hypertension  Class 1 obesity with serious comorbidity and body mass index (BMI) of 33.0 to 33.9 in adult, unspecified obesity type  PLAN:  Hypertension We discussed sodium restriction, working on healthy weight loss, and a regular exercise program as the means to achieve improved blood pressure control. Diane Sawyer agreed with this plan and agreed to follow up as directed. We will continue to monitor her blood pressure as well as her progress with the above lifestyle modifications. She will continue her medications as prescribed and will watch for signs of hypotension as she continues her lifestyle modifications. Diane Sawyer agrees to follow up with our clinic in 2 weeks.  We spent > than 50% of the 15 minute visit on the counseling as documented in the note.  Obesity Diane Sawyer is currently in the action stage of change. As such, her goal is to continue with weight loss efforts She has agreed to follow the Category 2 plan Diane Sawyer has been instructed to work up to a goal of 150 minutes of combined cardio and strengthening exercise per week for weight loss and overall health benefits. We discussed the following Behavioral Modification Strategies today: increasing lean protein intake and work on meal planning and easy cooking plans   Diane Sawyer has agreed to follow up with our clinic in 2 weeks. She was informed of the importance of frequent follow up visits to maximize her success with intensive lifestyle modifications for her multiple health conditions.   OBESITY BEHAVIORAL INTERVENTION VISIT  Today's visit was # 5 out of 22.  Starting weight: 196 lbs Starting  date: 12/25/17 Today's weight : 187 lbs Today's date: 02/26/2018 Total lbs lost to date: 9 (Patients must lose 7 lbs in the first 6 months to continue with counseling)   ASK: We discussed the diagnosis of obesity with Diane Sawyer today and Diane Sawyer agreed to give [place] permission to discuss obesity behavioral modification therapy today.  ASSESS: Diane Sawyer has the diagnosis of obesity and her BMI today is 33.13 Diane Sawyer is in the action stage of change   ADVISE: Diane Sawyer was educated on the multiple health risks of obesity as well as the benefit of weight loss to improve her health. She was advised of the need for long term treatment and the importance of lifestyle modifications.  AGREE: Multiple dietary modification options and treatment options were discussed and  Diane Sawyer agreed to the above obesity treatment plan.   Diane Sawyer, am acting as transcriptionist for Lacy Duverney, PA-C I, Lacy Duverney [facility], have reviewed this note and agree with its content

## 2018-03-12 ENCOUNTER — Ambulatory Visit (INDEPENDENT_AMBULATORY_CARE_PROVIDER_SITE_OTHER): Payer: Medicare Other | Admitting: Physician Assistant

## 2018-03-12 VITALS — BP 123/80 | HR 69 | Temp 98.0°F | Ht 63.0 in | Wt 183.0 lb

## 2018-03-12 DIAGNOSIS — E669 Obesity, unspecified: Secondary | ICD-10-CM

## 2018-03-12 DIAGNOSIS — I10 Essential (primary) hypertension: Secondary | ICD-10-CM

## 2018-03-12 DIAGNOSIS — Z6832 Body mass index (BMI) 32.0-32.9, adult: Secondary | ICD-10-CM | POA: Diagnosis not present

## 2018-03-12 NOTE — Progress Notes (Signed)
Office: [phone]  /  Fax: [phone]   HPI:   Chief Complaint: OBESITY Diane Sawyer is here to discuss her progress with her obesity treatment plan. She is on the Category 2 plan and is following her eating plan approximately 98 % of the time. She states she is doing yard work on Saturday for exercise. Laurajean continues to do well with weight loss. She has been keeping up with the recommended protein and states her hunger is well controlled. She would like celebration eating strategies. Her weight is 183 lb (83 kg) today and has had a weight loss of 4 pounds over a period of 2 weeks since her last visit. She has lost 13 lbs since starting treatment with [place].  Hypertension Diane Sawyer is a 72 y.o. female with hypertension. Diane Sawyer denies chest pain or shortness of breath on exertion. She is working weight loss to help control her blood pressure with the goal of decreasing her risk of heart attack and stroke. Paulas blood pressure is stable.  ALLERGIES: Allergies  Allergen Reactions  . Benazepril Hcl     REACTION: achy- makes her feel strange.  . Levofloxacin Nausea Only  . Mobic [Meloxicam] Other (See Comments)    Felt like chest going to explode    MEDICATIONS: Current Outpatient Medications on File Prior to Visit  Medication Sig Dispense Refill  . acetaminophen (TYLENOL) 500 MG tablet Take 1,000 mg by mouth every 6 (six) hours as needed for mild pain.    [name] ALPRAZolam (XANAX) 0.25 MG tablet Take 1 tablet (0.25 mg total) by mouth 2 (two) times daily as needed for anxiety. 60 tablet 3  . amLODipine (NORVASC) 5 MG tablet TAKE 1 TABLET BY MOUTH EVERY DAY 90 tablet 1  . Cholecalciferol 1000 units capsule Take 2 capsules (2,000 Units total) by mouth daily. 100 capsule 11  . clobetasol cream (TEMOVATE) 3.32 % Apply 1 application topically daily as needed (dermatosis). Reported on 05/23/2016    . etodolac (LODINE) 500 MG tablet Take 500 mg by mouth daily.    . fexofenadine (ALLEGRA) 180  MG tablet Take 180 mg by mouth daily.      . hydroxypropyl methylcellulose / hypromellose (ISOPTO TEARS / GONIOVISC) 2.5 % ophthalmic solution Place 1 drop into both eyes as needed for dry eyes.    . IRON PO Take 65 mg by mouth daily.     . methocarbamol (ROBAXIN) 500 MG tablet Take 1 tablet (500 mg total) by mouth every 8 (eight) hours as needed for muscle spasms. 60 tablet 1  . pantoprazole (PROTONIX) 40 MG tablet TAKE 1 TABLET BY MOUTH EVERY DAY 90 tablet 1  . timolol (BETIMOL) 0.25 % ophthalmic solution Place 1 drop into the right eye daily.      No current facility-administered medications on file prior to visit.     PAST MEDICAL HISTORY: Past Medical History:  Diagnosis Date  . Allergic rhinitis   . Anemia   . Anxiety   . Back pain   . Diverticulosis   . GERD (gastroesophageal reflux disease)   . Glaucoma   . Hypertension   . Lumbar compression fracture ([place]) 09/07/2016   x 2   . Menopause   . Vitamin D deficiency     PAST SURGICAL HISTORY: Past Surgical History:  Procedure Laterality Date  . CATARACT EXTRACTION, BILATERAL  04/06/2017   with cypass stent implanted  . NO PAST SURGERIES      SOCIAL HISTORY: Social History  Tobacco Use  . Smoking status: Never Smoker  . Smokeless tobacco: Never Used  Substance Use Topics  . Alcohol use: No  . Drug use: No    FAMILY HISTORY: Family History  Problem Relation Age of Onset  . Stroke Mother 89  . Hypertension Mother   . Sudden death Mother   . Anxiety disorder Mother   . Lung cancer Father 106  . Colon cancer Neg Hx     ROS: Review of Systems  Constitutional: Positive for weight loss.  Respiratory: Negative for shortness of breath (on exertion).   Cardiovascular: Negative for chest pain.    PHYSICAL EXAM: Blood pressure 123/80, pulse 69, temperature 98 F (36.7 C), temperature source Oral, height 5\' 3"  (1.6 m), weight 183 lb (83 kg), SpO2 97 %. Body mass index is 32.42 kg/m. Physical Exam    Constitutional: She is oriented to person, place, and time. She appears well-developed and well-nourished.  Cardiovascular: Normal rate.  Pulmonary/Chest: Effort normal.  Musculoskeletal: Normal range of motion.  Neurological: She is oriented to person, place, and time.  Skin: Skin is warm and dry.  Psychiatric: She has a normal mood and affect. Her behavior is normal.  Vitals reviewed.   RECENT LABS AND TESTS: BMET    Component Value Date/Time   NA 142 12/25/2017 1021   K 4.1 12/25/2017 1021   CL 102 12/25/2017 1021   CO2 25 12/25/2017 1021   GLUCOSE 98 12/25/2017 1021   GLUCOSE 94 12/11/2017 0857   BUN 12 12/25/2017 1021   CREATININE 0.72 12/25/2017 1021   CALCIUM 9.2 12/25/2017 1021   GFRNONAA 85 12/25/2017 1021   GFRAA 98 12/25/2017 1021   Lab Results  Component Value Date   HGBA1C 5.8 (H) 12/25/2017   Lab Results  Component Value Date   INSULIN 9.0 12/25/2017   CBC    Component Value Date/Time   WBC 6.2 12/25/2017 1021   WBC 6.3 06/12/2017 1111   RBC 4.72 12/25/2017 1021   RBC 4.63 06/12/2017 1111   HGB 14.0 12/25/2017 1021   HCT 41.5 12/25/2017 1021   PLT 238.0 06/12/2017 1111   MCV 88 12/25/2017 1021   MCH 29.7 12/25/2017 1021   MCH 29.6 10/01/2016 0525   MCHC 33.7 12/25/2017 1021   MCHC 33.5 06/12/2017 1111   RDW 13.7 12/25/2017 1021   LYMPHSABS 1.4 12/25/2017 1021   MONOABS 0.5 06/12/2017 1111   EOSABS 0.1 12/25/2017 1021   BASOSABS 0.0 12/25/2017 1021   Iron/TIBC/Ferritin/ %Sat    Component Value Date/Time   IRON 82 05/23/2016 1005   IRONPCTSAT 24.9 05/23/2016 1005   Lipid Panel     Component Value Date/Time   CHOL 191 12/25/2017 1021   TRIG 114 12/25/2017 1021   HDL 61 12/25/2017 1021   CHOLHDL 3 06/12/2017 1111   VLDL 23.6 06/12/2017 1111   LDLCALC 107 (H) 12/25/2017 1021   Hepatic Function Panel     Component Value Date/Time   PROT 7.0 12/25/2017 1021   ALBUMIN 4.5 12/25/2017 1021   AST 15 12/25/2017 1021   ALT 9 12/25/2017  1021   ALKPHOS 73 12/25/2017 1021   BILITOT 0.3 12/25/2017 1021   BILIDIR 0.0 06/12/2017 1111      Component Value Date/Time   TSH 1.270 12/25/2017 1021   TSH 0.75 06/12/2017 1111   TSH 0.87 11/09/2015 0940   Results for KEALY, LEWTER (MRN [id number]) as of 03/12/2018 17:23  Ref. Range 12/25/2017 10:21  Vitamin D, 25-Hydroxy Latest Ref  Range: 30.0 - 100.0 ng/mL 29.2 (L)   ASSESSMENT AND PLAN: Essential hypertension  Class 1 obesity with serious comorbidity and body mass index (BMI) of 32.0 to 32.9 in adult, unspecified obesity type  PLAN:  Hypertension We discussed sodium restriction, working on healthy weight loss, and a regular exercise program as the means to achieve improved blood pressure control. Patrizia agreed with this plan and agreed to follow up as directed. We will continue to monitor her blood pressure as well as her progress with the above lifestyle modifications. She will continue her medications as prescribed and will watch for signs of hypotension as she continues her lifestyle modifications.  We spent > than 50% of the 15 minute visit on the counseling as documented in the note.  Obesity Neli is currently in the action stage of change. As such, her goal is to continue with weight loss efforts She has agreed to portion control better and make smarter food choices, such as increase vegetables and decrease simple carbohydrates  Chirstine has been instructed to work up to a goal of 150 minutes of combined cardio and strengthening exercise per week for weight loss and overall health benefits. We discussed the following Behavioral Modification Strategies today: increasing lean protein intake and celebration eating strategies  Towanna has agreed to follow up with our clinic in 2 weeks. She was informed of the importance of frequent follow up visits to maximize her success with intensive lifestyle modifications for her multiple health conditions.    OBESITY BEHAVIORAL INTERVENTION  VISIT  Today's visit was # 6 out of 22.  Starting weight: 196 lbs Starting date: 12/25/17 Today's weight : 183 lbs  Today's date: 03/12/2018 Total lbs lost to date: 13 (Patients must lose 7 lbs in the first 6 months to continue with counseling)   ASK: We discussed the diagnosis of obesity with Gladis Riffle today and Raniyah agreed to give [place] permission to discuss obesity behavioral modification therapy today.  ASSESS: Katlyne has the diagnosis of obesity and her BMI today is 32.42 Alyssia is in the action stage of change   ADVISE: Faizah was educated on the multiple health risks of obesity as well as the benefit of weight loss to improve her health. She was advised of the need for long term treatment and the importance of lifestyle modifications.  AGREE: Multiple dietary modification options and treatment options were discussed and  Maricia agreed to the above obesity treatment plan.   Corey Skains, am acting as transcriptionist for [employer], PA-C  I, Lacy Duverney [facility], have reviewed this note and agree with its content

## 2018-04-02 ENCOUNTER — Ambulatory Visit (INDEPENDENT_AMBULATORY_CARE_PROVIDER_SITE_OTHER): Payer: Medicare Other | Admitting: Physician Assistant

## 2018-04-02 VITALS — BP 160/85 | HR 66 | Temp 97.4°F | Ht 63.0 in | Wt 183.0 lb

## 2018-04-02 DIAGNOSIS — E66811 Obesity, class 1: Secondary | ICD-10-CM

## 2018-04-02 DIAGNOSIS — R7303 Prediabetes: Secondary | ICD-10-CM

## 2018-04-02 DIAGNOSIS — E669 Obesity, unspecified: Secondary | ICD-10-CM | POA: Diagnosis not present

## 2018-04-02 DIAGNOSIS — I10 Essential (primary) hypertension: Secondary | ICD-10-CM

## 2018-04-02 DIAGNOSIS — Z6832 Body mass index (BMI) 32.0-32.9, adult: Secondary | ICD-10-CM

## 2018-04-02 MED ORDER — AMLODIPINE BESYLATE 5 MG PO TABS: 5.0000 mg | ORAL_TABLET | Freq: Every day | ORAL | 0 refills | Status: DC

## 2018-04-03 NOTE — Progress Notes (Signed)
Office: [phone]  /  Fax: [phone]   HPI:   Chief Complaint: OBESITY Diane Sawyer is here to discuss her progress with her obesity treatment plan. She is on the portion control better and make smarter food choices, such as increase vegetables and decrease simple carbohydrates  and is following her eating plan approximately 70 % of the time. She states she is exercising 0 minutes 0 times per week. Diane Sawyer had celebration eating and was on vacation. She is motivated to get back on track and continue with weight loss.  Her weight is 183 lb (83 kg) today and has not lost weight since her last visit. She has lost 13 lbs since starting treatment with [place].  Hypertension Diane Sawyer is a 72 y.o. female with hypertension. Diane Sawyer's blood pressure is elevated. She is on amlodipine 5 mg but has only been taking half a pill. She denies chest pain or shortness of breath. She is working weight loss to help control her blood pressure with the goal of decreasing her risk of heart attack and stroke. Diane Sawyer's blood pressure is not currently controlled.  Pre-Diabetes Diane Sawyer has a diagnosis of pre-diabetes based on her elevated Hgb A1c and was informed this puts her at greater risk of developing diabetes. She is not on metformin, declines and continues to work on diet and exercise to decrease risk of diabetes. She denies nausea or hypoglycemia.  ALLERGIES: Allergies  Allergen Reactions  . Benazepril Hcl     REACTION: achy- makes her feel strange.  . Levofloxacin Nausea Only  . Mobic [Meloxicam] Other (See Comments)    Felt like chest going to explode    MEDICATIONS: Current Outpatient Medications on File Prior to Visit  Medication Sig Dispense Refill  . acetaminophen (TYLENOL) 500 MG tablet Take 1,000 mg by mouth every 6 (six) hours as needed for mild pain.    [name] ALPRAZolam (XANAX) 0.25 MG tablet Take 1 tablet (0.25 mg total) by mouth 2 (two) times daily as needed for anxiety. 60 tablet 3  . Cholecalciferol  1000 units capsule Take 2 capsules (2,000 Units total) by mouth daily. 100 capsule 11  . clobetasol cream (TEMOVATE) 2.22 % Apply 1 application topically daily as needed (dermatosis). Reported on 05/23/2016    . etodolac (LODINE) 500 MG tablet Take 500 mg by mouth daily.    . fexofenadine (ALLEGRA) 180 MG tablet Take 180 mg by mouth daily.      . hydroxypropyl methylcellulose / hypromellose (ISOPTO TEARS / GONIOVISC) 2.5 % ophthalmic solution Place 1 drop into both eyes as needed for dry eyes.    . IRON PO Take 65 mg by mouth daily.     . methocarbamol (ROBAXIN) 500 MG tablet Take 1 tablet (500 mg total) by mouth every 8 (eight) hours as needed for muscle spasms. 60 tablet 1  . pantoprazole (PROTONIX) 40 MG tablet TAKE 1 TABLET BY MOUTH EVERY DAY 90 tablet 1  . timolol (BETIMOL) 0.25 % ophthalmic solution Place 1 drop into the right eye daily.      No current facility-administered medications on file prior to visit.     PAST MEDICAL HISTORY: Past Medical History:  Diagnosis Date  . Allergic rhinitis   . Anemia   . Anxiety   . Back pain   . Diverticulosis   . GERD (gastroesophageal reflux disease)   . Glaucoma   . Hypertension   . Lumbar compression fracture ([place]) 09/07/2016   x 2   . Menopause   .  Vitamin D deficiency     PAST SURGICAL HISTORY: Past Surgical History:  Procedure Laterality Date  . CATARACT EXTRACTION, BILATERAL  04/06/2017   with cypass stent implanted  . NO PAST SURGERIES      SOCIAL HISTORY: Social History   Tobacco Use  . Smoking status: Never Smoker  . Smokeless tobacco: Never Used  Substance Use Topics  . Alcohol use: No  . Drug use: No    FAMILY HISTORY: Family History  Problem Relation Age of Onset  . Stroke Mother 93  . Hypertension Mother   . Sudden death Mother   . Anxiety disorder Mother   . Lung cancer Father 37  . Colon cancer Neg Hx     ROS: Review of Systems  Constitutional: Negative for weight loss.  Respiratory: Negative  for shortness of breath.   Cardiovascular: Negative for chest pain.  Gastrointestinal: Negative for nausea.  Endo/Heme/Allergies:       Negative hypoglycemia    PHYSICAL EXAM: Blood pressure (!) 160/85, pulse 66, temperature (!) 97.4 F (36.3 C), temperature source Oral, height 5\' 3"  (1.6 m), weight 183 lb (83 kg), SpO2 99 %. Body mass index is 32.42 kg/m. Physical Exam  Constitutional: She is oriented to person, place, and time. She appears well-developed and well-nourished.  Cardiovascular: Normal rate.  Pulmonary/Chest: Effort normal.  Musculoskeletal: Normal range of motion.  Neurological: She is oriented to person, place, and time.  Skin: Skin is warm and dry.  Psychiatric: She has a normal mood and affect. Her behavior is normal.  Vitals reviewed.   RECENT LABS AND TESTS: BMET    Component Value Date/Time   NA 142 12/25/2017 1021   K 4.1 12/25/2017 1021   CL 102 12/25/2017 1021   CO2 25 12/25/2017 1021   GLUCOSE 98 12/25/2017 1021   GLUCOSE 94 12/11/2017 0857   BUN 12 12/25/2017 1021   CREATININE 0.72 12/25/2017 1021   CALCIUM 9.2 12/25/2017 1021   GFRNONAA 85 12/25/2017 1021   GFRAA 98 12/25/2017 1021   Lab Results  Component Value Date   HGBA1C 5.8 (H) 12/25/2017   Lab Results  Component Value Date   INSULIN 9.0 12/25/2017   CBC    Component Value Date/Time   WBC 6.2 12/25/2017 1021   WBC 6.3 06/12/2017 1111   RBC 4.72 12/25/2017 1021   RBC 4.63 06/12/2017 1111   HGB 14.0 12/25/2017 1021   HCT 41.5 12/25/2017 1021   PLT 238.0 06/12/2017 1111   MCV 88 12/25/2017 1021   MCH 29.7 12/25/2017 1021   MCH 29.6 10/01/2016 0525   MCHC 33.7 12/25/2017 1021   MCHC 33.5 06/12/2017 1111   RDW 13.7 12/25/2017 1021   LYMPHSABS 1.4 12/25/2017 1021   MONOABS 0.5 06/12/2017 1111   EOSABS 0.1 12/25/2017 1021   BASOSABS 0.0 12/25/2017 1021   Iron/TIBC/Ferritin/ %Sat    Component Value Date/Time   IRON 82 05/23/2016 1005   IRONPCTSAT 24.9 05/23/2016 1005    Lipid Panel     Component Value Date/Time   CHOL 191 12/25/2017 1021   TRIG 114 12/25/2017 1021   HDL 61 12/25/2017 1021   CHOLHDL 3 06/12/2017 1111   VLDL 23.6 06/12/2017 1111   LDLCALC 107 (H) 12/25/2017 1021   Hepatic Function Panel     Component Value Date/Time   PROT 7.0 12/25/2017 1021   ALBUMIN 4.5 12/25/2017 1021   AST 15 12/25/2017 1021   ALT 9 12/25/2017 1021   ALKPHOS 73 12/25/2017 1021  BILITOT 0.3 12/25/2017 1021   BILIDIR 0.0 06/12/2017 1111      Component Value Date/Time   TSH 1.270 12/25/2017 1021   TSH 0.75 06/12/2017 1111   TSH 0.87 11/09/2015 0940    ASSESSMENT AND PLAN: Essential hypertension - Plan: amLODipine (NORVASC) 5 MG tablet  Prediabetes  Class 1 obesity with serious comorbidity and body mass index (BMI) of 32.0 to 32.9 in adult, unspecified obesity type  PLAN:  Hypertension We discussed sodium restriction, working on healthy weight loss, and a regular exercise program as the means to achieve improved blood pressure control. Diane Sawyer agreed with this plan and agreed to follow up as directed. We will continue to monitor her blood pressure as well as her progress with the above lifestyle modifications. Diane Sawyer agrees to increase amlodipine to 5 mg daily and will watch for signs of hypotension as she continues her lifestyle modifications. Diane Sawyer agrees to follow up with our clinic in 2 weeks.  Pre-Diabetes Diane Sawyer will continue to work on weight loss, diet, exercise, and decreasing simple carbohydrates in her diet to help decrease the risk of diabetes. We dicussed metformin including benefits and risks. She was informed that eating too many simple carbohydrates or too many calories at one sitting increases the likelihood of GI side effects. Diane Sawyer declined metformin for now and a prescription was not written today. Diane Sawyer agrees to follow up with our clinic in 2 weeks as directed to monitor her progress.  Obesity Diane Sawyer is currently in the action stage  of change. As such, her goal is to continue with weight loss efforts She has agreed to follow the Category 2 plan Diane Sawyer has been instructed to work up to a goal of 150 minutes of combined cardio and strengthening exercise per week for weight loss and overall health benefits. We discussed the following Behavioral Modification Strategies today: increasing lean protein intake and work on meal planning and easy cooking plans   Diane Sawyer has agreed to follow up with our clinic in 2 weeks. She was informed of the importance of frequent follow up visits to maximize her success with intensive lifestyle modifications for her multiple health conditions.   OBESITY BEHAVIORAL INTERVENTION VISIT  Today's visit was # 7 out of 22.  Starting weight: 196 lbs Starting date: 12/25/17 Today's weight : 183 lbs  Today's date: 04/02/2018 Total lbs lost to date: 13 (Patients must lose 7 lbs in the first 6 months to continue with counseling)   ASK: We discussed the diagnosis of obesity with Diane Sawyer today and Diane Sawyer agreed to give [place] permission to discuss obesity behavioral modification therapy today.  ASSESS: Diane Sawyer has the diagnosis of obesity and her BMI today is 32.42 Diane Sawyer is in the action stage of change   ADVISE: Loie was educated on the multiple health risks of obesity as well as the benefit of weight loss to improve her health. She was advised of the need for long term treatment and the importance of lifestyle modifications.  AGREE: Multiple dietary modification options and treatment options were discussed and  Diane Sawyer agreed to the above obesity treatment plan.   Diane Sawyer, am acting as transcriptionist for Lacy Duverney, PA-C I, Lacy Duverney [facility], have reviewed this note and agree with its content

## 2018-04-16 ENCOUNTER — Ambulatory Visit (INDEPENDENT_AMBULATORY_CARE_PROVIDER_SITE_OTHER): Payer: Medicare Other | Admitting: Physician Assistant

## 2018-04-16 VITALS — BP 142/83 | HR 71 | Temp 97.7°F | Ht 63.0 in | Wt 182.0 lb

## 2018-04-16 DIAGNOSIS — I10 Essential (primary) hypertension: Secondary | ICD-10-CM | POA: Diagnosis not present

## 2018-04-16 DIAGNOSIS — Z6832 Body mass index (BMI) 32.0-32.9, adult: Secondary | ICD-10-CM

## 2018-04-16 DIAGNOSIS — E669 Obesity, unspecified: Secondary | ICD-10-CM

## 2018-04-16 NOTE — Progress Notes (Signed)
Office: [phone]  /  Fax: [phone]   HPI:   Chief Complaint: OBESITY Diane Sawyer is here to discuss her progress with her obesity treatment plan. She is on the Category 2 plan and is following her eating plan approximately 85 % of the time. She states she is walking 20 minutes 2 times per week. Diane Sawyer continues to do well with weight loss, despite vacation. She managed to be mindful of her eating and she controlled her portions. Her weight is 182 lb (82.6 kg) today and has had a weight loss of 1 pound over a period of 2 weeks since her last visit. She has lost 14 lbs since starting treatment with [place].  Hypertension Diane Sawyer is a 72 y.o. female with hypertension. Her blood pressure is elevated. Diane Sawyer was instructed to increase amlodipine to 5 mg once daily, but has only been taking half a pill. Diane Sawyer denies chest pain or shortness of breath on exertion. She is working weight loss to help control her blood pressure with the goal of decreasing her risk of heart attack and stroke. Diane Sawyer blood pressure is not currently controlled.  ALLERGIES: Allergies  Allergen Reactions  . Benazepril Hcl     REACTION: achy- makes her feel strange.  . Levofloxacin Nausea Only  . Mobic [Meloxicam] Other (See Comments)    Felt like chest going to explode    MEDICATIONS: Current Outpatient Medications on File Prior to Visit  Medication Sig Dispense Refill  . acetaminophen (TYLENOL) 500 MG tablet Take 1,000 mg by mouth every 6 (six) hours as needed for mild pain.    [name] ALPRAZolam (XANAX) 0.25 MG tablet Take 1 tablet (0.25 mg total) by mouth 2 (two) times daily as needed for anxiety. 60 tablet 3  . amLODipine (NORVASC) 5 MG tablet Take 1 tablet (5 mg total) by mouth daily. 30 tablet 0  . Cholecalciferol 1000 units capsule Take 2 capsules (2,000 Units total) by mouth daily. 100 capsule 11  . clobetasol cream (TEMOVATE) 6.57 % Apply 1 application topically daily as needed (dermatosis). Reported on  05/23/2016    . etodolac (LODINE) 500 MG tablet Take 500 mg by mouth daily.    . fexofenadine (ALLEGRA) 180 MG tablet Take 180 mg by mouth daily.      . hydroxypropyl methylcellulose / hypromellose (ISOPTO TEARS / GONIOVISC) 2.5 % ophthalmic solution Place 1 drop into both eyes as needed for dry eyes.    . IRON PO Take 65 mg by mouth daily.     . methocarbamol (ROBAXIN) 500 MG tablet Take 1 tablet (500 mg total) by mouth every 8 (eight) hours as needed for muscle spasms. 60 tablet 1  . pantoprazole (PROTONIX) 40 MG tablet TAKE 1 TABLET BY MOUTH EVERY DAY 90 tablet 1  . timolol (BETIMOL) 0.25 % ophthalmic solution Place 1 drop into the right eye daily.      No current facility-administered medications on file prior to visit.     PAST MEDICAL HISTORY: Past Medical History:  Diagnosis Date  . Allergic rhinitis   . Anemia   . Anxiety   . Back pain   . Diverticulosis   . GERD (gastroesophageal reflux disease)   . Glaucoma   . Hypertension   . Lumbar compression fracture ([place]) 09/07/2016   x 2   . Menopause   . Vitamin D deficiency     PAST SURGICAL HISTORY: Past Surgical History:  Procedure Laterality Date  . CATARACT EXTRACTION, BILATERAL  04/06/2017  with cypass stent implanted  . NO PAST SURGERIES      SOCIAL HISTORY: Social History   Tobacco Use  . Smoking status: Never Smoker  . Smokeless tobacco: Never Used  Substance Use Topics  . Alcohol use: No  . Drug use: No    FAMILY HISTORY: Family History  Problem Relation Age of Onset  . Stroke Mother 28  . Hypertension Mother   . Sudden death Mother   . Anxiety disorder Mother   . Lung cancer Father 94  . Colon cancer Neg Hx     ROS: Review of Systems  Constitutional: Positive for weight loss.  Respiratory: Negative for shortness of breath (on exertion).   Cardiovascular: Negative for chest pain.    PHYSICAL EXAM: Blood pressure (!) 142/83, pulse 71, temperature 97.7 F (36.5 C), temperature source Oral,  height 5\' 3"  (1.6 m), weight 182 lb (82.6 kg), SpO2 97 %. Body mass index is 32.24 kg/m. Physical Exam  Constitutional: She is oriented to person, place, and time. She appears well-developed and well-nourished.  Cardiovascular: Normal rate.  Pulmonary/Chest: Effort normal.  Musculoskeletal: Normal range of motion.  Neurological: She is oriented to person, place, and time.  Skin: Skin is warm and dry.  Psychiatric: She has a normal mood and affect. Her behavior is normal.  Vitals reviewed.   RECENT LABS AND TESTS: BMET    Component Value Date/Time   NA 142 12/25/2017 1021   K 4.1 12/25/2017 1021   CL 102 12/25/2017 1021   CO2 25 12/25/2017 1021   GLUCOSE 98 12/25/2017 1021   GLUCOSE 94 12/11/2017 0857   BUN 12 12/25/2017 1021   CREATININE 0.72 12/25/2017 1021   CALCIUM 9.2 12/25/2017 1021   GFRNONAA 85 12/25/2017 1021   GFRAA 98 12/25/2017 1021   Lab Results  Component Value Date   HGBA1C 5.8 (H) 12/25/2017   Lab Results  Component Value Date   INSULIN 9.0 12/25/2017   CBC    Component Value Date/Time   WBC 6.2 12/25/2017 1021   WBC 6.3 06/12/2017 1111   RBC 4.72 12/25/2017 1021   RBC 4.63 06/12/2017 1111   HGB 14.0 12/25/2017 1021   HCT 41.5 12/25/2017 1021   PLT 238.0 06/12/2017 1111   MCV 88 12/25/2017 1021   MCH 29.7 12/25/2017 1021   MCH 29.6 10/01/2016 0525   MCHC 33.7 12/25/2017 1021   MCHC 33.5 06/12/2017 1111   RDW 13.7 12/25/2017 1021   LYMPHSABS 1.4 12/25/2017 1021   MONOABS 0.5 06/12/2017 1111   EOSABS 0.1 12/25/2017 1021   BASOSABS 0.0 12/25/2017 1021   Iron/TIBC/Ferritin/ %Sat    Component Value Date/Time   IRON 82 05/23/2016 1005   IRONPCTSAT 24.9 05/23/2016 1005   Lipid Panel     Component Value Date/Time   CHOL 191 12/25/2017 1021   TRIG 114 12/25/2017 1021   HDL 61 12/25/2017 1021   CHOLHDL 3 06/12/2017 1111   VLDL 23.6 06/12/2017 1111   LDLCALC 107 (H) 12/25/2017 1021   Hepatic Function Panel     Component Value Date/Time     PROT 7.0 12/25/2017 1021   ALBUMIN 4.5 12/25/2017 1021   AST 15 12/25/2017 1021   ALT 9 12/25/2017 1021   ALKPHOS 73 12/25/2017 1021   BILITOT 0.3 12/25/2017 1021   BILIDIR 0.0 06/12/2017 1111      Component Value Date/Time   TSH 1.270 12/25/2017 1021   TSH 0.75 06/12/2017 1111   TSH 0.87 11/09/2015 0940   Results  for REBEKA, KIMBLE (MRN [id number]) as of 04/16/2018 16:22  Ref. Range 12/25/2017 10:21  Vitamin D, 25-Hydroxy Latest Ref Range: 30.0 - 100.0 ng/mL 29.2 (L)   ASSESSMENT AND PLAN: Essential hypertension  Class 1 obesity with serious comorbidity and body mass index (BMI) of 32.0 to 32.9 in adult, unspecified obesity type  PLAN:  Hypertension We discussed sodium restriction, working on healthy weight loss, and a regular exercise program as the means to achieve improved blood pressure control. Diane Sawyer agreed with this plan and agreed to follow up as directed. We will continue to monitor her blood pressure as well as her progress with the above lifestyle modifications. She will increase amlodipine to 5 mg (no refills needed) and will watch for signs of hypotension as she continues her lifestyle modifications.  We spent > than 50% of the 15 minute visit on the counseling as documented in the note.  Obesity Diane Sawyer is currently in the action stage of change. As such, her goal is to continue with weight loss efforts She has agreed to follow the Category 2 plan Diane Sawyer has been instructed to work up to a goal of 150 minutes of combined cardio and strengthening exercise per week for weight loss and overall health benefits. We discussed the following Behavioral Modification Strategies today: increasing lean protein intake and no skipping meals  Diane Sawyer has agreed to follow up with our clinic in 2 weeks. She was informed of the importance of frequent follow up visits to maximize her success with intensive lifestyle modifications for her multiple health conditions.   OBESITY BEHAVIORAL  INTERVENTION VISIT  Today's visit was # 8 out of 22.  Starting weight: 196 lbs Starting date: 12/25/17 Today's weight : 182 lbs Today's date: 04/16/2018 Total lbs lost to date: 14 (Patients must lose 7 lbs in the first 6 months to continue with counseling)   ASK: We discussed the diagnosis of obesity with Diane Sawyer today and Diane Sawyer agreed to give [place] permission to discuss obesity behavioral modification therapy today.  ASSESS: Diane Sawyer has the diagnosis of obesity and her BMI today is 32.25 Diane Sawyer is in the action stage of change   ADVISE: Diane Sawyer was educated on the multiple health risks of obesity as well as the benefit of weight loss to improve her health. She was advised of the need for long term treatment and the importance of lifestyle modifications.  AGREE: Multiple dietary modification options and treatment options were discussed and  Diane Sawyer agreed to the above obesity treatment plan.   Corey Skains, am acting as transcriptionist for [employer], PA-C I, Lacy Duverney [facility], have reviewed this note and agree with its content

## 2018-04-28 ENCOUNTER — Other Ambulatory Visit: Payer: Self-pay | Admitting: Internal Medicine

## 2018-05-07 ENCOUNTER — Ambulatory Visit (INDEPENDENT_AMBULATORY_CARE_PROVIDER_SITE_OTHER): Payer: Medicare Other | Admitting: Family Medicine

## 2018-05-14 DIAGNOSIS — H401134 Primary open-angle glaucoma, bilateral, indeterminate stage: Secondary | ICD-10-CM | POA: Diagnosis not present

## 2018-05-30 ENCOUNTER — Ambulatory Visit (INDEPENDENT_AMBULATORY_CARE_PROVIDER_SITE_OTHER): Payer: Medicare Other | Admitting: Family Medicine

## 2018-05-30 VITALS — BP 122/77 | HR 66 | Temp 98.2°F | Ht 63.0 in | Wt 181.0 lb

## 2018-05-30 DIAGNOSIS — E669 Obesity, unspecified: Secondary | ICD-10-CM | POA: Diagnosis not present

## 2018-05-30 DIAGNOSIS — Z6832 Body mass index (BMI) 32.0-32.9, adult: Secondary | ICD-10-CM

## 2018-05-30 DIAGNOSIS — E559 Vitamin D deficiency, unspecified: Secondary | ICD-10-CM

## 2018-05-30 DIAGNOSIS — I10 Essential (primary) hypertension: Secondary | ICD-10-CM | POA: Diagnosis not present

## 2018-05-30 NOTE — Progress Notes (Signed)
Office: [phone]  /  Fax: [phone]   HPI:   Chief Complaint: OBESITY Diane Sawyer is here to discuss her progress with her obesity treatment plan. She is on the Category 2 plan and is following her eating plan approximately 50-70 % of the time. She states she is exercising 0 minutes 0 times per week. Joanie had 1 week vacation at the beach for 1 week and has gotten back on track with meal plan. Has maybe eaten a few more carbohydrates at meals than on Category 2 plan.  Her weight is 181 lb (82.1 kg) today and has had a weight loss of 1 pound over a period of 6 to 7 weeks since her last visit. She has lost 15 lbs since starting treatment with [place].  Hypertension Diane Sawyer is a 72 y.o. female with hypertension. Diane Sawyer's blood pressure is controlled today (just increased amlodipine). She denies chest pain, chest pressure, or headache. She is working weight loss to help control her blood pressure with the goal of decreasing her risk of heart attack and stroke.  Vitamin D Deficiency Diane Sawyer has a diagnosis of vitamin D deficiency. She is on OTC Vit D 2,000 IU daily and denies nausea, vomiting or muscle weakness.  ALLERGIES: Allergies  Allergen Reactions  . Benazepril Hcl     REACTION: achy- makes her feel strange.  . Levofloxacin Nausea Only  . Mobic [Meloxicam] Other (See Comments)    Felt like chest going to explode    MEDICATIONS: Current Outpatient Medications on File Prior to Visit  Medication Sig Dispense Refill  . acetaminophen (TYLENOL) 500 MG tablet Take 1,000 mg by mouth every 6 (six) hours as needed for mild pain.    [name] ALPRAZolam (XANAX) 0.25 MG tablet Take 1 tablet (0.25 mg total) by mouth 2 (two) times daily as needed for anxiety. 60 tablet 3  . amLODipine (NORVASC) 5 MG tablet TAKE 1 TABLET (5 MG TOTAL) BY MOUTH DAILY. TAKE 1 TABLET EVERY DAY 90 tablet 1  . Cholecalciferol 1000 units capsule Take 2 capsules (2,000 Units total) by mouth daily. 100 capsule 11  . clobetasol  cream (TEMOVATE) 2.13 % Apply 1 application topically daily as needed (dermatosis). Reported on 05/23/2016    . etodolac (LODINE) 500 MG tablet Take 500 mg by mouth daily.    . fexofenadine (ALLEGRA) 180 MG tablet Take 180 mg by mouth daily.      . IRON PO Take 65 mg by mouth daily.     . methocarbamol (ROBAXIN) 500 MG tablet Take 1 tablet (500 mg total) by mouth every 8 (eight) hours as needed for muscle spasms. 60 tablet 1  . pantoprazole (PROTONIX) 40 MG tablet TAKE 1 TABLET BY MOUTH EVERY DAY 90 tablet 1  . timolol (BETIMOL) 0.25 % ophthalmic solution Place 1 drop into the right eye daily.      No current facility-administered medications on file prior to visit.     PAST MEDICAL HISTORY: Past Medical History:  Diagnosis Date  . Allergic rhinitis   . Anemia   . Anxiety   . Back pain   . Diverticulosis   . GERD (gastroesophageal reflux disease)   . Glaucoma   . Hypertension   . Lumbar compression fracture ([place]) 09/07/2016   x 2   . Menopause   . Vitamin D deficiency     PAST SURGICAL HISTORY: Past Surgical History:  Procedure Laterality Date  . CATARACT EXTRACTION, BILATERAL  04/06/2017   with cypass stent implanted  .  NO PAST SURGERIES      SOCIAL HISTORY: Social History   Tobacco Use  . Smoking status: Never Smoker  . Smokeless tobacco: Never Used  Substance Use Topics  . Alcohol use: No  . Drug use: No    FAMILY HISTORY: Family History  Problem Relation Age of Onset  . Stroke Mother 21  . Hypertension Mother   . Sudden death Mother   . Anxiety disorder Mother   . Lung cancer Father 49  . Colon cancer Neg Hx     ROS: Review of Systems  Constitutional: Positive for weight loss.  Cardiovascular: Negative for chest pain.       Negative chest pressure  Gastrointestinal: Negative for nausea and vomiting.  Musculoskeletal:       Negative muscle weakness  Neurological: Negative for headaches.    PHYSICAL EXAM: Blood pressure 122/77, pulse 66,  temperature 98.2 F (36.8 C), temperature source Oral, height 5\' 3"  (1.6 m), weight 181 lb (82.1 kg), SpO2 99 %. Body mass index is 32.06 kg/m. Physical Exam  Constitutional: She is oriented to person, place, and time. She appears well-developed and well-nourished.  Cardiovascular: Normal rate.  Pulmonary/Chest: Effort normal.  Musculoskeletal: Normal range of motion.  Neurological: She is oriented to person, place, and time.  Skin: Skin is warm and dry.  Psychiatric: She has a normal mood and affect. Her behavior is normal.  Vitals reviewed.   RECENT LABS AND TESTS: BMET    Component Value Date/Time   NA 142 12/25/2017 1021   K 4.1 12/25/2017 1021   CL 102 12/25/2017 1021   CO2 25 12/25/2017 1021   GLUCOSE 98 12/25/2017 1021   GLUCOSE 94 12/11/2017 0857   BUN 12 12/25/2017 1021   CREATININE 0.72 12/25/2017 1021   CALCIUM 9.2 12/25/2017 1021   GFRNONAA 85 12/25/2017 1021   GFRAA 98 12/25/2017 1021   Lab Results  Component Value Date   HGBA1C 5.8 (H) 12/25/2017   Lab Results  Component Value Date   INSULIN 9.0 12/25/2017   CBC    Component Value Date/Time   WBC 6.2 12/25/2017 1021   WBC 6.3 06/12/2017 1111   RBC 4.72 12/25/2017 1021   RBC 4.63 06/12/2017 1111   HGB 14.0 12/25/2017 1021   HCT 41.5 12/25/2017 1021   PLT 238.0 06/12/2017 1111   MCV 88 12/25/2017 1021   MCH 29.7 12/25/2017 1021   MCH 29.6 10/01/2016 0525   MCHC 33.7 12/25/2017 1021   MCHC 33.5 06/12/2017 1111   RDW 13.7 12/25/2017 1021   LYMPHSABS 1.4 12/25/2017 1021   MONOABS 0.5 06/12/2017 1111   EOSABS 0.1 12/25/2017 1021   BASOSABS 0.0 12/25/2017 1021   Iron/TIBC/Ferritin/ %Sat    Component Value Date/Time   IRON 82 05/23/2016 1005   IRONPCTSAT 24.9 05/23/2016 1005   Lipid Panel     Component Value Date/Time   CHOL 191 12/25/2017 1021   TRIG 114 12/25/2017 1021   HDL 61 12/25/2017 1021   CHOLHDL 3 06/12/2017 1111   VLDL 23.6 06/12/2017 1111   LDLCALC 107 (H) 12/25/2017 1021    Hepatic Function Panel     Component Value Date/Time   PROT 7.0 12/25/2017 1021   ALBUMIN 4.5 12/25/2017 1021   AST 15 12/25/2017 1021   ALT 9 12/25/2017 1021   ALKPHOS 73 12/25/2017 1021   BILITOT 0.3 12/25/2017 1021   BILIDIR 0.0 06/12/2017 1111      Component Value Date/Time   TSH 1.270 12/25/2017 1021  TSH 0.75 06/12/2017 1111   TSH 0.87 11/09/2015 0940  Results for SEBRENA, ENGH (MRN [id number]) as of 05/30/2018 08:51  Ref. Range 12/25/2017 10:21  Vitamin D, 25-Hydroxy Latest Ref Range: 30.0 - 100.0 ng/mL 29.2 (L)    ASSESSMENT AND PLAN: Essential hypertension  Vitamin D deficiency  Class 1 obesity with serious comorbidity and body mass index (BMI) of 32.0 to 32.9 in adult, unspecified obesity type  PLAN:  Hypertension We discussed sodium restriction, working on healthy weight loss, and a regular exercise program as the means to achieve improved blood pressure control. Diane Sawyer agreed with this plan and agreed to follow up as directed. We will continue to monitor her blood pressure as well as her progress with the above lifestyle modifications. She will continue her medications as prescribed and will watch for signs of hypotension as she continues her lifestyle modifications. We will follow up on blood pressure at next appointment and Diane Sawyer agrees to follow up with our clinic in 2 weeks.  Vitamin D Deficiency Diane Sawyer was informed that low vitamin D levels contributes to fatigue and are associated with obesity, breast, and colon cancer. Diane Sawyer agrees to continue taking current OTC Vit D and will follow up for routine testing of vitamin D, at least 2-3 times per year. She was informed of the risk of over-replacement of vitamin D and agrees to not increase her dose unless she discusses this with [place] first. Diane Sawyer agrees to follow up with our clinic in 2 weeks.  We spent > than 50% of the 15 minute visit on the counseling as documented in the note.  Obesity Diane Sawyer is currently in the  action stage of change. As such, her goal is to continue with weight loss efforts She has agreed to follow the Category 2 plan Diane Sawyer has been instructed to work up to a goal of 150 minutes of combined cardio and strengthening exercise per week for weight loss and overall health benefits. We discussed the following Behavioral Modification Strategies today: increasing lean protein intake, increasing vegetables, work on meal planning and easy cooking plans, and planning for success   Diane Sawyer has agreed to follow up with our clinic in 2 weeks. She was informed of the importance of frequent follow up visits to maximize her success with intensive lifestyle modifications for her multiple health conditions.   OBESITY BEHAVIORAL INTERVENTION VISIT  Today's visit was # 9 out of 22.  Starting weight: 196 lbs Starting date: 12/25/17 Today's weight : 181 lbs  Today's date: 05/30/2018 Total lbs lost to date: 15 (Patients must lose 7 lbs in the first 6 months to continue with counseling)   ASK: We discussed the diagnosis of obesity with Diane Sawyer today and Diane Sawyer agreed to give [place] permission to discuss obesity behavioral modification therapy today.  ASSESS: Diane Sawyer has the diagnosis of obesity and her BMI today is 32.07 Diane Sawyer is in the action stage of change   ADVISE: Diane Sawyer was educated on the multiple health risks of obesity as well as the benefit of weight loss to improve her health. She was advised of the need for long term treatment and the importance of lifestyle modifications.  AGREE: Multiple dietary modification options and treatment options were discussed and  Diane Sawyer agreed to the above obesity treatment plan.  I, Trixie Dredge, am acting as transcriptionist for Ilene Qua, MD  I have reviewed the above documentation for accuracy and completeness, and I agree with the above. - Ilene Qua, MD

## 2018-06-11 ENCOUNTER — Ambulatory Visit: Payer: Medicare Other | Admitting: Internal Medicine

## 2018-06-17 ENCOUNTER — Encounter (HOSPITAL_COMMUNITY): Payer: Self-pay | Admitting: Emergency Medicine

## 2018-06-17 ENCOUNTER — Ambulatory Visit (INDEPENDENT_AMBULATORY_CARE_PROVIDER_SITE_OTHER): Payer: Medicare Other

## 2018-06-17 ENCOUNTER — Ambulatory Visit (HOSPITAL_COMMUNITY)
Admission: EM | Admit: 2018-06-17 | Discharge: 2018-06-17 | Disposition: A | Discharge status: Home / Self Care | Payer: Medicare Other | Attending: Internal Medicine | Admitting: Internal Medicine

## 2018-06-17 DIAGNOSIS — R1011 Right upper quadrant pain: Secondary | ICD-10-CM | POA: Insufficient documentation

## 2018-06-17 DIAGNOSIS — Z886 Allergy status to analgesic agent status: Secondary | ICD-10-CM | POA: Diagnosis not present

## 2018-06-17 DIAGNOSIS — Z9841 Cataract extraction status, right eye: Secondary | ICD-10-CM | POA: Insufficient documentation

## 2018-06-17 DIAGNOSIS — Z823 Family history of stroke: Secondary | ICD-10-CM | POA: Diagnosis not present

## 2018-06-17 DIAGNOSIS — Z8249 Family history of ischemic heart disease and other diseases of the circulatory system: Secondary | ICD-10-CM | POA: Diagnosis not present

## 2018-06-17 DIAGNOSIS — F419 Anxiety disorder, unspecified: Secondary | ICD-10-CM | POA: Diagnosis not present

## 2018-06-17 DIAGNOSIS — E559 Vitamin D deficiency, unspecified: Secondary | ICD-10-CM | POA: Diagnosis not present

## 2018-06-17 DIAGNOSIS — Z801 Family history of malignant neoplasm of trachea, bronchus and lung: Secondary | ICD-10-CM | POA: Insufficient documentation

## 2018-06-17 DIAGNOSIS — I10 Essential (primary) hypertension: Secondary | ICD-10-CM | POA: Diagnosis not present

## 2018-06-17 DIAGNOSIS — Z888 Allergy status to other drugs, medicaments and biological substances status: Secondary | ICD-10-CM | POA: Insufficient documentation

## 2018-06-17 DIAGNOSIS — K219 Gastro-esophageal reflux disease without esophagitis: Secondary | ICD-10-CM | POA: Diagnosis not present

## 2018-06-17 DIAGNOSIS — R062 Wheezing: Secondary | ICD-10-CM

## 2018-06-17 DIAGNOSIS — Z6831 Body mass index (BMI) 31.0-31.9, adult: Secondary | ICD-10-CM | POA: Insufficient documentation

## 2018-06-17 DIAGNOSIS — Z9842 Cataract extraction status, left eye: Secondary | ICD-10-CM | POA: Insufficient documentation

## 2018-06-17 DIAGNOSIS — E669 Obesity, unspecified: Secondary | ICD-10-CM | POA: Insufficient documentation

## 2018-06-17 DIAGNOSIS — G8929 Other chronic pain: Secondary | ICD-10-CM | POA: Diagnosis not present

## 2018-06-17 DIAGNOSIS — Z818 Family history of other mental and behavioral disorders: Secondary | ICD-10-CM | POA: Diagnosis not present

## 2018-06-17 DIAGNOSIS — R7303 Prediabetes: Secondary | ICD-10-CM | POA: Insufficient documentation

## 2018-06-17 DIAGNOSIS — M545 Low back pain: Secondary | ICD-10-CM | POA: Diagnosis not present

## 2018-06-17 DIAGNOSIS — D509 Iron deficiency anemia, unspecified: Secondary | ICD-10-CM | POA: Diagnosis not present

## 2018-06-17 DIAGNOSIS — Z79899 Other long term (current) drug therapy: Secondary | ICD-10-CM | POA: Insufficient documentation

## 2018-06-17 DIAGNOSIS — R05 Cough: Secondary | ICD-10-CM | POA: Diagnosis not present

## 2018-06-17 DIAGNOSIS — H409 Unspecified glaucoma: Secondary | ICD-10-CM | POA: Diagnosis not present

## 2018-06-17 LAB — CBC
HCT: 42 % (ref 36.0–46.0)
HEMOGLOBIN: 13.2 g/dL (ref 12.0–15.0)
MCH: 30 pg (ref 26.0–34.0)
MCHC: 31.4 g/dL (ref 30.0–36.0)
MCV: 95.5 fL (ref 78.0–100.0)
PLATELETS: 210 10*3/uL (ref 150–400)
RBC: 4.4 MIL/uL (ref 3.87–5.11)
RDW: 12.9 % (ref 11.5–15.5)
WBC: 7.3 10*3/uL (ref 4.0–10.5)

## 2018-06-17 LAB — COMPREHENSIVE METABOLIC PANEL
ALT: 12 U/L (ref 0–44)
AST: 17 U/L (ref 15–41)
Albumin: 3.7 g/dL (ref 3.5–5.0)
Alkaline Phosphatase: 45 U/L (ref 38–126)
Anion gap: 6 (ref 5–15)
BILIRUBIN TOTAL: 0.5 mg/dL (ref 0.3–1.2)
BUN: 14 mg/dL (ref 8–23)
CO2: 26 mmol/L (ref 22–32)
CREATININE: 0.7 mg/dL (ref 0.44–1.00)
Calcium: 9 mg/dL (ref 8.9–10.3)
Chloride: 108 mmol/L (ref 98–111)
Glucose, Bld: 107 mg/dL — ABNORMAL HIGH (ref 70–99)
Potassium: 4.6 mmol/L (ref 3.5–5.1)
Sodium: 140 mmol/L (ref 135–145)
TOTAL PROTEIN: 6.7 g/dL (ref 6.5–8.1)

## 2018-06-17 LAB — LIPASE, BLOOD: LIPASE: 43 U/L (ref 11–51)

## 2018-06-17 NOTE — ED Provider Notes (Signed)
[facility]    CSN: [id number] Arrival date & time: 06/17/18  1153     History   Chief Complaint Chief Complaint  Patient presents with  . Appointment    1200  . Abdominal Pain    HPI Diane Sawyer is a 72 y.o. female history of hypertension, chronic back pain, GERD presenting today for evaluation of right upper quadrant pain.  Patient states that yesterday the pain woke up from sleep yesterday around 3 AM.  Pain is described as a sharp sensation.  Worse with movement or deep breathing.  Has persisted until today.  Denies associated nausea or vomiting.  Denies diarrhea or changes in bowel movements.  Eating and drinking like normal without worsening of pain.  Denies being sick recently or shortness of breath/chest pain.  Has had a occasional persistent dry cough.  Denies fevers.  Last night she applied a pain cream as well as took 2 Tylenol and Robaxin which helped her symptoms and help her sleep.  She denies any injury.  States that she will occasionally do garden work, but has not fallen.  Worse when she lies on her right side.  HPI  Past Medical History:  Diagnosis Date  . Allergic rhinitis   . Anemia   . Anxiety   . Back pain   . Diverticulosis   . GERD (gastroesophageal reflux disease)   . Glaucoma   . Hypertension   . Lumbar compression fracture ([place]) 09/07/2016   x 2   . Menopause   . Vitamin D deficiency     Patient Active Problem List   Diagnosis Date Noted  . Prediabetes 01/08/2018  . Other fatigue 12/25/2017  . Shortness of breath on exertion 12/25/2017  . Hyperglycemia 12/25/2017  . Class 1 obesity with serious comorbidity and body mass index (BMI) of 34.0 to 34.9 in adult 12/25/2017  . Low back pain 11/21/2016  . Rash and nonspecific skin eruption 11/10/2014  . Left shoulder pain 11/18/2013  . Nonspecific abnormal electrocardiogram (ECG) (EKG) 11/18/2013  . Dizziness 11/18/2013  . Well adult exam 11/12/2012  . Anxiety 11/12/2012  .  Hypertension 11/07/2011  . Insomnia 11/07/2011  . PHARYNGITIS 08/31/2009  . Vitamin D deficiency 09/01/2008  . ANEMIA-IRON DEFICIENCY 09/01/2008  . ALLERGIC RHINITIS 09/01/2008  . GERD 09/01/2008  . ADENOMATOUS COLONIC POLYP 12/05/2007  . HEMORRHOIDS, INTERNAL 12/05/2007  . Hiatal hernia 12/05/2007  . DIVERTICULOSIS, COLON 12/05/2007  . Osteopenia 12/05/2007    Past Surgical History:  Procedure Laterality Date  . CATARACT EXTRACTION, BILATERAL  04/06/2017   with cypass stent implanted  . NO PAST SURGERIES      OB History    Gravida  2   Para      Term      Preterm      AB      Living  2     SAB      TAB      Ectopic      Multiple      Live Births               Home Medications    Prior to Admission medications   Medication Sig Start Date End Date Taking? Authorizing Provider  acetaminophen (TYLENOL) 500 MG tablet Take 1,000 mg by mouth every 6 (six) hours as needed for mild pain.    [provider]  ALPRAZolam Duanne Moron) 0.25 MG tablet Take 1 tablet (0.25 mg total) by mouth 2 (two) times daily as  needed for anxiety. 12/11/17   Plotnikov, Evie Lacks, MD  amLODipine (NORVASC) 5 MG tablet TAKE 1 TABLET (5 MG TOTAL) BY MOUTH DAILY. TAKE 1 TABLET EVERY DAY 05/01/18   Plotnikov, Evie Lacks, MD  Cholecalciferol 1000 units capsule Take 2 capsules (2,000 Units total) by mouth daily. 12/11/17   Plotnikov, Evie Lacks, MD  clobetasol cream (TEMOVATE) 2.29 % Apply 1 application topically daily as needed (dermatosis). Reported on 05/23/2016 02/16/15   [provider]  etodolac (LODINE) 500 MG tablet Take 500 mg by mouth daily.    [provider]  fexofenadine (ALLEGRA) 180 MG tablet Take 180 mg by mouth daily.      [provider]  IRON PO Take 65 mg by mouth daily.     [provider]  methocarbamol (ROBAXIN) 500 MG tablet Take 1 tablet (500 mg total) by mouth every 8 (eight) hours as needed for muscle spasms. 06/12/17   Plotnikov,  Evie Lacks, MD  pantoprazole (PROTONIX) 40 MG tablet TAKE 1 TABLET BY MOUTH EVERY DAY 02/02/18   Plotnikov, Evie Lacks, MD  timolol (BETIMOL) 0.25 % ophthalmic solution Place 1 drop into the right eye daily.     [provider]    Family History Family History  Problem Relation Age of Onset  . Stroke Mother 41  . Hypertension Mother   . Sudden death Mother   . Anxiety disorder Mother   . Lung cancer Father 69  . Colon cancer Neg Hx     Social History Social History   Tobacco Use  . Smoking status: Never Smoker  . Smokeless tobacco: Never Used  Substance Use Topics  . Alcohol use: No  . Drug use: No     Allergies   Benazepril hcl; Levofloxacin; and Mobic [meloxicam]   Review of Systems Review of Systems  Constitutional: Negative for activity change, appetite change, fatigue and fever.  Respiratory: Positive for cough. Negative for shortness of breath and wheezing.   Cardiovascular: Negative for chest pain.  Gastrointestinal: Positive for abdominal pain. Negative for diarrhea, nausea and vomiting.  Genitourinary: Negative for dysuria, flank pain, frequency and hematuria.  Musculoskeletal: Negative for back pain and myalgias.  Skin: Negative for rash.  Neurological: Negative for dizziness, light-headedness and headaches.     Physical Exam Triage Vital Signs ED Triage Vitals  Enc Vitals Group     BP      Pulse      Resp      Temp      Temp src      SpO2      Weight      Height      Head Circumference      Peak Flow      Pain Score      Pain Loc      Pain Edu?      Excl. in [place]?    No data found.  Updated Vital Signs There were no vitals taken for this visit.  Visual Acuity Right Eye Distance:   Left Eye Distance:   Bilateral Distance:    Right Eye Near:   Left Eye Near:    Bilateral Near:     Physical Exam  Constitutional: She appears well-developed and well-nourished. No distress.  HENT:  Head: Normocephalic and atraumatic.    Mouth/Throat: Oropharynx is clear and moist.  Eyes: Pupils are equal, round, and reactive to light. Conjunctivae and EOM are normal.  Neck: Neck supple.  Cardiovascular: Normal rate and regular rhythm.  No murmur heard. Pulmonary/Chest: Effort normal and breath sounds normal. No respiratory distress.  Occasional faint wheezing in bilateral upper lung fields  Abdominal: Soft. There is tenderness.  Tenderness to right upper quadrant, positive Murphy's, negative McBurney's, negative Rovsing's.  Pain elicited when pain going from sitting to supine position in supine back to sitting.  Musculoskeletal: She exhibits no edema.  Neurological: She is alert.  Skin: Skin is warm and dry.  Psychiatric: She has a normal mood and affect.  Nursing note and vitals reviewed.    UC Treatments / Results  Labs (all labs ordered are listed, but only abnormal results are displayed) Labs Reviewed  CBC  COMPREHENSIVE METABOLIC PANEL  LIPASE, BLOOD    EKG None  Radiology No results found.  Procedures Procedures (including critical care time)  Medications Ordered in UC Medications - No data to display  Initial Impression / Assessment and Plan / UC Course  I have reviewed the triage vital signs and the nursing notes.  Pertinent labs & imaging results that were available during my care of the patient were reviewed by me and considered in my medical decision making (see chart for details).     Chest x-ray negative, LFTs without abnormality, lipase normal.  Pain seems more muscular versus intra-abdominal.  Will recommend to continue anti-inflammatories.Discussed strict return precautions. Patient verbalized understanding and is agreeable with plan.  Final Clinical Impressions(s) / UC Diagnoses   Final diagnoses:  None     Discharge Instructions     Lab work will come back in a few hours, we will call if abnormal  Please take anti-inflammatories/pain relief cream or robaxin to help with  pain/discomfort'  Monitor symptoms- if developing worsening symptoms, worsening pain, fever, nausea, vomiting, chest pain please go to emergency room   ED Prescriptions    None     Controlled Substance Prescriptions [place] Controlled Substance Registry consulted? Not Applicable   Janith Lima, [place] 06/17/18 1401

## 2018-06-17 NOTE — Discharge Instructions (Addendum)
Lab work will come back in a few hours, we will call if abnormal  Please take anti-inflammatories/pain relief cream or robaxin to help with pain/discomfort to treat muscular cause of symptoms  Monitor symptoms- if developing worsening symptoms, worsening pain, fever, nausea, vomiting, chest pain please go to emergency room

## 2018-06-17 NOTE — ED Triage Notes (Signed)
Pt c/o RUQ abdominal pain and tenderness since yesterday. Pt states it feels like she stretched something.

## 2018-06-20 ENCOUNTER — Other Ambulatory Visit (INDEPENDENT_AMBULATORY_CARE_PROVIDER_SITE_OTHER): Payer: Self-pay | Admitting: Family Medicine

## 2018-06-20 ENCOUNTER — Ambulatory Visit (INDEPENDENT_AMBULATORY_CARE_PROVIDER_SITE_OTHER): Payer: Medicare Other | Admitting: Family Medicine

## 2018-06-20 VITALS — BP 120/78 | HR 66 | Temp 97.9°F | Ht 63.0 in | Wt 179.0 lb

## 2018-06-20 DIAGNOSIS — I10 Essential (primary) hypertension: Secondary | ICD-10-CM

## 2018-06-20 DIAGNOSIS — E669 Obesity, unspecified: Secondary | ICD-10-CM

## 2018-06-20 DIAGNOSIS — Z6831 Body mass index (BMI) 31.0-31.9, adult: Secondary | ICD-10-CM

## 2018-06-20 DIAGNOSIS — R7303 Prediabetes: Secondary | ICD-10-CM | POA: Diagnosis not present

## 2018-06-20 DIAGNOSIS — E559 Vitamin D deficiency, unspecified: Secondary | ICD-10-CM | POA: Diagnosis not present

## 2018-06-20 NOTE — Progress Notes (Signed)
Office: [phone]  /  Fax: [phone]   HPI:   Chief Complaint: OBESITY Diane Sawyer is here to discuss her progress with her obesity treatment plan. She is on the Category 2 plan and is following her eating plan approximately 80 % of the time. She states she is doing yard work 60 minutes 2 times per week. Diane Sawyer has been doing lots of yard work as her activity. She is still sticking to the plan most of the time. Her weight is 179 lb (81.2 kg) today and has had a weight loss of 2 pounds over a period of 3 weeks since her last visit. She has lost 17 lbs since starting treatment with [place].  Vitamin D deficiency Diane Sawyer has a diagnosis of vitamin D deficiency. She is currently taking OTC vit D. [name] admits fatigue and denies nausea, vomiting or muscle weakness.  Hypertension Diane Sawyer is a 72 y.o. female with hypertension.  Diane Sawyer denies dizziness, lightheadedness or leg swelling. She is working weight loss to help control her blood pressure with the goal of decreasing her risk of heart attack and stroke. Diane Sawyer blood pressure is controlled today on 5 mg Amlodipine.Diane Sawyer Diane Sawyer has a diagnosis of prediabetes based on her elevated Hgb A1c and was informed this puts her at greater risk of developing diabetes. She is not taking metformin currently and continues to work on diet and exercise to decrease risk of diabetes. She admits to some starchy carb cravings and denies nausea or hypoglycemia.  ALLERGIES: Allergies  Allergen Reactions  . Benazepril Hcl     REACTION: achy- makes her feel strange.  . Levofloxacin Nausea Only  . Mobic [Meloxicam] Other (See Comments)    Felt like chest going to explode    MEDICATIONS: Current Outpatient Medications on File Prior to Visit  Medication Sig Dispense Refill  . acetaminophen (TYLENOL) 500 MG tablet Take 1,000 mg by mouth every 6 (six) hours as needed for mild pain.    [name] ALPRAZolam (XANAX) 0.25 MG tablet Take 1 tablet (0.25 mg total)  by mouth 2 (two) times daily as needed for anxiety. 60 tablet 3  . amLODipine (NORVASC) 5 MG tablet TAKE 1 TABLET (5 MG TOTAL) BY MOUTH DAILY. TAKE 1 TABLET EVERY DAY 90 tablet 1  . Cholecalciferol 1000 units capsule Take 2 capsules (2,000 Units total) by mouth daily. 100 capsule 11  . clobetasol cream (TEMOVATE) 7.12 % Apply 1 application topically daily as needed (dermatosis). Reported on 05/23/2016    . etodolac (LODINE) 500 MG tablet Take 500 mg by mouth daily.    . fexofenadine (ALLEGRA) 180 MG tablet Take 180 mg by mouth daily.      . IRON PO Take 65 mg by mouth daily.     . methocarbamol (ROBAXIN) 500 MG tablet Take 1 tablet (500 mg total) by mouth every 8 (eight) hours as needed for muscle spasms. 60 tablet 1  . pantoprazole (PROTONIX) 40 MG tablet TAKE 1 TABLET BY MOUTH EVERY DAY 90 tablet 1  . timolol (BETIMOL) 0.25 % ophthalmic solution Place 1 drop into the right eye daily.      No current facility-administered medications on file prior to visit.     PAST MEDICAL HISTORY: Past Medical History:  Diagnosis Date  . Allergic rhinitis   . Anemia   . Anxiety   . Back pain   . Diverticulosis   . GERD (gastroesophageal reflux disease)   . Glaucoma   . Hypertension   .  Lumbar compression fracture ([place]) 09/07/2016   x 2   . Menopause   . Vitamin D deficiency     PAST SURGICAL HISTORY: Past Surgical History:  Procedure Laterality Date  . CATARACT EXTRACTION, BILATERAL  04/06/2017   with cypass stent implanted  . NO PAST SURGERIES      SOCIAL HISTORY: Social History   Tobacco Use  . Smoking status: Never Smoker  . Smokeless tobacco: Never Used  Substance Use Topics  . Alcohol use: No  . Drug use: No    FAMILY HISTORY: Family History  Problem Relation Age of Onset  . Stroke Mother 65  . Hypertension Mother   . Sudden death Mother   . Anxiety disorder Mother   . Lung cancer Father 74  . Colon cancer Neg Hx     ROS: Review of Systems  Constitutional:  Positive for malaise/fatigue and weight loss.  Cardiovascular: Negative for leg swelling.  Gastrointestinal: Negative for nausea and vomiting.  Musculoskeletal:       Negative for muscle weakness  Neurological: Negative for dizziness.       Negative for lightheadedness  Endo/Heme/Allergies:       Negative for hypoglycemia Positive for starchy carb cravings    PHYSICAL EXAM: Blood pressure 120/78, pulse 66, temperature 97.9 F (36.6 C), temperature source Oral, height 5\' 3"  (1.6 m), weight 179 lb (81.2 kg), SpO2 99 %. Body mass index is 31.71 kg/m. Physical Exam  Constitutional: She is oriented to person, place, and time. She appears well-developed and well-nourished.  Cardiovascular: Normal rate.  Pulmonary/Chest: Effort normal.  Musculoskeletal: Normal range of motion.  Neurological: She is oriented to person, place, and time.  Skin: Skin is warm and dry.  Psychiatric: She has a normal mood and affect. Her behavior is normal.  Vitals reviewed.   RECENT LABS AND TESTS: BMET    Component Value Date/Time   NA 140 06/17/2018 1251   NA 142 12/25/2017 1021   K 4.6 06/17/2018 1251   CL 108 06/17/2018 1251   CO2 26 06/17/2018 1251   GLUCOSE 107 (H) 06/17/2018 1251   BUN 14 06/17/2018 1251   BUN 12 12/25/2017 1021   CREATININE 0.70 06/17/2018 1251   CALCIUM 9.0 06/17/2018 1251   GFRNONAA >60 06/17/2018 1251   GFRAA >60 06/17/2018 1251   Lab Results  Component Value Date   HGBA1C 5.8 (H) 12/25/2017   Lab Results  Component Value Date   INSULIN 9.0 12/25/2017   CBC    Component Value Date/Time   WBC 7.3 06/17/2018 1251   RBC 4.40 06/17/2018 1251   HGB 13.2 06/17/2018 1251   HGB 14.0 12/25/2017 1021   HCT 42.0 06/17/2018 1251   HCT 41.5 12/25/2017 1021   PLT 210 06/17/2018 1251   MCV 95.5 06/17/2018 1251   MCV 88 12/25/2017 1021   MCH 30.0 06/17/2018 1251   MCHC 31.4 06/17/2018 1251   RDW 12.9 06/17/2018 1251   RDW 13.7 12/25/2017 1021   LYMPHSABS 1.4  12/25/2017 1021   MONOABS 0.5 06/12/2017 1111   EOSABS 0.1 12/25/2017 1021   BASOSABS 0.0 12/25/2017 1021   Iron/TIBC/Ferritin/ %Sat    Component Value Date/Time   IRON 82 05/23/2016 1005   IRONPCTSAT 24.9 05/23/2016 1005   Lipid Panel     Component Value Date/Time   CHOL 191 12/25/2017 1021   TRIG 114 12/25/2017 1021   HDL 61 12/25/2017 1021   CHOLHDL 3 06/12/2017 1111   VLDL 23.6 06/12/2017 1111  LDLCALC 107 (H) 12/25/2017 1021   Hepatic Function Panel     Component Value Date/Time   PROT 6.7 06/17/2018 1251   PROT 7.0 12/25/2017 1021   ALBUMIN 3.7 06/17/2018 1251   ALBUMIN 4.5 12/25/2017 1021   AST 17 06/17/2018 1251   ALT 12 06/17/2018 1251   ALKPHOS 45 06/17/2018 1251   BILITOT 0.5 06/17/2018 1251   BILITOT 0.3 12/25/2017 1021   BILIDIR 0.0 06/12/2017 1111      Component Value Date/Time   TSH 1.270 12/25/2017 1021   TSH 0.75 06/12/2017 1111   TSH 0.87 11/09/2015 0940   Results for JACKELYN, ILLINGWORTH (MRN [id number]) as of 06/20/2018 09:39  Ref. Range 12/25/2017 10:21  Vitamin D, 25-Hydroxy Latest Ref Range: 30.0 - 100.0 ng/mL 29.2 (L)   ASSESSMENT AND PLAN: Essential hypertension  Vitamin D deficiency - Plan: VITAMIN D 25 Hydroxy (Vit-D Deficiency, Fractures)  Prediabetes - Plan: Hemoglobin A1c, Lipid Panel With LDL/HDL Ratio  Class 1 obesity with serious comorbidity and body mass index (BMI) of 31.0 to 31.9 in adult, unspecified obesity type  PLAN:  Vitamin D Deficiency Diane Sawyer was informed that low vitamin D levels contributes to fatigue and are associated with obesity, breast, and colon cancer. She will continue to take OTC Vit D @2 ,000 IU every day. We will check vitamin D level today and she will follow up for routine testing of vitamin D, at least 2-3 times per year. She was informed of the risk of over-replacement of vitamin D and agrees to not increase her dose unless she discusses this with [place] first.  Hypertension We discussed sodium restriction,  working on healthy weight loss, and a regular exercise program as the means to achieve improved blood pressure control. Diane Sawyer agreed with this plan and agreed to follow up as directed. We will check FLP today and will continue to monitor her blood pressure as well as her progress with the above lifestyle modifications. She will continue Amlodipine as prescribed and will watch for signs of hypotension as she continues her lifestyle modifications.  Pre-Diabetes Diane Sawyer will continue to work on weight loss, exercise, and decreasing simple carbohydrates in her diet to help decrease the risk of diabetes. She was informed that eating too many simple carbohydrates or too many calories at one sitting increases the likelihood of GI side effects. We will check Hgb A1c today and Heidie agreed to follow up with [place] as directed to monitor her progress.  Obesity Diane Sawyer is currently in the action stage of change. As such, her goal is to continue with weight loss efforts She has agreed to follow the Category 2 plan Diane Sawyer has been instructed to work up to a goal of 150 minutes of combined cardio and strengthening exercise per week for weight loss and overall health benefits. We discussed the following Behavioral Modification Strategies today: better snacking choices, planning for success, increasing lean protein intake, increasing vegetables and work on meal planning and easy cooking plans  Diane Sawyer has agreed to follow up with our clinic in 2 weeks. She was informed of the importance of frequent follow up visits to maximize her success with intensive lifestyle modifications for her multiple health conditions.   OBESITY BEHAVIORAL INTERVENTION VISIT  Today's visit was # 10 out of 22.  Starting weight: 196 lbs Starting date: 12/25/17 Today's weight : 179 lbs  Today's date: 06/20/2018 Total lbs lost to date: 58    ASK: We discussed the diagnosis of obesity with Diane Sawyer today and  Diane Sawyer agreed to give [place]  permission to discuss obesity behavioral modification therapy today.  ASSESS: Diane Sawyer has the diagnosis of obesity and her BMI today is 31.72 Diane Sawyer is in the action stage of change   ADVISE: Diane Sawyer was educated on the multiple health risks of obesity as well as the benefit of weight loss to improve her health. She was advised of the need for long term treatment and the importance of lifestyle modifications.  AGREE: Multiple dietary modification options and treatment options were discussed and  Diane Sawyer agreed to the above obesity treatment plan.  I, Doreene Nest, am acting as transcriptionist for Eber Jones, MD   .I have reviewed the above documentation for accuracy and completeness, and I agree with the above. - Ilene Qua, MD

## 2018-06-21 LAB — HEMOGLOBIN A1C
ESTIMATED AVERAGE GLUCOSE: 120 mg/dL
HEMOGLOBIN A1C: 5.8 % — AB (ref 4.8–5.6)

## 2018-06-21 LAB — VITAMIN D 25 HYDROXY (VIT D DEFICIENCY, FRACTURES): Vit D, 25-Hydroxy: 35.3 ng/mL (ref 30.0–100.0)

## 2018-06-21 LAB — LIPID PANEL WITH LDL/HDL RATIO
Cholesterol, Total: 157 mg/dL (ref 100–199)
HDL: 58 mg/dL (ref >39)
LDL CALC: 83 mg/dL (ref 0–99)
LDl/HDL Ratio: 1.4 ratio (ref 0.0–3.2)
Triglycerides: 81 mg/dL (ref 0–149)
VLDL CHOLESTEROL CAL: 16 mg/dL (ref 5–40)

## 2018-07-02 ENCOUNTER — Encounter: Payer: Self-pay | Admitting: Internal Medicine

## 2018-07-02 ENCOUNTER — Ambulatory Visit (INDEPENDENT_AMBULATORY_CARE_PROVIDER_SITE_OTHER): Payer: Medicare Other | Admitting: Internal Medicine

## 2018-07-02 DIAGNOSIS — R7303 Prediabetes: Secondary | ICD-10-CM | POA: Diagnosis not present

## 2018-07-02 DIAGNOSIS — M8580 Other specified disorders of bone density and structure, unspecified site: Secondary | ICD-10-CM | POA: Diagnosis not present

## 2018-07-02 DIAGNOSIS — E559 Vitamin D deficiency, unspecified: Secondary | ICD-10-CM

## 2018-07-02 DIAGNOSIS — I10 Essential (primary) hypertension: Secondary | ICD-10-CM

## 2018-07-02 MED ORDER — DENOSUMAB 60 MG/ML ~~LOC~~ SOSY
60.0000 mg | PREFILLED_SYRINGE | Freq: Once | SUBCUTANEOUS | Status: AC
  Administered 2018-07-02: 60 mg via SUBCUTANEOUS

## 2018-07-02 NOTE — Patient Instructions (Signed)
[facility] well w/Jill 

## 2018-07-02 NOTE — Progress Notes (Signed)
Subjective:  Patient ID: Diane Sawyer, female    DOB: January 31, 1946  Age: 72 y.o. MRN: [id number]  CC: No chief complaint on file.   HPI JEVON LITTLEPAGE presents for HTN, anxiety, GERD f/u F/u osteoporosis  Outpatient Medications Prior to Visit  Medication Sig Dispense Refill  . acetaminophen (TYLENOL) 500 MG tablet Take 1,000 mg by mouth every 6 (six) hours as needed for mild pain.    [name] ALPRAZolam (XANAX) 0.25 MG tablet Take 1 tablet (0.25 mg total) by mouth 2 (two) times daily as needed for anxiety. 60 tablet 3  . amLODipine (NORVASC) 5 MG tablet TAKE 1 TABLET (5 MG TOTAL) BY MOUTH DAILY. TAKE 1 TABLET EVERY DAY 90 tablet 1  . Cholecalciferol 1000 units capsule Take 2 capsules (2,000 Units total) by mouth daily. 100 capsule 11  . clobetasol cream (TEMOVATE) 7.89 % Apply 1 application topically daily as needed (dermatosis). Reported on 05/23/2016    . etodolac (LODINE) 500 MG tablet Take 500 mg by mouth daily.    . fexofenadine (ALLEGRA) 180 MG tablet Take 180 mg by mouth daily.      . IRON PO Take 65 mg by mouth daily.     . methocarbamol (ROBAXIN) 500 MG tablet Take 1 tablet (500 mg total) by mouth every 8 (eight) hours as needed for muscle spasms. 60 tablet 1  . pantoprazole (PROTONIX) 40 MG tablet TAKE 1 TABLET BY MOUTH EVERY DAY 90 tablet 1  . timolol (BETIMOL) 0.25 % ophthalmic solution Place 1 drop into the right eye daily.      No facility-administered medications prior to visit.     ROS: Review of Systems  Constitutional: Negative for activity change, appetite change, chills, fatigue and unexpected weight change.  HENT: Negative for congestion, mouth sores and sinus pressure.   Eyes: Negative for visual disturbance.  Respiratory: Negative for cough and chest tightness.   Gastrointestinal: Negative for abdominal pain and nausea.  Genitourinary: Negative for difficulty urinating, frequency and vaginal pain.  Musculoskeletal: Positive for arthralgias. Negative for back pain and  gait problem.  Skin: Negative for pallor and rash.  Neurological: Negative for dizziness, tremors, weakness, numbness and headaches.  Psychiatric/Behavioral: Negative for confusion and sleep disturbance.    Objective:  BP 122/76 (BP Location: Left Arm, Patient Position: Sitting, Cuff Size: Large)   Pulse 68   Temp 98.2 F (36.8 C) (Oral)   Ht 5\' 3"  (1.6 m)   Wt 183 lb (83 kg)   SpO2 98%   BMI 32.42 kg/m   BP Readings from Last 3 Encounters:  07/02/18 122/76  06/20/18 120/78  05/30/18 122/77    Wt Readings from Last 3 Encounters:  07/02/18 183 lb (83 kg)  06/20/18 179 lb (81.2 kg)  05/30/18 181 lb (82.1 kg)    Physical Exam  Constitutional: She appears well-developed. No distress.  HENT:  Head: Normocephalic.  Right Ear: External ear normal.  Left Ear: External ear normal.  Nose: Nose normal.  Mouth/Throat: Oropharynx is clear and moist.  Eyes: Pupils are equal, round, and reactive to light. Conjunctivae are normal. Right eye exhibits no discharge. Left eye exhibits no discharge.  Neck: Normal range of motion. Neck supple. No JVD present. No tracheal deviation present. No thyromegaly present.  Cardiovascular: Normal rate, regular rhythm and normal heart sounds.  Pulmonary/Chest: No stridor. No respiratory distress. She has no wheezes.  Abdominal: Soft. Bowel sounds are normal. She exhibits no distension and no mass. There is no tenderness. There  is no rebound and no guarding.  Musculoskeletal: She exhibits no edema or tenderness.  Lymphadenopathy:    She has no cervical adenopathy.  Neurological: She displays normal reflexes. No cranial nerve deficit. She exhibits normal muscle tone. Coordination normal.  Skin: No rash noted. No erythema.  Psychiatric: She has a normal mood and affect. Her behavior is normal. Judgment and thought content normal.    Lab Results  Component Value Date   WBC 7.3 06/17/2018   HGB 13.2 06/17/2018   HCT 42.0 06/17/2018   PLT 210  06/17/2018   GLUCOSE 107 (H) 06/17/2018   CHOL 157 06/20/2018   TRIG 81 06/20/2018   HDL 58 06/20/2018   LDLCALC 83 06/20/2018   ALT 12 06/17/2018   AST 17 06/17/2018   NA 140 06/17/2018   K 4.6 06/17/2018   CL 108 06/17/2018   CREATININE 0.70 06/17/2018   BUN 14 06/17/2018   CO2 26 06/17/2018   TSH 1.270 12/25/2017   HGBA1C 5.8 (H) 06/20/2018    Dg Chest 2 View  Result Date: 06/17/2018 CLINICAL DATA:  Patient c/o cough x 2 weeks, some wheezing, and RUQ pain. HX of htn. Non-smoker. EXAM: CHEST - 2 VIEW COMPARISON:  10/01/2016 FINDINGS: Heart size is normal. There is minimal bibasilar atelectasis. No focal consolidations or pulmonary edema. Moderate hiatal hernia is again noted. IMPRESSION: No evidence for acute  abnormality.  Hiatal hernia. Electronically Signed   By: Nolon Nations M.D.   On: 06/17/2018 13:27    Assessment & Plan:   There are no diagnoses linked to this encounter.   No orders of the defined types were placed in this encounter.    Follow-up: No follow-ups on file.  Walker Kehr, MD

## 2018-07-02 NOTE — Assessment & Plan Note (Signed)
Amlodipine.

## 2018-07-02 NOTE — Assessment & Plan Note (Signed)
Vit D 

## 2018-07-02 NOTE — Assessment & Plan Note (Signed)
Prolia

## 2018-07-02 NOTE — Assessment & Plan Note (Signed)
Labs

## 2018-07-11 ENCOUNTER — Ambulatory Visit (INDEPENDENT_AMBULATORY_CARE_PROVIDER_SITE_OTHER): Payer: Medicare Other | Admitting: Family Medicine

## 2018-07-11 VITALS — BP 129/84 | HR 74 | Temp 97.7°F | Ht 63.0 in | Wt 182.0 lb

## 2018-07-11 DIAGNOSIS — E669 Obesity, unspecified: Secondary | ICD-10-CM

## 2018-07-11 DIAGNOSIS — I10 Essential (primary) hypertension: Secondary | ICD-10-CM | POA: Diagnosis not present

## 2018-07-11 DIAGNOSIS — E559 Vitamin D deficiency, unspecified: Secondary | ICD-10-CM | POA: Diagnosis not present

## 2018-07-11 DIAGNOSIS — Z6832 Body mass index (BMI) 32.0-32.9, adult: Secondary | ICD-10-CM

## 2018-07-12 NOTE — Progress Notes (Signed)
Office: [phone]  /  Fax: [phone]   HPI:   Chief Complaint: OBESITY Diane Sawyer is here to discuss her progress with her obesity treatment plan. She is on the Category 2 plan and is following her eating plan approximately 80 % of the time. She states she is gardening for 30 minutes 3 times per week. Geralyn had a birthday last week and celebrated with co-workers and at [employer] with her children. Next weekend traveling but plans to stay on the plan.  Her weight is 182 lb (82.6 kg) today and has gained 3 pounds since her last visit. She has lost 14 lbs since starting treatment with [place].  Hypertension Diane Sawyer is a 72 y.o. female with hypertension. Diane Sawyer's blood pressure is controlled today. She denies chest pain, chest pressure, or headache. She is working weight loss to help control her blood pressure with the goal of decreasing her risk of heart attack and stroke.   Vitamin D Deficiency Diane Sawyer has a diagnosis of vitamin D deficiency. She is on OTC Vit D supplement and denies nausea, vomiting or muscle weakness.  ALLERGIES: Allergies  Allergen Reactions  . Benazepril Hcl     REACTION: achy- makes her feel strange.  . Levofloxacin Nausea Only  . Mobic [Meloxicam] Other (See Comments)    Felt like chest going to explode    MEDICATIONS: Current Outpatient Medications on File Prior to Visit  Medication Sig Dispense Refill  . acetaminophen (TYLENOL) 500 MG tablet Take 1,000 mg by mouth every 6 (six) hours as needed for mild pain.    [name] ALPRAZolam (XANAX) 0.25 MG tablet Take 1 tablet (0.25 mg total) by mouth 2 (two) times daily as needed for anxiety. 60 tablet 3  . amLODipine (NORVASC) 5 MG tablet TAKE 1 TABLET (5 MG TOTAL) BY MOUTH DAILY. TAKE 1 TABLET EVERY DAY 90 tablet 1  . Cholecalciferol 1000 units capsule Take 2 capsules (2,000 Units total) by mouth daily. 100 capsule 11  . clobetasol cream (TEMOVATE) 8.93 % Apply 1 application topically daily as needed (dermatosis). Reported  on 05/23/2016    . etodolac (LODINE) 500 MG tablet Take 500 mg by mouth daily.    . fexofenadine (ALLEGRA) 180 MG tablet Take 180 mg by mouth daily.      . IRON PO Take 65 mg by mouth daily.     . methocarbamol (ROBAXIN) 500 MG tablet Take 1 tablet (500 mg total) by mouth every 8 (eight) hours as needed for muscle spasms. 60 tablet 1  . pantoprazole (PROTONIX) 40 MG tablet TAKE 1 TABLET BY MOUTH EVERY DAY 90 tablet 1  . timolol (BETIMOL) 0.25 % ophthalmic solution Place 1 drop into the right eye daily.      No current facility-administered medications on file prior to visit.     PAST MEDICAL HISTORY: Past Medical History:  Diagnosis Date  . Allergic rhinitis   . Anemia   . Anxiety   . Back pain   . Diverticulosis   . GERD (gastroesophageal reflux disease)   . Glaucoma   . Hypertension   . Lumbar compression fracture ([place]) 09/07/2016   x 2   . Menopause   . Vitamin D deficiency     PAST SURGICAL HISTORY: Past Surgical History:  Procedure Laterality Date  . CATARACT EXTRACTION, BILATERAL  04/06/2017   with cypass stent implanted  . NO PAST SURGERIES      SOCIAL HISTORY: Social History   Tobacco Use  . Smoking status: Never  Smoker  . Smokeless tobacco: Never Used  Substance Use Topics  . Alcohol use: No  . Drug use: No    FAMILY HISTORY: Family History  Problem Relation Age of Onset  . Stroke Mother 62  . Hypertension Mother   . Sudden death Mother   . Anxiety disorder Mother   . Lung cancer Father 28  . Colon cancer Neg Hx     ROS: Review of Systems  Constitutional: Negative for weight loss.  Cardiovascular: Negative for chest pain.       Negative chest pressure  Gastrointestinal: Negative for nausea and vomiting.  Musculoskeletal:       Negative muscle weakness  Neurological: Negative for headaches.    PHYSICAL EXAM: Blood pressure 129/84, pulse 74, temperature 97.7 F (36.5 C), temperature source Oral, height 5\' 3"  (1.6 m), weight 182 lb (82.6  kg), SpO2 99 %. Body mass index is 32.24 kg/m. Physical Exam  Constitutional: She is oriented to person, place, and time. She appears well-developed and well-nourished.  Cardiovascular: Normal rate.  Pulmonary/Chest: Effort normal.  Musculoskeletal: Normal range of motion.  Neurological: She is oriented to person, place, and time.  Skin: Skin is warm and dry.  Psychiatric: She has a normal mood and affect. Her behavior is normal.  Vitals reviewed.   RECENT LABS AND TESTS: BMET    Component Value Date/Time   NA 140 06/17/2018 1251   NA 142 12/25/2017 1021   K 4.6 06/17/2018 1251   CL 108 06/17/2018 1251   CO2 26 06/17/2018 1251   GLUCOSE 107 (H) 06/17/2018 1251   BUN 14 06/17/2018 1251   BUN 12 12/25/2017 1021   CREATININE 0.70 06/17/2018 1251   CALCIUM 9.0 06/17/2018 1251   GFRNONAA >60 06/17/2018 1251   GFRAA >60 06/17/2018 1251   Lab Results  Component Value Date   HGBA1C 5.8 (H) 06/20/2018   HGBA1C 5.8 (H) 12/25/2017   Lab Results  Component Value Date   INSULIN 9.0 12/25/2017   CBC    Component Value Date/Time   WBC 7.3 06/17/2018 1251   RBC 4.40 06/17/2018 1251   HGB 13.2 06/17/2018 1251   HGB 14.0 12/25/2017 1021   HCT 42.0 06/17/2018 1251   HCT 41.5 12/25/2017 1021   PLT 210 06/17/2018 1251   MCV 95.5 06/17/2018 1251   MCV 88 12/25/2017 1021   MCH 30.0 06/17/2018 1251   MCHC 31.4 06/17/2018 1251   RDW 12.9 06/17/2018 1251   RDW 13.7 12/25/2017 1021   LYMPHSABS 1.4 12/25/2017 1021   MONOABS 0.5 06/12/2017 1111   EOSABS 0.1 12/25/2017 1021   BASOSABS 0.0 12/25/2017 1021   Iron/TIBC/Ferritin/ %Sat    Component Value Date/Time   IRON 82 05/23/2016 1005   IRONPCTSAT 24.9 05/23/2016 1005   Lipid Panel     Component Value Date/Time   CHOL 157 06/20/2018 0859   TRIG 81 06/20/2018 0859   HDL 58 06/20/2018 0859   CHOLHDL 3 06/12/2017 1111   VLDL 23.6 06/12/2017 1111   LDLCALC 83 06/20/2018 0859   Hepatic Function Panel     Component Value  Date/Time   PROT 6.7 06/17/2018 1251   PROT 7.0 12/25/2017 1021   ALBUMIN 3.7 06/17/2018 1251   ALBUMIN 4.5 12/25/2017 1021   AST 17 06/17/2018 1251   ALT 12 06/17/2018 1251   ALKPHOS 45 06/17/2018 1251   BILITOT 0.5 06/17/2018 1251   BILITOT 0.3 12/25/2017 1021   BILIDIR 0.0 06/12/2017 1111      Component  Value Date/Time   TSH 1.270 12/25/2017 1021   TSH 0.75 06/12/2017 1111   TSH 0.87 11/09/2015 0940  Results for JALINA, BLOWERS (MRN [id number]) as of 07/12/2018 13:36  Ref. Range 06/20/2018 09:13  Vitamin D, 25-Hydroxy Latest Ref Range: 30.0 - 100.0 ng/mL 35.3    ASSESSMENT AND PLAN: Essential hypertension  Vitamin D deficiency  Class 1 obesity with serious comorbidity and body mass index (BMI) of 32.0 to 32.9 in adult, unspecified obesity type  PLAN:  Hypertension We discussed sodium restriction, working on healthy weight loss, and a regular exercise program as the means to achieve improved blood pressure control. Diane Sawyer agreed with this plan and agreed to follow up as directed. We will continue to monitor her blood pressure as well as her progress with the above lifestyle modifications. She will continue her current medications and will watch for signs of hypotension as she continues her lifestyle modifications. Diane Sawyer agrees to follow up with our clinic in 2 weeks.  Vitamin D Deficiency Diane Sawyer was informed that low vitamin D levels contributes to fatigue and are associated with obesity, breast, and colon cancer. Diane Sawyer agrees to continue taking current OTC Vit D supplement and will follow up for routine testing of vitamin D, at least 2-3 times per year. She was informed of the risk of over-replacement of vitamin D and agrees to not increase her dose unless she discusses this with [place] first. Diane Sawyer agrees to follow up with our clinic in 2 weeks.  We spent > than 50% of the 15 minute visit on the counseling as documented in the note.  Obesity Diane Sawyer is currently in the action stage of  change. As such, her goal is to continue with weight loss efforts She has agreed to follow the Category 2 plan Diane Sawyer has been instructed to work up to a goal of 150 minutes of combined cardio and strengthening exercise per week for weight loss and overall health benefits. We discussed the following Behavioral Modification Strategies today: increasing lean protein intake, increasing vegetables, work on meal planning and easy cooking plans, travel eating strategies, celebration eating strategies, and planning for success   Adalai has agreed to follow up with our clinic in 2 weeks. She was informed of the importance of frequent follow up visits to maximize her success with intensive lifestyle modifications for her multiple health conditions.   OBESITY BEHAVIORAL INTERVENTION VISIT  Today's visit was # 11 out of 22.  Starting weight: 196 lbs Starting date: 12/25/17 Today's weight : 182 lbs  Today's date: 07/11/2018 Total lbs lost to date: 60    ASK: We discussed the diagnosis of obesity with Gladis Riffle today and Britania agreed to give [place] permission to discuss obesity behavioral modification therapy today.  ASSESS: Sibyl has the diagnosis of obesity and her BMI today is 32.25 Emanii is in the action stage of change   ADVISE: [place] was educated on the multiple health risks of obesity as well as the benefit of weight loss to improve her health. She was advised of the need for long term treatment and the importance of lifestyle modifications.  AGREE: Multiple dietary modification options and treatment options were discussed and  Monita agreed to the above obesity treatment plan.  I, Diane Sawyer, am acting as transcriptionist for Diane Qua, MD  I have reviewed the above documentation for accuracy and completeness, and I agree with the above. - Diane Qua, MD

## 2018-07-30 ENCOUNTER — Ambulatory Visit (INDEPENDENT_AMBULATORY_CARE_PROVIDER_SITE_OTHER): Payer: Medicare Other | Admitting: Family Medicine

## 2018-07-30 ENCOUNTER — Encounter (INDEPENDENT_AMBULATORY_CARE_PROVIDER_SITE_OTHER): Payer: Self-pay

## 2018-07-30 ENCOUNTER — Ambulatory Visit (INDEPENDENT_AMBULATORY_CARE_PROVIDER_SITE_OTHER): Payer: Medicare Other | Admitting: Bariatrics

## 2018-07-31 ENCOUNTER — Ambulatory Visit (INDEPENDENT_AMBULATORY_CARE_PROVIDER_SITE_OTHER): Payer: Medicare Other | Admitting: Bariatrics

## 2018-08-01 ENCOUNTER — Encounter (INDEPENDENT_AMBULATORY_CARE_PROVIDER_SITE_OTHER): Payer: Self-pay | Admitting: Bariatrics

## 2018-08-01 ENCOUNTER — Ambulatory Visit (INDEPENDENT_AMBULATORY_CARE_PROVIDER_SITE_OTHER): Payer: Medicare Other | Admitting: Bariatrics

## 2018-08-01 VITALS — BP 148/82 | HR 76 | Ht 63.0 in | Wt 179.0 lb

## 2018-08-01 DIAGNOSIS — E669 Obesity, unspecified: Secondary | ICD-10-CM | POA: Diagnosis not present

## 2018-08-01 DIAGNOSIS — I10 Essential (primary) hypertension: Secondary | ICD-10-CM

## 2018-08-01 DIAGNOSIS — Z6831 Body mass index (BMI) 31.0-31.9, adult: Secondary | ICD-10-CM | POA: Diagnosis not present

## 2018-08-01 DIAGNOSIS — E559 Vitamin D deficiency, unspecified: Secondary | ICD-10-CM | POA: Diagnosis not present

## 2018-08-01 NOTE — Progress Notes (Signed)
Office: [phone]  /  Fax: [phone]   HPI:   Chief Complaint: OBESITY Diane Sawyer is here to discuss her progress with her obesity treatment plan. She is on the Category 2 plan and is following her eating plan approximately 80 % of the time. She states she is doing yard work for 60 minutes 2 times per week. Diane Sawyer is doing well with Category 2 plan. Her hunger is controlled with no significant cravings. She has decreased carbohydrates and is eating most of her protein. Her goal weight is 160 to 170 pounds.  Her weight is 179 lb (81.2 kg) today and has had a weight loss of 3 pounds over a period of 3 weeks since her last visit. She has lost 17 lbs since starting treatment with [place].  Hypertension Diane Sawyer is a 72 y.o. female with hypertension.  Diane Sawyer denies chest pain,  shortness of breath or hypotension. She is working on weight loss to help control her blood pressure with the goal of decreasing her risk of heart attack and stroke. [name]'s blood pressure is reasonably well controlled while taking Norvasc. She denies any side effects.  Vitamin D deficiency Diane Sawyer has a diagnosis of vitamin D deficiency. She is currently taking vit D and denies nausea, vomiting or muscle aches. Her last vitamin D level was 35.3 on 06/20/18. She does not like to take prescription medications.  ALLERGIES: Allergies  Allergen Reactions  . Benazepril Hcl     REACTION: achy- makes her feel strange.  . Levofloxacin Nausea Only  . Mobic [Meloxicam] Other (See Comments)    Felt like chest going to explode    MEDICATIONS: Current Outpatient Medications on File Prior to Visit  Medication Sig Dispense Refill  . acetaminophen (TYLENOL) 500 MG tablet Take 1,000 mg by mouth every 6 (six) hours as needed for mild pain.    [name] ALPRAZolam (XANAX) 0.25 MG tablet Take 1 tablet (0.25 mg total) by mouth 2 (two) times daily as needed for anxiety. 60 tablet 3  . amLODipine (NORVASC) 5 MG tablet TAKE 1 TABLET (5 MG  TOTAL) BY MOUTH DAILY. TAKE 1 TABLET EVERY DAY 90 tablet 1  . Cholecalciferol 1000 units capsule Take 2 capsules (2,000 Units total) by mouth daily. 100 capsule 11  . clobetasol cream (TEMOVATE) 5.09 % Apply 1 application topically daily as needed (dermatosis). Reported on 05/23/2016    . etodolac (LODINE) 500 MG tablet Take 500 mg by mouth daily.    . fexofenadine (ALLEGRA) 180 MG tablet Take 180 mg by mouth daily.      . IRON PO Take 65 mg by mouth daily.     . methocarbamol (ROBAXIN) 500 MG tablet Take 1 tablet (500 mg total) by mouth every 8 (eight) hours as needed for muscle spasms. 60 tablet 1  . pantoprazole (PROTONIX) 40 MG tablet TAKE 1 TABLET BY MOUTH EVERY DAY 90 tablet 1  . timolol (BETIMOL) 0.25 % ophthalmic solution Place 1 drop into the right eye daily.      No current facility-administered medications on file prior to visit.     PAST MEDICAL HISTORY: Past Medical History:  Diagnosis Date  . Allergic rhinitis   . Anemia   . Anxiety   . Back pain   . Diverticulosis   . GERD (gastroesophageal reflux disease)   . Glaucoma   . Hypertension   . Lumbar compression fracture ([place]) 09/07/2016   x 2   . Menopause   . Vitamin D deficiency  PAST SURGICAL HISTORY: Past Surgical History:  Procedure Laterality Date  . CATARACT EXTRACTION, BILATERAL  04/06/2017   with cypass stent implanted  . NO PAST SURGERIES      SOCIAL HISTORY: Social History   Tobacco Use  . Smoking status: Never Smoker  . Smokeless tobacco: Never Used  Substance Use Topics  . Alcohol use: No  . Drug use: No    FAMILY HISTORY: Family History  Problem Relation Age of Onset  . Stroke Mother 79  . Hypertension Mother   . Sudden death Mother   . Anxiety disorder Mother   . Lung cancer Father 15  . Colon cancer Neg Hx     ROS: Review of Systems  Constitutional: Positive for weight loss.  Respiratory: Negative for shortness of breath.   Cardiovascular: Negative for chest pain.        Negative for hypotension.  Gastrointestinal: Negative for nausea and vomiting.  Musculoskeletal:       Negative for muscle aches.    PHYSICAL EXAM: Blood pressure (!) 148/82, pulse 76, height 5\' 3"  (1.6 m), weight 179 lb (81.2 kg), SpO2 97 %. Body mass index is 31.71 kg/m. Physical Exam  Constitutional: She is oriented to person, place, and time. She appears well-developed and well-nourished.  Cardiovascular: Normal rate.  Pulmonary/Chest: Effort normal.  Musculoskeletal: Normal range of motion.  Neurological: She is oriented to person, place, and time.  Skin: Skin is warm and dry.  Psychiatric: She has a normal mood and affect. Her behavior is normal.    RECENT LABS AND TESTS: BMET    Component Value Date/Time   NA 140 06/17/2018 1251   NA 142 12/25/2017 1021   K 4.6 06/17/2018 1251   CL 108 06/17/2018 1251   CO2 26 06/17/2018 1251   GLUCOSE 107 (H) 06/17/2018 1251   BUN 14 06/17/2018 1251   BUN 12 12/25/2017 1021   CREATININE 0.70 06/17/2018 1251   CALCIUM 9.0 06/17/2018 1251   GFRNONAA >60 06/17/2018 1251   GFRAA >60 06/17/2018 1251   Lab Results  Component Value Date   HGBA1C 5.8 (H) 06/20/2018   HGBA1C 5.8 (H) 12/25/2017   Lab Results  Component Value Date   INSULIN 9.0 12/25/2017   CBC    Component Value Date/Time   WBC 7.3 06/17/2018 1251   RBC 4.40 06/17/2018 1251   HGB 13.2 06/17/2018 1251   HGB 14.0 12/25/2017 1021   HCT 42.0 06/17/2018 1251   HCT 41.5 12/25/2017 1021   PLT 210 06/17/2018 1251   MCV 95.5 06/17/2018 1251   MCV 88 12/25/2017 1021   MCH 30.0 06/17/2018 1251   MCHC 31.4 06/17/2018 1251   RDW 12.9 06/17/2018 1251   RDW 13.7 12/25/2017 1021   LYMPHSABS 1.4 12/25/2017 1021   MONOABS 0.5 06/12/2017 1111   EOSABS 0.1 12/25/2017 1021   BASOSABS 0.0 12/25/2017 1021   Iron/TIBC/Ferritin/ %Sat    Component Value Date/Time   IRON 82 05/23/2016 1005   IRONPCTSAT 24.9 05/23/2016 1005   Lipid Panel     Component Value Date/Time    CHOL 157 06/20/2018 0859   TRIG 81 06/20/2018 0859   HDL 58 06/20/2018 0859   CHOLHDL 3 06/12/2017 1111   VLDL 23.6 06/12/2017 1111   LDLCALC 83 06/20/2018 0859   Hepatic Function Panel     Component Value Date/Time   PROT 6.7 06/17/2018 1251   PROT 7.0 12/25/2017 1021   ALBUMIN 3.7 06/17/2018 1251   ALBUMIN 4.5 12/25/2017 1021  AST 17 06/17/2018 1251   ALT 12 06/17/2018 1251   ALKPHOS 45 06/17/2018 1251   BILITOT 0.5 06/17/2018 1251   BILITOT 0.3 12/25/2017 1021   BILIDIR 0.0 06/12/2017 1111      Component Value Date/Time   TSH 1.270 12/25/2017 1021   TSH 0.75 06/12/2017 1111   TSH 0.87 11/09/2015 0940   Results for DESSIREE, SZE (MRN [id number]) as of 08/01/2018 16:30  Ref. Range 06/20/2018 09:13  Vitamin D, 25-Hydroxy Latest Ref Range: 30.0 - 100.0 ng/mL 35.3   ASSESSMENT AND PLAN: Essential hypertension  Vitamin D deficiency  Class 1 obesity with serious comorbidity and body mass index (BMI) of 31.0 to 31.9 in adult, unspecified obesity type  PLAN:  Hypertension We discussed sodium restriction, working on healthy weight loss, and a regular exercise program as the means to achieve improved blood pressure control. Eithel agreed with this plan and agreed to follow up as directed. We will continue to monitor her blood pressure as well as her progress with the above lifestyle modifications. She will continue her medications as prescribed and will watch for signs of hypotension as she continues her lifestyle modifications.  Vitamin D Deficiency Aastha was informed that low vitamin D levels contributes to fatigue and are associated with obesity, breast, and colon cancer. She agrees to continue to take Vitamin D OTC 2,000 IU daily and will follow up for routine testing of vitamin D, at least 2-3 times per year. She was informed of the risk of over-replacement of vitamin D and agrees to not increase her dose unless she discusses this with [place] first.  Obesity Jennaya is currently in  the action stage of change. As such, her goal is to continue with weight loss efforts. She has agreed to follow the Category 2 plan as closely as possible and will consider journaling in the future. Cheryel has been instructed to work in the yard and gardening for 3 to 4 days a week and work up to a goal of 150 minutes of combined cardio and strengthening exercise per week for weight loss and overall health benefits. We discussed the following Behavioral Modification Strategies today: increasing lean protein intake, decreasing simple carbohydrates, increase H20, no skipping meals, and planning for success.   Maye has agreed to follow up with our clinic in 2 weeks. She was informed of the importance of frequent follow up visits to maximize her success with intensive lifestyle modifications for hermultiple health conditions.   OBESITY BEHAVIORAL INTERVENTION VISIT  Today's visit was # 12  Starting weight: 196 lbs Starting date: 12/25/17 Today's weight :179 lb (81.2 kg)  Today's date: 08/01/2018 Total lbs lost to date: 17 At least 15 minutes were spent on discussing the following behavioral intervention visit.   ASK: We discussed the diagnosis of obesity with Gladis Riffle today and Vaani agreed to give [place] permission to discuss obesity behavioral modification therapy today.  ASSESS: Elainna has the diagnosis of obesity and her BMI today is 31.72 Lucinda is in the action stage of change   ADVISE: Calani was educated on the multiple health risks of obesity as well as the benefit of weight loss to improve her health. She was advised of the need for long term treatment and the importance of lifestyle modifications to improve her current health and to decrease her risk of future health problems.  AGREE: Multiple dietary modification options and treatment options were discussed and  Dayrin agreed to follow the recommendations documented in the above note.  ARRANGE: Sumaiya was educated on the  importance of frequent visits to treat obesity as outlined per CMS and USPSTF guidelines and agreed to schedule her next follow up appointment today.  I, Marcille Blanco, am acting as Location manager for [employer]. Owens Shark, DO  I have reviewed the above documentation for accuracy and completeness, and I agree with the above. -Jearld Lesch, DO

## 2018-08-15 ENCOUNTER — Encounter (INDEPENDENT_AMBULATORY_CARE_PROVIDER_SITE_OTHER): Payer: Self-pay | Admitting: Bariatrics

## 2018-08-15 ENCOUNTER — Ambulatory Visit (INDEPENDENT_AMBULATORY_CARE_PROVIDER_SITE_OTHER): Payer: Medicare Other | Admitting: Bariatrics

## 2018-08-15 VITALS — BP 117/78 | HR 75 | Temp 98.1°F | Ht 63.0 in | Wt 180.0 lb

## 2018-08-15 DIAGNOSIS — E559 Vitamin D deficiency, unspecified: Secondary | ICD-10-CM | POA: Diagnosis not present

## 2018-08-15 DIAGNOSIS — I10 Essential (primary) hypertension: Secondary | ICD-10-CM | POA: Diagnosis not present

## 2018-08-15 DIAGNOSIS — E669 Obesity, unspecified: Secondary | ICD-10-CM | POA: Diagnosis not present

## 2018-08-15 DIAGNOSIS — Z6831 Body mass index (BMI) 31.0-31.9, adult: Secondary | ICD-10-CM | POA: Diagnosis not present

## 2018-08-16 NOTE — Progress Notes (Signed)
Office: [phone]  /  Fax: [phone]   HPI:   Chief Complaint: OBESITY Diane Sawyer is here to discuss her progress with her obesity treatment plan. She is on the Category 2 plan and is following her eating plan approximately 85 % of the time. She states she is doing yard work 30 minutes 2 times per week. Diane Sawyer had slightly more carbohydrates. She is not snacking and she has gone out to eat. Diane Sawyer had slightly increased hunger over the weekend. Her weight is 180 lb (81.6 kg) today and has not lost weight since her last visit. She has lost 16 lbs since starting treatment with [place].  Hypertension Diane Sawyer is a 72 y.o. female with hypertension. Diane Sawyer denies chest pain or shortness of breath on exertion. Diane Sawyer has had an increase of 2 pounds of water weight. She is taking Norvasc with no side effects. She is working weight loss to help control her blood pressure with the goal of decreasing her risk of heart attack and stroke. Diane Sawyer blood pressure is well controlled.  Vitamin D deficiency Diane Sawyer has a diagnosis of vitamin D deficiency. She is currently taking OTC vit D and denies nausea, vomiting or muscle weakness. Diane Sawyer does not like to take prescription medications.  ALLERGIES: Allergies  Allergen Reactions  . Benazepril Hcl     REACTION: achy- makes her feel strange.  . Levofloxacin Nausea Only  . Mobic [Meloxicam] Other (See Comments)    Felt like chest going to explode    MEDICATIONS: Current Outpatient Medications on File Prior to Visit  Medication Sig Dispense Refill  . acetaminophen (TYLENOL) 500 MG tablet Take 1,000 mg by mouth every 6 (six) hours as needed for mild pain.    [name] ALPRAZolam (XANAX) 0.25 MG tablet Take 1 tablet (0.25 mg total) by mouth 2 (two) times daily as needed for anxiety. 60 tablet 3  . amLODipine (NORVASC) 5 MG tablet TAKE 1 TABLET (5 MG TOTAL) BY MOUTH DAILY. TAKE 1 TABLET EVERY DAY 90 tablet 1  . Cholecalciferol 1000 units capsule Take 2 capsules  (2,000 Units total) by mouth daily. 100 capsule 11  . clobetasol cream (TEMOVATE) 8.27 % Apply 1 application topically daily as needed (dermatosis). Reported on 05/23/2016    . etodolac (LODINE) 500 MG tablet Take 500 mg by mouth daily.    . fexofenadine (ALLEGRA) 180 MG tablet Take 180 mg by mouth daily.      . IRON PO Take 65 mg by mouth daily.     . methocarbamol (ROBAXIN) 500 MG tablet Take 1 tablet (500 mg total) by mouth every 8 (eight) hours as needed for muscle spasms. 60 tablet 1  . pantoprazole (PROTONIX) 40 MG tablet TAKE 1 TABLET BY MOUTH EVERY DAY 90 tablet 1  . timolol (BETIMOL) 0.25 % ophthalmic solution Place 1 drop into the right eye daily.      No current facility-administered medications on file prior to visit.     PAST MEDICAL HISTORY: Past Medical History:  Diagnosis Date  . Allergic rhinitis   . Anemia   . Anxiety   . Back pain   . Diverticulosis   . GERD (gastroesophageal reflux disease)   . Glaucoma   . Hypertension   . Lumbar compression fracture ([place]) 09/07/2016   x 2   . Menopause   . Vitamin D deficiency     PAST SURGICAL HISTORY: Past Surgical History:  Procedure Laterality Date  . CATARACT EXTRACTION, BILATERAL  04/06/2017  with cypass stent implanted  . NO PAST SURGERIES      SOCIAL HISTORY: Social History   Tobacco Use  . Smoking status: Never Smoker  . Smokeless tobacco: Never Used  Substance Use Topics  . Alcohol use: No  . Drug use: No    FAMILY HISTORY: Family History  Problem Relation Age of Onset  . Stroke Mother 82  . Hypertension Mother   . Sudden death Mother   . Anxiety disorder Mother   . Lung cancer Father 63  . Colon cancer Neg Hx     ROS: Review of Systems  Constitutional: Negative for weight loss.  Respiratory: Negative for shortness of breath (on exertion).   Cardiovascular: Negative for chest pain.  Gastrointestinal: Negative for nausea and vomiting.  Musculoskeletal:       Negative for muscle weakness      PHYSICAL EXAM: Blood pressure 117/78, pulse 75, temperature 98.1 F (36.7 C), temperature source Oral, height 5\' 3"  (1.6 m), weight 180 lb (81.6 kg), SpO2 96 %. Body mass index is 31.89 kg/m. Physical Exam  Constitutional: She is oriented to person, place, and time. She appears well-developed and well-nourished.  Cardiovascular: Normal rate.  Pulmonary/Chest: Effort normal.  Musculoskeletal: Normal range of motion.  Neurological: She is oriented to person, place, and time.  Skin: Skin is warm and dry.  Psychiatric: She has a normal mood and affect. Her behavior is normal.  Vitals reviewed.   RECENT LABS AND TESTS: BMET    Component Value Date/Time   NA 140 06/17/2018 1251   NA 142 12/25/2017 1021   K 4.6 06/17/2018 1251   CL 108 06/17/2018 1251   CO2 26 06/17/2018 1251   GLUCOSE 107 (H) 06/17/2018 1251   BUN 14 06/17/2018 1251   BUN 12 12/25/2017 1021   CREATININE 0.70 06/17/2018 1251   CALCIUM 9.0 06/17/2018 1251   GFRNONAA >60 06/17/2018 1251   GFRAA >60 06/17/2018 1251   Lab Results  Component Value Date   HGBA1C 5.8 (H) 06/20/2018   HGBA1C 5.8 (H) 12/25/2017   Lab Results  Component Value Date   INSULIN 9.0 12/25/2017   CBC    Component Value Date/Time   WBC 7.3 06/17/2018 1251   RBC 4.40 06/17/2018 1251   HGB 13.2 06/17/2018 1251   HGB 14.0 12/25/2017 1021   HCT 42.0 06/17/2018 1251   HCT 41.5 12/25/2017 1021   PLT 210 06/17/2018 1251   MCV 95.5 06/17/2018 1251   MCV 88 12/25/2017 1021   MCH 30.0 06/17/2018 1251   MCHC 31.4 06/17/2018 1251   RDW 12.9 06/17/2018 1251   RDW 13.7 12/25/2017 1021   LYMPHSABS 1.4 12/25/2017 1021   MONOABS 0.5 06/12/2017 1111   EOSABS 0.1 12/25/2017 1021   BASOSABS 0.0 12/25/2017 1021   Iron/TIBC/Ferritin/ %Sat    Component Value Date/Time   IRON 82 05/23/2016 1005   IRONPCTSAT 24.9 05/23/2016 1005   Lipid Panel     Component Value Date/Time   CHOL 157 06/20/2018 0859   TRIG 81 06/20/2018 0859   HDL 58  06/20/2018 0859   CHOLHDL 3 06/12/2017 1111   VLDL 23.6 06/12/2017 1111   LDLCALC 83 06/20/2018 0859   Hepatic Function Panel     Component Value Date/Time   PROT 6.7 06/17/2018 1251   PROT 7.0 12/25/2017 1021   ALBUMIN 3.7 06/17/2018 1251   ALBUMIN 4.5 12/25/2017 1021   AST 17 06/17/2018 1251   ALT 12 06/17/2018 1251   ALKPHOS 45 06/17/2018  1251   BILITOT 0.5 06/17/2018 1251   BILITOT 0.3 12/25/2017 1021   BILIDIR 0.0 06/12/2017 1111      Component Value Date/Time   TSH 1.270 12/25/2017 1021   TSH 0.75 06/12/2017 1111   TSH 0.87 11/09/2015 0940   Results for DEMI, TRIEU (MRN [id number]) as of 08/16/2018 11:10  Ref. Range 06/20/2018 09:13  Vitamin D, 25-Hydroxy Latest Ref Range: 30.0 - 100.0 ng/mL 35.3   ASSESSMENT AND PLAN: Essential hypertension  Vitamin D deficiency  Class 1 obesity with serious comorbidity and body mass index (BMI) of 31.0 to 31.9 in adult, unspecified obesity type  PLAN:  Hypertension We discussed sodium restriction, working on healthy weight loss, and a regular exercise program as the means to achieve improved blood pressure control. Diane Sawyer agreed with this plan and agreed to follow up as directed. We will continue to monitor her blood pressure as well as her progress with the above lifestyle modifications. She will continue norvasc as prescribed and she will limit her sodium intake. Diane Sawyer will decrease eating out and will watch for signs of hypotension as she continues her lifestyle modifications.  Vitamin D Deficiency Diane Sawyer was informed that low vitamin D levels contributes to fatigue and are associated with obesity, breast, and colon cancer. She agrees to continue to take OTC Vit D @2 ,000 IU daily and will follow up for routine testing of vitamin D, at least 2-3 times per year. She was informed of the risk of over-replacement of vitamin D and agrees to not increase her dose unless she discusses this with [place] first. We will recheck vitamin D level in  October and Diane Sawyer will follow up as directed.  I spent > than 50% of the 15 minute visit on counseling as documented in the note.  Obesity Diane Sawyer is currently in the action stage of change. As such, her goal is to continue with weight loss efforts She has agreed to follow the Category 2 plan Diane Sawyer has been instructed to continue yard work most days per week for weight loss and overall health benefits. We discussed the following Behavioral Modification Strategies today: increase H2O intake up to 64+ ounces per day, no skipping meals, rinse all canned vegetables or use frozen vegetables, decrease eating out and holiday eating strategies   Diane Sawyer has agreed to follow up with our clinic in 2 weeks. She was informed of the importance of frequent follow up visits to maximize her success with intensive lifestyle modifications for her multiple health conditions.   OBESITY BEHAVIORAL INTERVENTION VISIT  Today's visit was # 13   Starting weight: 196 lbs Starting date: 12/25/17 Today's weight : 180 lbs  Today's date: 08/15/2018 Total lbs lost to date: 16 At least 15 minutes was spent on discussing the following behavioral interventions:    ASK: We discussed the diagnosis of obesity with Diane Sawyer today and Diane Sawyer agreed to give [place] permission to discuss obesity behavioral modification therapy today.  ASSESS: Diane Sawyer has the diagnosis of obesity and her BMI today is 31.89 Diane Sawyer is in the action stage of change   ADVISE: Diane Sawyer was educated on the multiple health risks of obesity as well as the benefit of weight loss to improve her health. She was advised of the need for long term treatment and the importance of lifestyle modifications to improve her current health and to decrease her risk of future health problems.  AGREE: Multiple dietary modification options and treatment options were discussed and  Diane Sawyer agreed to follow  the recommendations documented in the above note.  ARRANGE: Diane Sawyer was  educated on the importance of frequent visits to treat obesity as outlined per CMS and USPSTF guidelines and agreed to schedule her next follow up appointment today.  Corey Skains, am acting as Location manager for [employer]. Owens Shark, DO  I have reviewed the above documentation for accuracy and completeness, and I agree with the above. -Jearld Lesch, DO

## 2018-08-17 DIAGNOSIS — H2511 Age-related nuclear cataract, right eye: Secondary | ICD-10-CM | POA: Diagnosis not present

## 2018-08-17 DIAGNOSIS — H401131 Primary open-angle glaucoma, bilateral, mild stage: Secondary | ICD-10-CM | POA: Diagnosis not present

## 2018-08-22 ENCOUNTER — Ambulatory Visit (INDEPENDENT_AMBULATORY_CARE_PROVIDER_SITE_OTHER): Payer: Self-pay | Admitting: Family Medicine

## 2018-08-29 ENCOUNTER — Ambulatory Visit (INDEPENDENT_AMBULATORY_CARE_PROVIDER_SITE_OTHER): Payer: Medicare Other | Admitting: Bariatrics

## 2018-09-05 ENCOUNTER — Ambulatory Visit (INDEPENDENT_AMBULATORY_CARE_PROVIDER_SITE_OTHER): Payer: Medicare Other | Admitting: Bariatrics

## 2018-09-05 VITALS — BP 126/78 | HR 70 | Temp 97.8°F | Ht 63.0 in | Wt 178.0 lb

## 2018-09-05 DIAGNOSIS — Z6831 Body mass index (BMI) 31.0-31.9, adult: Secondary | ICD-10-CM | POA: Diagnosis not present

## 2018-09-05 DIAGNOSIS — E559 Vitamin D deficiency, unspecified: Secondary | ICD-10-CM

## 2018-09-05 DIAGNOSIS — I10 Essential (primary) hypertension: Secondary | ICD-10-CM

## 2018-09-05 DIAGNOSIS — E669 Obesity, unspecified: Secondary | ICD-10-CM | POA: Diagnosis not present

## 2018-09-06 NOTE — Progress Notes (Signed)
Office: [phone]  /  Fax: [phone]   HPI:   Chief Complaint: OBESITY Diane Sawyer is here to discuss her progress with her obesity treatment plan. She is on the Category 2 plan and is following her eating plan approximately 90 % of the time. She states she is walking 30 minutes 5 times per week. Diane Sawyer is using recipes that were given, and she feels like she has more variety. Her hunger is controlled. Her weight is 178 lb (80.7 kg) today and has had a weight loss of 2 pounds over a period of 3 weeks since her last visit. She has lost 18 lbs since starting treatment with [place].  Hypertension Diane Sawyer is a 72 y.o. female with hypertension. Diane Sawyer is currently taking amlodipine and timolol. Diane Sawyer denies chest pain or shortness of breath on exertion. She is working weight loss to help control her [name] with the goal of decreasing her risk of heart attack and stroke. Diane Sawyer is currently controlled.  Vitamin D deficiency Diane Sawyer has a diagnosis of vitamin D deficiency. She is currently taking OTC vit D and denies nausea, vomiting or muscle weakness.  ALLERGIES: Allergies  Allergen Reactions  . Benazepril Hcl     REACTION: achy- makes her feel strange.  . Levofloxacin Nausea Only  . Mobic [Meloxicam] Other (See Comments)    Felt like chest going to explode    MEDICATIONS: Current Outpatient Medications on File Prior to Visit  Medication Sig Dispense Refill  . acetaminophen (TYLENOL) 500 MG tablet Take 1,000 mg by mouth every 6 (six) hours as needed for mild pain.    [name] ALPRAZolam (XANAX) 0.25 MG tablet Take 1 tablet (0.25 mg total) by mouth 2 (two) times daily as needed for anxiety. 60 tablet 3  . amLODipine (NORVASC) 5 MG tablet TAKE 1 TABLET (5 MG TOTAL) BY MOUTH DAILY. TAKE 1 TABLET EVERY DAY 90 tablet 1  . Cholecalciferol 1000 units capsule Take 2 capsules (2,000 Units total) by mouth daily. 100 capsule 11  . clobetasol cream (TEMOVATE) 4.33 % Apply 1  application topically daily as needed (dermatosis). Reported on 05/23/2016    . etodolac (LODINE) 500 MG tablet Take 500 mg by mouth daily.    . fexofenadine (ALLEGRA) 180 MG tablet Take 180 mg by mouth daily.      . IRON PO Take 65 mg by mouth daily.     . methocarbamol (ROBAXIN) 500 MG tablet Take 1 tablet (500 mg total) by mouth every 8 (eight) hours as needed for muscle spasms. 60 tablet 1  . pantoprazole (PROTONIX) 40 MG tablet TAKE 1 TABLET BY MOUTH EVERY DAY 90 tablet 1  . timolol (BETIMOL) 0.25 % ophthalmic solution Place 1 drop into the right eye daily.      No current facility-administered medications on file prior to visit.     PAST MEDICAL HISTORY: Past Medical History:  Diagnosis Date  . Allergic rhinitis   . Anemia   . Anxiety   . Back pain   . Diverticulosis   . GERD (gastroesophageal reflux disease)   . Glaucoma   . Hypertension   . Lumbar compression fracture ([place]) 09/07/2016   x 2   . Menopause   . Vitamin D deficiency     PAST SURGICAL HISTORY: Past Surgical History:  Procedure Laterality Date  . CATARACT EXTRACTION, BILATERAL  04/06/2017   with cypass stent implanted  . NO PAST SURGERIES      SOCIAL HISTORY: Social  History   Tobacco Use  . Smoking status: Never Smoker  . Smokeless tobacco: Never Used  Substance Use Topics  . Alcohol use: No  . Drug use: No    FAMILY HISTORY: Family History  Problem Relation Age of Onset  . Stroke Mother 16  . Hypertension Mother   . Sudden death Mother   . Anxiety disorder Mother   . Lung cancer Father 64  . Colon cancer Neg Hx     ROS: Review of Systems  Constitutional: Positive for weight loss.  Respiratory: Negative for shortness of breath (on exertion).   Cardiovascular: Negative for chest pain.  Gastrointestinal: Negative for nausea and vomiting.  Musculoskeletal:       Negative for muscle weakness    PHYSICAL EXAM: [name] 126/78, pulse 70, temperature 97.8 F (36.6 C), temperature  source Oral, height 5\' 3"  (1.6 m), weight 178 lb (80.7 kg), SpO2 96 %. Body mass index is 31.53 kg/m. Physical Exam  Constitutional: She is oriented to person, place, and time. She appears well-developed and well-nourished.  Cardiovascular: Normal rate.  Pulmonary/Chest: Effort normal.  Musculoskeletal: Normal range of motion.  Neurological: She is oriented to person, place, and time.  Skin: Skin is warm and dry.  Psychiatric: She has a normal mood and affect. Her behavior is normal.  Vitals reviewed.   RECENT LABS AND TESTS: BMET    Component Value Date/Time   NA 140 06/17/2018 1251   NA 142 12/25/2017 1021   K 4.6 06/17/2018 1251   CL 108 06/17/2018 1251   CO2 26 06/17/2018 1251   GLUCOSE 107 (H) 06/17/2018 1251   BUN 14 06/17/2018 1251   BUN 12 12/25/2017 1021   CREATININE 0.70 06/17/2018 1251   CALCIUM 9.0 06/17/2018 1251   GFRNONAA >60 06/17/2018 1251   GFRAA >60 06/17/2018 1251   Lab Results  Component Value Date   HGBA1C 5.8 (H) 06/20/2018   HGBA1C 5.8 (H) 12/25/2017   Lab Results  Component Value Date   INSULIN 9.0 12/25/2017   CBC    Component Value Date/Time   WBC 7.3 06/17/2018 1251   RBC 4.40 06/17/2018 1251   HGB 13.2 06/17/2018 1251   HGB 14.0 12/25/2017 1021   HCT 42.0 06/17/2018 1251   HCT 41.5 12/25/2017 1021   PLT 210 06/17/2018 1251   MCV 95.5 06/17/2018 1251   MCV 88 12/25/2017 1021   MCH 30.0 06/17/2018 1251   MCHC 31.4 06/17/2018 1251   RDW 12.9 06/17/2018 1251   RDW 13.7 12/25/2017 1021   LYMPHSABS 1.4 12/25/2017 1021   MONOABS 0.5 06/12/2017 1111   EOSABS 0.1 12/25/2017 1021   BASOSABS 0.0 12/25/2017 1021   Iron/TIBC/Ferritin/ %Sat    Component Value Date/Time   IRON 82 05/23/2016 1005   IRONPCTSAT 24.9 05/23/2016 1005   Lipid Panel     Component Value Date/Time   CHOL 157 06/20/2018 0859   TRIG 81 06/20/2018 0859   HDL 58 06/20/2018 0859   CHOLHDL 3 06/12/2017 1111   VLDL 23.6 06/12/2017 1111   LDLCALC 83 06/20/2018  0859   Hepatic Function Panel     Component Value Date/Time   PROT 6.7 06/17/2018 1251   PROT 7.0 12/25/2017 1021   ALBUMIN 3.7 06/17/2018 1251   ALBUMIN 4.5 12/25/2017 1021   AST 17 06/17/2018 1251   ALT 12 06/17/2018 1251   ALKPHOS 45 06/17/2018 1251   BILITOT 0.5 06/17/2018 1251   BILITOT 0.3 12/25/2017 1021   BILIDIR 0.0 06/12/2017  1111      Component Value Date/Time   TSH 1.270 12/25/2017 1021   TSH 0.75 06/12/2017 1111   TSH 0.87 11/09/2015 0940   Results for YAELIS, SCHARFENBERG (MRN [id number]) as of 09/06/2018 08:37  Ref. Range 06/20/2018 09:13  Vitamin D, 25-Hydroxy Latest Ref Range: 30.0 - 100.0 ng/mL 35.3   ASSESSMENT AND PLAN: Essential hypertension  Vitamin D deficiency  Class 1 obesity with serious comorbidity and body mass index (BMI) of 31.0 to 31.9 in adult, unspecified obesity type  PLAN:  Hypertension We discussed sodium restriction, working on healthy weight loss, and a regular exercise program as the means to achieve improved [name] control. Diane Sawyer agreed with this plan and agreed to follow up as directed. We will continue to monitor her [name] as well as her progress with the above lifestyle modifications. She will continue amlodipine and timolol and will watch for signs of hypotension as she continues her lifestyle modifications.  Vitamin D Deficiency Diane Sawyer was informed that low vitamin D levels contributes to fatigue and are associated with obesity, breast, and colon cancer. She agrees to continue to take OTC vitamin D and will follow up for routine testing of vitamin D, at least 2-3 times per year. She was informed of the risk of over-replacement of vitamin D and agrees to not increase her dose unless she discusses this with [place] first. We will recheck labs at the next visit and Diane Sawyer will follow up at the agreed upon time.  Obesity Diane Sawyer is currently in the action stage of change. As such, her goal is to continue with weight loss efforts She  has agreed to follow the Category 2 plan Diane Sawyer has been instructed to work up to a goal of 150 minutes of combined cardio and strengthening exercise per week for weight loss and overall health benefits. We discussed the following Behavioral Modification Strategies today: increase H2O intake, no skipping meals, increasing lean protein intake, decreasing simple carbohydrates , increasing vegetables, decreasing sodium intake, decrease eating out and work on meal planning and easy cooking plans  Diane Sawyer has agreed to follow up with our clinic in 3 weeks fasting. She was informed of the importance of frequent follow up visits to maximize her success with intensive lifestyle modifications for her multiple health conditions.   OBESITY BEHAVIORAL INTERVENTION VISIT  Today's visit was # 14   Starting weight: 196 lbs Starting date: 12/25/17 Today's weight : 178 lbs  Today's date: 09/05/2018 Total lbs lost to date: 3   ASK: We discussed the diagnosis of obesity with Gladis Riffle today and Heath agreed to give [place] permission to discuss obesity behavioral modification therapy today.  ASSESS: Anivea has the diagnosis of obesity and her BMI today is 31.54 Nitasha is in the action stage of change   ADVISE: Sharryn was educated on the multiple health risks of obesity as well as the benefit of weight loss to improve her health. She was advised of the need for long term treatment and the importance of lifestyle modifications to improve her current health and to decrease her risk of future health problems.  AGREE: Multiple dietary modification options and treatment options were discussed and  Charene agreed to follow the recommendations documented in the above note.  ARRANGE: Cheyan was educated on the importance of frequent visits to treat obesity as outlined per CMS and USPSTF guidelines and agreed to schedule her next follow up appointment today.  Corey Skains, am acting as Location manager for [employer]  Kristian Covey, DO  I have reviewed the above documentation for accuracy and completeness, and I agree with the above. -Jearld Lesch, DO

## 2018-09-25 ENCOUNTER — Ambulatory Visit (INDEPENDENT_AMBULATORY_CARE_PROVIDER_SITE_OTHER): Payer: Medicare Other | Admitting: Bariatrics

## 2018-09-25 VITALS — BP 134/85 | HR 75 | Temp 98.0°F | Ht 63.0 in | Wt 179.0 lb

## 2018-09-25 DIAGNOSIS — E669 Obesity, unspecified: Secondary | ICD-10-CM | POA: Diagnosis not present

## 2018-09-25 DIAGNOSIS — E559 Vitamin D deficiency, unspecified: Secondary | ICD-10-CM

## 2018-09-25 DIAGNOSIS — I10 Essential (primary) hypertension: Secondary | ICD-10-CM

## 2018-09-25 DIAGNOSIS — Z6831 Body mass index (BMI) 31.0-31.9, adult: Secondary | ICD-10-CM

## 2018-09-25 DIAGNOSIS — R739 Hyperglycemia, unspecified: Secondary | ICD-10-CM | POA: Diagnosis not present

## 2018-09-25 DIAGNOSIS — E66811 Obesity, class 1: Secondary | ICD-10-CM

## 2018-09-26 LAB — COMPREHENSIVE METABOLIC PANEL
A/G RATIO: 2.2 (ref 1.2–2.2)
ALBUMIN: 4.7 g/dL (ref 3.5–4.8)
ALK PHOS: 47 IU/L (ref 39–117)
ALT: 11 IU/L (ref 0–32)
AST: 16 IU/L (ref 0–40)
BILIRUBIN TOTAL: 0.4 mg/dL (ref 0.0–1.2)
BUN / CREAT RATIO: 20 (ref 12–28)
BUN: 16 mg/dL (ref 8–27)
CO2: 25 mmol/L (ref 20–29)
Calcium: 9.2 mg/dL (ref 8.7–10.3)
Chloride: 105 mmol/L (ref 96–106)
Creatinine, Ser: 0.79 mg/dL (ref 0.57–1.00)
GFR calc non Af Amer: 76 mL/min/{1.73_m2} (ref >59)
GFR, EST AFRICAN AMERICAN: 87 mL/min/{1.73_m2} (ref >59)
Globulin, Total: 2.1 g/dL (ref 1.5–4.5)
Glucose: 91 mg/dL (ref 65–99)
POTASSIUM: 5.2 mmol/L (ref 3.5–5.2)
SODIUM: 144 mmol/L (ref 134–144)
TOTAL PROTEIN: 6.8 g/dL (ref 6.0–8.5)

## 2018-09-26 LAB — VITAMIN D 25 HYDROXY (VIT D DEFICIENCY, FRACTURES): Vit D, 25-Hydroxy: 38.5 ng/mL (ref 30.0–100.0)

## 2018-09-26 LAB — HEMOGLOBIN A1C
Est. average glucose Bld gHb Est-mCnc: 114 mg/dL
HEMOGLOBIN A1C: 5.6 % (ref 4.8–5.6)

## 2018-09-26 LAB — INSULIN, RANDOM: INSULIN: 7.1 u[IU]/mL (ref 2.6–24.9)

## 2018-09-27 NOTE — Progress Notes (Signed)
Office: [phone]  /  Fax: [phone]   HPI:   Chief Complaint: OBESITY Diane Sawyer is here to discuss her progress with her obesity treatment plan. She is on the Category 2 plan and is following her eating plan approximately 80 % of the time. She states she is doing yard work 15 minutes 5 times per week. Diane Sawyer has been on vacation, but has been eating in and no eating out.   Her weight is 179 lb (81.2 kg) today and has had a weight gain of 1 pound over a period of 3 weeks since her last visit. She has lost 17 lbs since starting treatment with [place].  Hypertension Diane Sawyer is a 72 y.o. female with hypertension. Diane Sawyer denies lightheadedness. She is working on weight loss to help control her blood pressure with the goal of decreasing her risk of heart attack and stroke. She is taking blood pressure agents with no side effects. Diane Sawyer's blood pressure is reasonably controlled.   Vitamin D deficiency Diane Sawyer has a diagnosis of vitamin D deficiency. She is currently taking OTC vit D and denies nausea, vomiting or muscle weakness.  ALLERGIES: Allergies  Allergen Reactions  . Benazepril Hcl     REACTION: achy- makes her feel strange.  . Levofloxacin Nausea Only  . Mobic [Meloxicam] Other (See Comments)    Felt like chest going to explode    MEDICATIONS: Current Outpatient Medications on File Prior to Visit  Medication Sig Dispense Refill  . acetaminophen (TYLENOL) 500 MG tablet Take 1,000 mg by mouth every 6 (six) hours as needed for mild pain.    [name] ALPRAZolam (XANAX) 0.25 MG tablet Take 1 tablet (0.25 mg total) by mouth 2 (two) times daily as needed for anxiety. 60 tablet 3  . amLODipine (NORVASC) 5 MG tablet TAKE 1 TABLET (5 MG TOTAL) BY MOUTH DAILY. TAKE 1 TABLET EVERY DAY 90 tablet 1  . Cholecalciferol 1000 units capsule Take 2 capsules (2,000 Units total) by mouth daily. 100 capsule 11  . clobetasol cream (TEMOVATE) 3.56 % Apply 1 application topically daily as needed (dermatosis).  Reported on 05/23/2016    . etodolac (LODINE) 500 MG tablet Take 500 mg by mouth daily.    . fexofenadine (ALLEGRA) 180 MG tablet Take 180 mg by mouth daily.      . IRON PO Take 65 mg by mouth daily.     . methocarbamol (ROBAXIN) 500 MG tablet Take 1 tablet (500 mg total) by mouth every 8 (eight) hours as needed for muscle spasms. 60 tablet 1  . pantoprazole (PROTONIX) 40 MG tablet TAKE 1 TABLET BY MOUTH EVERY DAY 90 tablet 1  . timolol (BETIMOL) 0.25 % ophthalmic solution Place 1 drop into the right eye daily.      No current facility-administered medications on file prior to visit.     PAST MEDICAL HISTORY: Past Medical History:  Diagnosis Date  . Allergic rhinitis   . Anemia   . Anxiety   . Back pain   . Diverticulosis   . GERD (gastroesophageal reflux disease)   . Glaucoma   . Hypertension   . Lumbar compression fracture ([place]) 09/07/2016   x 2   . Menopause   . Vitamin D deficiency     PAST SURGICAL HISTORY: Past Surgical History:  Procedure Laterality Date  . CATARACT EXTRACTION, BILATERAL  04/06/2017   with cypass stent implanted  . NO PAST SURGERIES      SOCIAL HISTORY: Social History   Tobacco Use  .  Smoking status: Never Smoker  . Smokeless tobacco: Never Used  Substance Use Topics  . Alcohol use: No  . Drug use: No    FAMILY HISTORY: Family History  Problem Relation Age of Onset  . Stroke Mother 70  . Hypertension Mother   . Sudden death Mother   . Anxiety disorder Mother   . Lung cancer Father 32  . Colon cancer Neg Hx     ROS: Review of Systems  Constitutional: Negative for weight loss.  Gastrointestinal: Negative for nausea and vomiting.  Musculoskeletal:       Negative for muscle weakness.  Neurological:       Negative for lightheadedness.    PHYSICAL EXAM: Blood pressure 134/85, pulse 75, temperature 98 F (36.7 C), temperature source Oral, height 5\' 3"  (1.6 m), weight 179 lb (81.2 kg), SpO2 97 %. Body mass index is 31.71  kg/m. Physical Exam  Constitutional: She is oriented to person, place, and time. She appears well-developed and well-nourished.  Cardiovascular: Normal rate.  Pulmonary/Chest: Effort normal.  Musculoskeletal: Normal range of motion.  Neurological: She is oriented to person, place, and time.  Skin: Skin is warm and dry.  Psychiatric: She has a normal mood and affect. Her behavior is normal.  Vitals reviewed.   RECENT LABS AND TESTS: BMET    Component Value Date/Time   NA 144 09/25/2018 1004   K 5.2 09/25/2018 1004   CL 105 09/25/2018 1004   CO2 25 09/25/2018 1004   GLUCOSE 91 09/25/2018 1004   GLUCOSE 107 (H) 06/17/2018 1251   BUN 16 09/25/2018 1004   CREATININE 0.79 09/25/2018 1004   CALCIUM 9.2 09/25/2018 1004   GFRNONAA 76 09/25/2018 1004   GFRAA 87 09/25/2018 1004   Lab Results  Component Value Date   HGBA1C 5.6 09/25/2018   HGBA1C 5.8 (H) 06/20/2018   HGBA1C 5.8 (H) 12/25/2017   Lab Results  Component Value Date   INSULIN 7.1 09/25/2018   INSULIN 9.0 12/25/2017   CBC    Component Value Date/Time   WBC 7.3 06/17/2018 1251   RBC 4.40 06/17/2018 1251   HGB 13.2 06/17/2018 1251   HGB 14.0 12/25/2017 1021   HCT 42.0 06/17/2018 1251   HCT 41.5 12/25/2017 1021   PLT 210 06/17/2018 1251   MCV 95.5 06/17/2018 1251   MCV 88 12/25/2017 1021   MCH 30.0 06/17/2018 1251   MCHC 31.4 06/17/2018 1251   RDW 12.9 06/17/2018 1251   RDW 13.7 12/25/2017 1021   LYMPHSABS 1.4 12/25/2017 1021   MONOABS 0.5 06/12/2017 1111   EOSABS 0.1 12/25/2017 1021   BASOSABS 0.0 12/25/2017 1021   Iron/TIBC/Ferritin/ %Sat    Component Value Date/Time   IRON 82 05/23/2016 1005   IRONPCTSAT 24.9 05/23/2016 1005   Lipid Panel     Component Value Date/Time   CHOL 157 06/20/2018 0859   TRIG 81 06/20/2018 0859   HDL 58 06/20/2018 0859   CHOLHDL 3 06/12/2017 1111   VLDL 23.6 06/12/2017 1111   LDLCALC 83 06/20/2018 0859   Hepatic Function Panel     Component Value Date/Time   PROT  6.8 09/25/2018 1004   ALBUMIN 4.7 09/25/2018 1004   AST 16 09/25/2018 1004   ALT 11 09/25/2018 1004   ALKPHOS 47 09/25/2018 1004   BILITOT 0.4 09/25/2018 1004   BILIDIR 0.0 06/12/2017 1111      Component Value Date/Time   TSH 1.270 12/25/2017 1021   TSH 0.75 06/12/2017 1111   TSH 0.87 11/09/2015  0940   Results for NEEVA, TREW (MRN [id number]) as of 09/27/2018 14:57  Ref. Range 09/25/2018 10:04  Vitamin D, 25-Hydroxy Latest Ref Range: 30.0 - 100.0 ng/mL 38.5   ASSESSMENT AND PLAN: Essential hypertension - Plan: Comprehensive metabolic panel, Hemoglobin A1c, Insulin, random  Vitamin D deficiency - Plan: VITAMIN D 25 Hydroxy (Vit-D Deficiency, Fractures)  Class 1 obesity with serious comorbidity and body mass index (BMI) of 31.0 to 31.9 in adult, unspecified obesity type  PLAN:  Hypertension We discussed sodium restriction, working on healthy weight loss, and a regular exercise program as the means to achieve improved blood pressure control. We will continue to monitor her blood pressure as well as her progress with the above lifestyle modifications. She will continue her antihypertensive medications as prescribed and will watch for signs of hypotension as she continues her lifestyle modifications. Diane Sawyer agreed with this plan and agreed to follow up as directed in 3 weeks.  Vitamin D Deficiency Diane Sawyer was informed that low vitamin D levels contributes to fatigue and are associated with obesity, breast, and colon cancer. She agrees to continue to take OTC Vit D every day and will follow up for routine testing of vitamin D, at least 2-3 times per year. She was informed of the risk of over-replacement of vitamin D and agrees to not increase her dose unless she discusses this with [place] first. Diane Sawyer agree to follow up at the agreed upon time in 3 weeks.  Obesity Diane Sawyer is currently in the action stage of change. As such, her goal is to continue with weight loss efforts. She has agreed to  follow the Category 2 plan. Diane Sawyer agrees to increase H2O and increase meal planning. Diane Sawyer has been instructed to work up to a goal of 150 minutes of combined cardio and strengthening exercise per week for weight loss and overall health benefits. We discussed the following Behavioral Modification Strategies today: increasing lean protein intake, increase H2O intake, decreasing sodium intake, decrease eating out, no skipping meals, work on meal planning and easy cooking plans, keeping healthy foods in the home, ways to avoid night time snacking, better snacking choices, and decrease liquid calories.  Diane Sawyer has agreed to follow up with our clinic in 2 weeks. She was informed of the importance of frequent follow up visits to maximize her success with intensive lifestyle modifications for her multiple health conditions.   OBESITY BEHAVIORAL INTERVENTION VISIT  Today's visit was # 15   Starting weight: 196 lbs Starting date: 12/25/17 Today's weight : Weight: 179 lb (81.2 kg)  Today's date: 09/25/2018 Total lbs lost to date: 17 At least 15 minutes were spent on discussing the following behavioral intervention visit.  ASK: We discussed the diagnosis of obesity with Diane Sawyer today and Diane Sawyer agreed to give [place] permission to discuss obesity behavioral modification therapy today.  ASSESS: Kasyn has the diagnosis of obesity and her BMI today is 31.72. Wanza is in the action stage of change.   ADVISE: Diane Sawyer was educated on the multiple health risks of obesity as well as the benefit of weight loss to improve her health. She was advised of the need for long term treatment and the importance of lifestyle modifications to improve her current health and to decrease her risk of future health problems.  AGREE: Multiple dietary modification options and treatment options were discussed and  Diane Sawyer agreed to follow the recommendations documented in the above note.  ARRANGE: Diane Sawyer was educated on the  importance of frequent visits  to treat obesity as outlined per CMS and USPSTF guidelines and agreed to schedule her next follow up appointment today.  I, Diane Sawyer, am acting as Location manager for [employer]. Owens Shark, DO  I have reviewed the above documentation for accuracy and completeness, and I agree with the above. -Jearld Lesch, DO

## 2018-10-11 ENCOUNTER — Ambulatory Visit (INDEPENDENT_AMBULATORY_CARE_PROVIDER_SITE_OTHER): Payer: Medicare Other | Admitting: Bariatrics

## 2018-10-11 VITALS — BP 140/86 | HR 78 | Temp 98.2°F | Ht 63.0 in | Wt 179.0 lb

## 2018-10-11 DIAGNOSIS — E559 Vitamin D deficiency, unspecified: Secondary | ICD-10-CM | POA: Diagnosis not present

## 2018-10-11 DIAGNOSIS — E669 Obesity, unspecified: Secondary | ICD-10-CM | POA: Diagnosis not present

## 2018-10-11 DIAGNOSIS — Z6831 Body mass index (BMI) 31.0-31.9, adult: Secondary | ICD-10-CM | POA: Diagnosis not present

## 2018-10-11 DIAGNOSIS — I10 Essential (primary) hypertension: Secondary | ICD-10-CM | POA: Diagnosis not present

## 2018-10-11 DIAGNOSIS — K219 Gastro-esophageal reflux disease without esophagitis: Secondary | ICD-10-CM

## 2018-10-11 DIAGNOSIS — Z9189 Other specified personal risk factors, not elsewhere classified: Secondary | ICD-10-CM | POA: Diagnosis not present

## 2018-10-11 MED ORDER — VITAMIN D (ERGOCALCIFEROL) 1.25 MG (50000 UNIT) PO CAPS: 50000.0000 [IU] | ORAL_CAPSULE | ORAL | 0 refills | Status: DC

## 2018-10-15 NOTE — Progress Notes (Signed)
Office: [phone]  /  Fax: [phone]   HPI:   Chief Complaint: OBESITY Diane Sawyer is here to discuss her progress with her obesity treatment plan. She is on the follow the Category 2 plan and is following her eating plan approximately 80 to 85 % of the time. She states she is exercising 0 minutes 0 times per week. Terricka denies any challenges and has gained 1 pound of water.  Her weight is 179 lb (81.2 kg) today and has not lost weight since her last visit. She has lost 17 lbs since starting treatment with [place].  Hypertension Diane Sawyer is a 72 y.o. female with hypertension. She is taking amlodipine 5mg  and she denies any side effects. Diane Sawyer's blood pressure is reasonably well controlled. She is working on weight loss to help control her blood pressure with the goal of decreasing her risk of heart attack and stroke. Jackqulyn denies lightheadedness.  Vitamin D deficiency Diane Sawyer has a diagnosis of vitamin D deficiency. She is currently taking OTC vit D 1,000 units.  At risk for osteopenia and osteoporosis Diane Sawyer is at higher risk of osteopenia and osteoporosis due to vitamin D deficiency.   Gastroesophageal Reflux Disease Diane Sawyer has a history of heartburn and took some Pepto Bismol. She had been taking proton pump inhibitors.  ALLERGIES: Allergies  Allergen Reactions  . Benazepril Hcl     REACTION: achy- makes her feel strange.  . Levofloxacin Nausea Only  . Mobic [Meloxicam] Other (See Comments)    Felt like chest going to explode    MEDICATIONS: Current Outpatient Medications on File Prior to Visit  Medication Sig Dispense Refill  . acetaminophen (TYLENOL) 500 MG tablet Take 1,000 mg by mouth every 6 (six) hours as needed for mild pain.    [name] ALPRAZolam (XANAX) 0.25 MG tablet Take 1 tablet (0.25 mg total) by mouth 2 (two) times daily as needed for anxiety. 60 tablet 3  . amLODipine (NORVASC) 5 MG tablet TAKE 1 TABLET (5 MG TOTAL) BY MOUTH DAILY. TAKE 1 TABLET EVERY DAY 90 tablet 1    . Cholecalciferol 1000 units capsule Take 2 capsules (2,000 Units total) by mouth daily. 100 capsule 11  . clobetasol cream (TEMOVATE) 5.95 % Apply 1 application topically daily as needed (dermatosis). Reported on 05/23/2016    . etodolac (LODINE) 500 MG tablet Take 500 mg by mouth daily.    . fexofenadine (ALLEGRA) 180 MG tablet Take 180 mg by mouth daily.      . IRON PO Take 65 mg by mouth daily.     . methocarbamol (ROBAXIN) 500 MG tablet Take 1 tablet (500 mg total) by mouth every 8 (eight) hours as needed for muscle spasms. 60 tablet 1  . pantoprazole (PROTONIX) 40 MG tablet TAKE 1 TABLET BY MOUTH EVERY DAY 90 tablet 1  . timolol (BETIMOL) 0.25 % ophthalmic solution Place 1 drop into the right eye daily.      No current facility-administered medications on file prior to visit.     PAST MEDICAL HISTORY: Past Medical History:  Diagnosis Date  . Allergic rhinitis   . Anemia   . Anxiety   . Back pain   . Diverticulosis   . GERD (gastroesophageal reflux disease)   . Glaucoma   . Hypertension   . Lumbar compression fracture ([place]) 09/07/2016   x 2   . Menopause   . Vitamin D deficiency     PAST SURGICAL HISTORY: Past Surgical History:  Procedure Laterality Date  .  CATARACT EXTRACTION, BILATERAL  04/06/2017   with cypass stent implanted  . NO PAST SURGERIES      SOCIAL HISTORY: Social History   Tobacco Use  . Smoking status: Never Smoker  . Smokeless tobacco: Never Used  Substance Use Topics  . Alcohol use: No  . Drug use: No    FAMILY HISTORY: Family History  Problem Relation Age of Onset  . Stroke Mother 89  . Hypertension Mother   . Sudden death Mother   . Anxiety disorder Mother   . Lung cancer Father 52  . Colon cancer Neg Hx     ROS: Review of Systems  Constitutional: Negative for weight loss.  Gastrointestinal: Positive for heartburn.  Neurological:       Negative for lightheadedness.    PHYSICAL EXAM: Blood pressure 140/86, pulse 78,  temperature 98.2 F (36.8 C), temperature source Oral, height 5\' 3"  (1.6 m), weight 179 lb (81.2 kg), SpO2 97 %. Body mass index is 31.71 kg/m. Physical Exam  Constitutional: She is oriented to person, place, and time. She appears well-developed and well-nourished.  Cardiovascular: Normal rate.  Pulmonary/Chest: Effort normal.  Musculoskeletal: Normal range of motion.  Neurological: She is oriented to person, place, and time.  Skin: Skin is warm and dry.  Psychiatric: She has a normal mood and affect. Her behavior is normal.  Vitals reviewed.   RECENT LABS AND TESTS: BMET    Component Value Date/Time   NA 144 09/25/2018 1004   K 5.2 09/25/2018 1004   CL 105 09/25/2018 1004   CO2 25 09/25/2018 1004   GLUCOSE 91 09/25/2018 1004   GLUCOSE 107 (H) 06/17/2018 1251   BUN 16 09/25/2018 1004   CREATININE 0.79 09/25/2018 1004   CALCIUM 9.2 09/25/2018 1004   GFRNONAA 76 09/25/2018 1004   GFRAA 87 09/25/2018 1004   Lab Results  Component Value Date   HGBA1C 5.6 09/25/2018   HGBA1C 5.8 (H) 06/20/2018   HGBA1C 5.8 (H) 12/25/2017   Lab Results  Component Value Date   INSULIN 7.1 09/25/2018   INSULIN 9.0 12/25/2017   CBC    Component Value Date/Time   WBC 7.3 06/17/2018 1251   RBC 4.40 06/17/2018 1251   HGB 13.2 06/17/2018 1251   HGB 14.0 12/25/2017 1021   HCT 42.0 06/17/2018 1251   HCT 41.5 12/25/2017 1021   PLT 210 06/17/2018 1251   MCV 95.5 06/17/2018 1251   MCV 88 12/25/2017 1021   MCH 30.0 06/17/2018 1251   MCHC 31.4 06/17/2018 1251   RDW 12.9 06/17/2018 1251   RDW 13.7 12/25/2017 1021   LYMPHSABS 1.4 12/25/2017 1021   MONOABS 0.5 06/12/2017 1111   EOSABS 0.1 12/25/2017 1021   BASOSABS 0.0 12/25/2017 1021   Iron/TIBC/Ferritin/ %Sat    Component Value Date/Time   IRON 82 05/23/2016 1005   IRONPCTSAT 24.9 05/23/2016 1005   Lipid Panel     Component Value Date/Time   CHOL 157 06/20/2018 0859   TRIG 81 06/20/2018 0859   HDL 58 06/20/2018 0859   CHOLHDL 3  06/12/2017 1111   VLDL 23.6 06/12/2017 1111   LDLCALC 83 06/20/2018 0859   Hepatic Function Panel     Component Value Date/Time   PROT 6.8 09/25/2018 1004   ALBUMIN 4.7 09/25/2018 1004   AST 16 09/25/2018 1004   ALT 11 09/25/2018 1004   ALKPHOS 47 09/25/2018 1004   BILITOT 0.4 09/25/2018 1004   BILIDIR 0.0 06/12/2017 1111      Component Value Date/Time  TSH 1.270 12/25/2017 1021   TSH 0.75 06/12/2017 1111   TSH 0.87 11/09/2015 0940   Results for MARCELA, ALATORRE (MRN [id number]) as of 10/15/2018 07:12  Ref. Range 09/25/2018 10:04  Vitamin D, 25-Hydroxy Latest Ref Range: 30.0 - 100.0 ng/mL 38.5   ASSESSMENT AND PLAN: Vitamin D deficiency - Plan: Vitamin D, Ergocalciferol, (DRISDOL) 1.25 MG (50000 UT) CAPS capsule  Essential hypertension  Gastroesophageal reflux disease, esophagitis presence not specified  At risk for osteoporosis  Class 1 obesity with serious comorbidity and body mass index (BMI) of 31.0 to 31.9 in adult, unspecified obesity type  PLAN:  Hypertension We discussed sodium restriction, working on healthy weight loss, and a regular exercise program as the means to achieve improved blood pressure control.  We will continue to monitor her blood pressure as well as her progress with the above lifestyle modifications. She will continue her antihypertensive medications as prescribed and will watch for signs of hypotension as she continues her lifestyle modifications. Sonyia agreed with this plan and agreed to follow up as directed in 2 weeks.  Vitamin D Deficiency Tajuanna was informed that low vitamin D levels contributes to fatigue and are associated with obesity, breast, and colon cancer. She agrees to begin  to take prescription Vit D @50 ,000 IU every week #4 with no refills and will follow up for routine testing of vitamin D, at least 2-3 times per year. She was informed of the risk of over-replacement of vitamin D and agrees to not increase her dose unless she  discusses this with [place] first. Mannie agrees to follow up as directed.  At risk for osteopenia and osteoporosis Leah was given extended (15 minutes) osteoporosis prevention counseling today. Alvie is at risk for osteopenia and osteoporosis due to her vitamin D deficiency. She was encouraged to take her vitamin D and follow her higher calcium diet and increase strengthening exercise to help strengthen her bones and decrease her risk of osteopenia and osteoporosis.  Gastroesophageal Reflux Disease Cathryn agrees to begin Protonix daily for 3 to 4 weeks and follow up as directed.  Obesity Trace is currently in the action stage of change. As such, her goal is to continue with weight loss efforts. She has agreed to follow the Category 2 plan. She agrees to increase her water intake and to increase her activity. Chelle has been instructed to work up to a goal of 150 minutes of combined cardio and strengthening exercise per week for weight loss and overall health benefits. We discussed the following Behavioral Modification Strategies today: increasing lean protein intake, decreasing simple carbohydrates, increasing vegetables, increase H2O intake, decrease eating out, no skipping meals, and work on meal planning and easy cooking plans.  Toneka has agreed to follow up with our clinic in 2 weeks. She was informed of the importance of frequent follow up visits to maximize her success with intensive lifestyle modifications for her multiple health conditions.   OBESITY BEHAVIORAL INTERVENTION VISIT  Today's visit was # 16   Starting weight: 196 lbs Starting date: 12/25/17 Today's weight : Weight: 179 lb (81.2 kg)  Today's date: 10/11/2018 Total lbs lost to date: 62  ASK: We discussed the diagnosis of obesity with Gladis Riffle today and Kaveri agreed to give [place] permission to discuss obesity behavioral modification therapy today.  ASSESS: Evani has the diagnosis of obesity and her BMI today is  31.72. Mychal is in the action stage of change.   ADVISE: Kaedance was educated on the multiple health  risks of obesity as well as the benefit of weight loss to improve her health. She was advised of the need for long term treatment and the importance of lifestyle modifications to improve her current health and to decrease her risk of future health problems.  AGREE: Multiple dietary modification options and treatment options were discussed and Makenli agreed to follow the recommendations documented in the above note.  ARRANGE: Cora was educated on the importance of frequent visits to treat obesity as outlined per CMS and USPSTF guidelines and agreed to schedule her next follow up appointment today.  I, Marcille Blanco, am acting as Location manager for [employer]. Owens Shark, DO  I have reviewed the above documentation for accuracy and completeness, and I agree with the above. -Jearld Lesch, DO

## 2018-10-29 ENCOUNTER — Other Ambulatory Visit (INDEPENDENT_AMBULATORY_CARE_PROVIDER_SITE_OTHER): Payer: Self-pay | Admitting: Bariatrics

## 2018-10-29 ENCOUNTER — Ambulatory Visit (INDEPENDENT_AMBULATORY_CARE_PROVIDER_SITE_OTHER): Payer: Medicare Other | Admitting: Bariatrics

## 2018-10-29 VITALS — BP 138/86 | HR 75 | Temp 98.7°F | Ht 63.0 in | Wt 180.0 lb

## 2018-10-29 DIAGNOSIS — E669 Obesity, unspecified: Secondary | ICD-10-CM | POA: Diagnosis not present

## 2018-10-29 DIAGNOSIS — Z6832 Body mass index (BMI) 32.0-32.9, adult: Secondary | ICD-10-CM | POA: Diagnosis not present

## 2018-10-29 DIAGNOSIS — K219 Gastro-esophageal reflux disease without esophagitis: Secondary | ICD-10-CM

## 2018-10-29 DIAGNOSIS — I10 Essential (primary) hypertension: Secondary | ICD-10-CM | POA: Diagnosis not present

## 2018-10-29 MED ORDER — PANTOPRAZOLE SODIUM 40 MG PO TBEC: 40.0000 mg | DELAYED_RELEASE_TABLET | Freq: Every day | ORAL | 0 refills | Status: DC

## 2018-10-29 MED ORDER — AMLODIPINE BESYLATE 5 MG PO TABS: 5.0000 mg | ORAL_TABLET | Freq: Every day | ORAL | 0 refills | Status: DC

## 2018-11-05 ENCOUNTER — Encounter (INDEPENDENT_AMBULATORY_CARE_PROVIDER_SITE_OTHER): Payer: Self-pay | Admitting: Bariatrics

## 2018-11-05 NOTE — Progress Notes (Signed)
Office: [phone]  /  Fax: [phone]   HPI:   Chief Complaint: OBESITY Diane Sawyer is here to discuss her progress with her obesity treatment plan. She is on the Category 2 plan and is following her eating plan approximately 80 % of the time. She states she is doing yard work 1 to 2 hours 1 time per week. Diane Sawyer doing well on the Category 2 plan and she denies any struggles with the plan. Her weight is 180 lb (81.6 kg) today and has had a weight gain of 1 pound over a period of 2 weeks since her last visit. She has lost 16 lbs since starting treatment with [place].  Hypertension Diane Sawyer is a 72 y.o. female with hypertension. Diane Sawyer denies chest pain or headaches. She is working weight loss to help control her blood pressure with the goal of decreasing her risk of heart attack and stroke. Diane Sawyer blood pressure is not well controlled.  GERD (gastroesophageal reflux disease) Diane Sawyer has gastroesophageal reflux disease and she is currently taking Protonix. Her GERD is controlled with medication.  ALLERGIES: Allergies  Allergen Reactions  . Benazepril Hcl     REACTION: achy- makes her feel strange.  . Levofloxacin Nausea Only  . Mobic [Meloxicam] Other (See Comments)    Felt like chest going to explode    MEDICATIONS: Current Outpatient Medications on File Prior to Visit  Medication Sig Dispense Refill  . acetaminophen (TYLENOL) 500 MG tablet Take 1,000 mg by mouth every 6 (six) hours as needed for mild pain.    [name] ALPRAZolam (XANAX) 0.25 MG tablet Take 1 tablet (0.25 mg total) by mouth 2 (two) times daily as needed for anxiety. 60 tablet 3  . Cholecalciferol 1000 units capsule Take 2 capsules (2,000 Units total) by mouth daily. 100 capsule 11  . clobetasol cream (TEMOVATE) 2.95 % Apply 1 application topically daily as needed (dermatosis). Reported on 05/23/2016    . etodolac (LODINE) 500 MG tablet Take 500 mg by mouth daily.    . fexofenadine (ALLEGRA) 180 MG tablet Take 180 mg  by mouth daily.      . IRON PO Take 65 mg by mouth daily.     . methocarbamol (ROBAXIN) 500 MG tablet Take 1 tablet (500 mg total) by mouth every 8 (eight) hours as needed for muscle spasms. 60 tablet 1  . timolol (BETIMOL) 0.25 % ophthalmic solution Place 1 drop into the right eye daily.     . Vitamin D, Ergocalciferol, (DRISDOL) 1.25 MG (50000 UT) CAPS capsule Take 1 capsule (50,000 Units total) by mouth every 7 (seven) days. 4 capsule 0   No current facility-administered medications on file prior to visit.     PAST MEDICAL HISTORY: Past Medical History:  Diagnosis Date  . Allergic rhinitis   . Anemia   . Anxiety   . Back pain   . Diverticulosis   . GERD (gastroesophageal reflux disease)   . Glaucoma   . Hypertension   . Lumbar compression fracture ([place]) 09/07/2016   x 2   . Menopause   . Vitamin D deficiency     PAST SURGICAL HISTORY: Past Surgical History:  Procedure Laterality Date  . CATARACT EXTRACTION, BILATERAL  04/06/2017   with cypass stent implanted  . NO PAST SURGERIES      SOCIAL HISTORY: Social History   Tobacco Use  . Smoking status: Never Smoker  . Smokeless tobacco: Never Used  Substance Use Topics  . Alcohol use: No  .  Drug use: No    FAMILY HISTORY: Family History  Problem Relation Age of Onset  . Stroke Mother 19  . Hypertension Mother   . Sudden death Mother   . Anxiety disorder Mother   . Lung cancer Father 68  . Colon cancer Neg Hx     ROS: Review of Systems  Constitutional: Negative for weight loss.  Cardiovascular: Negative for chest pain.  Gastrointestinal: Negative for heartburn.  Neurological: Negative for headaches.    PHYSICAL EXAM: Blood pressure 138/86, pulse 75, temperature 98.7 F (37.1 C), temperature source Oral, height 5\' 3"  (1.6 m), weight 180 lb (81.6 kg), SpO2 96 %. Body mass index is 31.89 kg/m. Physical Exam  Constitutional: She is oriented to person, place, and time. She appears well-developed and  well-nourished.  Cardiovascular: Normal rate.  Pulmonary/Chest: Effort normal.  Musculoskeletal: Normal range of motion.  Neurological: She is oriented to person, place, and time.  Skin: Skin is warm and dry.  Psychiatric: She has a normal mood and affect. Her behavior is normal.  Vitals reviewed.   RECENT LABS AND TESTS: BMET    Component Value Date/Time   NA 144 09/25/2018 1004   K 5.2 09/25/2018 1004   CL 105 09/25/2018 1004   CO2 25 09/25/2018 1004   GLUCOSE 91 09/25/2018 1004   GLUCOSE 107 (H) 06/17/2018 1251   BUN 16 09/25/2018 1004   CREATININE 0.79 09/25/2018 1004   CALCIUM 9.2 09/25/2018 1004   GFRNONAA 76 09/25/2018 1004   GFRAA 87 09/25/2018 1004   Lab Results  Component Value Date   HGBA1C 5.6 09/25/2018   HGBA1C 5.8 (H) 06/20/2018   HGBA1C 5.8 (H) 12/25/2017   Lab Results  Component Value Date   INSULIN 7.1 09/25/2018   INSULIN 9.0 12/25/2017   CBC    Component Value Date/Time   WBC 7.3 06/17/2018 1251   RBC 4.40 06/17/2018 1251   HGB 13.2 06/17/2018 1251   HGB 14.0 12/25/2017 1021   HCT 42.0 06/17/2018 1251   HCT 41.5 12/25/2017 1021   PLT 210 06/17/2018 1251   MCV 95.5 06/17/2018 1251   MCV 88 12/25/2017 1021   MCH 30.0 06/17/2018 1251   MCHC 31.4 06/17/2018 1251   RDW 12.9 06/17/2018 1251   RDW 13.7 12/25/2017 1021   LYMPHSABS 1.4 12/25/2017 1021   MONOABS 0.5 06/12/2017 1111   EOSABS 0.1 12/25/2017 1021   BASOSABS 0.0 12/25/2017 1021   Iron/TIBC/Ferritin/ %Sat    Component Value Date/Time   IRON 82 05/23/2016 1005   IRONPCTSAT 24.9 05/23/2016 1005   Lipid Panel     Component Value Date/Time   CHOL 157 06/20/2018 0859   TRIG 81 06/20/2018 0859   HDL 58 06/20/2018 0859   CHOLHDL 3 06/12/2017 1111   VLDL 23.6 06/12/2017 1111   LDLCALC 83 06/20/2018 0859   Hepatic Function Panel     Component Value Date/Time   PROT 6.8 09/25/2018 1004   ALBUMIN 4.7 09/25/2018 1004   AST 16 09/25/2018 1004   ALT 11 09/25/2018 1004   ALKPHOS  47 09/25/2018 1004   BILITOT 0.4 09/25/2018 1004   BILIDIR 0.0 06/12/2017 1111      Component Value Date/Time   TSH 1.270 12/25/2017 1021   TSH 0.75 06/12/2017 1111   TSH 0.87 11/09/2015 0940   Results for Diane Sawyer (MRN [id number]) as of 11/05/2018 08:07  Ref. Range 09/25/2018 10:04  Vitamin D, 25-Hydroxy Latest Ref Range: 30.0 - 100.0 ng/mL 38.5   ASSESSMENT AND  PLAN: Essential hypertension - Plan: amLODipine (NORVASC) 5 MG tablet  Gastroesophageal reflux disease, esophagitis presence not specified - Plan: pantoprazole (PROTONIX) 40 MG tablet  Class 1 obesity with serious comorbidity and body mass index (BMI) of 32.0 to 32.9 in adult, unspecified obesity type  PLAN:  Hypertension We discussed sodium restriction, working on healthy weight loss, and a regular exercise program as the means to achieve improved blood pressure control. Diane Sawyer agreed with this plan and agreed to follow up as directed. We will continue to monitor her blood pressure as well as her progress with the above lifestyle modifications. She agrees to take Amlodipine 5 mg 1 tablet daily #30 with no refills and will watch for signs of hypotension as she continues her lifestyle modifications.  GERD (gastroesophageal reflux disease) Diane Sawyer agrees to take Pantoprazole 40 mg 1 tablet daily #30 with no refills and follow up with our clinic in 2 weeks.  Obesity Diane Sawyer is currently in the action stage of change. As such, her goal is to continue with weight loss efforts She has agreed to follow the Category 2 plan Diane Sawyer has been instructed to work up to a goal of 150 minutes of combined cardio and strengthening exercise per week for weight loss and overall health benefits. We discussed the following Behavioral Modification Strategies today: increase H2O intake, increasing lean protein intake, decreasing simple carbohydrates, increasing vegetables, more meal planning, mindful eating, intentional exercise, holiday eating  strategies, travel eating strategies and celebration eating strategies  Diane Sawyer has agreed to follow up with our clinic in 2 weeks. She was informed of the importance of frequent follow up visits to maximize her success with intensive lifestyle modifications for her multiple health conditions.   OBESITY BEHAVIORAL INTERVENTION VISIT  Today's visit was # 17   Starting weight: 196 lbs Starting date: 12/25/2017 Today's weight :  180 lbs Today's date: 10/29/2018 Total lbs lost to date: 16 At least 15 minutes were spent on discussing the following behavioral intervention visit.   ASK: We discussed the diagnosis of obesity with Gladis Riffle today and Remy agreed to give [place] permission to discuss obesity behavioral modification therapy today.  ASSESS: Iyannah has the diagnosis of obesity and her BMI today is 31.89 Esbeydi is in the action stage of change   ADVISE: Sabine was educated on the multiple health risks of obesity as well as the benefit of weight loss to improve her health. She was advised of the need for long term treatment and the importance of lifestyle modifications to improve her current health and to decrease her risk of future health problems.  AGREE: Multiple dietary modification options and treatment options were discussed and  Berdella agreed to follow the recommendations documented in the above note.  ARRANGE: Diane Sawyer was educated on the importance of frequent visits to treat obesity as outlined per CMS and USPSTF guidelines and agreed to schedule her next follow up appointment today.  Corey Skains, am acting as Location manager for [employer]. Owens Shark, DO  I have reviewed the above documentation for accuracy and completeness, and I agree with the above. -Jearld Lesch, DO

## 2018-11-14 ENCOUNTER — Ambulatory Visit (INDEPENDENT_AMBULATORY_CARE_PROVIDER_SITE_OTHER): Payer: Medicare Other | Admitting: Bariatrics

## 2018-11-14 ENCOUNTER — Encounter (INDEPENDENT_AMBULATORY_CARE_PROVIDER_SITE_OTHER): Payer: Self-pay | Admitting: Bariatrics

## 2018-11-14 VITALS — BP 138/83 | HR 78 | Temp 98.0°F | Ht 63.0 in | Wt 182.0 lb

## 2018-11-14 DIAGNOSIS — Z6832 Body mass index (BMI) 32.0-32.9, adult: Secondary | ICD-10-CM

## 2018-11-14 DIAGNOSIS — E559 Vitamin D deficiency, unspecified: Secondary | ICD-10-CM

## 2018-11-14 DIAGNOSIS — K219 Gastro-esophageal reflux disease without esophagitis: Secondary | ICD-10-CM

## 2018-11-14 DIAGNOSIS — E669 Obesity, unspecified: Secondary | ICD-10-CM | POA: Diagnosis not present

## 2018-11-14 DIAGNOSIS — I10 Essential (primary) hypertension: Secondary | ICD-10-CM | POA: Diagnosis not present

## 2018-11-14 NOTE — Progress Notes (Signed)
Office: [phone]  /  Fax: [phone]   HPI:   Chief Complaint: OBESITY Diane Sawyer is here to discuss her progress with her obesity treatment plan. She is on the Category 2 plan and is following her eating plan approximately 60 to 70 % of the time. She states she is walking 30 minutes 5 times per week. Diane Sawyer is doing well. She has eaten a few things that are not on the plan. She has gotten back on track. Her water weight is increased about 6 pounds. Her weight is 182 lb (82.6 kg) today and has had a weight gain of 2 pounds over a period of 2 weeks since her last visit. She has lost 14 lbs since starting treatment with [place].  Vitamin D deficiency Diane Sawyer has a diagnosis of vitamin D deficiency. She is currently taking high dose prescription vit D and denies nausea, vomiting or muscle weakness.  GERD (gastroesophageal reflux disease) Diane Sawyer has GERD and she is currently taking Protonix. Her GERD is controlled and she denies nausea or vomiting. She has occasional GERD.  Hypertension Diane Sawyer is a 72 y.o. female with hypertension. She is currently taking Norvasc. Diane Sawyer denies chest pain or shortness of breath on exertion. She is working weight loss to help control her blood pressure with the goal of decreasing her risk of heart attack and stroke. Diane Sawyer blood pressure is reasonably well controlled.  ALLERGIES: Allergies  Allergen Reactions  . Benazepril Hcl     REACTION: achy- makes her feel strange.  . Levofloxacin Nausea Only  . Mobic [Meloxicam] Other (See Comments)    Felt like chest going to explode    MEDICATIONS: Current Outpatient Medications on File Prior to Visit  Medication Sig Dispense Refill  . acetaminophen (TYLENOL) 500 MG tablet Take 1,000 mg by mouth every 6 (six) hours as needed for mild pain.    [name] ALPRAZolam (XANAX) 0.25 MG tablet Take 1 tablet (0.25 mg total) by mouth 2 (two) times daily as needed for anxiety. 60 tablet 3  . amLODipine (NORVASC) 5 MG tablet  Take 1 tablet (5 mg total) by mouth daily. T 30 tablet 0  . Cholecalciferol 1000 units capsule Take 2 capsules (2,000 Units total) by mouth daily. 100 capsule 11  . clobetasol cream (TEMOVATE) 3.89 % Apply 1 application topically daily as needed (dermatosis). Reported on 05/23/2016    . etodolac (LODINE) 500 MG tablet Take 500 mg by mouth daily.    . fexofenadine (ALLEGRA) 180 MG tablet Take 180 mg by mouth daily.      . IRON PO Take 65 mg by mouth daily.     . methocarbamol (ROBAXIN) 500 MG tablet Take 1 tablet (500 mg total) by mouth every 8 (eight) hours as needed for muscle spasms. 60 tablet 1  . pantoprazole (PROTONIX) 40 MG tablet Take 1 tablet (40 mg total) by mouth daily. 30 tablet 0  . timolol (BETIMOL) 0.25 % ophthalmic solution Place 1 drop into the right eye daily.     . Vitamin D, Ergocalciferol, (DRISDOL) 1.25 MG (50000 UT) CAPS capsule Take 1 capsule (50,000 Units total) by mouth every 7 (seven) days. 4 capsule 0   No current facility-administered medications on file prior to visit.     PAST MEDICAL HISTORY: Past Medical History:  Diagnosis Date  . Allergic rhinitis   . Anemia   . Anxiety   . Back pain   . Diverticulosis   . GERD (gastroesophageal reflux disease)   .  Glaucoma   . Hypertension   . Lumbar compression fracture ([place]) 09/07/2016   x 2   . Menopause   . Vitamin D deficiency     PAST SURGICAL HISTORY: Past Surgical History:  Procedure Laterality Date  . CATARACT EXTRACTION, BILATERAL  04/06/2017   with cypass stent implanted  . NO PAST SURGERIES      SOCIAL HISTORY: Social History   Tobacco Use  . Smoking status: Never Smoker  . Smokeless tobacco: Never Used  Substance Use Topics  . Alcohol use: No  . Drug use: No    FAMILY HISTORY: Family History  Problem Relation Age of Onset  . Stroke Mother 80  . Hypertension Mother   . Sudden death Mother   . Anxiety disorder Mother   . Lung cancer Father 60  . Colon cancer Neg Hx      ROS: Review of Systems  Constitutional: Negative for weight loss.  Respiratory: Negative for shortness of breath (on exertion).   Cardiovascular: Negative for chest pain.  Gastrointestinal: Negative for nausea and vomiting.  Musculoskeletal:       Negative for muscle weakness    PHYSICAL EXAM: Blood pressure 138/83, pulse 78, temperature 98 F (36.7 C), temperature source Oral, height 5\' 3"  (1.6 m), weight 182 lb (82.6 kg), SpO2 98 %. Body mass index is 32.24 kg/m. Physical Exam  Constitutional: She is oriented to person, place, and time. She appears well-developed and well-nourished.  Cardiovascular: Normal rate.  Pulmonary/Chest: Effort normal.  Musculoskeletal: Normal range of motion.  Neurological: She is oriented to person, place, and time.  Skin: Skin is warm and dry.  Psychiatric: She has a normal mood and affect. Her behavior is normal.  Vitals reviewed.   RECENT LABS AND TESTS: BMET    Component Value Date/Time   NA 144 09/25/2018 1004   K 5.2 09/25/2018 1004   CL 105 09/25/2018 1004   CO2 25 09/25/2018 1004   GLUCOSE 91 09/25/2018 1004   GLUCOSE 107 (H) 06/17/2018 1251   BUN 16 09/25/2018 1004   CREATININE 0.79 09/25/2018 1004   CALCIUM 9.2 09/25/2018 1004   GFRNONAA 76 09/25/2018 1004   GFRAA 87 09/25/2018 1004   Lab Results  Component Value Date   HGBA1C 5.6 09/25/2018   HGBA1C 5.8 (H) 06/20/2018   HGBA1C 5.8 (H) 12/25/2017   Lab Results  Component Value Date   INSULIN 7.1 09/25/2018   INSULIN 9.0 12/25/2017   CBC    Component Value Date/Time   WBC 7.3 06/17/2018 1251   RBC 4.40 06/17/2018 1251   HGB 13.2 06/17/2018 1251   HGB 14.0 12/25/2017 1021   HCT 42.0 06/17/2018 1251   HCT 41.5 12/25/2017 1021   PLT 210 06/17/2018 1251   MCV 95.5 06/17/2018 1251   MCV 88 12/25/2017 1021   MCH 30.0 06/17/2018 1251   MCHC 31.4 06/17/2018 1251   RDW 12.9 06/17/2018 1251   RDW 13.7 12/25/2017 1021   LYMPHSABS 1.4 12/25/2017 1021   MONOABS 0.5  06/12/2017 1111   EOSABS 0.1 12/25/2017 1021   BASOSABS 0.0 12/25/2017 1021   Iron/TIBC/Ferritin/ %Sat    Component Value Date/Time   IRON 82 05/23/2016 1005   IRONPCTSAT 24.9 05/23/2016 1005   Lipid Panel     Component Value Date/Time   CHOL 157 06/20/2018 0859   TRIG 81 06/20/2018 0859   HDL 58 06/20/2018 0859   CHOLHDL 3 06/12/2017 1111   VLDL 23.6 06/12/2017 1111   LDLCALC 83 06/20/2018 0859  Hepatic Function Panel     Component Value Date/Time   PROT 6.8 09/25/2018 1004   ALBUMIN 4.7 09/25/2018 1004   AST 16 09/25/2018 1004   ALT 11 09/25/2018 1004   ALKPHOS 47 09/25/2018 1004   BILITOT 0.4 09/25/2018 1004   BILIDIR 0.0 06/12/2017 1111      Component Value Date/Time   TSH 1.270 12/25/2017 1021   TSH 0.75 06/12/2017 1111   TSH 0.87 11/09/2015 0940    Ref. Range 09/25/2018 10:04  Vitamin D, 25-Hydroxy Latest Ref Range: 30.0 - 100.0 ng/mL 38.5   ASSESSMENT AND PLAN: Vitamin D deficiency  Gastroesophageal reflux disease, esophagitis presence not specified  Essential hypertension  Class 1 obesity with serious comorbidity and body mass index (BMI) of 32.0 to 32.9 in adult, unspecified obesity type  PLAN:  Vitamin D Deficiency Diane Sawyer was informed that low vitamin D levels contributes to fatigue and are associated with obesity, breast, and colon cancer. She agrees to continue to take prescription Vit D @50 ,000 IU every week and will follow up for routine testing of vitamin D, at least 2-3 times per year. She was informed of the risk of over-replacement of vitamin D and agrees to not increase her dose unless she discusses this with [place] first.  GERD (gastroesophageal reflux disease) Diane Sawyer will take Pepto Bismol occasionally (not on a regular basis) or Tums and will follow up with our clinic in 2 weeks.  Hypertension We discussed sodium restriction, working on healthy weight loss, and a regular exercise program as the means to achieve improved blood pressure  control. Diane Sawyer agreed with this plan and agreed to follow up as directed. We will continue to monitor her blood pressure as well as her progress with the above lifestyle modifications. She will continue her medications as prescribed and will watch for signs of hypotension as she continues her lifestyle modifications.  Obesity Diane Sawyer is currently in the action stage of change. As such, her goal is to continue with weight loss efforts She has agreed to follow the Category 2 plan Diane Sawyer has been instructed to work up to a goal of 150 minutes of combined cardio and strengthening exercise per week or resistance band exercises (bands given) for weight loss and overall health benefits. We discussed the following Behavioral Modification Strategies today: increase H2O intake, increasing lean protein intake, decreasing simple carbohydrates , increasing vegetables and holiday eating strategies   Diane Sawyer will continue to drink adequate water and she will work on meal planning.  Diane Sawyer has agreed to follow up with our clinic in 2 weeks. She was informed of the importance of frequent follow up visits to maximize her success with intensive lifestyle modifications for her multiple health conditions.   OBESITY BEHAVIORAL INTERVENTION VISIT  Today's visit was # 18   Starting weight: 196 lbs Starting date: 12/25/2017 Today's weight : 182 lbs Today's date: 11/14/2018 Total lbs lost to date: 14 At least 15 minutes were spent on discussing the following behavioral intervention visit.   ASK: We discussed the diagnosis of obesity with Gladis Riffle today and Diane Sawyer agreed to give [place] permission to discuss obesity behavioral modification therapy today.  ASSESS: Diane Sawyer has the diagnosis of obesity and her BMI today is 32.25 Diane Sawyer is in the action stage of change   ADVISE: Diane Sawyer was educated on the multiple health risks of obesity as well as the benefit of weight loss to improve her health. She was advised of the need  for long term treatment and the  importance of lifestyle modifications to improve her current health and to decrease her risk of future health problems.  AGREE: Multiple dietary modification options and treatment options were discussed and  Diane Sawyer agreed to follow the recommendations documented in the above note.  ARRANGE: Diane Sawyer was educated on the importance of frequent visits to treat obesity as outlined per CMS and USPSTF guidelines and agreed to schedule her next follow up appointment today.  Corey Skains, am acting as Location manager for [employer]. Owens Shark, DO  I have reviewed the above documentation for accuracy and completeness, and I agree with the above. -Jearld Lesch, DO

## 2018-11-15 ENCOUNTER — Other Ambulatory Visit (INDEPENDENT_AMBULATORY_CARE_PROVIDER_SITE_OTHER): Payer: Self-pay | Admitting: Bariatrics

## 2018-11-15 DIAGNOSIS — E559 Vitamin D deficiency, unspecified: Secondary | ICD-10-CM

## 2018-11-28 ENCOUNTER — Other Ambulatory Visit (INDEPENDENT_AMBULATORY_CARE_PROVIDER_SITE_OTHER): Payer: Self-pay | Admitting: Bariatrics

## 2018-11-28 DIAGNOSIS — I10 Essential (primary) hypertension: Secondary | ICD-10-CM

## 2018-12-06 ENCOUNTER — Ambulatory Visit (INDEPENDENT_AMBULATORY_CARE_PROVIDER_SITE_OTHER): Payer: Medicare Other | Admitting: Bariatrics

## 2018-12-06 ENCOUNTER — Encounter (INDEPENDENT_AMBULATORY_CARE_PROVIDER_SITE_OTHER): Payer: Self-pay | Admitting: Bariatrics

## 2018-12-06 VITALS — BP 143/84 | HR 74 | Temp 97.9°F | Ht 63.0 in | Wt 183.0 lb

## 2018-12-06 DIAGNOSIS — E669 Obesity, unspecified: Secondary | ICD-10-CM

## 2018-12-06 DIAGNOSIS — I10 Essential (primary) hypertension: Secondary | ICD-10-CM

## 2018-12-06 DIAGNOSIS — E559 Vitamin D deficiency, unspecified: Secondary | ICD-10-CM

## 2018-12-06 DIAGNOSIS — Z6832 Body mass index (BMI) 32.0-32.9, adult: Secondary | ICD-10-CM | POA: Diagnosis not present

## 2018-12-06 NOTE — Progress Notes (Signed)
Office: [phone]  /  Fax: [phone]   HPI:   Chief Complaint: OBESITY Diane Sawyer is here to discuss her progress with her obesity treatment plan. She is on the Category 2 plan and is following her eating plan approximately 60 % of the time. She states she is walking 30 minutes 5 times per week. Diane Sawyer is doing well overall. She has struggled slightly during the holidays (increased desserts). Her weight is 183 lb (83 kg) today and has had a weight gain of 1 pound over a period of 3 weeks since her last visit. She has lost 13 lbs since starting treatment with [place].  Hypertension Diane Sawyer is a 73 y.o. female with hypertension. She is currently taking Amlodipine. Diane Sawyer denies chest pain or shortness of breath on exertion. She is working weight loss to help control her blood pressure with the goal of decreasing her risk of heart attack and stroke. Diane Sawyer blood pressure is reasonably well controlled.  Vitamin D deficiency Diane Sawyer has a diagnosis of vitamin D deficiency. She is currently taking high dose prescription vit D and denies nausea, vomiting or muscle weakness.  ASSESSMENT AND PLAN:  Essential hypertension  Vitamin D deficiency  Class 1 obesity with serious comorbidity and body mass index (BMI) of 32.0 to 32.9 in adult, unspecified obesity type  PLAN:  Hypertension We discussed sodium restriction, working on healthy weight loss, and a regular exercise program as the means to achieve improved blood pressure control. Diane Sawyer agreed with this plan and agreed to follow up as directed. We will continue to monitor her blood pressure as well as her progress with the above lifestyle modifications. She will continue her medications as prescribed and will watch for signs of hypotension as she continues her lifestyle modifications.  Vitamin D Deficiency Diane Sawyer was informed that low vitamin D levels contributes to fatigue and are associated with obesity, breast, and colon cancer. She  agrees to continue to take prescription Vit D @50 ,000 IU every week and will follow up for routine testing of vitamin D, at least 2-3 times per year. She was informed of the risk of over-replacement of vitamin D and agrees to not increase her dose unless she discusses this with [place] first.  Obesity Diane Sawyer is currently in the action stage of change. As such, her goal is to continue with weight loss efforts She has agreed to follow the Category 2 plan Diane Sawyer has been instructed to work up to a goal of 150 minutes of combined cardio and strengthening exercise per week for weight loss and overall health benefits. We discussed the following Behavioral Modification Strategies today: increase H2O intake, increasing lean protein intake, decreasing simple carbohydrates , increasing vegetables and work on meal planning and easy cooking plans  Handouts for store bought seasonings and homemade seasonings were provided to patient today.  Diane Sawyer has agreed to follow up with our clinic in 2 weeks. She was informed of the importance of frequent follow up visits to maximize her success with intensive lifestyle modifications for her multiple health conditions.  ALLERGIES: Allergies  Allergen Reactions  . Benazepril Hcl     REACTION: achy- makes her feel strange.  . Levofloxacin Nausea Only  . Mobic [Meloxicam] Other (See Comments)    Felt like chest going to explode    MEDICATIONS: Current Outpatient Medications on File Prior to Visit  Medication Sig Dispense Refill  . acetaminophen (TYLENOL) 500 MG tablet Take 1,000 mg by mouth every 6 (six) hours as needed  for mild pain.    [name] ALPRAZolam (XANAX) 0.25 MG tablet Take 1 tablet (0.25 mg total) by mouth 2 (two) times daily as needed for anxiety. 60 tablet 3  . amLODipine (NORVASC) 5 MG tablet TAKE 1 TABLET(5 MG) BY MOUTH DAILY 7 tablet 0  . Cholecalciferol 1000 units capsule Take 2 capsules (2,000 Units total) by mouth daily. 100 capsule 11  . clobetasol cream  (TEMOVATE) 2.99 % Apply 1 application topically daily as needed (dermatosis). Reported on 05/23/2016    . etodolac (LODINE) 500 MG tablet Take 500 mg by mouth daily.    . fexofenadine (ALLEGRA) 180 MG tablet Take 180 mg by mouth daily.      . IRON PO Take 65 mg by mouth daily.     . methocarbamol (ROBAXIN) 500 MG tablet Take 1 tablet (500 mg total) by mouth every 8 (eight) hours as needed for muscle spasms. 60 tablet 1  . pantoprazole (PROTONIX) 40 MG tablet Take 1 tablet (40 mg total) by mouth daily. 30 tablet 0  . timolol (BETIMOL) 0.25 % ophthalmic solution Place 1 drop into the right eye daily.     . Vitamin D, Ergocalciferol, (DRISDOL) 1.25 MG (50000 UT) CAPS capsule TAKE 1 CAPSULE BY MOUTH EVERY 7 DAYS 4 capsule 0   No current facility-administered medications on file prior to visit.     PAST MEDICAL HISTORY: Past Medical History:  Diagnosis Date  . Allergic rhinitis   . Anemia   . Anxiety   . Back pain   . Diverticulosis   . GERD (gastroesophageal reflux disease)   . Glaucoma   . Hypertension   . Lumbar compression fracture ([place]) 09/07/2016   x 2   . Menopause   . Vitamin D deficiency     PAST SURGICAL HISTORY: Past Surgical History:  Procedure Laterality Date  . CATARACT EXTRACTION, BILATERAL  04/06/2017   with cypass stent implanted  . NO PAST SURGERIES      SOCIAL HISTORY: Social History   Tobacco Use  . Smoking status: Never Smoker  . Smokeless tobacco: Never Used  Substance Use Topics  . Alcohol use: No  . Drug use: No    FAMILY HISTORY: Family History  Problem Relation Age of Onset  . Stroke Mother 87  . Hypertension Mother   . Sudden death Mother   . Anxiety disorder Mother   . Lung cancer Father 51  . Colon cancer Neg Hx     ROS: Review of Systems  Constitutional: Negative for weight loss.  Respiratory: Negative for shortness of breath (on exertion).   Cardiovascular: Negative for chest pain.  Gastrointestinal: Negative for nausea and  vomiting.  Musculoskeletal:       Negative for muscle weakness    PHYSICAL EXAM: Blood pressure (!) 143/84, pulse 74, temperature 97.9 F (36.6 C), temperature source Oral, height 5\' 3"  (1.6 m), weight 183 lb (83 kg), SpO2 99 %. Body mass index is 32.42 kg/m. Physical Exam Vitals signs reviewed.  Constitutional:      Appearance: Normal appearance. She is well-developed. She is obese.  Cardiovascular:     Rate and Rhythm: Normal rate.  Pulmonary:     Effort: Pulmonary effort is normal.  Musculoskeletal: Normal range of motion.  Skin:    General: Skin is warm and dry.  Neurological:     Mental Status: She is alert and oriented to person, place, and time.  Psychiatric:        Mood and Affect: Mood normal.  Behavior: Behavior normal.     RECENT LABS AND TESTS: BMET    Component Value Date/Time   NA 144 09/25/2018 1004   K 5.2 09/25/2018 1004   CL 105 09/25/2018 1004   CO2 25 09/25/2018 1004   GLUCOSE 91 09/25/2018 1004   GLUCOSE 107 (H) 06/17/2018 1251   BUN 16 09/25/2018 1004   CREATININE 0.79 09/25/2018 1004   CALCIUM 9.2 09/25/2018 1004   GFRNONAA 76 09/25/2018 1004   GFRAA 87 09/25/2018 1004   Lab Results  Component Value Date   HGBA1C 5.6 09/25/2018   HGBA1C 5.8 (H) 06/20/2018   HGBA1C 5.8 (H) 12/25/2017   Lab Results  Component Value Date   INSULIN 7.1 09/25/2018   INSULIN 9.0 12/25/2017   CBC    Component Value Date/Time   WBC 7.3 06/17/2018 1251   RBC 4.40 06/17/2018 1251   HGB 13.2 06/17/2018 1251   HGB 14.0 12/25/2017 1021   HCT 42.0 06/17/2018 1251   HCT 41.5 12/25/2017 1021   PLT 210 06/17/2018 1251   MCV 95.5 06/17/2018 1251   MCV 88 12/25/2017 1021   MCH 30.0 06/17/2018 1251   MCHC 31.4 06/17/2018 1251   RDW 12.9 06/17/2018 1251   RDW 13.7 12/25/2017 1021   LYMPHSABS 1.4 12/25/2017 1021   MONOABS 0.5 06/12/2017 1111   EOSABS 0.1 12/25/2017 1021   BASOSABS 0.0 12/25/2017 1021   Iron/TIBC/Ferritin/ %Sat    Component Value  Date/Time   IRON 82 05/23/2016 1005   IRONPCTSAT 24.9 05/23/2016 1005   Lipid Panel     Component Value Date/Time   CHOL 157 06/20/2018 0859   TRIG 81 06/20/2018 0859   HDL 58 06/20/2018 0859   CHOLHDL 3 06/12/2017 1111   VLDL 23.6 06/12/2017 1111   LDLCALC 83 06/20/2018 0859   Hepatic Function Panel     Component Value Date/Time   PROT 6.8 09/25/2018 1004   ALBUMIN 4.7 09/25/2018 1004   AST 16 09/25/2018 1004   ALT 11 09/25/2018 1004   ALKPHOS 47 09/25/2018 1004   BILITOT 0.4 09/25/2018 1004   BILIDIR 0.0 06/12/2017 1111      Component Value Date/Time   TSH 1.270 12/25/2017 1021   TSH 0.75 06/12/2017 1111   TSH 0.87 11/09/2015 0940   Results for EVOLET, SALMINEN (MRN [id number]) as of 12/06/2018 07:52  Ref. Range 09/25/2018 10:04  Vitamin D, 25-Hydroxy Latest Ref Range: 30.0 - 100.0 ng/mL 38.5     OBESITY BEHAVIORAL INTERVENTION VISIT  Today's visit was # 19   Starting weight: 196 lbs Starting date: 12/25/2017 Today's weight : 183 lbs Today's date: 12/06/2018 Total lbs lost to date: 13 At least 15 minutes were spent on discussing the following behavioral intervention visit.   ASK: We discussed the diagnosis of obesity with Diane Sawyer today and Diane Sawyer agreed to give [place] permission to discuss obesity behavioral modification therapy today.  ASSESS: Diane Sawyer has the diagnosis of obesity and her BMI today is 32.42 Diane Sawyer is in the action stage of change   ADVISE: Diane Sawyer was educated on the multiple health risks of obesity as well as the benefit of weight loss to improve her health. She was advised of the need for long term treatment and the importance of lifestyle modifications to improve her current health and to decrease her risk of future health problems.  AGREE: Multiple dietary modification options and treatment options were discussed and  Diane Sawyer agreed to follow the recommendations documented in the above note.  ARRANGE:  Diane Sawyer was educated on the importance of  frequent visits to treat obesity as outlined per CMS and USPSTF guidelines and agreed to schedule her next follow up appointment today.  Corey Skains, am acting as Location manager for [employer]. Owens Shark, DO  I have reviewed the above documentation for accuracy and completeness, and I agree with the above. -Jearld Lesch, DO

## 2018-12-09 ENCOUNTER — Encounter: Payer: Self-pay | Admitting: Gastroenterology

## 2018-12-10 ENCOUNTER — Other Ambulatory Visit: Payer: Self-pay | Admitting: Internal Medicine

## 2018-12-10 DIAGNOSIS — Z1231 Encounter for screening mammogram for malignant neoplasm of breast: Secondary | ICD-10-CM

## 2018-12-20 ENCOUNTER — Ambulatory Visit (INDEPENDENT_AMBULATORY_CARE_PROVIDER_SITE_OTHER): Payer: Medicare Other | Admitting: Family Medicine

## 2018-12-20 VITALS — BP 114/74 | HR 72 | Temp 97.8°F | Ht 63.0 in | Wt 181.0 lb

## 2018-12-20 DIAGNOSIS — E559 Vitamin D deficiency, unspecified: Secondary | ICD-10-CM

## 2018-12-20 DIAGNOSIS — Z6832 Body mass index (BMI) 32.0-32.9, adult: Secondary | ICD-10-CM

## 2018-12-20 DIAGNOSIS — E669 Obesity, unspecified: Secondary | ICD-10-CM | POA: Diagnosis not present

## 2018-12-21 NOTE — Progress Notes (Signed)
Office: [phone]  /  Fax: [phone]   HPI:   Chief Complaint: OBESITY Diane Sawyer is here to discuss her progress with her obesity treatment plan. She is on the Category 2 plan and is following her eating plan approximately 80% of the time. She states she is walking for 30 minutes 7 times per week. Diane Sawyer eats out a few times per month, and makes smart choices and uses portion control. She notes no polyphagia if she eats right amount of protein. Her weight is 181 lb (82.1 kg) today and has had a weight loss of 2 pounds over a period of 2 weeks since her last visit. She has lost 15 lbs since starting treatment with [place].  Vitamin D Deficiency Diane Sawyer has a diagnosis of vitamin D deficiency. She is currently taking prescription Vit D and denies nausea, vomiting or muscle weakness. Not at goal. Last vitamin D was 38.5 on 09/25/18.  ALLERGIES: Allergies  Allergen Reactions  . Benazepril Hcl     REACTION: achy- makes her feel strange.  . Levofloxacin Nausea Only  . Mobic [Meloxicam] Other (See Comments)    Felt like chest going to explode    MEDICATIONS: Current Outpatient Medications on File Prior to Visit  Medication Sig Dispense Refill  . acetaminophen (TYLENOL) 500 MG tablet Take 1,000 mg by mouth every 6 (six) hours as needed for mild pain.    [name] ALPRAZolam (XANAX) 0.25 MG tablet Take 1 tablet (0.25 mg total) by mouth 2 (two) times daily as needed for anxiety. 60 tablet 3  . amLODipine (NORVASC) 5 MG tablet TAKE 1 TABLET(5 MG) BY MOUTH DAILY 7 tablet 0  . Cholecalciferol 1000 units capsule Take 2 capsules (2,000 Units total) by mouth daily. 100 capsule 11  . clobetasol cream (TEMOVATE) 4.96 % Apply 1 application topically daily as needed (dermatosis). Reported on 05/23/2016    . etodolac (LODINE) 500 MG tablet Take 500 mg by mouth daily.    . fexofenadine (ALLEGRA) 180 MG tablet Take 180 mg by mouth daily.      . IRON PO Take 65 mg by mouth daily.     . methocarbamol (ROBAXIN) 500 MG  tablet Take 1 tablet (500 mg total) by mouth every 8 (eight) hours as needed for muscle spasms. 60 tablet 1  . pantoprazole (PROTONIX) 40 MG tablet Take 1 tablet (40 mg total) by mouth daily. 30 tablet 0  . timolol (BETIMOL) 0.25 % ophthalmic solution Place 1 drop into the right eye daily.     . Vitamin D, Ergocalciferol, (DRISDOL) 1.25 MG (50000 UT) CAPS capsule TAKE 1 CAPSULE BY MOUTH EVERY 7 DAYS 4 capsule 0   No current facility-administered medications on file prior to visit.     PAST MEDICAL HISTORY: Past Medical History:  Diagnosis Date  . Allergic rhinitis   . Anemia   . Anxiety   . Back pain   . Diverticulosis   . GERD (gastroesophageal reflux disease)   . Glaucoma   . Hypertension   . Lumbar compression fracture ([place]) 09/07/2016   x 2   . Menopause   . Vitamin D deficiency     PAST SURGICAL HISTORY: Past Surgical History:  Procedure Laterality Date  . CATARACT EXTRACTION, BILATERAL  04/06/2017   with cypass stent implanted  . NO PAST SURGERIES      SOCIAL HISTORY: Social History   Tobacco Use  . Smoking status: Never Smoker  . Smokeless tobacco: Never Used  Substance Use Topics  . Alcohol  use: No  . Drug use: No    FAMILY HISTORY: Family History  Problem Relation Age of Onset  . Stroke Mother 3  . Hypertension Mother   . Sudden death Mother   . Anxiety disorder Mother   . Lung cancer Father 39  . Colon cancer Neg Hx     ROS: Review of Systems  Constitutional: Positive for weight loss.  Gastrointestinal: Negative for nausea and vomiting.  Musculoskeletal:       Negative muscle weakness  Endo/Heme/Allergies:       Positive polyphagia    PHYSICAL EXAM: Blood pressure 114/74, pulse 72, temperature 97.8 F (36.6 C), temperature source Oral, height 5\' 3"  (1.6 m), weight 181 lb (82.1 kg), SpO2 98 %. Body mass index is 32.06 kg/m. Physical Exam Vitals signs reviewed.  Constitutional:      Appearance: Normal appearance. She is obese.    Cardiovascular:     Rate and Rhythm: Normal rate.     Pulses: Normal pulses.  Pulmonary:     Effort: Pulmonary effort is normal.     Breath sounds: Normal breath sounds.  Musculoskeletal: Normal range of motion.  Skin:    General: Skin is warm and dry.  Neurological:     Mental Status: She is alert and oriented to person, place, and time.  Psychiatric:        Mood and Affect: Mood normal.        Behavior: Behavior normal.     RECENT LABS AND TESTS: BMET    Component Value Date/Time   NA 144 09/25/2018 1004   K 5.2 09/25/2018 1004   CL 105 09/25/2018 1004   CO2 25 09/25/2018 1004   GLUCOSE 91 09/25/2018 1004   GLUCOSE 107 (H) 06/17/2018 1251   BUN 16 09/25/2018 1004   CREATININE 0.79 09/25/2018 1004   CALCIUM 9.2 09/25/2018 1004   GFRNONAA 76 09/25/2018 1004   GFRAA 87 09/25/2018 1004   Lab Results  Component Value Date   HGBA1C 5.6 09/25/2018   HGBA1C 5.8 (H) 06/20/2018   HGBA1C 5.8 (H) 12/25/2017   Lab Results  Component Value Date   INSULIN 7.1 09/25/2018   INSULIN 9.0 12/25/2017   CBC    Component Value Date/Time   WBC 7.3 06/17/2018 1251   RBC 4.40 06/17/2018 1251   HGB 13.2 06/17/2018 1251   HGB 14.0 12/25/2017 1021   HCT 42.0 06/17/2018 1251   HCT 41.5 12/25/2017 1021   PLT 210 06/17/2018 1251   MCV 95.5 06/17/2018 1251   MCV 88 12/25/2017 1021   MCH 30.0 06/17/2018 1251   MCHC 31.4 06/17/2018 1251   RDW 12.9 06/17/2018 1251   RDW 13.7 12/25/2017 1021   LYMPHSABS 1.4 12/25/2017 1021   MONOABS 0.5 06/12/2017 1111   EOSABS 0.1 12/25/2017 1021   BASOSABS 0.0 12/25/2017 1021   Iron/TIBC/Ferritin/ %Sat    Component Value Date/Time   IRON 82 05/23/2016 1005   IRONPCTSAT 24.9 05/23/2016 1005   Lipid Panel     Component Value Date/Time   CHOL 157 06/20/2018 0859   TRIG 81 06/20/2018 0859   HDL 58 06/20/2018 0859   CHOLHDL 3 06/12/2017 1111   VLDL 23.6 06/12/2017 1111   LDLCALC 83 06/20/2018 0859   Hepatic Function Panel     Component  Value Date/Time   PROT 6.8 09/25/2018 1004   ALBUMIN 4.7 09/25/2018 1004   AST 16 09/25/2018 1004   ALT 11 09/25/2018 1004   ALKPHOS 47 09/25/2018 1004  BILITOT 0.4 09/25/2018 1004   BILIDIR 0.0 06/12/2017 1111      Component Value Date/Time   TSH 1.270 12/25/2017 1021   TSH 0.75 06/12/2017 1111   TSH 0.87 11/09/2015 0940    ASSESSMENT AND PLAN: Vitamin D deficiency  Class 1 obesity with serious comorbidity and body mass index (BMI) of 32.0 to 32.9 in adult, unspecified obesity type  PLAN:  Vitamin D Deficiency Diane Sawyer was informed that low vitamin D levels contributes to fatigue and are associated with obesity, breast, and colon cancer. Diane Sawyer agrees to continue taking prescription Vit D @50 ,000 IU every week and will follow up for routine testing of vitamin D, at least 2-3 times per year. She was informed of the risk of over-replacement of vitamin D and agrees to not increase her dose unless she discusses this with [place] first. Diane Sawyer agrees to follow up with our clinic in 2 to 3 weeks.  Obesity Diane Sawyer is currently in the action stage of change. As such, her goal is to continue with weight loss efforts She has agreed to portion control better and make smarter food choices, such as increase vegetables and decrease simple carbohydrates when dining out and follow the Category 2 plan Diane Sawyer will continue current exercise regimen for weight loss and overall health benefits. We discussed the following Behavioral Modification Strategies today: increasing lean protein intake and planning for success   Diane Sawyer has agreed to follow up with our clinic in 2 to 3 weeks. She was informed of the importance of frequent follow up visits to maximize her success with intensive lifestyle modifications for her multiple health conditions.   OBESITY BEHAVIORAL INTERVENTION VISIT  Today's visit was # 20  Starting weight: 196 lbs Starting date: 12/25/17 Today's weight : 181 lbs  Today's date:  12/20/2018 Total lbs lost to date: 15 At least 15 minutes were spent on discussing the following behavioral intervention visit.   ASK: We discussed the diagnosis of obesity with Diane Sawyer today and Diane Sawyer agreed to give [place] permission to discuss obesity behavioral modification therapy today.  ASSESS: Diane Sawyer has the diagnosis of obesity and her BMI today is 32.07 Diane Sawyer is in the action stage of change   ADVISE: Diane Sawyer was educated on the multiple health risks of obesity as well as the benefit of weight loss to improve her health. She was advised of the need for long term treatment and the importance of lifestyle modifications to improve her current health and to decrease her risk of future health problems.  AGREE: Multiple dietary modification options and treatment options were discussed and  Diane Sawyer agreed to follow the recommendations documented in the above note.  ARRANGE: Diane Sawyer was educated on the importance of frequent visits to treat obesity as outlined per CMS and USPSTF guidelines and agreed to schedule her next follow up appointment today.  Diane Sawyer, am acting as Location manager for [employer], FNP-C.  I have reviewed the above documentation for accuracy and completeness, and I agree with the above.  - Roshawn Lacina, FNP-C.

## 2018-12-25 ENCOUNTER — Encounter (INDEPENDENT_AMBULATORY_CARE_PROVIDER_SITE_OTHER): Payer: Self-pay | Admitting: Family Medicine

## 2018-12-28 NOTE — Progress Notes (Addendum)
Subjective:   Diane Sawyer is a 73 y.o. female who presents for Medicare Annual (Subsequent) preventive examination.  Review of Systems:  No ROS.  Medicare Wellness Visit. Additional risk factors are reflected in the social history.  Cardiac Risk Factors include: advanced age (>41men, >16 women);dyslipidemia;hypertension Sleep patterns: feels rested on waking, gets up 1 times nightly to void and sleeps 6-7 hours nightly.    Home Safety/Smoke Alarms: Feels safe in home. Smoke alarms in place.  Living environment; residence and Firearm Safety: 1-story house/ trailer, no firearms Lives with husband, no needs for DME, good support system. Seat Belt Safety/Bike Helmet: Wears seat belt.     Objective:     Vitals: BP 134/72   Pulse 99   Temp 98.1 F (36.7 C)   Resp 17   Ht 5\' 3"  (1.6 m)   Wt 186 lb (84.4 kg)   SpO2 99%   BMI 32.95 kg/m   Body mass index is 32.95 kg/m.  Advanced Directives 12/31/2018 10/01/2016  Does Patient Have a Medical Advance Directive? No No  Does patient want to make changes to medical advance directive? Yes (ED - Information included in AVS) -  Would patient like information on creating a medical advance directive? - No - patient declined information    Tobacco Social History   Tobacco Use  Smoking Status Never Smoker  Smokeless Tobacco Never Used     Counseling given: Not Answered  Past Medical History:  Diagnosis Date  . Allergic rhinitis   . Anemia   . Anxiety   . Back pain   . Diverticulosis   . GERD (gastroesophageal reflux disease)   . Glaucoma   . Hypertension   . Lumbar compression fracture ([place]) 09/07/2016   x 2   . Menopause   . Vitamin D deficiency    Past Surgical History:  Procedure Laterality Date  . CATARACT EXTRACTION, BILATERAL  04/06/2017   with cypass stent implanted  . NO PAST SURGERIES     Family History  Problem Relation Age of Onset  . Stroke Mother 49  . Hypertension Mother   . Sudden death Mother   .  Anxiety disorder Mother   . Lung cancer Father 28  . Colon cancer Neg Hx    Social History   Socioeconomic History  . Marital status: Married    Spouse name: Diane Sawyer  . Number of children: 2  . Years of education: Not on file  . Highest education level: Not on file  Occupational History  . Occupation: Hair dresser  Social Needs  . Financial resource strain: Not hard at all  . Food insecurity:    Worry: Never true    Inability: Never true  . Transportation needs:    Medical: No    Non-medical: No  Tobacco Use  . Smoking status: Never Smoker  . Smokeless tobacco: Never Used  Substance and Sexual Activity  . Alcohol use: No  . Drug use: No  . Sexual activity: Yes    Birth control/protection: None  Lifestyle  . Physical activity:    Days per week: 0 days    Minutes per session: 0 min  . Stress: Not at all  Relationships  . Social connections:    Talks on phone: More than three times a week    Gets together: More than three times a week    Attends religious service: More than 4 times per year    Active member of club or  organization: Yes    Attends meetings of clubs or organizations: More than 4 times per year    Relationship status: Married  Other Topics Concern  . Not on file  Social History Narrative  . Not on file    Outpatient Encounter Medications as of 12/31/2018  Medication Sig  . acetaminophen (TYLENOL) 500 MG tablet Take 1,000 mg by mouth every 6 (six) hours as needed for mild pain.  [name] ALPRAZolam (XANAX) 0.25 MG tablet Take 1 tablet (0.25 mg total) by mouth 2 (two) times daily as needed for anxiety.  [name] amLODipine (NORVASC) 5 MG tablet TAKE 1 TABLET(5 MG) BY MOUTH DAILY  . Cholecalciferol 1000 units capsule Take 2 capsules (2,000 Units total) by mouth daily.  . clobetasol cream (TEMOVATE) 1.61 % Apply 1 application topically daily as needed (dermatosis). Reported on 05/23/2016  . etodolac (LODINE) 500 MG tablet Take 500 mg by mouth as needed.   .  fexofenadine (ALLEGRA) 180 MG tablet Take 180 mg by mouth daily.    . IRON PO Take 65 mg by mouth daily.   . methocarbamol (ROBAXIN) 500 MG tablet Take 1 tablet (500 mg total) by mouth every 8 (eight) hours as needed for muscle spasms.  . pantoprazole (PROTONIX) 40 MG tablet Take 1 tablet (40 mg total) by mouth daily.  . timolol (BETIMOL) 0.25 % ophthalmic solution Place 1 drop into the right eye daily.   . [DISCONTINUED] Vitamin D, Ergocalciferol, (DRISDOL) 1.25 MG (50000 UT) CAPS capsule TAKE 1 CAPSULE BY MOUTH EVERY 7 DAYS (Patient not taking: Reported on 12/31/2018)   No facility-administered encounter medications on file as of 12/31/2018.     Activities of Daily Living In your present state of health, do you have any difficulty performing the following activities: 12/31/2018  Hearing? N  Vision? N  Difficulty concentrating or making decisions? N  Walking or climbing stairs? N  Dressing or bathing? N  Doing errands, shopping? N  Preparing Food and eating ? N  Using the Toilet? N  In the past six months, have you accidently leaked urine? N  Do you have problems with loss of bowel control? N  Managing your Medications? N  Managing your Finances? N  Housekeeping or managing your Housekeeping? N  Some recent data might be hidden    Patient Care Team: Plotnikov, Evie Lacks, MD as PCP - General Ladene Artist, MD as Attending Physician (Gastroenterology)    Assessment:   This is a routine wellness examination for [name]. Physical assessment deferred to PCP.  Exercise Activities and Dietary recommendations Current Exercise Habits: The patient has a physically strenuous job, but has no regular exercise apart from work.(works full-time as a Emergency planning/management officer), Exercise limited by: orthopedic condition(s)  Diet (meal preparation, eat out, water intake, caffeinated beverages, dairy products, fruits and vegetables): in general, a "healthy" diet  , well balanced   Participating in weight loss  program with Dr. Dennard Nip [facility].  eats a variety of fruits and vegetables daily, limits salt, fat/cholesterol, sugar,carbohydrates,caffeine, drinks 6-8 glasses of water daily.  Goals    . patient     Maintain my current health status, enjoy life and family.       Fall Risk Fall Risk  12/31/2018 12/11/2017 11/21/2016 11/16/2015 11/10/2014  Falls in the past year? 0 No Yes Yes No  Number falls in past yr: 0 - 1 1 -  Injury with Fall? - - Yes No -   Depression Screen PHQ 2/9 Scores 12/31/2018  12/25/2017 12/11/2017 11/21/2016  PHQ - 2 Score 0 1 0 0  PHQ- 9 Score - 5 - -     Cognitive Function       Ad8 score reviewed for issues:  Issues making decisions: no  Less interest in hobbies / activities: no  Repeats questions, stories (family complaining): no  Trouble using ordinary gadgets (microwave, computer, phone):no  Forgets the month or year: no  Mismanaging finances: no  Remembering appts: no  Daily problems with thinking and/or memory: no Ad8 score is= 0  Immunization History  Administered Date(s) Administered  . Influenza Whole 09/01/2008, 08/31/2009, 09/27/2010  . Influenza, High Dose Seasonal PF 09/18/2013, 11/21/2016, 10/17/2017, 12/31/2018  . Influenza, Seasonal, Injecte, Preservative Fre 11/12/2012  . Influenza,inj,Quad PF,6+ Mos 11/10/2014, 11/16/2015  . Pneumococcal Conjugate-13 11/10/2014  . Pneumococcal Polysaccharide-23 11/12/2012  . Td 05/05/2010  . Zoster 12/21/2009   Screening Tests Health Maintenance  Topic Date Due  . Hepatitis C Screening  1946-09-19  . MAMMOGRAM  11/24/2017  . INFLUENZA VACCINE  07/05/2018  . COLONOSCOPY  11/11/2018  . TETANUS/TDAP  05/05/2020  . DEXA SCAN  Completed  . PNA vac Low Risk Adult  Completed      Plan:      Reviewed health maintenance screenings with patient today and relevant education, vaccines, and/or referrals were provided. Mammogram is scheduled for 01/08/19. Patient states she will call Dr.  Fuller Plan to schedule screening colonoscopy.  Continue doing brain stimulating activities (puzzles, reading, adult coloring books, staying active) to keep memory sharp.   Continue to eat heart healthy diet (full of fruits, vegetables, whole grains, lean protein, water--limit salt, fat, and sugar intake) and increase physical activity as tolerated.   I have personally reviewed and noted the following in the patient's chart:   . Medical and social history . Use of alcohol, tobacco or illicit drugs  . Current medications and supplements . Functional ability and status . Nutritional status . Physical activity . Advanced directives . List of other physicians . Vitals . Screenings to include cognitive, depression, and falls . Referrals and appointments  In addition, I have reviewed and discussed with patient certain preventive protocols, quality metrics, and best practice recommendations. A written personalized care plan for preventive services as well as general preventive health recommendations were provided to patient.     Michiel Cowboy, RN  12/31/2018   Medical screening examination/treatment/procedure(s) were performed by non-physician practitioner and as supervising physician I was immediately available for consultation/collaboration. I agree with above. Lew Dawes, MD

## 2018-12-31 ENCOUNTER — Ambulatory Visit (INDEPENDENT_AMBULATORY_CARE_PROVIDER_SITE_OTHER): Payer: Medicare Other | Admitting: *Deleted

## 2018-12-31 ENCOUNTER — Telehealth: Payer: Self-pay

## 2018-12-31 ENCOUNTER — Ambulatory Visit (INDEPENDENT_AMBULATORY_CARE_PROVIDER_SITE_OTHER): Payer: Medicare Other | Admitting: Internal Medicine

## 2018-12-31 ENCOUNTER — Encounter: Payer: Self-pay | Admitting: Internal Medicine

## 2018-12-31 VITALS — BP 134/72 | HR 99 | Temp 98.1°F | Resp 17 | Ht 63.0 in | Wt 186.0 lb

## 2018-12-31 DIAGNOSIS — Z0000 Encounter for general adult medical examination without abnormal findings: Secondary | ICD-10-CM

## 2018-12-31 DIAGNOSIS — R7303 Prediabetes: Secondary | ICD-10-CM

## 2018-12-31 DIAGNOSIS — R739 Hyperglycemia, unspecified: Secondary | ICD-10-CM

## 2018-12-31 DIAGNOSIS — Z23 Encounter for immunization: Secondary | ICD-10-CM | POA: Diagnosis not present

## 2018-12-31 DIAGNOSIS — I10 Essential (primary) hypertension: Secondary | ICD-10-CM | POA: Diagnosis not present

## 2018-12-31 DIAGNOSIS — E559 Vitamin D deficiency, unspecified: Secondary | ICD-10-CM | POA: Diagnosis not present

## 2018-12-31 NOTE — Assessment & Plan Note (Signed)
Labs

## 2018-12-31 NOTE — Patient Instructions (Addendum)
Continue doing brain stimulating activities (puzzles, reading, adult coloring books, staying active) to keep memory sharp.   Continue to eat heart healthy diet (full of fruits, vegetables, whole grains, lean protein, water--limit salt, fat, and sugar intake) and increase physical activity as tolerated.   Diane Sawyer , Thank you for taking time to come for your Medicare Wellness Visit. I appreciate your ongoing commitment to your health goals. Please review the following plan we discussed and let me know if I can assist you in the future.   These are the goals we discussed: Goals    . patient     Maintain my current health status, enjoy life and family.       This is a list of the screening recommended for you and due dates:  Health Maintenance  Topic Date Due  .  Hepatitis C: One time screening is recommended by Center for Disease Control  (CDC) for  adults born from [birth year] through 1964.   11-04-1946  . Mammogram  11/24/2017  . Flu Shot  07/05/2018  . Colon Cancer Screening  11/11/2018  . Tetanus Vaccine  05/05/2020  . DEXA scan (bone density measurement)  Completed  . Pneumonia vaccines  Completed   Influenza Virus Vaccine injection What is this medicine? INFLUENZA VIRUS VACCINE (in floo EN zuh VAHY ruhs vak SEEN) helps to reduce the risk of getting influenza also known as the flu. The vaccine only helps protect you against some strains of the flu. This medicine may be used for other purposes; ask your health care provider or pharmacist if you have questions. COMMON BRAND NAME(S): Afluria, Agriflu, Alfuria, FLUAD, Fluarix, Fluarix Quadrivalent, Flublok, Flublok Quadrivalent, FLUCELVAX, Flulaval, Fluvirin, Fluzone, Fluzone High-Dose, Fluzone Intradermal What should I tell my health care provider before I take this medicine? They need to know if you have any of these conditions: -bleeding disorder like hemophilia -fever or infection -Guillain-Barre syndrome or other neurological  problems -immune system problems -infection with the human immunodeficiency virus (HIV) or AIDS -low blood platelet counts -multiple sclerosis -an unusual or allergic reaction to influenza virus vaccine, latex, other medicines, foods, dyes, or preservatives. Different brands of vaccines contain different allergens. Some may contain latex or eggs. Talk to your doctor about your allergies to make sure that you get the right vaccine. -pregnant or trying to get pregnant -breast-feeding How should I use this medicine? This vaccine is for injection into a muscle or under the skin. It is given by a health care professional. A copy of Vaccine Information Statements will be given before each vaccination. Read this sheet carefully each time. The sheet may change frequently. Talk to your healthcare provider to see which vaccines are right for you. Some vaccines should not be used in all age groups. Overdosage: If you think you have taken too much of this medicine contact a poison control center or emergency room at once. NOTE: This medicine is only for you. Do not share this medicine with others. What if I miss a dose? This does not apply. What may interact with this medicine? -chemotherapy or radiation therapy -medicines that lower your immune system like etanercept, anakinra, infliximab, and adalimumab -medicines that treat or prevent blood clots like warfarin -phenytoin -steroid medicines like prednisone or cortisone -theophylline -vaccines This list may not describe all possible interactions. Give your health care provider a list of all the medicines, herbs, non-prescription drugs, or dietary supplements you use. Also tell them if you smoke, drink alcohol, or use illegal drugs.  Some items may interact with your medicine. What should I watch for while using this medicine? Report any side effects that do not go away within 3 days to your doctor or health care professional. Call your health care  provider if any unusual symptoms occur within 6 weeks of receiving this vaccine. You may still catch the flu, but the illness is not usually as bad. You cannot get the flu from the vaccine. The vaccine will not protect against colds or other illnesses that may cause fever. The vaccine is needed every year. What side effects may I notice from receiving this medicine? Side effects that you should report to your doctor or health care professional as soon as possible: -allergic reactions like skin rash, itching or hives, swelling of the face, lips, or tongue Side effects that usually do not require medical attention (report to your doctor or health care professional if they continue or are bothersome): -fever -headache -muscle aches and pains -pain, tenderness, redness, or swelling at the injection site -tiredness This list may not describe all possible side effects. Call your doctor for medical advice about side effects. You may report side effects to FDA at 1-800-FDA-1088. Where should I keep my medicine? The vaccine will be given by a health care professional in a clinic, pharmacy, doctor's office, or other health care setting. You will not be given vaccine doses to store at home. NOTE: This sheet is a summary. It may not cover all possible information. If you have questions about this medicine, talk to your doctor, pharmacist, or health care provider.  2019 Elsevier/Gold Standard (2015-06-12 10:07:28)  Health Maintenance, Female Adopting a healthy lifestyle and getting preventive care can go a long way to promote health and wellness. Talk with your health care provider about what schedule of regular examinations is right for you. This is a good chance for you to check in with your provider about disease prevention and staying healthy. In between checkups, there are plenty of things you can do on your own. Experts have done a lot of research about which lifestyle changes and preventive measures  are most likely to keep you healthy. Ask your health care provider for more information. Weight and diet Eat a healthy diet  Be sure to include plenty of vegetables, fruits, low-fat dairy products, and lean protein.  Do not eat a lot of foods high in solid fats, added sugars, or salt.  Get regular exercise. This is one of the most important things you can do for your health. ? Most adults should exercise for at least 150 minutes each week. The exercise should increase your heart rate and make you sweat (moderate-intensity exercise). ? Most adults should also do strengthening exercises at least twice a week. This is in addition to the moderate-intensity exercise. Maintain a healthy weight  Body mass index (BMI) is a measurement that can be used to identify possible weight problems. It estimates body fat based on height and weight. Your health care provider can help determine your BMI and help you achieve or maintain a healthy weight.  For females 72 years of age and older: ? A BMI below 18.5 is considered underweight. ? A BMI of 18.5 to 24.9 is normal. ? A BMI of 25 to 29.9 is considered overweight. ? A BMI of 30 and above is considered obese. Watch levels of cholesterol and blood lipids  You should start having your blood tested for lipids and cholesterol at 72 years of age, then have this  test every 5 years.  You may need to have your cholesterol levels checked more often if: ? Your lipid or cholesterol levels are high. ? You are older than 72 years of age. ? You are at high risk for heart disease. Cancer screening Lung Cancer  Lung cancer screening is recommended for adults 43-65 years old who are at high risk for lung cancer because of a history of smoking.  A yearly low-dose CT scan of the lungs is recommended for people who: ? Currently smoke. ? Have quit within the past 15 years. ? Have at least a 30-pack-year history of smoking. A pack year is smoking an average of one pack  of cigarettes a day for 1 year.  Yearly screening should continue until it has been 15 years since you quit.  Yearly screening should stop if you develop a health problem that would prevent you from having lung cancer treatment. Breast Cancer  Practice breast self-awareness. This means understanding how your breasts normally appear and feel.  It also means doing regular breast self-exams. Let your health care provider know about any changes, no matter how small.  If you are in your 20s or 30s, you should have a clinical breast exam (CBE) by a health care provider every 1-3 years as part of a regular health exam.  If you are 53 or older, have a CBE every year. Also consider having a breast X-ray (mammogram) every year.  If you have a family history of breast cancer, talk to your health care provider about genetic screening.  If you are at high risk for breast cancer, talk to your health care provider about having an MRI and a mammogram every year.  Breast cancer gene (BRCA) assessment is recommended for women who have family members with BRCA-related cancers. BRCA-related cancers include: ? Breast. ? Ovarian. ? Tubal. ? Peritoneal cancers.  Results of the assessment will determine the need for genetic counseling and BRCA1 and BRCA2 testing. Cervical Cancer Your health care provider may recommend that you be screened regularly for cancer of the pelvic organs (ovaries, uterus, and vagina). This screening involves a pelvic examination, including checking for microscopic changes to the surface of your cervix (Pap test). You may be encouraged to have this screening done every 3 years, beginning at age 61.  For women ages 89-65, health care providers may recommend pelvic exams and Pap testing every 3 years, or they may recommend the Pap and pelvic exam, combined with testing for human papilloma virus (HPV), every 5 years. Some types of HPV increase your risk of cervical cancer. Testing for HPV  may also be done on women of any age with unclear Pap test results.  Other health care providers may not recommend any screening for nonpregnant women who are considered low risk for pelvic cancer and who do not have symptoms. Ask your health care provider if a screening pelvic exam is right for you.  If you have had past treatment for cervical cancer or a condition that could lead to cancer, you need Pap tests and screening for cancer for at least 20 years after your treatment. If Pap tests have been discontinued, your risk factors (such as having a new sexual partner) need to be reassessed to determine if screening should resume. Some women have medical problems that increase the chance of getting cervical cancer. In these cases, your health care provider may recommend more frequent screening and Pap tests. Colorectal Cancer  This type of cancer can be  detected and often prevented.  Routine colorectal cancer screening usually begins at 72 years of age and continues through 72 years of age.  Your health care provider may recommend screening at an earlier age if you have risk factors for colon cancer.  Your health care provider may also recommend using home test kits to check for hidden blood in the stool.  A small camera at the end of a tube can be used to examine your colon directly (sigmoidoscopy or colonoscopy). This is done to check for the earliest forms of colorectal cancer.  Routine screening usually begins at age 9.  Direct examination of the colon should be repeated every 5-10 years through 72 years of age. However, you may need to be screened more often if early forms of precancerous polyps or small growths are found. Skin Cancer  Check your skin from head to toe regularly.  Tell your health care provider about any new moles or changes in moles, especially if there is a change in a mole's shape or color.  Also tell your health care provider if you have a mole that is larger than  the size of a pencil eraser.  Always use sunscreen. Apply sunscreen liberally and repeatedly throughout the day.  Protect yourself by wearing long sleeves, pants, a wide-brimmed hat, and sunglasses whenever you are outside. Heart disease, diabetes, and high blood pressure  High blood pressure causes heart disease and increases the risk of stroke. High blood pressure is more likely to develop in: ? People who have blood pressure in the high end of the normal range (130-139/85-89 mm Hg). ? People who are overweight or obese. ? People who are African American.  If you are 60-48 years of age, have your blood pressure checked every 3-5 years. If you are 27 years of age or older, have your blood pressure checked every year. You should have your blood pressure measured twice-once when you are at a hospital or clinic, and once when you are not at a hospital or clinic. Record the average of the two measurements. To check your blood pressure when you are not at a hospital or clinic, you can use: ? An automated blood pressure machine at a pharmacy. ? A home blood pressure monitor.  If you are between 62 years and 43 years old, ask your health care provider if you should take aspirin to prevent strokes.  Have regular diabetes screenings. This involves taking a blood sample to check your fasting blood sugar level. ? If you are at a normal weight and have a low risk for diabetes, have this test once every three years after 72 years of age. ? If you are overweight and have a high risk for diabetes, consider being tested at a younger age or more often. Preventing infection Hepatitis B  If you have a higher risk for hepatitis B, you should be screened for this virus. You are considered at high risk for hepatitis B if: ? You were born in a country where hepatitis B is common. Ask your health care provider which countries are considered high risk. ? Your parents were born in a high-risk country, and you have  not been immunized against hepatitis B (hepatitis B vaccine). ? You have HIV or AIDS. ? You use needles to inject street drugs. ? You live with someone who has hepatitis B. ? You have had sex with someone who has hepatitis B. ? You get hemodialysis treatment. ? You take certain medicines for conditions,  including cancer, organ transplantation, and autoimmune conditions. Hepatitis C  Blood testing is recommended for: ? Everyone born from [birth year] through 1965. ? Anyone with known risk factors for hepatitis C. Sexually transmitted infections (STIs)  You should be screened for sexually transmitted infections (STIs) including gonorrhea and chlamydia if: ? You are sexually active and are younger than 72 years of age. ? You are older than 72 years of age and your health care provider tells you that you are at risk for this type of infection. ? Your sexual activity has changed since you were last screened and you are at an increased risk for chlamydia or gonorrhea. Ask your health care provider if you are at risk.  If you do not have HIV, but are at risk, it may be recommended that you take a prescription medicine daily to prevent HIV infection. This is called pre-exposure prophylaxis (PrEP). You are considered at risk if: ? You are sexually active and do not regularly use condoms or know the HIV status of your partner(s). ? You take drugs by injection. ? You are sexually active with a partner who has HIV. Talk with your health care provider about whether you are at high risk of being infected with HIV. If you choose to begin PrEP, you should first be tested for HIV. You should then be tested every 3 months for as long as you are taking PrEP. Pregnancy  If you are premenopausal and you may become pregnant, ask your health care provider about preconception counseling.  If you may become pregnant, take 400 to 800 micrograms (mcg) of folic acid every day.  If you want to prevent pregnancy, talk to  your health care provider about birth control (contraception). Osteoporosis and menopause  Osteoporosis is a disease in which the bones lose minerals and strength with aging. This can result in serious bone fractures. Your risk for osteoporosis can be identified using a bone density scan.  If you are 68 years of age or older, or if you are at risk for osteoporosis and fractures, ask your health care provider if you should be screened.  Ask your health care provider whether you should take a calcium or vitamin D supplement to lower your risk for osteoporosis.  Menopause may have certain physical symptoms and risks.  Hormone replacement therapy may reduce some of these symptoms and risks. Talk to your health care provider about whether hormone replacement therapy is right for you. Follow these instructions at home:  Schedule regular health, dental, and eye exams.  Stay current with your immunizations.  Do not use any tobacco products including cigarettes, chewing tobacco, or electronic cigarettes.  If you are pregnant, do not drink alcohol.  If you are breastfeeding, limit how much and how often you drink alcohol.  Limit alcohol intake to no more than 1 drink per day for nonpregnant women. One drink equals 12 ounces of beer, 5 ounces of wine, or 1 ounces of hard liquor.  Do not use street drugs.  Do not share needles.  Ask your health care provider for help if you need support or information about quitting drugs.  Tell your health care provider if you often feel depressed.  Tell your health care provider if you have ever been abused or do not feel safe at home. This information is not intended to replace advice given to you by your health care provider. Make sure you discuss any questions you have with your health care provider. Document Released: 06/06/2011 Document  Revised: 04/28/2016 Document Reviewed: 08/25/2015 Elsevier Interactive Patient Education  2019 [employer]. B

## 2018-12-31 NOTE — Assessment & Plan Note (Addendum)
Offered cardiac CT scan for calcium scoring 

## 2018-12-31 NOTE — Assessment & Plan Note (Signed)
Vit D 

## 2018-12-31 NOTE — Patient Instructions (Signed)

## 2018-12-31 NOTE — Telephone Encounter (Signed)
Insurance has been verified for prolia in 2020---estimated $0 copay, patient can get prolia injection either on/after jan. 30th---patient is in office today, we will advise her of benefit information

## 2018-12-31 NOTE — Progress Notes (Signed)
Subjective:  Patient ID: Diane Sawyer, female    DOB: Feb 25, 1946  Age: 73 y.o. MRN: [id number]  CC: No chief complaint on file.   HPI Diane Sawyer presents for HTN, OA, anxiety f/u. Lost 17 lbs  Outpatient Medications Prior to Visit  Medication Sig Dispense Refill  . acetaminophen (TYLENOL) 500 MG tablet Take 1,000 mg by mouth every 6 (six) hours as needed for mild pain.    [name] ALPRAZolam (XANAX) 0.25 MG tablet Take 1 tablet (0.25 mg total) by mouth 2 (two) times daily as needed for anxiety. 60 tablet 3  . amLODipine (NORVASC) 5 MG tablet TAKE 1 TABLET(5 MG) BY MOUTH DAILY 7 tablet 0  . Cholecalciferol 1000 units capsule Take 2 capsules (2,000 Units total) by mouth daily. 100 capsule 11  . clobetasol cream (TEMOVATE) 9.24 % Apply 1 application topically daily as needed (dermatosis). Reported on 05/23/2016    . etodolac (LODINE) 500 MG tablet Take 500 mg by mouth as needed.     . fexofenadine (ALLEGRA) 180 MG tablet Take 180 mg by mouth daily.      . IRON PO Take 65 mg by mouth daily.     . methocarbamol (ROBAXIN) 500 MG tablet Take 1 tablet (500 mg total) by mouth every 8 (eight) hours as needed for muscle spasms. 60 tablet 1  . pantoprazole (PROTONIX) 40 MG tablet Take 1 tablet (40 mg total) by mouth daily. 30 tablet 0  . timolol (BETIMOL) 0.25 % ophthalmic solution Place 1 drop into the right eye daily.      No facility-administered medications prior to visit.     ROS: Review of Systems  Constitutional: Negative for activity change, appetite change, chills, fatigue and unexpected weight change.  HENT: Negative for congestion, mouth sores and sinus pressure.   Eyes: Negative for visual disturbance.  Respiratory: Negative for cough and chest tightness.   Gastrointestinal: Negative for abdominal pain and nausea.  Genitourinary: Negative for difficulty urinating, frequency and vaginal pain.  Musculoskeletal: Negative for back pain and gait problem.  Skin: Negative for pallor and rash.    Neurological: Negative for dizziness, tremors, weakness, numbness and headaches.  Psychiatric/Behavioral: Negative for confusion, sleep disturbance and suicidal ideas.    Objective:  BP 134/72 (BP Location: Left Arm, Patient Position: Sitting, Cuff Size: Normal)   Pulse 99   Ht 5\' 3"  (1.6 m)   Wt 186 lb (84.4 kg)   SpO2 99%   BMI 32.95 kg/m   BP Readings from Last 3 Encounters:  12/31/18 134/72  12/31/18 134/72  12/20/18 114/74    Wt Readings from Last 3 Encounters:  12/31/18 186 lb (84.4 kg)  12/31/18 186 lb (84.4 kg)  12/20/18 181 lb (82.1 kg)    Physical Exam Constitutional:      General: She is not in acute distress.    Appearance: She is well-developed.  HENT:     Head: Normocephalic.     Right Ear: External ear normal.     Left Ear: External ear normal.     Nose: Nose normal.  Eyes:     General:        Right eye: No discharge.        Left eye: No discharge.     Conjunctiva/sclera: Conjunctivae normal.     Pupils: Pupils are equal, round, and reactive to light.  Neck:     Musculoskeletal: Normal range of motion and neck supple.     Thyroid: No thyromegaly.  Vascular: No JVD.     Trachea: No tracheal deviation.  Cardiovascular:     Rate and Rhythm: Normal rate and regular rhythm.     Heart sounds: Normal heart sounds.  Pulmonary:     Effort: No respiratory distress.     Breath sounds: No stridor. No wheezing.  Abdominal:     General: Bowel sounds are normal. There is no distension.     Palpations: Abdomen is soft. There is no mass.     Tenderness: There is no abdominal tenderness. There is no guarding or rebound.  Musculoskeletal:        General: No tenderness.  Lymphadenopathy:     Cervical: No cervical adenopathy.  Skin:    Findings: No erythema or rash.  Neurological:     Cranial Nerves: No cranial nerve deficit.     Motor: No abnormal muscle tone.     Coordination: Coordination normal.     Deep Tendon Reflexes: Reflexes normal.   Psychiatric:        Behavior: Behavior normal.        Thought Content: Thought content normal.        Judgment: Judgment normal.   no bruit Thyroid WNL  Lab Results  Component Value Date   WBC 7.3 06/17/2018   HGB 13.2 06/17/2018   HCT 42.0 06/17/2018   PLT 210 06/17/2018   GLUCOSE 91 09/25/2018   CHOL 157 06/20/2018   TRIG 81 06/20/2018   HDL 58 06/20/2018   LDLCALC 83 06/20/2018   ALT 11 09/25/2018   AST 16 09/25/2018   NA 144 09/25/2018   K 5.2 09/25/2018   CL 105 09/25/2018   CREATININE 0.79 09/25/2018   BUN 16 09/25/2018   CO2 25 09/25/2018   TSH 1.270 12/25/2017   HGBA1C 5.6 09/25/2018    Dg Chest 2 View  Result Date: 06/17/2018 CLINICAL DATA:  Patient c/o cough x 2 weeks, some wheezing, and RUQ pain. HX of htn. Non-smoker. EXAM: CHEST - 2 VIEW COMPARISON:  10/01/2016 FINDINGS: Heart size is normal. There is minimal bibasilar atelectasis. No focal consolidations or pulmonary edema. Moderate hiatal hernia is again noted. IMPRESSION: No evidence for acute  abnormality.  Hiatal hernia. Electronically Signed   By: Nolon Nations M.D.   On: 06/17/2018 13:27    Assessment & Plan:   There are no diagnoses linked to this encounter.   No orders of the defined types were placed in this encounter.    Follow-up: No follow-ups on file.  Walker Kehr, MD

## 2018-12-31 NOTE — Assessment & Plan Note (Addendum)
Amlodipine Offered cardiac CT scan for calcium scoring

## 2019-01-04 ENCOUNTER — Ambulatory Visit (INDEPENDENT_AMBULATORY_CARE_PROVIDER_SITE_OTHER): Payer: Medicare Other | Admitting: *Deleted

## 2019-01-04 DIAGNOSIS — M810 Age-related osteoporosis without current pathological fracture: Secondary | ICD-10-CM

## 2019-01-04 MED ORDER — DENOSUMAB 60 MG/ML ~~LOC~~ SOSY
60.0000 mg | PREFILLED_SYRINGE | Freq: Once | SUBCUTANEOUS | Status: AC
  Administered 2019-01-04: 60 mg via SUBCUTANEOUS

## 2019-01-08 ENCOUNTER — Ambulatory Visit
Admission: RE | Admit: 2019-01-08 | Discharge: 2019-01-08 | Disposition: A | Discharge status: Home / Self Care | Payer: Medicare Other | Source: Ambulatory Visit | Attending: Internal Medicine | Admitting: Internal Medicine

## 2019-01-08 DIAGNOSIS — Z1231 Encounter for screening mammogram for malignant neoplasm of breast: Secondary | ICD-10-CM | POA: Diagnosis not present

## 2019-01-10 ENCOUNTER — Encounter (INDEPENDENT_AMBULATORY_CARE_PROVIDER_SITE_OTHER): Payer: Self-pay | Admitting: Family Medicine

## 2019-01-10 ENCOUNTER — Ambulatory Visit (INDEPENDENT_AMBULATORY_CARE_PROVIDER_SITE_OTHER): Payer: Medicare Other | Admitting: Family Medicine

## 2019-01-10 VITALS — BP 122/80 | HR 78 | Temp 98.2°F | Ht 63.0 in | Wt 184.0 lb

## 2019-01-10 DIAGNOSIS — Z6832 Body mass index (BMI) 32.0-32.9, adult: Secondary | ICD-10-CM | POA: Diagnosis not present

## 2019-01-10 DIAGNOSIS — Z9189 Other specified personal risk factors, not elsewhere classified: Secondary | ICD-10-CM | POA: Diagnosis not present

## 2019-01-10 DIAGNOSIS — E559 Vitamin D deficiency, unspecified: Secondary | ICD-10-CM

## 2019-01-10 DIAGNOSIS — E8881 Metabolic syndrome: Secondary | ICD-10-CM | POA: Diagnosis not present

## 2019-01-10 DIAGNOSIS — E669 Obesity, unspecified: Secondary | ICD-10-CM

## 2019-01-10 MED ORDER — VITAMIN D (ERGOCALCIFEROL) 1.25 MG (50000 UNIT) PO CAPS: 50000.0000 [IU] | ORAL_CAPSULE | ORAL | 0 refills | Status: DC

## 2019-01-10 NOTE — Progress Notes (Addendum)
Office: [phone]  /  Fax: [phone]   HPI:   Chief Complaint: OBESITY Diane Sawyer is here to discuss her progress with her obesity treatment plan. She is on the Category 2 plan and is following her eating plan approximately 75 % of the time. She states she is walking 30 minutes 7 times per week. Diane Sawyer is eating all of the food on her plan. She feels she is retaining water. Her weight is 184 lb (83.5 kg) today and has gained 4 lbs since her last visit. She has lost 12 lbs since starting treatment with [place].  Insulin Resistance Diane Sawyer has a diagnosis of insulin resistance based on her elevated fasting insulin level >5. Although Diane Sawyer's blood glucose readings are still under good control, insulin resistance puts her at greater risk of metabolic syndrome and diabetes. She declined metformin currently and continues to work on diet and exercise to decrease risk of diabetes. She reports polyphagia between lunch and dinner.  Vitamin D deficiency Diane Sawyer has a diagnosis of vitamin D deficiency. She is not at goal. Diane Sawyer is not currently taking prescription Vit D. She did not know to ask for refills. She denies nausea, vomiting or muscle weakness. She reports occasional fatigue.   ASSESSMENT AND PLAN:  Insulin resistance  Vitamin D deficiency - Plan: Vitamin D, Ergocalciferol, (DRISDOL) 1.25 MG (50000 UT) CAPS capsule  Class 1 obesity with serious comorbidity and body mass index (BMI) of 32.0 to 32.9 in adult, unspecified obesity type  PLAN:  Insulin Resistance Diane Sawyer will continue to work on weight loss, exercise, and decreasing simple carbohydrates in her diet to help decrease the risk of diabetes.  We will check A1C and insulin at next visit. Diane Sawyer agrees to follow up with our clinic in 2-3 weeks.  Vitamin D Deficiency Diane Sawyer was informed that low vitamin D levels contributes to fatigue and are associated with obesity, breast, and colon cancer. Diane Sawyer agrees to continue to take prescription Vit  D 50,000 IU every week #4 with no refills and will follow up for routine testing of vitamin D, at least 2-3 times per year. She was informed of the risk of over-replacement of vitamin D and agrees to not increase her dose unless she discusses this with [place] first. We will check Vit D level at next visit. Diane Sawyer agrees to follow up with our clinic in 2-3 weeks.   Obesity Diane Sawyer is currently in the action stage of change. As such, her goal is to continue with weight loss efforts She has agreed to follow the Category 2 plan Diane Sawyer has been instructed to walk for 30 minutes 7 times per week for weight loss and overall health benefits. We discussed the following Behavioral Modification Strategies today: celebration eating strategies and planning for success. Diane Sawyer is encouraged to be more mindful of extra calories from carbohydrates at dinner.  Diane Sawyer has agreed to follow up with our clinic in 2-3 weeks. She was informed of the importance of frequent follow up visits to maximize her success with intensive lifestyle modifications for her multiple health conditions.  ALLERGIES: Allergies  Allergen Reactions  . Benazepril Hcl     REACTION: achy- makes her feel strange.  . Levofloxacin Nausea Only  . Mobic [Meloxicam] Other (See Comments)    Felt like chest going to explode    MEDICATIONS: Current Outpatient Medications on File Prior to Visit  Medication Sig Dispense Refill  . acetaminophen (TYLENOL) 500 MG tablet Take 1,000 mg by mouth every 6 (six) hours as needed  for mild pain.    [name] ALPRAZolam (XANAX) 0.25 MG tablet Take 1 tablet (0.25 mg total) by mouth 2 (two) times daily as needed for anxiety. 60 tablet 3  . amLODipine (NORVASC) 5 MG tablet TAKE 1 TABLET(5 MG) BY MOUTH DAILY 7 tablet 0  . Cholecalciferol 1000 units capsule Take 2 capsules (2,000 Units total) by mouth daily. 100 capsule 11  . clobetasol cream (TEMOVATE) 6.54 % Apply 1 application topically daily as needed (dermatosis). Reported on  05/23/2016    . etodolac (LODINE) 500 MG tablet Take 500 mg by mouth as needed.     . fexofenadine (ALLEGRA) 180 MG tablet Take 180 mg by mouth daily.      . IRON PO Take 65 mg by mouth daily.     . methocarbamol (ROBAXIN) 500 MG tablet Take 1 tablet (500 mg total) by mouth every 8 (eight) hours as needed for muscle spasms. 60 tablet 1  . pantoprazole (PROTONIX) 40 MG tablet Take 1 tablet (40 mg total) by mouth daily. 30 tablet 0  . timolol (BETIMOL) 0.25 % ophthalmic solution Place 1 drop into the right eye daily.      No current facility-administered medications on file prior to visit.     PAST MEDICAL HISTORY: Past Medical History:  Diagnosis Date  . Allergic rhinitis   . Anemia   . Anxiety   . Back pain   . Diverticulosis   . GERD (gastroesophageal reflux disease)   . Glaucoma   . Hypertension   . Lumbar compression fracture ([place]) 09/07/2016   x 2   . Menopause   . Vitamin D deficiency     PAST SURGICAL HISTORY: Past Surgical History:  Procedure Laterality Date  . CATARACT EXTRACTION, BILATERAL  04/06/2017   with cypass stent implanted  . NO PAST SURGERIES      SOCIAL HISTORY: Social History   Tobacco Use  . Smoking status: Never Smoker  . Smokeless tobacco: Never Used  Substance Use Topics  . Alcohol use: No  . Drug use: No    FAMILY HISTORY: Family History  Problem Relation Age of Onset  . Stroke Mother 33  . Hypertension Mother   . Sudden death Mother   . Anxiety disorder Mother   . Lung cancer Father 76  . Colon cancer Neg Hx     ROS: Review of Systems  Constitutional: Positive for malaise/fatigue. Negative for weight loss.  Gastrointestinal: Negative for nausea and vomiting.  Musculoskeletal:       Negative for muscle weakness  Endo/Heme/Allergies:       Positive for polyphagia    PHYSICAL EXAM: Blood pressure 122/80, pulse 78, temperature 98.2 F (36.8 C), temperature source Oral, height 5\' 3"  (1.6 m), weight 184 lb (83.5 kg), SpO2 98  %. Body mass index is 32.59 kg/m. Physical Exam Vitals signs reviewed.  Constitutional:      Appearance: Normal appearance. She is obese.  Cardiovascular:     Rate and Rhythm: Normal rate.     Pulses: Normal pulses.  Pulmonary:     Effort: Pulmonary effort is normal.  Musculoskeletal: Normal range of motion.  Skin:    General: Skin is warm and dry.  Neurological:     Mental Status: She is alert and oriented to person, place, and time.  Psychiatric:        Mood and Affect: Mood normal.        Behavior: Behavior normal.     RECENT LABS AND TESTS: BMET  Component Value Date/Time   NA 144 09/25/2018 1004   K 5.2 09/25/2018 1004   CL 105 09/25/2018 1004   CO2 25 09/25/2018 1004   GLUCOSE 91 09/25/2018 1004   GLUCOSE 107 (H) 06/17/2018 1251   BUN 16 09/25/2018 1004   CREATININE 0.79 09/25/2018 1004   CALCIUM 9.2 09/25/2018 1004   GFRNONAA 76 09/25/2018 1004   GFRAA 87 09/25/2018 1004   Lab Results  Component Value Date   HGBA1C 5.6 09/25/2018   HGBA1C 5.8 (H) 06/20/2018   HGBA1C 5.8 (H) 12/25/2017   Lab Results  Component Value Date   INSULIN 7.1 09/25/2018   INSULIN 9.0 12/25/2017   CBC    Component Value Date/Time   WBC 7.3 06/17/2018 1251   RBC 4.40 06/17/2018 1251   HGB 13.2 06/17/2018 1251   HGB 14.0 12/25/2017 1021   HCT 42.0 06/17/2018 1251   HCT 41.5 12/25/2017 1021   PLT 210 06/17/2018 1251   MCV 95.5 06/17/2018 1251   MCV 88 12/25/2017 1021   MCH 30.0 06/17/2018 1251   MCHC 31.4 06/17/2018 1251   RDW 12.9 06/17/2018 1251   RDW 13.7 12/25/2017 1021   LYMPHSABS 1.4 12/25/2017 1021   MONOABS 0.5 06/12/2017 1111   EOSABS 0.1 12/25/2017 1021   BASOSABS 0.0 12/25/2017 1021   Iron/TIBC/Ferritin/ %Sat    Component Value Date/Time   IRON 82 05/23/2016 1005   IRONPCTSAT 24.9 05/23/2016 1005   Lipid Panel     Component Value Date/Time   CHOL 157 06/20/2018 0859   TRIG 81 06/20/2018 0859   HDL 58 06/20/2018 0859   CHOLHDL 3 06/12/2017 1111    VLDL 23.6 06/12/2017 1111   LDLCALC 83 06/20/2018 0859   Hepatic Function Panel     Component Value Date/Time   PROT 6.8 09/25/2018 1004   ALBUMIN 4.7 09/25/2018 1004   AST 16 09/25/2018 1004   ALT 11 09/25/2018 1004   ALKPHOS 47 09/25/2018 1004   BILITOT 0.4 09/25/2018 1004   BILIDIR 0.0 06/12/2017 1111      Component Value Date/Time   TSH 1.270 12/25/2017 1021   TSH 0.75 06/12/2017 1111   TSH 0.87 11/09/2015 0940    Ref. Range 09/25/2018 10:04  Vitamin D, 25-Hydroxy Latest Ref Range: 30.0 - 100.0 ng/mL 38.5     OBESITY BEHAVIORAL INTERVENTION VISIT  Today's visit was # 21   Starting weight: 196 lbs Starting date: 12/25/2017 Today's weight : 184 lbs Today's date: 01/14/2019 Total lbs lost to date: 12 At least 15 minutes were spent on discussing the following behavioral intervention visit.    ASK: We discussed the diagnosis of obesity with Gladis Riffle today and Matisyn agreed to give [place] permission to discuss obesity behavioral modification therapy today.  ASSESS: Pricella has the diagnosis of obesity and her BMI today is 32.6 Shadonna is in the action stage of change   ADVISE: Tyresa was educated on the multiple health risks of obesity as well as the benefit of weight loss to improve her health. She was advised of the need for long term treatment and the importance of lifestyle modifications to improve her current health and to decrease her risk of future health problems.  AGREE: Multiple dietary modification options and treatment options were discussed and  Patria agreed to follow the recommendations documented in the above note.  ARRANGE: Micha was educated on the importance of frequent visits to treat obesity as outlined per CMS and USPSTF guidelines and agreed to schedule her next  follow up appointment today.  I, Tammy Wysor, am acting as Location manager for [employer], FNP-C.  I have reviewed the above documentation for accuracy and completeness, and I agree  with the above.  - Coreen Shippee, FNP-C.

## 2019-01-14 ENCOUNTER — Encounter (INDEPENDENT_AMBULATORY_CARE_PROVIDER_SITE_OTHER): Payer: Self-pay | Admitting: Family Medicine

## 2019-01-14 DIAGNOSIS — E8881 Metabolic syndrome: Secondary | ICD-10-CM | POA: Insufficient documentation

## 2019-01-14 DIAGNOSIS — E88819 Insulin resistance, unspecified: Secondary | ICD-10-CM | POA: Insufficient documentation

## 2019-01-25 IMAGING — DX DG CHEST 2V
2 series · 2 of 2 positions shown · non-contrast
Comparison: 10/01/2016

CLINICAL DATA: Patient c/o cough x 2 weeks, some wheezing, and RUQ
pain. HX of htn. Non-smoker.

EXAM:
CHEST - 2 VIEW

[chest pa]
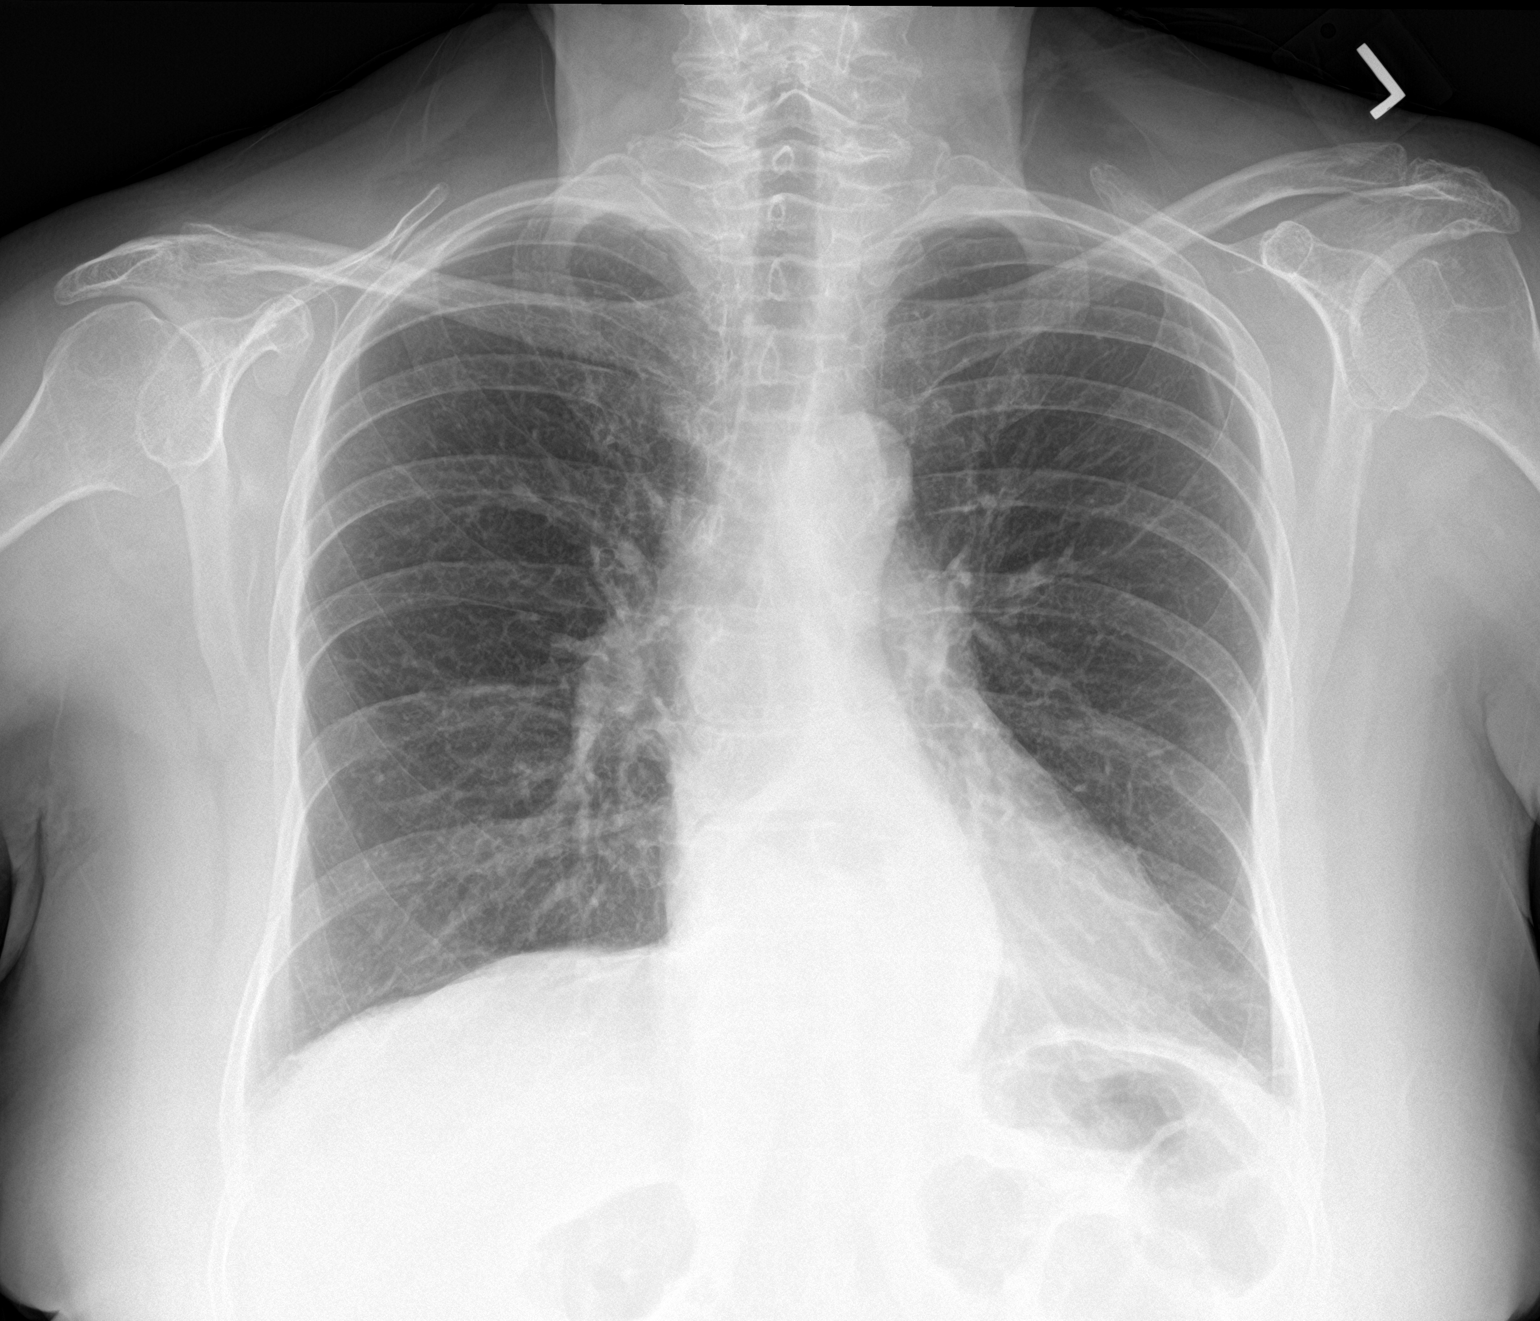

[chest lat]
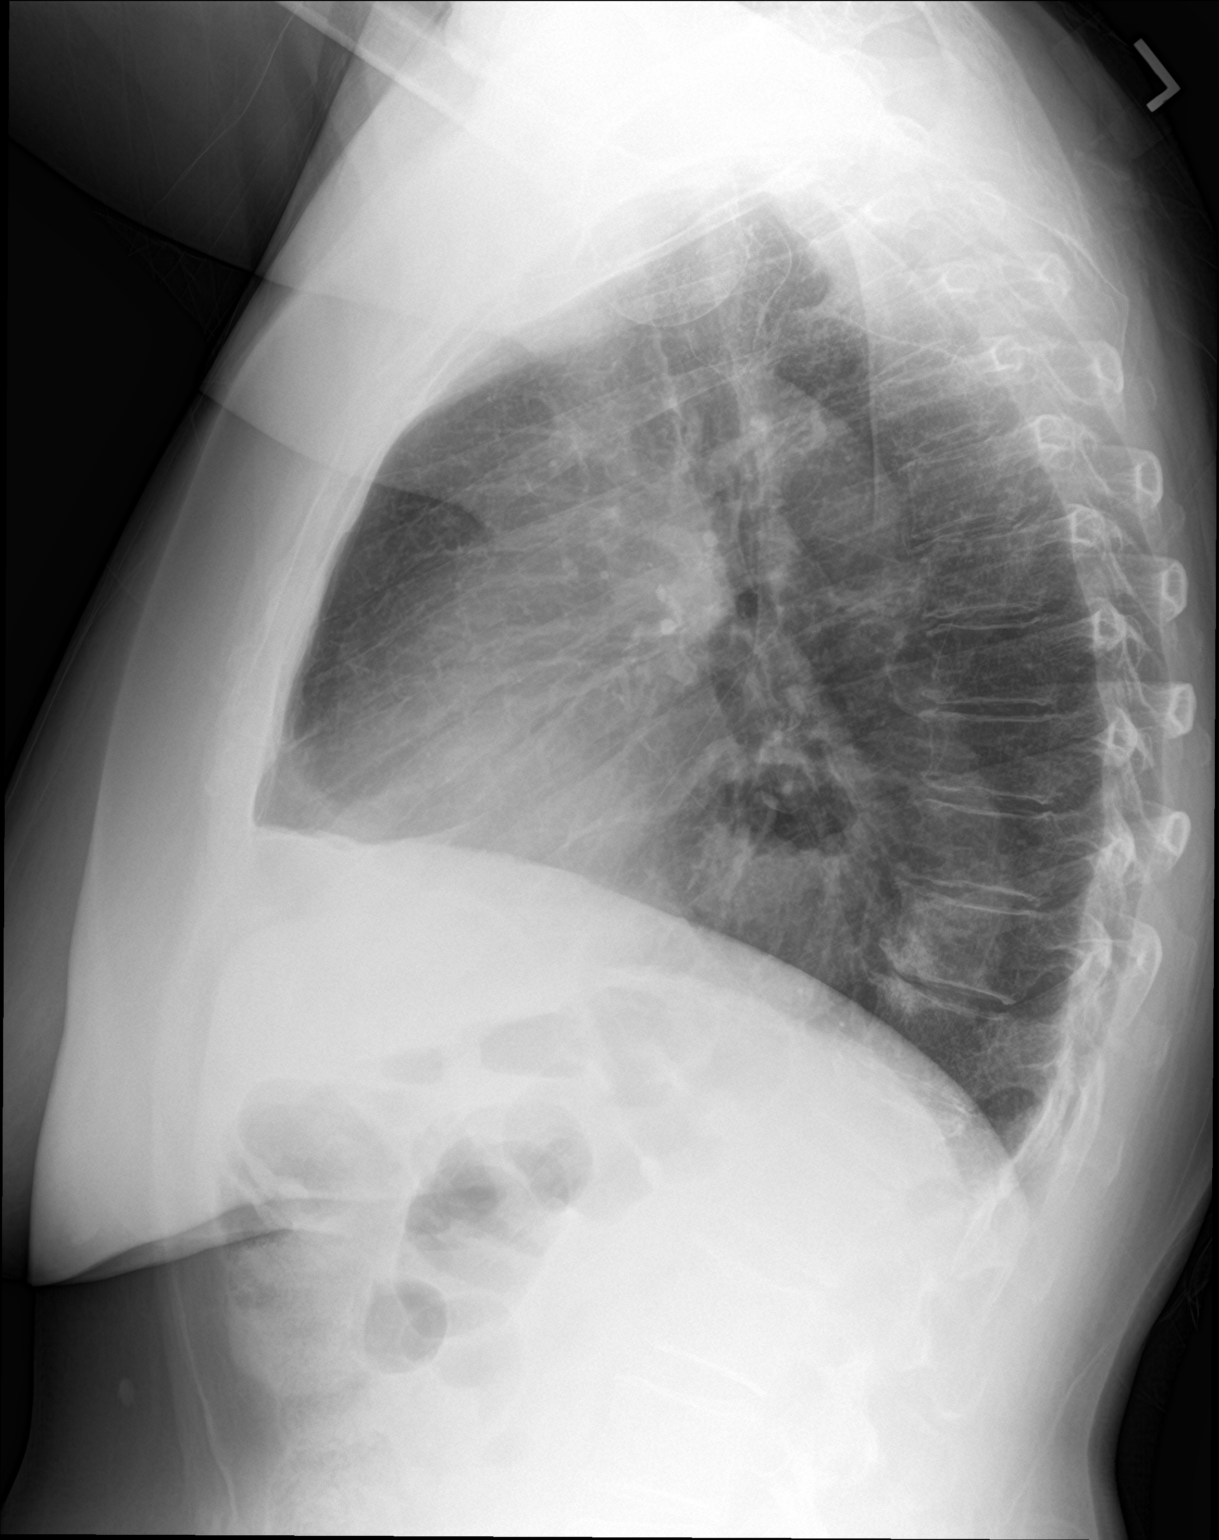

[2 of 2 positions shown; findings below may reference images not displayed]

FINDINGS: Heart size is normal. There is minimal bibasilar atelectasis. No
focal consolidations or pulmonary edema. Moderate hiatal hernia is
again noted.
IMPRESSION: No evidence for acute  abnormality.  Hiatal hernia.

## 2019-01-28 DIAGNOSIS — H401111 Primary open-angle glaucoma, right eye, mild stage: Secondary | ICD-10-CM | POA: Diagnosis not present

## 2019-01-28 DIAGNOSIS — H26492 Other secondary cataract, left eye: Secondary | ICD-10-CM | POA: Diagnosis not present

## 2019-01-28 DIAGNOSIS — H401122 Primary open-angle glaucoma, left eye, moderate stage: Secondary | ICD-10-CM | POA: Diagnosis not present

## 2019-01-28 DIAGNOSIS — H2511 Age-related nuclear cataract, right eye: Secondary | ICD-10-CM | POA: Diagnosis not present

## 2019-01-28 DIAGNOSIS — Z961 Presence of intraocular lens: Secondary | ICD-10-CM | POA: Diagnosis not present

## 2019-01-30 ENCOUNTER — Encounter (INDEPENDENT_AMBULATORY_CARE_PROVIDER_SITE_OTHER): Payer: Self-pay | Admitting: Family Medicine

## 2019-01-30 ENCOUNTER — Ambulatory Visit (INDEPENDENT_AMBULATORY_CARE_PROVIDER_SITE_OTHER): Payer: Medicare Other | Admitting: Family Medicine

## 2019-01-30 ENCOUNTER — Other Ambulatory Visit (INDEPENDENT_AMBULATORY_CARE_PROVIDER_SITE_OTHER): Payer: Self-pay | Admitting: Family Medicine

## 2019-01-30 VITALS — BP 138/85 | HR 70 | Temp 97.7°F | Ht 63.0 in | Wt 184.0 lb

## 2019-01-30 DIAGNOSIS — E669 Obesity, unspecified: Secondary | ICD-10-CM | POA: Diagnosis not present

## 2019-01-30 DIAGNOSIS — Z6832 Body mass index (BMI) 32.0-32.9, adult: Secondary | ICD-10-CM | POA: Diagnosis not present

## 2019-01-30 DIAGNOSIS — E8881 Metabolic syndrome: Secondary | ICD-10-CM

## 2019-01-30 DIAGNOSIS — E559 Vitamin D deficiency, unspecified: Secondary | ICD-10-CM

## 2019-01-30 MED ORDER — METFORMIN HCL 500 MG PO TABS: 500.0000 mg | ORAL_TABLET | Freq: Every day | ORAL | 0 refills | Status: DC

## 2019-01-30 MED ORDER — VITAMIN D (ERGOCALCIFEROL) 1.25 MG (50000 UNIT) PO CAPS: 50000.0000 [IU] | ORAL_CAPSULE | ORAL | 0 refills | Status: DC

## 2019-01-30 NOTE — Progress Notes (Signed)
Office: [phone]  /  Fax: [phone]   HPI:   Chief Complaint: OBESITY Diane Sawyer is here to discuss her progress with her obesity treatment plan. She is on the Category 2 plan and is following her eating plan approximately 80 % of the time. She states she is walking 7 times per week. Diane Sawyer is not fasting today so she is unable to do labs. We will draw labs at her next visit. Diane Sawyer has had increased stress recently which has caused her to be off track slightly with the plan. She is eating all the protein on her plan.  Her weight is 184 lb (83.5 kg) today and has not lost weight since her last visit. She has lost 12 lbs since starting treatment with [place].  Vitamin D deficiency Diane Sawyer has a diagnosis of vitamin D deficiency. She is currently taking vit D and is not at goal. Her last vitamin D level was 38.5 on 09/25/18.   Insulin Resistance Diane Sawyer has a diagnosis of insulin resistance based on her elevated fasting insulin level >5. Although Diane Sawyer's blood glucose readings are still under good control, insulin resistance puts her at greater risk of metabolic syndrome and diabetes. She is not taking metformin currently and continues to work on diet and exercise to decrease risk of diabetes. Diane Sawyer admits polyphagia between lunch and dinner. Lab Results  Component Value Date   HGBA1C 5.6 09/25/2018    ASSESSMENT AND PLAN:  Vitamin D deficiency - Plan: Vitamin D, Ergocalciferol, (DRISDOL) 1.25 MG (50000 UT) CAPS capsule  Insulin resistance  Class 1 obesity with serious comorbidity and body mass index (BMI) of 32.0 to 32.9 in adult, unspecified obesity type  PLAN:  Vitamin D Deficiency Diane Sawyer was informed that low vitamin D levels contributes to fatigue and are associated with obesity, breast, and colon cancer. Diane Sawyer agrees to continue to take prescription Vit D @50 ,000 IU every week #4 with no refills and will follow up for routine testing of vitamin D, at least 2-3 times per year. She was  informed of the risk of over-replacement of vitamin D and agrees to not increase her dose unless she discusses this with [place] first. We will check her vitamin D level at her next visit and Diane Sawyer agrees to follow up in 4 weeks as directed.  Insulin Resistance Diane Sawyer will continue to work on weight loss, exercise, and decreasing simple carbohydrates in her diet to help decrease the risk of diabetes. We discussed metformin including benefits and risks. She was informed that eating too many simple carbohydrates or too many calories at one sitting increases the likelihood of GI side effects. [name] agreed to start metformin 500mg  with lunch #30 with no refills and new prescription was written today. A fasting Insulin and an A1c will be ordered at her next visit as she is not fasting today. [name] agreed to follow up with [place] as directed to monitor her progress.  Obesity Diane Sawyer is currently in the action stage of change. As such, her goal is to continue with weight loss efforts. She has agreed to follow the Category 2 plan. Diane Sawyer has been instructed to continue walking 7 day per week. We discussed the following Behavioral Modification Strategies today: work on meal planning and easy cooking plans and planning for success.  Diane Sawyer has agreed to follow up with our clinic in 4 weeks. She was informed of the importance of frequent follow up visits to maximize her success with intensive lifestyle modifications for her multiple health conditions.  ALLERGIES: Allergies  Allergen Reactions  . Benazepril Hcl     REACTION: achy- makes her feel strange.  . Levofloxacin Nausea Only  . Mobic [Meloxicam] Other (See Comments)    Felt like chest going to explode    MEDICATIONS: Current Outpatient Medications on File Prior to Visit  Medication Sig Dispense Refill  . acetaminophen (TYLENOL) 500 MG tablet Take 1,000 mg by mouth every 6 (six) hours as needed for mild pain.    [name] ALPRAZolam (XANAX) 0.25 MG tablet Take 1  tablet (0.25 mg total) by mouth 2 (two) times daily as needed for anxiety. 60 tablet 3  . amLODipine (NORVASC) 5 MG tablet TAKE 1 TABLET(5 MG) BY MOUTH DAILY 7 tablet 0  . Cholecalciferol 1000 units capsule Take 2 capsules (2,000 Units total) by mouth daily. 100 capsule 11  . clobetasol cream (TEMOVATE) 9.51 % Apply 1 application topically daily as needed (dermatosis). Reported on 05/23/2016    . etodolac (LODINE) 500 MG tablet Take 500 mg by mouth as needed.     . fexofenadine (ALLEGRA) 180 MG tablet Take 180 mg by mouth daily.      . IRON PO Take 65 mg by mouth daily.     . methocarbamol (ROBAXIN) 500 MG tablet Take 1 tablet (500 mg total) by mouth every 8 (eight) hours as needed for muscle spasms. 60 tablet 1  . pantoprazole (PROTONIX) 40 MG tablet Take 1 tablet (40 mg total) by mouth daily. 30 tablet 0  . timolol (BETIMOL) 0.25 % ophthalmic solution Place 1 drop into the right eye daily.      No current facility-administered medications on file prior to visit.     PAST MEDICAL HISTORY: Past Medical History:  Diagnosis Date  . Allergic rhinitis   . Anemia   . Anxiety   . Back pain   . Diverticulosis   . GERD (gastroesophageal reflux disease)   . Glaucoma   . Hypertension   . Lumbar compression fracture ([place]) 09/07/2016   x 2   . Menopause   . Vitamin D deficiency     PAST SURGICAL HISTORY: Past Surgical History:  Procedure Laterality Date  . CATARACT EXTRACTION, BILATERAL  04/06/2017   with cypass stent implanted  . NO PAST SURGERIES      SOCIAL HISTORY: Social History   Tobacco Use  . Smoking status: Never Smoker  . Smokeless tobacco: Never Used  Substance Use Topics  . Alcohol use: No  . Drug use: No    FAMILY HISTORY: Family History  Problem Relation Age of Onset  . Stroke Mother 35  . Hypertension Mother   . Sudden death Mother   . Anxiety disorder Mother   . Lung cancer Father 69  . Colon cancer Neg Hx     ROS: Review of Systems  Constitutional:  Negative for weight loss.  Endo/Heme/Allergies:       Positive for polyphagia.    PHYSICAL EXAM: Blood pressure 138/85, pulse 70, temperature 97.7 F (36.5 C), height 5\' 3"  (1.6 m), weight 184 lb (83.5 kg), SpO2 99 %. Body mass index is 32.59 kg/m. Physical Exam Vitals signs reviewed.  Constitutional:      Appearance: Normal appearance. She is obese.  Cardiovascular:     Rate and Rhythm: Normal rate.  Pulmonary:     Effort: Pulmonary effort is normal.  Musculoskeletal: Normal range of motion.  Skin:    General: Skin is warm and dry.  Neurological:     Mental Status: She  is alert and oriented to person, place, and time.  Psychiatric:        Mood and Affect: Mood normal.        Behavior: Behavior normal.     RECENT LABS AND TESTS: BMET    Component Value Date/Time   NA 144 09/25/2018 1004   K 5.2 09/25/2018 1004   CL 105 09/25/2018 1004   CO2 25 09/25/2018 1004   GLUCOSE 91 09/25/2018 1004   GLUCOSE 107 (H) 06/17/2018 1251   BUN 16 09/25/2018 1004   CREATININE 0.79 09/25/2018 1004   CALCIUM 9.2 09/25/2018 1004   GFRNONAA 76 09/25/2018 1004   GFRAA 87 09/25/2018 1004   Lab Results  Component Value Date   HGBA1C 5.6 09/25/2018   HGBA1C 5.8 (H) 06/20/2018   HGBA1C 5.8 (H) 12/25/2017   Lab Results  Component Value Date   INSULIN 7.1 09/25/2018   INSULIN 9.0 12/25/2017   CBC    Component Value Date/Time   WBC 7.3 06/17/2018 1251   RBC 4.40 06/17/2018 1251   HGB 13.2 06/17/2018 1251   HGB 14.0 12/25/2017 1021   HCT 42.0 06/17/2018 1251   HCT 41.5 12/25/2017 1021   PLT 210 06/17/2018 1251   MCV 95.5 06/17/2018 1251   MCV 88 12/25/2017 1021   MCH 30.0 06/17/2018 1251   MCHC 31.4 06/17/2018 1251   RDW 12.9 06/17/2018 1251   RDW 13.7 12/25/2017 1021   LYMPHSABS 1.4 12/25/2017 1021   MONOABS 0.5 06/12/2017 1111   EOSABS 0.1 12/25/2017 1021   BASOSABS 0.0 12/25/2017 1021   Iron/TIBC/Ferritin/ %Sat    Component Value Date/Time   IRON 82 05/23/2016 1005    IRONPCTSAT 24.9 05/23/2016 1005   Lipid Panel     Component Value Date/Time   CHOL 157 06/20/2018 0859   TRIG 81 06/20/2018 0859   HDL 58 06/20/2018 0859   CHOLHDL 3 06/12/2017 1111   VLDL 23.6 06/12/2017 1111   LDLCALC 83 06/20/2018 0859   Hepatic Function Panel     Component Value Date/Time   PROT 6.8 09/25/2018 1004   ALBUMIN 4.7 09/25/2018 1004   AST 16 09/25/2018 1004   ALT 11 09/25/2018 1004   ALKPHOS 47 09/25/2018 1004   BILITOT 0.4 09/25/2018 1004   BILIDIR 0.0 06/12/2017 1111      Component Value Date/Time   TSH 1.270 12/25/2017 1021   TSH 0.75 06/12/2017 1111   TSH 0.87 11/09/2015 0940   Results for POONAM, WOEHRLE (MRN [id number]) as of 01/30/2019 10:04  Ref. Range 09/25/2018 10:04  Vitamin D, 25-Hydroxy Latest Ref Range: 30.0 - 100.0 ng/mL 38.5    OBESITY BEHAVIORAL INTERVENTION VISIT  Today's visit was # 22   Starting weight: 196 lbs Starting date: 12/25/17 Today's weight : Weight: 184 lb (83.5 kg)  Today's date: 01/30/2019 Total lbs lost to date: 12 At least 15 minutes were spent on discussing the following behavioral intervention visit.    01/30/2019  Height 5\' 3"  (1.6 m)  Weight 184 lb (83.5 kg)  BMI (Calculated) 32.6  BLOOD PRESSURE - SYSTOLIC 665  BLOOD PRESSURE - DIASTOLIC 85   Body Fat % 99.3 %  Total Body Water (lbs) 73.2 lbs    ASK: We discussed the diagnosis of obesity with Gladis Riffle today and Destiney agreed to give [place] permission to discuss obesity behavioral modification therapy today.  ASSESS: Essense has the diagnosis of obesity and her BMI today is 32.6. Kinsey is in the action stage of change.  ADVISE: Belanna was educated on the multiple health risks of obesity as well as the benefit of weight loss to improve her health. She was advised of the need for long term treatment and the importance of lifestyle modifications to improve her current health and to decrease her risk of future health problems.  AGREE: Multiple dietary  modification options and treatment options were discussed and Runell agreed to follow the recommendations documented in the above note.  ARRANGE: Milisa was educated on the importance of frequent visits to treat obesity as outlined per CMS and USPSTF guidelines and agreed to schedule her next follow up appointment today.  Diane Sawyer, CMA, am acting as Location manager for [employer], FNP-C.  I have reviewed the above documentation for accuracy and completeness, and I agree with the above.  - Gregori Abril, FNP-C.

## 2019-02-18 ENCOUNTER — Encounter: Payer: Self-pay | Admitting: Gastroenterology

## 2019-02-23 ENCOUNTER — Other Ambulatory Visit: Payer: Self-pay | Admitting: Internal Medicine

## 2019-02-23 DIAGNOSIS — K219 Gastro-esophageal reflux disease without esophagitis: Secondary | ICD-10-CM

## 2019-02-27 ENCOUNTER — Encounter (INDEPENDENT_AMBULATORY_CARE_PROVIDER_SITE_OTHER): Payer: Self-pay

## 2019-02-27 ENCOUNTER — Ambulatory Visit (INDEPENDENT_AMBULATORY_CARE_PROVIDER_SITE_OTHER): Payer: Medicare Other | Admitting: Family Medicine

## 2019-02-28 ENCOUNTER — Other Ambulatory Visit: Payer: Self-pay | Admitting: Internal Medicine

## 2019-02-28 DIAGNOSIS — I10 Essential (primary) hypertension: Secondary | ICD-10-CM

## 2019-03-01 ENCOUNTER — Other Ambulatory Visit: Payer: Self-pay | Admitting: Internal Medicine

## 2019-03-04 ENCOUNTER — Encounter (INDEPENDENT_AMBULATORY_CARE_PROVIDER_SITE_OTHER): Payer: Self-pay

## 2019-04-01 ENCOUNTER — Encounter: Payer: Medicare Other | Admitting: Gastroenterology

## 2019-04-05 ENCOUNTER — Telehealth: Payer: Self-pay | Admitting: Gastroenterology

## 2019-04-05 NOTE — Telephone Encounter (Signed)
All questions answered.  She will add Miralax for constipation.  She will keep her phone call with Dr. Fuller Plan on Monday

## 2019-04-05 NOTE — Telephone Encounter (Signed)
Attempted to return call.  Her VM is not set up.

## 2019-04-08 ENCOUNTER — Other Ambulatory Visit: Payer: Self-pay

## 2019-04-08 ENCOUNTER — Ambulatory Visit (INDEPENDENT_AMBULATORY_CARE_PROVIDER_SITE_OTHER): Payer: Medicare Other | Admitting: Gastroenterology

## 2019-04-08 ENCOUNTER — Encounter: Payer: Self-pay | Admitting: Gastroenterology

## 2019-04-08 VITALS — Ht 63.5 in | Wt 177.0 lb

## 2019-04-08 DIAGNOSIS — Z86010 Personal history of colonic polyps: Secondary | ICD-10-CM

## 2019-04-08 DIAGNOSIS — R1013 Epigastric pain: Secondary | ICD-10-CM | POA: Diagnosis not present

## 2019-04-08 DIAGNOSIS — K219 Gastro-esophageal reflux disease without esophagitis: Secondary | ICD-10-CM

## 2019-04-08 MED ORDER — NA SULFATE-K SULFATE-MG SULF 17.5-3.13-1.6 GM/177ML PO SOLN: 1.0000 | Freq: Once | ORAL | 0 refills | Status: AC

## 2019-04-08 NOTE — Progress Notes (Signed)
History of Present Illness: This is a 73 year old female who relates epigastric pain and reflux symptoms for the past several weeks.  She added pantoprazole 20 mg in the evening and her symptoms did not respond.  She contacted our office and we advised pantoprazole 40 mg twice daily and her symptoms have improved.  She also noted mild constipation and was advised to begin MiraLAX qd as needed. Her constipation has improved.  Denies weight loss,  diarrhea, change in stool caliber, melena, hematochezia, nausea, vomiting, dysphagia, chest pain.    Allergies  Allergen Reactions  . Benazepril Hcl     REACTION: achy- makes her feel strange.  . Levofloxacin Nausea Only  . Mobic [Meloxicam] Other (See Comments)    Felt like chest going to explode   Outpatient Medications Prior to Visit  Medication Sig Dispense Refill  . acetaminophen (TYLENOL) 500 MG tablet Take 1,000 mg by mouth every 6 (six) hours as needed for mild pain.    [name] ALPRAZolam (XANAX) 0.25 MG tablet TAKE 1 TABLET BY MOUTH TWICE DAILY AS NEEDED FOR ANXIETY 60 tablet 3  . amLODipine (NORVASC) 5 MG tablet TAKE 1 TABLET BY MOUTH EVERY DAY 90 tablet 0  . Cholecalciferol 1000 units capsule Take 2 capsules (2,000 Units total) by mouth daily. (Patient taking differently: Take 1,000 Units by mouth daily. ) 100 capsule 11  . clobetasol cream (TEMOVATE) 4.23 % Apply 1 application topically daily as needed (dermatosis). Reported on 05/23/2016    . etodolac (LODINE) 500 MG tablet Take 500 mg by mouth as needed.     . fexofenadine (ALLEGRA) 180 MG tablet Take 180 mg by mouth daily.      . IRON PO Take 65 mg by mouth daily.     . methocarbamol (ROBAXIN) 500 MG tablet Take 1 tablet (500 mg total) by mouth every 8 (eight) hours as needed for muscle spasms. 60 tablet 1  . pantoprazole (PROTONIX) 40 MG tablet TAKE 1 TABLET BY MOUTH EVERY DAY (Patient taking differently: Take 40 mg by mouth 2 (two) times daily. ) 90 tablet 2  . timolol (BETIMOL) 0.25 %  ophthalmic solution Place 1 drop into the right eye daily.     . metFORMIN (GLUCOPHAGE) 500 MG tablet Take 1 tablet (500 mg total) by mouth daily with breakfast. 30 tablet 0  . pantoprazole (PROTONIX) 20 MG tablet TAKE 1 TABLET BY MOUTH EVERY DAY 90 tablet 0  . Vitamin D, Ergocalciferol, (DRISDOL) 1.25 MG (50000 UT) CAPS capsule Take 1 capsule (50,000 Units total) by mouth every 7 (seven) days. 4 capsule 0   No facility-administered medications prior to visit.    Past Medical History:  Diagnosis Date  . Allergic rhinitis   . Anemia   . Anxiety   . Back pain   . Diverticulosis   . GERD (gastroesophageal reflux disease)   . Glaucoma   . Hypertension   . Lumbar compression fracture ([place]) 09/07/2016   x 2   . Menopause   . Vitamin D deficiency    Past Surgical History:  Procedure Laterality Date  . CATARACT EXTRACTION, BILATERAL  04/06/2017   with cypass stent implanted  . NO PAST SURGERIES     Social History   Socioeconomic History  . Marital status: Married    Spouse name: Mortimer Fries "[place]" [place]  . Number of children: 2  . Years of education: Not on file  . Highest education level: Not on file  Occupational History  . Occupation: Emergency planning/management officer  Social Needs  . Financial resource strain: Not hard at all  . Food insecurity:    Worry: Never true    Inability: Never true  . Transportation needs:    Medical: No    Non-medical: No  Tobacco Use  . Smoking status: Never Smoker  . Smokeless tobacco: Never Used  Substance and Sexual Activity  . Alcohol use: No  . Drug use: No  . Sexual activity: Yes    Birth control/protection: None  Lifestyle  . Physical activity:    Days per week: 0 days    Minutes per session: 0 min  . Stress: Not at all  Relationships  . Social connections:    Talks on phone: More than three times a week    Gets together: More than three times a week    Attends religious service: More than 4 times per year    Active member of club or organization:  Yes    Attends meetings of clubs or organizations: More than 4 times per year    Relationship status: Married  Other Topics Concern  . Not on file  Social History Narrative  . Not on file   Family History  Problem Relation Age of Onset  . Stroke Mother 31  . Hypertension Mother   . Sudden death Mother   . Anxiety disorder Mother   . Lung cancer Father 75  . Colon cancer Neg Hx        Physical Exam: Telemedicine - not performed    Assessment and Recommendations:  1. GERD.  Continue pantoprazole 40 mg twice daily for 1 month and then resume pantoprazole 40 mg daily.  Follow standard antireflux measures.  May use Tums as needed and Pepcid AC twice daily as needed for breakthrough symptoms.  She has not previously undergone EGD which is reasonable for Barrett's screening if her symptoms come under control.  If her symptoms do not come under control EGD is indicated for further evaluation.  Schedule EGD. The risks (including bleeding, perforation, infection, missed lesions, medication reactions and possible hospitalization or surgery if complications occur), benefits, and alternatives to endoscopy with possible biopsy and possible dilation were discussed with the patient and they consent to proceed.   2. Personal history of adenomatous colon polyps.  A 5-year interval surveillance colonoscopy was due in December 2019.  She would like to schedule colonoscopy. The risks (including bleeding, perforation, infection, missed lesions, medication reactions and possible hospitalization or surgery if complications occur), benefits, and alternatives to colonoscopy with possible biopsy and possible polypectomy were discussed with the patient and they consent to proceed.   3.  Mild constipation.  Increase daily fiber and water intake.  Continue MiraLAX daily as needed.    These services were provided via telemedicine, audio only per patient request.  The patient was at home and the provider was in  the office, alone.  We discussed the limitations of evaluation and management by telemedicine and the availability of in person appointments.  Patient consented for this telemedicine visit and is aware of possible charges for this service.  The other person participating in the telemedicine service was Marlon Pel, CMA who reviewed medications, allergies, past history and completed AVS.  Time spent on call: 14 minutes

## 2019-04-08 NOTE — Patient Instructions (Signed)
Continue pantoprazole 40 mg twice daily.   You have been scheduled for an endoscopy and colonoscopy. Please follow the written instructions given to you at your visit today. Please pick up your prep supplies at the pharmacy within the next 1-3 days. If you use inhalers (even only as needed), please bring them with you on the day of your procedure. Your physician has requested that you go to www.startemmi.com and enter the access code given to you at your visit today. This web site gives a general overview about your procedure. However, you should still follow specific instructions given to you by our office regarding your preparation for the procedure.  Increase daily fiber and water intake.   Thank you for choosing me and [facility].  Pricilla Riffle. Dagoberto Ligas., MD., [facility] Regal

## 2019-04-19 ENCOUNTER — Telehealth: Payer: Self-pay | Admitting: *Deleted

## 2019-04-19 NOTE — Telephone Encounter (Signed)
Covid-19 travel screening questions  Have you traveled in the last 14 days? no If yes where?  Do you now or have you had a fever in the last 14 days? no  Do you have any respiratory symptoms of shortness of breath or cough now or in the last 14 days? no  Do you have any family members or close contacts with diagnosed or suspected Covid-19? No  Pt is aware that care partner will be waiting in the car during the procedure.  Pt will wear a mask into building

## 2019-04-22 ENCOUNTER — Other Ambulatory Visit: Payer: Self-pay

## 2019-04-22 ENCOUNTER — Ambulatory Visit (AMBULATORY_SURGERY_CENTER): Payer: Medicare Other | Admitting: Gastroenterology

## 2019-04-22 ENCOUNTER — Encounter: Payer: Self-pay | Admitting: Gastroenterology

## 2019-04-22 VITALS — BP 116/74 | HR 70 | Temp 98.8°F | Resp 11 | Ht 63.5 in | Wt 177.0 lb

## 2019-04-22 DIAGNOSIS — D123 Benign neoplasm of transverse colon: Secondary | ICD-10-CM | POA: Diagnosis not present

## 2019-04-22 DIAGNOSIS — R1013 Epigastric pain: Secondary | ICD-10-CM | POA: Diagnosis not present

## 2019-04-22 DIAGNOSIS — K219 Gastro-esophageal reflux disease without esophagitis: Secondary | ICD-10-CM | POA: Diagnosis not present

## 2019-04-22 DIAGNOSIS — K449 Diaphragmatic hernia without obstruction or gangrene: Secondary | ICD-10-CM

## 2019-04-22 DIAGNOSIS — D122 Benign neoplasm of ascending colon: Secondary | ICD-10-CM

## 2019-04-22 DIAGNOSIS — K635 Polyp of colon: Secondary | ICD-10-CM | POA: Diagnosis not present

## 2019-04-22 DIAGNOSIS — Z86010 Personal history of colonic polyps: Secondary | ICD-10-CM | POA: Diagnosis not present

## 2019-04-22 DIAGNOSIS — K317 Polyp of stomach and duodenum: Secondary | ICD-10-CM

## 2019-04-22 DIAGNOSIS — D124 Benign neoplasm of descending colon: Secondary | ICD-10-CM

## 2019-04-22 MED ORDER — SODIUM CHLORIDE 0.9 % IV SOLN: 500.0000 mL | Freq: Once | INTRAVENOUS | Status: DC

## 2019-04-22 NOTE — Op Note (Signed)
[place] Patient Name: Diane Sawyer Procedure Date: 04/22/2019 2:44 PM MRN: [id number] Endoscopist: Ladene Artist , MD Age: 73 Referring MD:  Date of Birth: 1946/07/17 Gender: Female Account #: [id number] Procedure:                Upper GI endoscopy Indications:              Screening for Barrett's esophagus in patient at                            risk for this condition, Gastroesophageal reflux                            disease Medicines:                Monitored Anesthesia Care Procedure:                Pre-Anesthesia Assessment:                           - Prior to the procedure, a History and Physical                            was performed, and patient medications and                            allergies were reviewed. The patient's tolerance of                            previous anesthesia was also reviewed. The risks                            and benefits of the procedure and the sedation                            options and risks were discussed with the patient.                            All questions were answered, and informed consent                            was obtained. Prior Anticoagulants: The patient has                            taken no previous anticoagulant or antiplatelet                            agents. ASA Grade Assessment: II - A patient with                            mild systemic disease. After reviewing the risks                            and benefits, the patient was deemed in  satisfactory condition to undergo the procedure.                           After obtaining informed consent, the endoscope was                            passed under direct vision. Throughout the                            procedure, the patient's blood pressure, pulse, and                            oxygen saturations were monitored continuously. The                            Endoscope was introduced through the mouth, and                         advanced to the second part of duodenum. The upper                            GI endoscopy was accomplished without difficulty.                            The patient tolerated the procedure well. Scope In: Scope Out: Findings:                 The examined esophagus was normal. Z-line at 34 cm.                           A large hiatal hernia was present. Measuring 8 cm.                           Three 5 to 8 mm sessile polyps with no bleeding and                            no stigmata of recent bleeding were found in the                            cardia and in the gastric body. The polyps were                            removed with a cold snare. Resection and retrieval                            were complete.                           The exam of the stomach was otherwise normal.                           The duodenal bulb and second portion of the  duodenum were normal. Complications:            No immediate complications. Estimated Blood Loss:     Estimated blood loss was minimal. Impression:               - Normal esophagus.                           - Large hiatal hernia.                           - Three gastric polyps. Resected and retrieved.                           - Normal duodenal bulb and second portion of the                            duodenum. Recommendation:           - Patient has a contact number available for                            emergencies. The signs and symptoms of potential                            delayed complications were discussed with the                            patient. Return to normal activities tomorrow.                            Written discharge instructions were provided to the                            patient.                           - Resume previous diet.                           - Follow antireflux measures.                           - Continue present medications.                            - Await pathology results.                           - Return to GI office in 2 months. Ladene Artist, MD 04/22/2019 3:37:17 PM This report has been signed electronically.

## 2019-04-22 NOTE — Progress Notes (Signed)
To PACU, VSS. Report to [email or web address] 

## 2019-04-22 NOTE — Op Note (Signed)
[place] Patient Name: Diane Sawyer Procedure Date: 04/22/2019 2:45 PM MRN: [id number] Endoscopist: Ladene Artist , MD Age: 73 Referring MD:  Date of Birth: 04-25-1946 Gender: Female Account #: [id number] Procedure:                Colonoscopy Indications:              Surveillance: Personal history of adenomatous                            polyps on last colonoscopy 5 years ago Medicines:                Monitored Anesthesia Care Procedure:                Pre-Anesthesia Assessment:                           - Prior to the procedure, a History and Physical                            was performed, and patient medications and                            allergies were reviewed. The patient's tolerance of                            previous anesthesia was also reviewed. The risks                            and benefits of the procedure and the sedation                            options and risks were discussed with the patient.                            All questions were answered, and informed consent                            was obtained. Prior Anticoagulants: The patient has                            taken no previous anticoagulant or antiplatelet                            agents. ASA Grade Assessment: II - A patient with                            mild systemic disease. After reviewing the risks                            and benefits, the patient was deemed in                            satisfactory condition to undergo the procedure.  After obtaining informed consent, the colonoscope                            was passed under direct vision. Throughout the                            procedure, the patient's blood pressure, pulse, and                            oxygen saturations were monitored continuously. The                            Colonoscope was introduced through the anus and                            advanced to the the cecum,  identified by                            appendiceal orifice and ileocecal valve. The                            ileocecal valve, appendiceal orifice, and rectum                            were photographed. The quality of the bowel                            preparation was good. The colonoscopy was performed                            without difficulty. The patient tolerated the                            procedure well. Scope In: 2:54:13 PM Scope Out: 3:14:35 PM Scope Withdrawal Time: 0 hours 16 minutes 44 seconds  Total Procedure Duration: 0 hours 20 minutes 22 seconds  Findings:                 The perianal and digital rectal examinations were                            normal.                           Three sessile polyps were found in the descending                            colon, transverse colon and ascending colon. The                            polyps were 6 to 9 mm in size. These polyps were                            removed with a cold snare. Resection and retrieval  were complete.                           Multiple medium-mouthed diverticula were found in                            the left colon. There was evidence of diverticular                            spasm. There was no evidence of diverticular                            bleeding.                           Internal hemorrhoids were found during                            retroflexion. The hemorrhoids were medium-sized and                            Grade I (internal hemorrhoids that do not prolapse).                           The exam was otherwise without abnormality on                            direct and retroflexion views. Complications:            No immediate complications. Estimated blood loss:                            None. Estimated Blood Loss:     Estimated blood loss: none. Impression:               - Three 6 to 9 mm polyps in the descending colon,                             in the transverse colon and in the ascending colon,                            removed with a cold snare. Resected and retrieved.                           - Moderate diverticulosis in the left colon. There                            was evidence of diverticular spasm. There was no                            evidence of diverticular bleeding.                           - Internal hemorrhoids.                           -  The examination was otherwise normal on direct                            and retroflexion views. Recommendation:           - Repeat colonoscopy date to be determined after                            pending pathology results are reviewed for                            surveillance.                           - Patient has a contact number available for                            emergencies. The signs and symptoms of potential                            delayed complications were discussed with the                            patient. Return to normal activities tomorrow.                            Written discharge instructions were provided to the                            patient.                           - High fiber diet.                           - Continue present medications.                           - Await pathology results. Ladene Artist, MD 04/22/2019 3:18:04 PM This report has been signed electronically.

## 2019-04-22 NOTE — Progress Notes (Signed)
Called to room to assist during endoscopic procedure.  Patient ID and intended procedure confirmed with present staff. Received instructions for my participation in the procedure from the performing physician.  

## 2019-04-23 ENCOUNTER — Telehealth: Payer: Self-pay | Admitting: Gastroenterology

## 2019-04-23 NOTE — Telephone Encounter (Signed)
She called because she noticed bilateral leg pains starting last night following her colonoscopy/EGD yesterday.  NO abd pains.  No GI bleeding.  The leg pains are both thighs and also lower in her legs. Says it feels muscular.  Took a tylenol last night. Slept well but awoke with same discomfort.  Doesn't seem to be getting worse.  No swelling or tenderness in legs.  I am not sure why both her legs are sore.  May or may not have anything to do with her endoscopic procedures.  Perhaps mildly dehydrated?  I recommend she follow her symptoms another 24 hours and call back if things worsen or call her PCP.

## 2019-04-24 ENCOUNTER — Telehealth: Payer: Self-pay | Admitting: *Deleted

## 2019-04-24 ENCOUNTER — Telehealth: Payer: Self-pay

## 2019-04-24 NOTE — Telephone Encounter (Signed)
Please call Diane Sawyer or her PCP if her leg pain is not resolving by tomorrow

## 2019-04-24 NOTE — Telephone Encounter (Signed)
Copied from [place] [id number]. Topic: Appointment Scheduling - Scheduling Inquiry for Clinic >> Apr 24, 2019 11:20 AM Alanda Slim E wrote: Reason for CRM:  Pt thinks she may have a bladder infection and asked for a tele visit and medication to be called in or if se needed to bring in a urine sample / please advise

## 2019-04-24 NOTE — Telephone Encounter (Signed)
  Follow up Call-  Call back number 04/22/2019  Post procedure Call Back phone  # [phone]  [phone]  Permission to leave phone message Yes  Some recent data might be hidden     Patient questions:  Do you have a fever, pain , or abdominal swelling? yes Pain Score  4 *  Have you tolerated food without any problems? No.  Have you been able to return to your normal activities? Yes.    Do you have any questions about your discharge instructions: Diet   No. Medications  No. Follow up visit  No.  Do you have questions or concerns about your Care? Yes.  pt c/o both legs sore after the procedure. She said she talked to Dr. Ardis Hughs and thought that it might be related to " gardening over the weekend."  Actions: * If pain score is 4 or above: Physician/ provider Notified : Lucio Edward, MD.  1. Have you developed a fever since your procedure? no  2.   Have you had an respiratory symptoms (SOB or cough) since your procedure? no  3.   Have you tested positive for COVID 19 since your procedure no  4.   Have you had any family members/close contacts diagnosed with the COVID 19 since your procedure?  no   If any of these questions are a yes, please inquire if patient has been seen by family doctor and route this note to Joylene John, Therapist, sports.

## 2019-04-24 NOTE — Telephone Encounter (Signed)
Ok tele or reg visit Thx

## 2019-04-25 ENCOUNTER — Encounter: Payer: Self-pay | Admitting: Internal Medicine

## 2019-04-25 ENCOUNTER — Other Ambulatory Visit: Payer: Self-pay

## 2019-04-25 ENCOUNTER — Ambulatory Visit (INDEPENDENT_AMBULATORY_CARE_PROVIDER_SITE_OTHER): Payer: Medicare Other | Admitting: Internal Medicine

## 2019-04-25 ENCOUNTER — Other Ambulatory Visit (INDEPENDENT_AMBULATORY_CARE_PROVIDER_SITE_OTHER): Payer: Medicare Other

## 2019-04-25 DIAGNOSIS — M545 Low back pain, unspecified: Secondary | ICD-10-CM

## 2019-04-25 DIAGNOSIS — K219 Gastro-esophageal reflux disease without esophagitis: Secondary | ICD-10-CM

## 2019-04-25 DIAGNOSIS — R300 Dysuria: Secondary | ICD-10-CM

## 2019-04-25 LAB — POC URINALSYSI DIPSTICK (AUTOMATED)
Glucose, UA: NEGATIVE
Leukocytes, UA: NEGATIVE
Protein, UA: NEGATIVE
Spec Grav, UA: 1.015 (ref 1.010–1.025)
Urobilinogen, UA: NEGATIVE E.U./dL — AB
pH, UA: 7 (ref 5.0–8.0)

## 2019-04-25 LAB — URINALYSIS, ROUTINE W REFLEX MICROSCOPIC
Bilirubin Urine: NEGATIVE
Hgb urine dipstick: NEGATIVE
Ketones, ur: NEGATIVE
Nitrite: NEGATIVE
Specific Gravity, Urine: 1.02 (ref 1.000–1.030)
Total Protein, Urine: NEGATIVE
Urine Glucose: NEGATIVE
Urobilinogen, UA: 0.2 (ref 0.0–1.0)
pH: 7.5 (ref 5.0–8.0)

## 2019-04-25 MED ORDER — CEFUROXIME AXETIL 250 MG PO TABS: 250.0000 mg | ORAL_TABLET | Freq: Two times a day (BID) | ORAL | 0 refills | Status: AC

## 2019-04-25 MED ORDER — FLUCONAZOLE 150 MG PO TABS: 150.0000 mg | ORAL_TABLET | Freq: Once | ORAL | 1 refills | Status: AC

## 2019-04-25 NOTE — Assessment & Plan Note (Signed)
UA

## 2019-04-25 NOTE — Progress Notes (Signed)
Subjective:  Patient ID: Diane Sawyer, female    DOB: 18-Dec-1945  Age: 73 y.o. MRN: [id number]  CC: No chief complaint on file.   HPI Diane Sawyer presents for a poss UTI She had a colonoscopy and an EGD on Mon - burning on urination F/u HTN, anxiety, GERD  Outpatient Medications Prior to Visit  Medication Sig Dispense Refill  . acetaminophen (TYLENOL) 500 MG tablet Take 1,000 mg by mouth every 6 (six) hours as needed for mild pain.    [name] ALPRAZolam (XANAX) 0.25 MG tablet TAKE 1 TABLET BY MOUTH TWICE DAILY AS NEEDED FOR ANXIETY 60 tablet 3  . amLODipine (NORVASC) 5 MG tablet TAKE 1 TABLET BY MOUTH EVERY DAY 90 tablet 0  . Cholecalciferol 1000 units capsule Take 2 capsules (2,000 Units total) by mouth daily. (Patient taking differently: Take 1,000 Units by mouth daily. ) 100 capsule 11  . clobetasol cream (TEMOVATE) 3.30 % Apply 1 application topically daily as needed (dermatosis). Reported on 05/23/2016    . etodolac (LODINE) 500 MG tablet Take 500 mg by mouth as needed.     . fexofenadine (ALLEGRA) 180 MG tablet Take 180 mg by mouth daily.      . IRON PO Take 65 mg by mouth daily.     . methocarbamol (ROBAXIN) 500 MG tablet Take 1 tablet (500 mg total) by mouth every 8 (eight) hours as needed for muscle spasms. 60 tablet 1  . pantoprazole (PROTONIX) 40 MG tablet TAKE 1 TABLET BY MOUTH EVERY DAY (Patient taking differently: Take 40 mg by mouth 2 (two) times daily. ) 90 tablet 2  . timolol (BETIMOL) 0.25 % ophthalmic solution Place 1 drop into the right eye daily.      No facility-administered medications prior to visit.     ROS: Review of Systems  Constitutional: Negative for activity change, appetite change, chills, fatigue, fever and unexpected weight change.  HENT: Negative for congestion, mouth sores and sinus pressure.   Eyes: Negative for visual disturbance.  Respiratory: Negative for cough and chest tightness.   Gastrointestinal: Negative for abdominal pain and nausea.   Genitourinary: Positive for dysuria, frequency and urgency. Negative for difficulty urinating and vaginal pain.  Musculoskeletal: Positive for arthralgias. Negative for back pain and gait problem.  Skin: Negative for pallor and rash.  Neurological: Negative for dizziness, tremors, weakness, numbness and headaches.  Psychiatric/Behavioral: Negative for confusion and sleep disturbance.    Objective:  BP 130/84 (BP Location: Left Arm, Patient Position: Sitting, Cuff Size: Normal)   Pulse 81   Temp 98.4 F (36.9 C) (Oral)   Ht 5' 3.5" (1.613 m)   Wt 180 lb (81.6 kg)   SpO2 96%   BMI 31.39 kg/m   BP Readings from Last 3 Encounters:  04/25/19 130/84  04/22/19 116/74  01/30/19 138/85    Wt Readings from Last 3 Encounters:  04/25/19 180 lb (81.6 kg)  04/22/19 177 lb (80.3 kg)  04/08/19 177 lb (80.3 kg)    Physical Exam Constitutional:      General: She is not in acute distress.    Appearance: She is well-developed.  HENT:     Head: Normocephalic.     Right Ear: External ear normal.     Left Ear: External ear normal.     Nose: Nose normal.  Eyes:     General:        Right eye: No discharge.        Left eye: No discharge.  Conjunctiva/sclera: Conjunctivae normal.     Pupils: Pupils are equal, round, and reactive to light.  Neck:     Musculoskeletal: Normal range of motion and neck supple.     Thyroid: No thyromegaly.     Vascular: No JVD.     Trachea: No tracheal deviation.  Cardiovascular:     Rate and Rhythm: Normal rate and regular rhythm.     Heart sounds: Normal heart sounds.  Pulmonary:     Effort: No respiratory distress.     Breath sounds: No stridor. No wheezing.  Abdominal:     General: Bowel sounds are normal. There is no distension.     Palpations: Abdomen is soft. There is no mass.     Tenderness: There is no abdominal tenderness. There is no guarding or rebound.  Musculoskeletal:        General: No tenderness.  Lymphadenopathy:     Cervical: No  cervical adenopathy.  Skin:    Findings: No erythema or rash.  Neurological:     Cranial Nerves: No cranial nerve deficit.     Motor: No abnormal muscle tone.     Coordination: Coordination normal.     Deep Tendon Reflexes: Reflexes normal.  Psychiatric:        Behavior: Behavior normal.        Thought Content: Thought content normal.        Judgment: Judgment normal.     Lab Results  Component Value Date   WBC 7.3 06/17/2018   HGB 13.2 06/17/2018   HCT 42.0 06/17/2018   PLT 210 06/17/2018   GLUCOSE 91 09/25/2018   CHOL 157 06/20/2018   TRIG 81 06/20/2018   HDL 58 06/20/2018   LDLCALC 83 06/20/2018   ALT 11 09/25/2018   AST 16 09/25/2018   NA 144 09/25/2018   K 5.2 09/25/2018   CL 105 09/25/2018   CREATININE 0.79 09/25/2018   BUN 16 09/25/2018   CO2 25 09/25/2018   TSH 1.270 12/25/2017   HGBA1C 5.6 09/25/2018    Mm 3d Screen Breast Bilateral  Result Date: 01/08/2019 CLINICAL DATA:  Screening. EXAM: DIGITAL SCREENING BILATERAL MAMMOGRAM WITH TOMO AND CAD COMPARISON:  Previous exam(s). ACR Breast Density Category b: There are scattered areas of fibroglandular density. FINDINGS: There are no findings suspicious for malignancy. Images were processed with CAD. IMPRESSION: No mammographic evidence of malignancy. A result letter of this screening mammogram will be mailed directly to the patient. RECOMMENDATION: Screening mammogram in one year. (Code:SM-B-01Y) BI-RADS CATEGORY  1: Negative. Electronically Signed   By: Everlean Alstrom M.D.   On: 01/08/2019 12:21    Assessment & Plan:   There are no diagnoses linked to this encounter.   No orders of the defined types were placed in this encounter.    Follow-up: No follow-ups on file.  Walker Kehr, MD

## 2019-04-25 NOTE — Telephone Encounter (Signed)
Tried calling pt to make appt there was no answer and can't leave msg due to vm not set -up.Diane KitchenJohny Sawyer

## 2019-04-25 NOTE — Assessment & Plan Note (Signed)
HH on EGD Protonix

## 2019-04-25 NOTE — Telephone Encounter (Signed)
Pt call back and made in office appt for this afternoon...Diane Sawyer

## 2019-04-27 LAB — URINE CULTURE
MICRO NUMBER:: 496167
SPECIMEN QUALITY:: ADEQUATE

## 2019-04-30 ENCOUNTER — Other Ambulatory Visit: Payer: Self-pay | Admitting: Internal Medicine

## 2019-04-30 ENCOUNTER — Encounter: Payer: Self-pay | Admitting: Gastroenterology

## 2019-04-30 MED ORDER — AMPICILLIN 500 MG PO CAPS: 500.0000 mg | ORAL_CAPSULE | Freq: Three times a day (TID) | ORAL | 0 refills | Status: DC

## 2019-05-20 ENCOUNTER — Telehealth: Payer: Self-pay | Admitting: Gastroenterology

## 2019-05-20 DIAGNOSIS — K219 Gastro-esophageal reflux disease without esophagitis: Secondary | ICD-10-CM

## 2019-05-20 MED ORDER — PANTOPRAZOLE SODIUM 40 MG PO TBEC: 40.0000 mg | DELAYED_RELEASE_TABLET | Freq: Two times a day (BID) | ORAL | 1 refills | Status: DC

## 2019-05-20 NOTE — Telephone Encounter (Signed)
Patient called wanted to speak with the nurse about results

## 2019-05-20 NOTE — Telephone Encounter (Signed)
I reviewed the letter with the patient. Another copy mailed to the patient.  All questions answered.  She will call back for additional questions or concerns.

## 2019-06-03 DIAGNOSIS — H401122 Primary open-angle glaucoma, left eye, moderate stage: Secondary | ICD-10-CM | POA: Diagnosis not present

## 2019-06-03 DIAGNOSIS — H401111 Primary open-angle glaucoma, right eye, mild stage: Secondary | ICD-10-CM | POA: Diagnosis not present

## 2019-06-03 DIAGNOSIS — H2511 Age-related nuclear cataract, right eye: Secondary | ICD-10-CM | POA: Diagnosis not present

## 2019-06-03 DIAGNOSIS — H26492 Other secondary cataract, left eye: Secondary | ICD-10-CM | POA: Diagnosis not present

## 2019-06-03 DIAGNOSIS — Z961 Presence of intraocular lens: Secondary | ICD-10-CM | POA: Diagnosis not present

## 2019-06-04 DIAGNOSIS — H2511 Age-related nuclear cataract, right eye: Secondary | ICD-10-CM | POA: Diagnosis not present

## 2019-06-04 DIAGNOSIS — H401111 Primary open-angle glaucoma, right eye, mild stage: Secondary | ICD-10-CM | POA: Diagnosis not present

## 2019-06-04 DIAGNOSIS — H401122 Primary open-angle glaucoma, left eye, moderate stage: Secondary | ICD-10-CM | POA: Diagnosis not present

## 2019-06-04 DIAGNOSIS — Z961 Presence of intraocular lens: Secondary | ICD-10-CM | POA: Diagnosis not present

## 2019-06-04 DIAGNOSIS — H26492 Other secondary cataract, left eye: Secondary | ICD-10-CM | POA: Diagnosis not present

## 2019-06-06 ENCOUNTER — Telehealth: Payer: Self-pay | Admitting: Internal Medicine

## 2019-06-06 NOTE — Telephone Encounter (Signed)
Patient calls with questions regarding incubation times for covid19. She possibly was exposed on 6/11. Has not developed any symptoms as of yet. Is ASYMPTOMATIC. Reviewed incubation period and dates with her. She continues to wear mask check temps daily and frequent handwashings/disinfects daily. Call back with any concerns/questions.

## 2019-06-10 DIAGNOSIS — Z961 Presence of intraocular lens: Secondary | ICD-10-CM | POA: Diagnosis not present

## 2019-06-10 DIAGNOSIS — H2511 Age-related nuclear cataract, right eye: Secondary | ICD-10-CM | POA: Diagnosis not present

## 2019-06-10 DIAGNOSIS — H26492 Other secondary cataract, left eye: Secondary | ICD-10-CM | POA: Diagnosis not present

## 2019-06-10 DIAGNOSIS — H401122 Primary open-angle glaucoma, left eye, moderate stage: Secondary | ICD-10-CM | POA: Diagnosis not present

## 2019-06-10 DIAGNOSIS — H401111 Primary open-angle glaucoma, right eye, mild stage: Secondary | ICD-10-CM | POA: Diagnosis not present

## 2019-07-01 ENCOUNTER — Encounter: Payer: Self-pay | Admitting: Internal Medicine

## 2019-07-01 ENCOUNTER — Ambulatory Visit (INDEPENDENT_AMBULATORY_CARE_PROVIDER_SITE_OTHER): Payer: Medicare Other | Admitting: Internal Medicine

## 2019-07-01 ENCOUNTER — Other Ambulatory Visit: Payer: Self-pay

## 2019-07-01 DIAGNOSIS — K219 Gastro-esophageal reflux disease without esophagitis: Secondary | ICD-10-CM

## 2019-07-01 DIAGNOSIS — I10 Essential (primary) hypertension: Secondary | ICD-10-CM

## 2019-07-01 DIAGNOSIS — R7303 Prediabetes: Secondary | ICD-10-CM

## 2019-07-01 DIAGNOSIS — E559 Vitamin D deficiency, unspecified: Secondary | ICD-10-CM

## 2019-07-01 DIAGNOSIS — F419 Anxiety disorder, unspecified: Secondary | ICD-10-CM

## 2019-07-01 MED ORDER — PANTOPRAZOLE SODIUM 40 MG PO TBEC: 40.0000 mg | DELAYED_RELEASE_TABLET | Freq: Two times a day (BID) | ORAL | 3 refills | Status: DC

## 2019-07-01 MED ORDER — ALPRAZOLAM 0.25 MG PO TABS: 0.2500 mg | ORAL_TABLET | Freq: Two times a day (BID) | ORAL | 3 refills | Status: DC | PRN

## 2019-07-01 MED ORDER — AMLODIPINE BESYLATE 5 MG PO TABS: 5.0000 mg | ORAL_TABLET | Freq: Every day | ORAL | 3 refills | Status: DC

## 2019-07-01 NOTE — Assessment & Plan Note (Signed)
Chronic w/HH On Protonix bid

## 2019-07-01 NOTE — Assessment & Plan Note (Signed)
On amlodipine.

## 2019-07-01 NOTE — Progress Notes (Signed)
Subjective:  Patient ID: Diane Sawyer, female    DOB: November 18, 1946  Age: 73 y.o. MRN: [id number]  CC: No chief complaint on file.   HPI LURENA NAEVE presents for HTN, GERD, HH f/u  Outpatient Medications Prior to Visit  Medication Sig Dispense Refill   acetaminophen (TYLENOL) 500 MG tablet Take 1,000 mg by mouth every 6 (six) hours as needed for mild pain.     ALPRAZolam (XANAX) 0.25 MG tablet TAKE 1 TABLET BY MOUTH TWICE DAILY AS NEEDED FOR ANXIETY 60 tablet 3   amLODipine (NORVASC) 5 MG tablet TAKE 1 TABLET BY MOUTH EVERY DAY 90 tablet 0   Cholecalciferol 1000 units capsule Take 2 capsules (2,000 Units total) by mouth daily. (Patient taking differently: Take 1,000 Units by mouth daily. ) 100 capsule 11   clobetasol cream (TEMOVATE) 2.83 % Apply 1 application topically daily as needed (dermatosis). Reported on 05/23/2016     dorzolamide (TRUSOPT) 2 % ophthalmic solution INT 1 GTT IN OD TID     etodolac (LODINE) 500 MG tablet Take 500 mg by mouth as needed.      fexofenadine (ALLEGRA) 180 MG tablet Take 180 mg by mouth daily.       IRON PO Take 65 mg by mouth daily.      latanoprost (XALATAN) 0.005 % ophthalmic solution INT 1 GTT IN OU HS     methocarbamol (ROBAXIN) 500 MG tablet Take 1 tablet (500 mg total) by mouth every 8 (eight) hours as needed for muscle spasms. 60 tablet 1   pantoprazole (PROTONIX) 40 MG tablet Take 1 tablet (40 mg total) by mouth 2 (two) times daily. 180 tablet 1   timolol (BETIMOL) 0.25 % ophthalmic solution Place 1 drop into the right eye daily.      timolol (TIMOPTIC) 0.5 % ophthalmic solution INT 1 GTT IN OU BID     ampicillin (PRINCIPEN) 500 MG capsule Take 1 capsule (500 mg total) by mouth 3 (three) times daily. (Patient not taking: Reported on 07/01/2019) 21 capsule 0   No facility-administered medications prior to visit.     ROS: Review of Systems  Constitutional: Negative for activity change, appetite change, chills, fatigue and unexpected  weight change.  HENT: Negative for congestion, mouth sores and sinus pressure.   Eyes: Negative for visual disturbance.  Respiratory: Negative for cough and chest tightness.   Gastrointestinal: Negative for abdominal pain and nausea.  Genitourinary: Negative for difficulty urinating, frequency and vaginal pain.  Musculoskeletal: Positive for arthralgias. Negative for back pain and gait problem.  Skin: Negative for pallor and rash.  Neurological: Negative for dizziness, tremors, weakness, numbness and headaches.  Psychiatric/Behavioral: Negative for confusion and sleep disturbance.    Objective:  BP 110/68 (BP Location: Left Arm, Patient Position: Sitting, Cuff Size: Large)    Pulse 70    Temp 98.6 F (37 C) (Oral)    Ht 5' 3.5" (1.613 m)    Wt 181 lb (82.1 kg)    SpO2 97%    BMI 31.56 kg/m   BP Readings from Last 3 Encounters:  07/01/19 110/68  04/25/19 130/84  04/22/19 116/74    Wt Readings from Last 3 Encounters:  07/01/19 181 lb (82.1 kg)  04/25/19 180 lb (81.6 kg)  04/22/19 177 lb (80.3 kg)    Physical Exam Constitutional:      General: She is not in acute distress.    Appearance: She is well-developed.  HENT:     Head: Normocephalic.  Right Ear: External ear normal.     Left Ear: External ear normal.     Nose: Nose normal.  Eyes:     General:        Right eye: No discharge.        Left eye: No discharge.     Conjunctiva/sclera: Conjunctivae normal.     Pupils: Pupils are equal, round, and reactive to light.  Neck:     Musculoskeletal: Normal range of motion and neck supple.     Thyroid: No thyromegaly.     Vascular: No JVD.     Trachea: No tracheal deviation.  Cardiovascular:     Rate and Rhythm: Normal rate and regular rhythm.     Heart sounds: Normal heart sounds.  Pulmonary:     Effort: No respiratory distress.     Breath sounds: No stridor. No wheezing.  Abdominal:     General: Bowel sounds are normal. There is no distension.     Palpations:  Abdomen is soft. There is no mass.     Tenderness: There is no abdominal tenderness. There is no guarding or rebound.  Musculoskeletal:        General: No tenderness.  Lymphadenopathy:     Cervical: No cervical adenopathy.  Skin:    Findings: No erythema or rash.  Neurological:     Cranial Nerves: No cranial nerve deficit.     Motor: No abnormal muscle tone.     Coordination: Coordination normal.     Deep Tendon Reflexes: Reflexes normal.  Psychiatric:        Behavior: Behavior normal.        Thought Content: Thought content normal.        Judgment: Judgment normal.     Lab Results  Component Value Date   WBC 7.3 06/17/2018   HGB 13.2 06/17/2018   HCT 42.0 06/17/2018   PLT 210 06/17/2018   GLUCOSE 91 09/25/2018   CHOL 157 06/20/2018   TRIG 81 06/20/2018   HDL 58 06/20/2018   LDLCALC 83 06/20/2018   ALT 11 09/25/2018   AST 16 09/25/2018   NA 144 09/25/2018   K 5.2 09/25/2018   CL 105 09/25/2018   CREATININE 0.79 09/25/2018   BUN 16 09/25/2018   CO2 25 09/25/2018   TSH 1.270 12/25/2017   HGBA1C 5.6 09/25/2018    Mm 3d Screen Breast Bilateral  Result Date: 01/08/2019 CLINICAL DATA:  Screening. EXAM: DIGITAL SCREENING BILATERAL MAMMOGRAM WITH TOMO AND CAD COMPARISON:  Previous exam(s). ACR Breast Density Category b: There are scattered areas of fibroglandular density. FINDINGS: There are no findings suspicious for malignancy. Images were processed with CAD. IMPRESSION: No mammographic evidence of malignancy. A result letter of this screening mammogram will be mailed directly to the patient. RECOMMENDATION: Screening mammogram in one year. (Code:SM-B-01Y) BI-RADS CATEGORY  1: Negative. Electronically Signed   By: Everlean Alstrom M.D.   On: 01/08/2019 12:21    Assessment & Plan:   There are no diagnoses linked to this encounter.   No orders of the defined types were placed in this encounter.    Follow-up: No follow-ups on file.  Walker Kehr, MD

## 2019-07-01 NOTE — Assessment & Plan Note (Signed)
Labs

## 2019-07-01 NOTE — Assessment & Plan Note (Signed)
Xanax prn  Potential benefits of a long term benzodiazepines  use as well as potential risks  and complications were explained to the patient and were aknowledged. 

## 2019-07-01 NOTE — Assessment & Plan Note (Signed)
Vit D 

## 2019-07-08 ENCOUNTER — Ambulatory Visit (INDEPENDENT_AMBULATORY_CARE_PROVIDER_SITE_OTHER): Payer: Medicare Other

## 2019-07-08 DIAGNOSIS — M810 Age-related osteoporosis without current pathological fracture: Secondary | ICD-10-CM

## 2019-07-08 MED ORDER — DENOSUMAB 60 MG/ML ~~LOC~~ SOSY
60.0000 mg | PREFILLED_SYRINGE | Freq: Once | SUBCUTANEOUS | Status: AC
  Administered 2019-07-08: 60 mg via SUBCUTANEOUS

## 2019-07-17 ENCOUNTER — Encounter: Payer: Self-pay | Admitting: Gastroenterology

## 2019-07-21 NOTE — Progress Notes (Signed)
Medical screening examination/treatment/procedure(s) were performed by non-physician practitioner and as supervising physician I was immediately available for consultation/collaboration. I agree with above. Kenly Xiao, MD  

## 2019-08-19 DIAGNOSIS — H401111 Primary open-angle glaucoma, right eye, mild stage: Secondary | ICD-10-CM | POA: Diagnosis not present

## 2019-08-19 DIAGNOSIS — H401122 Primary open-angle glaucoma, left eye, moderate stage: Secondary | ICD-10-CM | POA: Diagnosis not present

## 2019-08-19 DIAGNOSIS — Z961 Presence of intraocular lens: Secondary | ICD-10-CM | POA: Diagnosis not present

## 2019-08-19 DIAGNOSIS — H26492 Other secondary cataract, left eye: Secondary | ICD-10-CM | POA: Diagnosis not present

## 2019-08-19 DIAGNOSIS — H2511 Age-related nuclear cataract, right eye: Secondary | ICD-10-CM | POA: Diagnosis not present

## 2019-09-05 ENCOUNTER — Encounter: Payer: Self-pay | Admitting: Gastroenterology

## 2019-09-05 ENCOUNTER — Ambulatory Visit (INDEPENDENT_AMBULATORY_CARE_PROVIDER_SITE_OTHER): Payer: Medicare Other | Admitting: Gastroenterology

## 2019-09-05 VITALS — BP 124/80 | HR 71 | Temp 98.1°F | Ht 63.0 in | Wt 183.0 lb

## 2019-09-05 DIAGNOSIS — K219 Gastro-esophageal reflux disease without esophagitis: Secondary | ICD-10-CM

## 2019-09-05 DIAGNOSIS — K449 Diaphragmatic hernia without obstruction or gangrene: Secondary | ICD-10-CM | POA: Diagnosis not present

## 2019-09-05 NOTE — Patient Instructions (Signed)
Thank you for choosing me and [facility].  Malcolm T. Stark, Jr., MD., FACG  

## 2019-09-05 NOTE — Progress Notes (Signed)
    History of Present Illness: This is a 73 year old female returning for follow-up of GERD.  Her symptoms are very well controlled on antireflux measures and pantoprazole 40 mg twice daily.  She has rare episodes of breakthrough symptoms.  EGD and colonoscopy from May as outlined below.  She states she occasionally has urgent looser bowel movements with certain foods such as salads and occasionally has urgent looser bowel movements in the mornings but this is been stable for quite some time.  EGD May 2020 - Normal esophagus. - Large hiatal hernia. - Three gastric polyps. Resected and retrieved. Hyperplastic and gastric fundic polyps - Normal duodenal bulb and second portion of the duodenum.  Colonoscopy May 2020 - Three 6 to 9 mm polyps in the descending colon, in the transverse colon and in the ascending colon, removed with a cold snare. Resected and retrieved. SSP - Moderate diverticulosis in the left colon. There was evidence of diverticular spasm. There was no evidence of diverticular bleeding. - Internal hemorrhoids. - The examination was otherwise normal on direct and retroflexion views.  Current Medications, Allergies, Past Medical History, Past Surgical History, Family History and Social History were reviewed in [employer] record.   Physical Exam: General: Well developed, well nourished, no acute distress Head: Normocephalic and atraumatic Eyes:  sclerae anicteric, EOMI Ears: Normal auditory acuity Mouth: No deformity or lesions Lungs: Clear throughout to auscultation Heart: Regular rate and rhythm; no murmurs, rubs or bruits Abdomen: Soft, non tender and non distended. No masses, hepatosplenomegaly or hernias noted. Normal Bowel sounds Rectal: Not done Musculoskeletal: Symmetrical with no gross deformities  Pulses:  Normal pulses noted Extremities: No clubbing, cyanosis, edema or deformities noted Neurological: Alert oriented x 4, grossly nonfocal  Psychological:  Alert and cooperative. Normal mood and affect   Assessment and Recommendations:  1. GERD, large hiatal hernia.  Follow antireflux measures long-term.  Continue pantoprazole 40 mg twice daily.  Refills through my office or per her PCP Dr. Alain Marion.  2. Personal history of adenomatous, SSP.  Recommend surveillance colonosopy in May 2023.

## 2019-10-21 ENCOUNTER — Ambulatory Visit (INDEPENDENT_AMBULATORY_CARE_PROVIDER_SITE_OTHER): Payer: Medicare Other | Admitting: Internal Medicine

## 2019-10-21 ENCOUNTER — Encounter: Payer: Self-pay | Admitting: Internal Medicine

## 2019-10-21 ENCOUNTER — Other Ambulatory Visit: Payer: Self-pay

## 2019-10-21 ENCOUNTER — Other Ambulatory Visit (INDEPENDENT_AMBULATORY_CARE_PROVIDER_SITE_OTHER): Payer: Medicare Other

## 2019-10-21 VITALS — BP 136/80 | HR 68 | Temp 98.6°F | Ht 63.0 in | Wt 181.0 lb

## 2019-10-21 DIAGNOSIS — Z23 Encounter for immunization: Secondary | ICD-10-CM | POA: Diagnosis not present

## 2019-10-21 DIAGNOSIS — R739 Hyperglycemia, unspecified: Secondary | ICD-10-CM | POA: Diagnosis not present

## 2019-10-21 DIAGNOSIS — D509 Iron deficiency anemia, unspecified: Secondary | ICD-10-CM | POA: Diagnosis not present

## 2019-10-21 DIAGNOSIS — I10 Essential (primary) hypertension: Secondary | ICD-10-CM

## 2019-10-21 DIAGNOSIS — M545 Low back pain, unspecified: Secondary | ICD-10-CM

## 2019-10-21 DIAGNOSIS — R7303 Prediabetes: Secondary | ICD-10-CM

## 2019-10-21 DIAGNOSIS — E785 Hyperlipidemia, unspecified: Secondary | ICD-10-CM

## 2019-10-21 DIAGNOSIS — F419 Anxiety disorder, unspecified: Secondary | ICD-10-CM | POA: Diagnosis not present

## 2019-10-21 DIAGNOSIS — E559 Vitamin D deficiency, unspecified: Secondary | ICD-10-CM

## 2019-10-21 LAB — HEPATIC FUNCTION PANEL
ALT: 9 U/L (ref 0–35)
AST: 13 U/L (ref 0–37)
Albumin: 4.5 g/dL (ref 3.5–5.2)
Alkaline Phosphatase: 51 U/L (ref 39–117)
Bilirubin, Direct: 0.1 mg/dL (ref 0.0–0.3)
Total Bilirubin: 0.5 mg/dL (ref 0.2–1.2)
Total Protein: 7.3 g/dL (ref 6.0–8.3)

## 2019-10-21 LAB — CBC WITH DIFFERENTIAL/PLATELET
Basophils Absolute: 0 10*3/uL (ref 0.0–0.1)
Basophils Relative: 0.6 % (ref 0.0–3.0)
Eosinophils Absolute: 0.2 10*3/uL (ref 0.0–0.7)
Eosinophils Relative: 2.8 % (ref 0.0–5.0)
HCT: 43.8 % (ref 36.0–46.0)
Hemoglobin: 14.6 g/dL (ref 12.0–15.0)
Lymphocytes Relative: 25.5 % (ref 12.0–46.0)
Lymphs Abs: 1.6 10*3/uL (ref 0.7–4.0)
MCHC: 33.2 g/dL (ref 30.0–36.0)
MCV: 92 fl (ref 78.0–100.0)
Monocytes Absolute: 0.4 10*3/uL (ref 0.1–1.0)
Monocytes Relative: 6.8 % (ref 3.0–12.0)
Neutro Abs: 4.1 10*3/uL (ref 1.4–7.7)
Neutrophils Relative %: 64.3 % (ref 43.0–77.0)
Platelets: 223 10*3/uL (ref 150.0–400.0)
RBC: 4.77 Mil/uL (ref 3.87–5.11)
RDW: 13.3 % (ref 11.5–15.5)
WBC: 6.4 10*3/uL (ref 4.0–10.5)

## 2019-10-21 LAB — LIPID PANEL
Cholesterol: 192 mg/dL (ref 0–200)
HDL: 65.1 mg/dL (ref >39.00)
LDL Cholesterol: 111 mg/dL — ABNORMAL HIGH (ref 0–99)
NonHDL: 126.7
Total CHOL/HDL Ratio: 3
Triglycerides: 79 mg/dL (ref 0.0–149.0)
VLDL: 15.8 mg/dL (ref 0.0–40.0)

## 2019-10-21 LAB — URINALYSIS
Bilirubin Urine: NEGATIVE
Hgb urine dipstick: NEGATIVE
Ketones, ur: NEGATIVE
Leukocytes,Ua: NEGATIVE
Nitrite: NEGATIVE
Specific Gravity, Urine: 1.02 (ref 1.000–1.030)
Total Protein, Urine: NEGATIVE
Urine Glucose: NEGATIVE
Urobilinogen, UA: 0.2 (ref 0.0–1.0)
pH: 6.5 (ref 5.0–8.0)

## 2019-10-21 LAB — BASIC METABOLIC PANEL
BUN: 14 mg/dL (ref 6–23)
CO2: 26 mEq/L (ref 19–32)
Calcium: 9.2 mg/dL (ref 8.4–10.5)
Chloride: 105 mEq/L (ref 96–112)
Creatinine, Ser: 0.68 mg/dL (ref 0.40–1.20)
GFR: 84.98 mL/min (ref >60.00)
Glucose, Bld: 101 mg/dL — ABNORMAL HIGH (ref 70–99)
Potassium: 4.6 mEq/L (ref 3.5–5.1)
Sodium: 140 mEq/L (ref 135–145)

## 2019-10-21 LAB — HEMOGLOBIN A1C: Hgb A1c MFr Bld: 5.7 % (ref 4.6–6.5)

## 2019-10-21 LAB — TSH: TSH: 0.99 u[IU]/mL (ref 0.35–4.50)

## 2019-10-21 MED ORDER — METHOCARBAMOL 500 MG PO TABS: 500.0000 mg | ORAL_TABLET | Freq: Three times a day (TID) | ORAL | 1 refills | Status: DC | PRN

## 2019-10-21 MED ORDER — ALPRAZOLAM 0.25 MG PO TABS: 0.2500 mg | ORAL_TABLET | Freq: Two times a day (BID) | ORAL | 3 refills | Status: DC | PRN

## 2019-10-21 NOTE — Progress Notes (Signed)
Subjective:  Patient ID: Diane Sawyer, female    DOB: 14-Apr-1946  Age: 73 y.o. MRN: [id number]  CC: No chief complaint on file.   HPI Diane Sawyer presents for HTN, GERD, anxiety f/u  Outpatient Medications Prior to Visit  Medication Sig Dispense Refill  . acetaminophen (TYLENOL) 500 MG tablet Take 1,000 mg by mouth every 6 (six) hours as needed for mild pain.    [name] ALPRAZolam (XANAX) 0.25 MG tablet Take 1 tablet (0.25 mg total) by mouth 2 (two) times daily as needed. for anxiety 60 tablet 3  . amLODipine (NORVASC) 5 MG tablet Take 1 tablet (5 mg total) by mouth daily. 90 tablet 3  . clobetasol cream (TEMOVATE) [id number] % Apply 1 application topically daily as needed (dermatosis). Reported on 05/23/2016    . dorzolamide (TRUSOPT) 2 % ophthalmic solution INT 1 GTT IN OD TID    . etodolac (LODINE) 500 MG tablet Take 500 mg by mouth as needed.     . fexofenadine (ALLEGRA) 180 MG tablet Take 180 mg by mouth daily.      . IRON PO Take 65 mg by mouth daily.     [name] latanoprost (XALATAN) 0.005 % ophthalmic solution INT 1 GTT IN OU HS    . methocarbamol (ROBAXIN) 500 MG tablet Take 1 tablet (500 mg total) by mouth every 8 (eight) hours as needed for muscle spasms. 60 tablet 1  . pantoprazole (PROTONIX) 40 MG tablet Take 1 tablet (40 mg total) by mouth 2 (two) times daily. 180 tablet 3  . timolol (BETIMOL) 0.25 % ophthalmic solution Place 1 drop into the right eye daily.     . timolol (TIMOPTIC) 0.5 % ophthalmic solution INT 1 GTT IN OU BID    . VITAMIN D PO Take 1,000 Units by mouth daily.     No facility-administered medications prior to visit.     ROS: Review of Systems  Constitutional: Negative for activity change, appetite change, chills, fatigue and unexpected weight change.  HENT: Negative for congestion, mouth sores and sinus pressure.   Eyes: Negative for visual disturbance.  Respiratory: Negative for cough and chest tightness.   Gastrointestinal: Negative for abdominal pain and nausea.   Genitourinary: Negative for difficulty urinating, frequency and vaginal pain.  Musculoskeletal: Positive for arthralgias. Negative for back pain and gait problem.  Skin: Negative for pallor and rash.  Neurological: Negative for dizziness, tremors, weakness, numbness and headaches.  Psychiatric/Behavioral: Negative for confusion, sleep disturbance and suicidal ideas. The patient is nervous/anxious.     Objective:  BP 136/80 (BP Location: Left Arm, Patient Position: Sitting, Cuff Size: Normal)   Pulse 68   Temp 98.6 F (37 C) (Oral)   Ht 5\' 3"  (1.6 m)   Wt 181 lb (82.1 kg)   SpO2 98%   BMI 32.06 kg/m   BP Readings from Last 3 Encounters:  10/21/19 136/80  09/05/19 124/80  07/01/19 110/68    Wt Readings from Last 3 Encounters:  10/21/19 181 lb (82.1 kg)  09/05/19 183 lb (83 kg)  07/01/19 181 lb (82.1 kg)    Physical Exam Constitutional:      General: She is not in acute distress.    Appearance: She is well-developed.  HENT:     Head: Normocephalic.     Right Ear: External ear normal.     Left Ear: External ear normal.     Nose: Nose normal.  Eyes:     General:  Right eye: No discharge.        Left eye: No discharge.     Conjunctiva/sclera: Conjunctivae normal.     Pupils: Pupils are equal, round, and reactive to light.  Neck:     Musculoskeletal: Normal range of motion and neck supple.     Thyroid: No thyromegaly.     Vascular: No JVD.     Trachea: No tracheal deviation.  Cardiovascular:     Rate and Rhythm: Normal rate and regular rhythm.     Heart sounds: Normal heart sounds.  Pulmonary:     Effort: No respiratory distress.     Breath sounds: No stridor. No wheezing.  Abdominal:     General: Bowel sounds are normal. There is no distension.     Palpations: Abdomen is soft. There is no mass.     Tenderness: There is no abdominal tenderness. There is no guarding or rebound.  Musculoskeletal:        General: No tenderness.  Lymphadenopathy:      Cervical: No cervical adenopathy.  Skin:    Findings: No erythema or rash.  Neurological:     Cranial Nerves: No cranial nerve deficit.     Motor: No abnormal muscle tone.     Coordination: Coordination normal.     Deep Tendon Reflexes: Reflexes normal.  Psychiatric:        Behavior: Behavior normal.        Thought Content: Thought content normal.        Judgment: Judgment normal.   LS  Tender w/ROM  Lab Results  Component Value Date   WBC 7.3 06/17/2018   HGB 13.2 06/17/2018   HCT 42.0 06/17/2018   PLT 210 06/17/2018   GLUCOSE 91 09/25/2018   CHOL 157 06/20/2018   TRIG 81 06/20/2018   HDL 58 06/20/2018   LDLCALC 83 06/20/2018   ALT 11 09/25/2018   AST 16 09/25/2018   NA 144 09/25/2018   K 5.2 09/25/2018   CL 105 09/25/2018   CREATININE 0.79 09/25/2018   BUN 16 09/25/2018   CO2 25 09/25/2018   TSH 1.270 12/25/2017   HGBA1C 5.6 09/25/2018    Mm 3d Screen Breast Bilateral  Result Date: 01/08/2019 CLINICAL DATA:  Screening. EXAM: DIGITAL SCREENING BILATERAL MAMMOGRAM WITH TOMO AND CAD COMPARISON:  Previous exam(s). ACR Breast Density Category b: There are scattered areas of fibroglandular density. FINDINGS: There are no findings suspicious for malignancy. Images were processed with CAD. IMPRESSION: No mammographic evidence of malignancy. A result letter of this screening mammogram will be mailed directly to the patient. RECOMMENDATION: Screening mammogram in one year. (Code:SM-B-01Y) BI-RADS CATEGORY  1: Negative. Electronically Signed   By: Everlean Alstrom M.D.   On: 01/08/2019 12:21    Assessment & Plan:   There are no diagnoses linked to this encounter.   No orders of the defined types were placed in this encounter.    Follow-up: No follow-ups on file.  Walker Kehr, MD

## 2019-10-21 NOTE — Assessment & Plan Note (Signed)
Vit D 

## 2019-10-21 NOTE — Assessment & Plan Note (Signed)
OTC

## 2019-10-21 NOTE — Assessment & Plan Note (Signed)
Robaxin prn 

## 2019-10-21 NOTE — Patient Instructions (Signed)
If you have medicare related insurance (such as traditional Medicare, Blue Cross Medicare, United HealthCare Medicare, or similar), Please make an appointment at the scheduling desk with Jill, the Wellness Health Coach, for your Wellness visit in this office, which is a benefit with your insurance.  

## 2019-10-21 NOTE — Assessment & Plan Note (Signed)
Labs

## 2019-10-21 NOTE — Assessment & Plan Note (Signed)
Xanax prn  Potential benefits of a long term benzodiazepines  use as well as potential risks  and complications were explained to the patient and were aknowledged. 

## 2019-10-21 NOTE — Assessment & Plan Note (Signed)
On Amlodipine 

## 2019-10-27 ENCOUNTER — Encounter: Payer: Self-pay | Admitting: Internal Medicine

## 2019-12-09 ENCOUNTER — Ambulatory Visit: Payer: Self-pay | Admitting: Internal Medicine

## 2019-12-09 NOTE — Telephone Encounter (Signed)
Please advise 

## 2019-12-09 NOTE — Telephone Encounter (Signed)
Pt reports BP Saturday , prior to taking Norvas,c 159/78..  45 minutes after med, 140's /70's.  Pt states had headache prior to taking meds, resolved after meds.   This AM 155/74, "Relaxed" down to 147/74.  Denies headache this AM, no other symptoms.   States "Under a lot of stress lately" HAs been taking Xanax 2xs daily for 2 weeks.  Reports BP usually 137/70's. New home monitor.  Pt states can do virtual if needed. Care advise given per protocol.   Please advise: [phone]  Answer Assessment - Initial Assessment Questions 1. BLOOD PRESSURE: "What is the blood pressure?" "Did you take at least two measurements 5 minutes apart?"     155/74  Before med,  147/74 45 minutes after med. 2. ONSET: "When did you take your blood pressure?"    This Am 3. HOW: "How did you obtain the blood pressure?" (e.g., visiting nurse, automatic home BP monitor)    Home monitor 4. HISTORY: "Do you have a history of high blood pressure?"     yes 5. MEDICATIONS: "Are you taking any medications for blood pressure?" "Have you missed any doses recently?"     Yes, no missed doses 6. OTHER SYMPTOMS: "Do you have any symptoms?" (e.g., headache, chest pain, blurred vision, difficulty breathing, weakness)     "Lots of stress lately."   Headache over weekend,resolved after taking BP med, not presently.  Protocols used: HIGH BLOOD PRESSURE-A-AH

## 2019-12-10 MED ORDER — IRBESARTAN 150 MG PO TABS: 150.0000 mg | ORAL_TABLET | Freq: Every day | ORAL | 3 refills | Status: DC

## 2019-12-10 NOTE — Telephone Encounter (Signed)
Add Irbesartan 1 a day - Rx emailed RTC 1-2 wks - ok VOV w/BP record Thx

## 2019-12-10 NOTE — Telephone Encounter (Signed)
Pt.notified

## 2019-12-10 NOTE — Addendum Note (Signed)
Addended by: Cassandria Anger on: 12/10/2019 07:33 AM   Modules accepted: Orders

## 2019-12-13 ENCOUNTER — Other Ambulatory Visit: Payer: Self-pay

## 2019-12-13 ENCOUNTER — Emergency Department (INDEPENDENT_AMBULATORY_CARE_PROVIDER_SITE_OTHER)
Admission: EM | Admit: 2019-12-13 | Discharge: 2019-12-13 | Disposition: A | Discharge status: Home / Self Care | Payer: Medicare Other | Source: Home / Self Care

## 2019-12-13 DIAGNOSIS — U071 COVID-19: Secondary | ICD-10-CM | POA: Diagnosis not present

## 2019-12-13 LAB — POC SARS CORONAVIRUS 2 AG -  ED: SARS Coronavirus 2 Ag: POSITIVE — AB

## 2019-12-13 MED ORDER — ACETAMINOPHEN 325 MG PO TABS
650.0000 mg | ORAL_TABLET | Freq: Once | ORAL | Status: AC
  Administered 2019-12-13: 17:00:00 650 mg via ORAL

## 2019-12-13 NOTE — ED Provider Notes (Signed)
Diane Sawyer CARE    CSN: [id number] Arrival date & time: 12/13/19  1611      History   Chief Complaint Chief Complaint  Patient presents with  . Nasal Congestion  . Fever    HPI DELAIN HAAGENSEN is a 74 y.o. female.   The history is provided by the patient. No language interpreter was used.  Fever Max temp prior to arrival:  102 Temp source:  Oral Timing:  Constant Progression:  Worsening Chronicity:  New Worsened by:  Nothing Risk factors: no recent sickness and no sick contacts     Past Medical History:  Diagnosis Date  . Adenomatous polyp of colon 2004  . Allergic rhinitis   . Anemia   . Anxiety   . Back pain   . Cataract 2018  . Diverticulosis   . GERD (gastroesophageal reflux disease)   . Glaucoma   . Hypertension   . Lumbar compression fracture ([place]) 09/07/2016   x 2   . Menopause   . Vitamin D deficiency     Patient Active Problem List   Diagnosis Date Noted  . Dysuria 04/25/2019  . Insulin resistance 01/14/2019  . Prediabetes 01/08/2018  . Other fatigue 12/25/2017  . Shortness of breath on exertion 12/25/2017  . Hyperglycemia 12/25/2017  . Class 1 obesity with serious comorbidity and body mass index (BMI) of 32.0 to 32.9 in adult 12/25/2017  . Low back pain 11/21/2016  . Rash and nonspecific skin eruption 11/10/2014  . Left shoulder pain 11/18/2013  . Nonspecific abnormal electrocardiogram (ECG) (EKG) 11/18/2013  . Dizziness 11/18/2013  . Well adult exam 11/12/2012  . Anxiety 11/12/2012  . Hypertension 11/07/2011  . Insomnia 11/07/2011  . PHARYNGITIS 08/31/2009  . Vitamin D deficiency 09/01/2008  . ANEMIA-IRON DEFICIENCY 09/01/2008  . ALLERGIC RHINITIS 09/01/2008  . GERD 09/01/2008  . ADENOMATOUS COLONIC POLYP 12/05/2007  . HEMORRHOIDS, INTERNAL 12/05/2007  . Hiatal hernia 12/05/2007  . DIVERTICULOSIS, COLON 12/05/2007  . Osteopenia 12/05/2007    Past Surgical History:  Procedure Laterality Date  . CATARACT EXTRACTION,  BILATERAL  04/06/2017   with cypass stent implanted  . COLONOSCOPY    . cypess stent left eye Left 2018  . POLYPECTOMY      OB History    Gravida  2   Para      Term      Preterm      AB      Living  2     SAB      TAB      Ectopic      Multiple      Live Births               Home Medications    Prior to Admission medications   Medication Sig Start Date End Date Taking? Authorizing Provider  acetaminophen (TYLENOL) 500 MG tablet Take 1,000 mg by mouth every 6 (six) hours as needed for mild pain.    [provider]  ALPRAZolam Duanne Moron) 0.25 MG tablet Take 1 tablet (0.25 mg total) by mouth 2 (two) times daily as needed. for anxiety 10/21/19   Plotnikov, Evie Lacks, MD  amLODipine (NORVASC) 5 MG tablet Take 1 tablet (5 mg total) by mouth daily. 07/01/19   Plotnikov, Evie Lacks, MD  clobetasol cream (TEMOVATE) AB-123456789 % Apply 1 application topically daily as needed (dermatosis). Reported on 05/23/2016 02/16/15   [provider]  dorzolamide (TRUSOPT) 2 % ophthalmic solution INT 1 GTT IN OD TID 06/03/19  [provider]  etodolac (LODINE) 500 MG tablet Take 500 mg by mouth as needed.     [provider]  fexofenadine (ALLEGRA) 180 MG tablet Take 180 mg by mouth daily.      [provider]  irbesartan (AVAPRO) 150 MG tablet Take 1 tablet (150 mg total) by mouth daily. 12/10/19   Plotnikov, Evie Lacks, MD  IRON PO Take 65 mg by mouth daily.     [provider]  latanoprost (XALATAN) 0.005 % ophthalmic solution INT 1 GTT IN OU HS 06/04/19   [provider]  methocarbamol (ROBAXIN) 500 MG tablet Take 1 tablet (500 mg total) by mouth every 8 (eight) hours as needed for muscle spasms. 10/21/19   Plotnikov, Evie Lacks, MD  pantoprazole (PROTONIX) 40 MG tablet Take 1 tablet (40 mg total) by mouth 2 (two) times daily. 07/01/19   Plotnikov, Evie Lacks, MD  timolol (BETIMOL) 0.25 % ophthalmic solution Place 1 drop into the right eye  daily.     [provider]  timolol (TIMOPTIC) 0.5 % ophthalmic solution INT 1 GTT IN OU BID 06/27/19   [provider]  VITAMIN D PO Take 1,000 Units by mouth daily.    [provider]    Family History Family History  Problem Relation Age of Onset  . Stroke Mother 62  . Hypertension Mother   . Sudden death Mother   . Anxiety disorder Mother   . Lung cancer Father 22  . Colon cancer Neg Hx   . Esophageal cancer Neg Hx   . Stomach cancer Neg Hx   . Rectal cancer Neg Hx     Social History Social History   Tobacco Use  . Smoking status: Never Smoker  . Smokeless tobacco: Never Used  Substance Use Topics  . Alcohol use: No  . Drug use: No     Allergies   Benazepril hcl, Levofloxacin, and Mobic [meloxicam]   Review of Systems Review of Systems  Constitutional: Positive for fever.  All other systems reviewed and are negative.    Physical Exam Triage Vital Signs ED Triage Vitals  Enc Vitals Group     BP 12/13/19 1650 (!) 137/107     Pulse Rate 12/13/19 1650 (!) 114     Resp 12/13/19 1650 18     Temp 12/13/19 1650 (!) 102.5 F (39.2 C)     Temp Source 12/13/19 1650 Oral     SpO2 12/13/19 1650 94 %     Weight 12/13/19 1651 180 lb 12.4 oz (82 kg)     Height 12/13/19 1651 5\' 3"  (1.6 m)     Head Circumference --      Peak Flow --      Pain Score 12/13/19 1651 0     Pain Loc --      Pain Edu? --      Excl. in [place]? --    No data found.  Updated Vital Signs BP (!) 137/107 (BP Location: Right Arm)   Pulse (!) 114   Temp (!) 102.5 F (39.2 C) (Oral)   Resp 18   Ht 5\' 3"  (1.6 m)   Wt 82 kg   SpO2 94%   BMI 32.02 kg/m   Visual Acuity Right Eye Distance:   Left Eye Distance:   Bilateral Distance:    Right Eye Near:   Left Eye Near:    Bilateral Near:     Physical Exam Vitals and nursing note reviewed.  Constitutional:  Appearance: She is well-developed.  HENT:     Head: Normocephalic.  Cardiovascular:     Rate and  Rhythm: Normal rate.  Pulmonary:     Effort: Pulmonary effort is normal.  Abdominal:     General: There is no distension.  Musculoskeletal:        General: Normal range of motion.     Cervical back: Normal range of motion.  Skin:    General: Skin is warm.  Neurological:     General: No focal deficit present.     Mental Status: She is alert and oriented to person, place, and time.  Psychiatric:        Mood and Affect: Mood normal.      UC Treatments / Results  Labs (all labs ordered are listed, but only abnormal results are displayed) Labs Reviewed  POC SARS CORONAVIRUS 2 AG -  ED - Abnormal; Notable for the following components:      Result Value   SARS Coronavirus 2 Ag Positive (*)    All other components within normal limits    EKG   Radiology No results found.  Procedures Procedures (including critical care time)  Medications Ordered in UC Medications  acetaminophen (TYLENOL) tablet 650 mg (650 mg Oral Given 12/13/19 1659)    Initial Impression / Assessment and Plan / UC Course  I have reviewed the triage vital signs and the nursing notes.  Pertinent labs & imaging results that were available during my care of the patient were reviewed by me and considered in my medical decision making (see chart for details).     MDM Pt has a positive covid  Final Clinical Impressions(s) / UC Diagnoses   Final diagnoses:  [id number]   Discharge Instructions   None    ED Prescriptions    None     PDMP not reviewed this encounter.  An After Visit Summary was printed and given to the patient.    Fransico Meadow, [place] 12/13/19 1719

## 2019-12-13 NOTE — ED Triage Notes (Signed)
Pt here today for sinus congestion and fever. Mentions nearly fainting last night. Also concerned about recent elevated BP. Was Rx'd irbesartan this week by PCP.

## 2019-12-14 ENCOUNTER — Other Ambulatory Visit: Payer: Self-pay | Admitting: Unknown Physician Specialty

## 2019-12-14 ENCOUNTER — Telehealth: Payer: Self-pay | Admitting: Unknown Physician Specialty

## 2019-12-14 DIAGNOSIS — U071 COVID-19: Secondary | ICD-10-CM

## 2019-12-14 NOTE — Progress Notes (Signed)
  I connected by phone with Diane Sawyer on 12/14/2019 at 1:13 PM to discuss the potential use of an new treatment for mild to moderate COVID-19 viral infection in non-hospitalized patients.  This patient is a 74 y.o. female that meets the FDA criteria for Emergency Use Authorization of bamlanivimab or casirivimab\imdevimab.  Has a (+) direct SARS-CoV-2 viral test result  Has mild or moderate COVID-19   Is ? 74 years of age and weighs ? 40 kg  Is NOT hospitalized due to COVID-19  Is NOT requiring oxygen therapy or requiring an increase in baseline oxygen flow rate due to COVID-19  Is within 10 days of symptom onset  Has at least one of the high risk factor(s) for progression to severe COVID-19 and/or hospitalization as defined in EUA.  Specific high risk criteria : >/= 73 yo and other co-morbid conditions   I have spoken and communicated the following to the patient or parent/caregiver:  1. FDA has authorized the emergency use of bamlanivimab and casirivimab\imdevimab for the treatment of mild to moderate COVID-19 in adults and pediatric patients with positive results of direct SARS-CoV-2 viral testing who are 92 years of age and older weighing at least 40 kg, and who are at high risk for progressing to severe COVID-19 and/or hospitalization.  2. The significant known and potential risks and benefits of bamlanivimab and casirivimab\imdevimab, and the extent to which such potential risks and benefits are unknown.  3. Information on available alternative treatments and the risks and benefits of those alternatives, including clinical trials.  4. Patients treated with bamlanivimab and casirivimab\imdevimab should continue to self-isolate and use infection control measures (e.g., wear mask, isolate, social distance, avoid sharing personal items, clean and disinfect "high touch" surfaces, and frequent handwashing) according to CDC guidelines.   5. The patient or parent/caregiver has the option  to accept or refuse bamlanivimab or casirivimab\imdevimab .  After reviewing this information with the patient, The patient agreed to proceed with receiving the bamlanimivab infusion and will be provided a copy of the Fact sheet prior to receiving the infusion.[name] 12/14/2019 1:13 PM

## 2019-12-14 NOTE — Telephone Encounter (Signed)
  I connected by phone with Diane Sawyer on 12/14/2019 at 1:02 PM to discuss the potential use of an new treatment for mild to moderate COVID-19 viral infection in non-hospitalized patients.  This patient is a 74 y.o. female that meets the FDA criteria for Emergency Use Authorization of bamlanivimab or casirivimab\imdevimab.  Has a (+) direct SARS-CoV-2 viral test result  Has mild or moderate COVID-19   Is ? 74 years of age and weighs ? 40 kg  Is NOT hospitalized due to COVID-19  Is NOT requiring oxygen therapy or requiring an increase in baseline oxygen flow rate due to COVID-19  Is within 10 days of symptom onset  Has at least one of the high risk factor(s) for progression to severe COVID-19 and/or hospitalization as defined in EUA.  Specific high risk criteria : >/= 73 yo   I have spoken and communicated the following to the patient or parent/caregiver:  1. FDA has authorized the emergency use of bamlanivimab and casirivimab\imdevimab for the treatment of mild to moderate COVID-19 in adults and pediatric patients with positive results of direct SARS-CoV-2 viral testing who are 66 years of age and older weighing at least 40 kg, and who are at high risk for progressing to severe COVID-19 and/or hospitalization.  2. The significant known and potential risks and benefits of bamlanivimab and casirivimab\imdevimab, and the extent to which such potential risks and benefits are unknown.  3. Information on available alternative treatments and the risks and benefits of those alternatives, including clinical trials.  4. Patients treated with bamlanivimab and casirivimab\imdevimab should continue to self-isolate and use infection control measures (e.g., wear mask, isolate, social distance, avoid sharing personal items, clean and disinfect "high touch" surfaces, and frequent handwashing) according to CDC guidelines.   5. The patient or parent/caregiver has the option to accept or refuse  bamlanivimab or casirivimab\imdevimab .  After reviewing this information with the patient, The patient agreed to proceed with receiving the bamlanimivab infusion and will be provided a copy of the Fact sheet prior to receiving the infusion.   [name] 12/14/2019 1:02 PM      Sx onset 1/5

## 2019-12-14 NOTE — Telephone Encounter (Signed)
Called to discuss with patient about Covid symptoms and the use of bamlanivimab, a monoclonal antibody infusion for those with mild to moderate Covid symptoms and at a high risk of hospitalization.  Pt is qualified for this infusion at the [facility] due to Age > 65   Message left to call back  

## 2019-12-16 ENCOUNTER — Telehealth: Payer: Self-pay

## 2019-12-16 NOTE — Telephone Encounter (Signed)
Try Afrin OTC nasal spray for congestion x 5 d Avapro - try 1/2 tab bid Thx

## 2019-12-16 NOTE — Telephone Encounter (Signed)
Copied from [place] [id number]. Topic: General - Other >> Dec 13, 2019 12:43 PM Leward Quan A wrote: Reason for CRM: Patient called to inform Dr Alain Marion that she got light headed while taking her shower last night and almost fell. States that she is not sure if it is the new medication irbesartan (AVAPRO) 150 MG tablet , she also states that she has been having dry mouth and is still very congested. She is asking for a medication to be called in to her pharmacy please for thecongestion. Please call patient at Ph# [phone]

## 2019-12-17 ENCOUNTER — Ambulatory Visit (HOSPITAL_COMMUNITY)
Admission: RE | Admit: 2019-12-17 | Discharge: 2019-12-17 | Disposition: A | Discharge status: Home / Self Care | Payer: Medicare Other | Source: Ambulatory Visit | Attending: Pulmonary Disease | Admitting: Pulmonary Disease

## 2019-12-17 DIAGNOSIS — Z23 Encounter for immunization: Secondary | ICD-10-CM | POA: Diagnosis not present

## 2019-12-17 DIAGNOSIS — U071 COVID-19: Secondary | ICD-10-CM | POA: Insufficient documentation

## 2019-12-17 MED ORDER — METHYLPREDNISOLONE SODIUM SUCC 125 MG IJ SOLR: 125.0000 mg | Freq: Once | INTRAMUSCULAR | Status: DC | PRN

## 2019-12-17 MED ORDER — FAMOTIDINE IN NACL 20-0.9 MG/50ML-% IV SOLN: 20.0000 mg | Freq: Once | INTRAVENOUS | Status: DC | PRN

## 2019-12-17 MED ORDER — SODIUM CHLORIDE 0.9 % IV SOLN
700.0000 mg | Freq: Once | INTRAVENOUS | Status: AC
  Administered 2019-12-17: 700 mg via INTRAVENOUS
  Filled 2019-12-17: qty 20

## 2019-12-17 MED ORDER — DIPHENHYDRAMINE HCL 50 MG/ML IJ SOLN: 50.0000 mg | Freq: Once | INTRAMUSCULAR | Status: DC | PRN

## 2019-12-17 MED ORDER — EPINEPHRINE 0.3 MG/0.3ML IJ SOAJ: 0.3000 mg | Freq: Once | INTRAMUSCULAR | Status: DC | PRN

## 2019-12-17 MED ORDER — SODIUM CHLORIDE 0.9 % IV SOLN
INTRAVENOUS | Status: DC | PRN
  Administered 2019-12-17: 250 mL via INTRAVENOUS

## 2019-12-17 MED ORDER — ALBUTEROL SULFATE HFA 108 (90 BASE) MCG/ACT IN AERS: 2.0000 | INHALATION_SPRAY | Freq: Once | RESPIRATORY_TRACT | Status: DC | PRN

## 2019-12-17 NOTE — Progress Notes (Signed)
  Diagnosis: COVID-19  Physician: Dr. Joya Gaskins  Procedure: [facility] Med: bamlanivimab infusion - Provided patient with bamlanimivab fact sheet for patients, parents and caregivers prior to infusion.  Complications: No immediate complications noted.  Discharge: Discharged home   Babs Sciara 12/17/2019

## 2019-12-17 NOTE — Discharge Instructions (Signed)

## 2019-12-17 NOTE — Telephone Encounter (Signed)
Mychart message sent to patient.

## 2019-12-25 ENCOUNTER — Encounter: Payer: Self-pay | Admitting: Internal Medicine

## 2019-12-25 ENCOUNTER — Ambulatory Visit (INDEPENDENT_AMBULATORY_CARE_PROVIDER_SITE_OTHER): Payer: Medicare Other | Admitting: Internal Medicine

## 2019-12-25 DIAGNOSIS — U071 COVID-19: Secondary | ICD-10-CM

## 2019-12-25 DIAGNOSIS — I10 Essential (primary) hypertension: Secondary | ICD-10-CM | POA: Diagnosis not present

## 2019-12-25 DIAGNOSIS — Z8616 Personal history of COVID-19: Secondary | ICD-10-CM | POA: Insufficient documentation

## 2019-12-25 DIAGNOSIS — R55 Syncope and collapse: Secondary | ICD-10-CM | POA: Insufficient documentation

## 2019-12-25 NOTE — Progress Notes (Signed)
Virtual Visit via Telephone Note  I connected with Diane Sawyer on 12/25/19 at  8:10 AM EST by telephone and verified that I am speaking with the correct person using two identifiers.   I discussed the limitations, risks, security and privacy concerns of performing an evaluation and management service by telephone and the availability of in person appointments. I also discussed with the patient that there may be a patient responsible charge related to this service. The patient expressed understanding and agreed to proceed.   History of Present Illness: Diane Sawyer is complaining of being diagnosed with COVID-19 infection on 12/13/2019.  She had continues to be weak and tired.  She was treated with COVID-19 antibody infusion which helped.  She had a bout of diarrhea that has resolved.  She had a problem with high blood pressure and we added irbesartan 160 mg daily.  She thinks it is making her mouth dry.  She continues to take Norvasc.  Her blood pressure is 136/86.  She continues to have headaches, however, they are getting better.  None of her family is sick.  She has not been working since the beginning of the year. Observations/Objective:  The patient sounds normal on the phone Assessment and Plan:  See assessment and plan Follow Up Instructions:    I discussed the assessment and treatment plan with the patient. The patient was provided an opportunity to ask questions and all were answered. The patient agreed with the plan and demonstrated an understanding of the instructions.   The patient was advised to call back or seek an in-person evaluation if the symptoms worsen or if the condition fails to improve as anticipated.  I provided 21 minutes of non-face-to-face time during this encounter.   Walker Kehr, MD

## 2019-12-25 NOTE — Assessment & Plan Note (Signed)
status post AB infusion.  We discussed multiple issues on the phone.

## 2019-12-25 NOTE — Assessment & Plan Note (Signed)
Worse.  She is on amlodipine.  Irbesartan was added at 150 mg daily.  She seems to tolerate it well except for dry mouth.  Will use one half of the tablet if problems.

## 2019-12-25 NOTE — Assessment & Plan Note (Signed)
Vasovagal, likely due to COVID-19 infection No relapse.  Continue with good hydration

## 2020-01-06 ENCOUNTER — Ambulatory Visit: Payer: Medicare Other

## 2020-01-13 ENCOUNTER — Ambulatory Visit (INDEPENDENT_AMBULATORY_CARE_PROVIDER_SITE_OTHER): Payer: Medicare Other | Admitting: Internal Medicine

## 2020-01-13 ENCOUNTER — Encounter: Payer: Self-pay | Admitting: Internal Medicine

## 2020-01-13 ENCOUNTER — Other Ambulatory Visit: Payer: Self-pay

## 2020-01-13 DIAGNOSIS — E559 Vitamin D deficiency, unspecified: Secondary | ICD-10-CM

## 2020-01-13 DIAGNOSIS — K219 Gastro-esophageal reflux disease without esophagitis: Secondary | ICD-10-CM | POA: Diagnosis not present

## 2020-01-13 DIAGNOSIS — R05 Cough: Secondary | ICD-10-CM | POA: Diagnosis not present

## 2020-01-13 DIAGNOSIS — R059 Cough, unspecified: Secondary | ICD-10-CM

## 2020-01-13 MED ORDER — BENZONATATE 200 MG PO CAPS: 200.0000 mg | ORAL_CAPSULE | Freq: Three times a day (TID) | ORAL | 0 refills | Status: DC | PRN

## 2020-01-13 NOTE — Assessment & Plan Note (Signed)
Protonix.  ?

## 2020-01-13 NOTE — Progress Notes (Signed)
Subjective:  Patient ID: Diane Sawyer, female    DOB: 1946/04/15  Age: 74 y.o. MRN: [id number]  CC: No chief complaint on file.   HPI THIENAN WESTOVER presents for post-COVID19 hacky cough, clearing throat  Outpatient Medications Prior to Visit  Medication Sig Dispense Refill  . acetaminophen (TYLENOL) 500 MG tablet Take 1,000 mg by mouth every 6 (six) hours as needed for mild pain.    [name] ALPRAZolam (XANAX) 0.25 MG tablet Take 1 tablet (0.25 mg total) by mouth 2 (two) times daily as needed. for anxiety 60 tablet 3  . amLODipine (NORVASC) 5 MG tablet Take 1 tablet (5 mg total) by mouth daily. 90 tablet 3  . clobetasol cream (TEMOVATE) [id number] % Apply 1 application topically daily as needed (dermatosis). Reported on 05/23/2016    . dorzolamide (TRUSOPT) 2 % ophthalmic solution INT 1 GTT IN OD TID    . etodolac (LODINE) 500 MG tablet Take 500 mg by mouth as needed.     . fexofenadine (ALLEGRA) 180 MG tablet Take 180 mg by mouth daily.      . irbesartan (AVAPRO) 150 MG tablet Take 1 tablet (150 mg total) by mouth daily. 90 tablet 3  . IRON PO Take 65 mg by mouth daily.     [name] latanoprost (XALATAN) 0.005 % ophthalmic solution INT 1 GTT IN OU HS    . methocarbamol (ROBAXIN) 500 MG tablet Take 1 tablet (500 mg total) by mouth every 8 (eight) hours as needed for muscle spasms. 60 tablet 1  . pantoprazole (PROTONIX) 40 MG tablet Take 1 tablet (40 mg total) by mouth 2 (two) times daily. 180 tablet 3  . timolol (BETIMOL) 0.25 % ophthalmic solution Place 1 drop into the right eye daily.     . timolol (TIMOPTIC) 0.5 % ophthalmic solution INT 1 GTT IN OU BID    . VITAMIN D PO Take 1,000 Units by mouth daily.     No facility-administered medications prior to visit.    ROS: Review of Systems  Constitutional: Negative for activity change, appetite change, chills, fatigue and unexpected weight change.  HENT: Negative for congestion, mouth sores and sinus pressure.   Eyes: Negative for visual disturbance.    Respiratory: Negative for cough and chest tightness.   Gastrointestinal: Negative for abdominal pain and nausea.  Genitourinary: Negative for difficulty urinating, frequency and vaginal pain.  Musculoskeletal: Negative for back pain and gait problem.  Skin: Negative for pallor and rash.  Neurological: Negative for dizziness, tremors, weakness, numbness and headaches.  Psychiatric/Behavioral: Negative for confusion, sleep disturbance and suicidal ideas.    Objective:  BP 130/82 (BP Location: Left Arm, Patient Position: Sitting, Cuff Size: Normal)   Pulse 86   Temp 98.5 F (36.9 C) (Oral)   Ht 5\' 3"  (1.6 m)   Wt 177 lb (80.3 kg)   SpO2 98%   BMI 31.35 kg/m   BP Readings from Last 3 Encounters:  01/13/20 130/82  12/17/19 106/78  12/13/19 (!) 137/107    Wt Readings from Last 3 Encounters:  01/13/20 177 lb (80.3 kg)  12/13/19 180 lb 12.4 oz (82 kg)  10/21/19 181 lb (82.1 kg)    Physical Exam Constitutional:      General: She is not in acute distress.    Appearance: She is well-developed. She is obese.  HENT:     Head: Normocephalic.     Right Ear: External ear normal.     Left Ear: External ear normal.  Nose: Nose normal.  Eyes:     General:        Right eye: No discharge.        Left eye: No discharge.     Conjunctiva/sclera: Conjunctivae normal.     Pupils: Pupils are equal, round, and reactive to light.  Neck:     Thyroid: No thyromegaly.     Vascular: No JVD.     Trachea: No tracheal deviation.  Cardiovascular:     Rate and Rhythm: Normal rate and regular rhythm.     Heart sounds: Normal heart sounds.  Pulmonary:     Effort: No respiratory distress.     Breath sounds: No stridor. No wheezing.  Abdominal:     General: Bowel sounds are normal. There is no distension.     Palpations: Abdomen is soft. There is no mass.     Tenderness: There is no abdominal tenderness. There is no guarding or rebound.  Musculoskeletal:        General: No tenderness.      Cervical back: Normal range of motion and neck supple.  Lymphadenopathy:     Cervical: No cervical adenopathy.  Skin:    Findings: No erythema or rash.  Neurological:     Cranial Nerves: No cranial nerve deficit.     Motor: No abnormal muscle tone.     Coordination: Coordination normal.     Deep Tendon Reflexes: Reflexes normal.  Psychiatric:        Behavior: Behavior normal.        Thought Content: Thought content normal.        Judgment: Judgment normal.     Lab Results  Component Value Date   WBC 6.4 10/21/2019   HGB 14.6 10/21/2019   HCT 43.8 10/21/2019   PLT 223.0 10/21/2019   GLUCOSE 101 (H) 10/21/2019   CHOL 192 10/21/2019   TRIG 79.0 10/21/2019   HDL 65.10 10/21/2019   LDLCALC 111 (H) 10/21/2019   ALT 9 10/21/2019   AST 13 10/21/2019   NA 140 10/21/2019   K 4.6 10/21/2019   CL 105 10/21/2019   CREATININE 0.68 10/21/2019   BUN 14 10/21/2019   CO2 26 10/21/2019   TSH 0.99 10/21/2019   HGBA1C 5.7 10/21/2019    No results found.  Assessment & Plan:   There are no diagnoses linked to this encounter.   No orders of the defined types were placed in this encounter.    Follow-up: No follow-ups on file.  Walker Kehr, MD

## 2020-01-13 NOTE — Assessment & Plan Note (Signed)
Post-COVID19 cough -- Tessalon prn Call if not better

## 2020-01-13 NOTE — Assessment & Plan Note (Signed)
On Vit D 

## 2020-01-15 ENCOUNTER — Other Ambulatory Visit: Payer: Self-pay

## 2020-01-15 ENCOUNTER — Encounter: Payer: Self-pay | Admitting: Family

## 2020-01-15 ENCOUNTER — Ambulatory Visit (INDEPENDENT_AMBULATORY_CARE_PROVIDER_SITE_OTHER): Payer: Medicare Other | Admitting: Family

## 2020-01-15 VITALS — BP 130/76 | HR 76 | Temp 98.0°F | Ht 63.0 in | Wt 178.8 lb

## 2020-01-15 DIAGNOSIS — R300 Dysuria: Secondary | ICD-10-CM

## 2020-01-15 LAB — POC URINALSYSI DIPSTICK (AUTOMATED)
Bilirubin, UA: NEGATIVE
Blood, UA: NEGATIVE
Glucose, UA: NEGATIVE
Ketones, UA: NEGATIVE
Leukocytes, UA: NEGATIVE
Nitrite, UA: NEGATIVE
Protein, UA: NEGATIVE
Spec Grav, UA: 1.025 (ref 1.010–1.025)
Urobilinogen, UA: 0.2 E.U./dL
pH, UA: 6 (ref 5.0–8.0)

## 2020-01-15 MED ORDER — SULFAMETHOXAZOLE-TRIMETHOPRIM 800-160 MG PO TABS: 1.0000 | ORAL_TABLET | Freq: Two times a day (BID) | ORAL | 0 refills | Status: DC

## 2020-01-15 NOTE — Progress Notes (Signed)
Diane Sawyer is a 74 y.o. female with the following history as recorded in [employer]:  Patient Active Problem List   Diagnosis Date Noted  . Cough 01/13/2020  . COVID-19 virus infection 12/25/2019  . Syncope and collapse 12/25/2019  . Dysuria 04/25/2019  . Insulin resistance 01/14/2019  . Prediabetes 01/08/2018  . Other fatigue 12/25/2017  . Shortness of breath on exertion 12/25/2017  . Hyperglycemia 12/25/2017  . Class 1 obesity with serious comorbidity and body mass index (BMI) of 32.0 to 32.9 in adult 12/25/2017  . Low back pain 11/21/2016  . Rash and nonspecific skin eruption 11/10/2014  . Left shoulder pain 11/18/2013  . Nonspecific abnormal electrocardiogram (ECG) (EKG) 11/18/2013  . Dizziness 11/18/2013  . Well adult exam 11/12/2012  . Anxiety 11/12/2012  . Hypertension 11/07/2011  . Insomnia 11/07/2011  . PHARYNGITIS 08/31/2009  . Vitamin D deficiency 09/01/2008  . ANEMIA-IRON DEFICIENCY 09/01/2008  . ALLERGIC RHINITIS 09/01/2008  . GERD 09/01/2008  . ADENOMATOUS COLONIC POLYP 12/05/2007  . HEMORRHOIDS, INTERNAL 12/05/2007  . Hiatal hernia 12/05/2007  . DIVERTICULOSIS, COLON 12/05/2007  . Osteopenia 12/05/2007    Current Outpatient Medications  Medication Sig Dispense Refill  . acetaminophen (TYLENOL) 500 MG tablet Take 1,000 mg by mouth every 6 (six) hours as needed for mild pain.    [name] ALPRAZolam (XANAX) 0.25 MG tablet Take 1 tablet (0.25 mg total) by mouth 2 (two) times daily as needed. for anxiety 60 tablet 3  . amLODipine (NORVASC) 5 MG tablet Take 1 tablet (5 mg total) by mouth daily. 90 tablet 3  . clobetasol cream (TEMOVATE) [id number] % Apply 1 application topically daily as needed (dermatosis). Reported on 05/23/2016    . dorzolamide (TRUSOPT) 2 % ophthalmic solution INT 1 GTT IN OD TID    . etodolac (LODINE) 500 MG tablet Take 500 mg by mouth as needed.     . fexofenadine (ALLEGRA) 180 MG tablet Take 180 mg by mouth daily.      . irbesartan (AVAPRO) 150 MG tablet  Take 1 tablet (150 mg total) by mouth daily. 90 tablet 3  . IRON PO Take 65 mg by mouth daily.     [name] latanoprost (XALATAN) 0.005 % ophthalmic solution INT 1 GTT IN OU HS    . methocarbamol (ROBAXIN) 500 MG tablet Take 1 tablet (500 mg total) by mouth every 8 (eight) hours as needed for muscle spasms. 60 tablet 1  . pantoprazole (PROTONIX) 40 MG tablet Take 1 tablet (40 mg total) by mouth 2 (two) times daily. 180 tablet 3  . timolol (BETIMOL) 0.25 % ophthalmic solution Place 1 drop into the right eye daily.     . timolol (TIMOPTIC) 0.5 % ophthalmic solution INT 1 GTT IN OU BID    . VITAMIN D PO Take 1,000 Units by mouth daily.    . benzonatate (TESSALON) 200 MG capsule Take 1 capsule (200 mg total) by mouth 3 (three) times daily as needed for cough. (Patient not taking: Reported on 01/15/2020) 30 capsule 0  . sulfamethoxazole-trimethoprim (BACTRIM DS) 800-160 MG tablet Take 1 tablet by mouth 2 (two) times daily. 10 tablet 0   No current facility-administered medications for this visit.    Allergies: Benazepril hcl, Levofloxacin, and Mobic [meloxicam]  Past Medical History:  Diagnosis Date  . Adenomatous polyp of colon 2004  . Allergic rhinitis   . Anemia   . Anxiety   . Back pain   . Cataract 2018  . Diverticulosis   . GERD (  gastroesophageal reflux disease)   . Glaucoma   . Hypertension   . Lumbar compression fracture ([place]) 09/07/2016   x 2   . Menopause   . Vitamin D deficiency     Past Surgical History:  Procedure Laterality Date  . CATARACT EXTRACTION, BILATERAL  04/06/2017   with cypass stent implanted  . COLONOSCOPY    . cypess stent left eye Left 2018  . POLYPECTOMY      Family History  Problem Relation Age of Onset  . Stroke Mother 52  . Hypertension Mother   . Sudden death Mother   . Anxiety disorder Mother   . Lung cancer Father 79  . Colon cancer Neg Hx   . Esophageal cancer Neg Hx   . Stomach cancer Neg Hx   . Rectal cancer Neg Hx     Social History    Tobacco Use  . Smoking status: Never Smoker  . Smokeless tobacco: Never Used  Substance Use Topics  . Alcohol use: No    Subjective:  Patient presents with concerns for possible UTI; burning, urgency, frequency x 1 day; no fever or unexplained back pain;   Objective:  Vitals:   01/15/20 0856  BP: 130/76  Pulse: 76  Temp: 98 F (36.7 C)  TempSrc: Oral  SpO2: 98%  Weight: 178 lb 12.8 oz (81.1 kg)  Height: 5\' 3"  (1.6 m)    General: Well developed, well nourished, in no acute distress  Skin : Warm and dry.  Head: Normocephalic and atraumatic  Lungs: Respirations unlabored; Neurologic: Alert and oriented; speech intact; face symmetrical;   Assessment:  1. Dysuria     Plan:  Check U/A and urine culture; Rx for Bactrim DS bid x 5 days;   This visit occurred during the SARS-CoV-2 public health emergency.  Safety protocols were in place, including screening questions prior to the visit, additional usage of staff PPE, and extensive cleaning of exam room while observing appropriate contact time as indicated for disinfecting solutions.     No follow-ups on file.  Orders Placed This Encounter  Procedures  . Urine Culture    Requested Prescriptions   Signed Prescriptions Disp Refills  . sulfamethoxazole-trimethoprim (BACTRIM DS) 800-160 MG tablet 10 tablet 0    Sig: Take 1 tablet by mouth 2 (two) times daily.

## 2020-01-15 NOTE — Addendum Note (Signed)
Addended by: Marcina Millard on: 01/15/2020 03:20 PM   Modules accepted: Orders

## 2020-01-16 LAB — URINE CULTURE: Result:: NO GROWTH

## 2020-01-20 DIAGNOSIS — H2511 Age-related nuclear cataract, right eye: Secondary | ICD-10-CM | POA: Diagnosis not present

## 2020-01-20 DIAGNOSIS — Z961 Presence of intraocular lens: Secondary | ICD-10-CM | POA: Diagnosis not present

## 2020-01-20 DIAGNOSIS — H401111 Primary open-angle glaucoma, right eye, mild stage: Secondary | ICD-10-CM | POA: Diagnosis not present

## 2020-01-20 DIAGNOSIS — H401122 Primary open-angle glaucoma, left eye, moderate stage: Secondary | ICD-10-CM | POA: Diagnosis not present

## 2020-01-20 DIAGNOSIS — H26492 Other secondary cataract, left eye: Secondary | ICD-10-CM | POA: Diagnosis not present

## 2020-02-03 ENCOUNTER — Telehealth: Payer: Self-pay | Admitting: Internal Medicine

## 2020-02-03 NOTE — Telephone Encounter (Signed)
New message:   Patient is calling to see if her Prolia shot has been approved through her insurance yet.

## 2020-02-04 NOTE — Telephone Encounter (Signed)
Appointment scheduled.

## 2020-02-06 ENCOUNTER — Other Ambulatory Visit: Payer: Self-pay

## 2020-02-06 ENCOUNTER — Ambulatory Visit (INDEPENDENT_AMBULATORY_CARE_PROVIDER_SITE_OTHER): Payer: Medicare Other | Admitting: *Deleted

## 2020-02-06 DIAGNOSIS — M810 Age-related osteoporosis without current pathological fracture: Secondary | ICD-10-CM | POA: Diagnosis not present

## 2020-02-06 MED ORDER — DENOSUMAB 60 MG/ML ~~LOC~~ SOSY
60.0000 mg | PREFILLED_SYRINGE | Freq: Once | SUBCUTANEOUS | Status: AC
  Administered 2020-02-06: 11:00:00 60 mg via SUBCUTANEOUS

## 2020-02-06 NOTE — Progress Notes (Signed)
Pls cosign for prolia../lmb 

## 2020-03-10 ENCOUNTER — Other Ambulatory Visit: Payer: Self-pay | Admitting: Internal Medicine

## 2020-03-10 DIAGNOSIS — Z1231 Encounter for screening mammogram for malignant neoplasm of breast: Secondary | ICD-10-CM

## 2020-03-12 ENCOUNTER — Ambulatory Visit
Admission: RE | Admit: 2020-03-12 | Discharge: 2020-03-12 | Disposition: A | Discharge status: Home / Self Care | Payer: Medicare Other | Source: Ambulatory Visit | Attending: Internal Medicine | Admitting: Internal Medicine

## 2020-03-12 ENCOUNTER — Other Ambulatory Visit: Payer: Self-pay

## 2020-03-12 DIAGNOSIS — Z1231 Encounter for screening mammogram for malignant neoplasm of breast: Secondary | ICD-10-CM | POA: Diagnosis not present

## 2020-04-20 ENCOUNTER — Ambulatory Visit (INDEPENDENT_AMBULATORY_CARE_PROVIDER_SITE_OTHER): Payer: Medicare Other | Admitting: Internal Medicine

## 2020-04-20 ENCOUNTER — Encounter: Payer: Self-pay | Admitting: Internal Medicine

## 2020-04-20 ENCOUNTER — Other Ambulatory Visit: Payer: Self-pay

## 2020-04-20 DIAGNOSIS — I10 Essential (primary) hypertension: Secondary | ICD-10-CM | POA: Diagnosis not present

## 2020-04-20 DIAGNOSIS — R609 Edema, unspecified: Secondary | ICD-10-CM

## 2020-04-20 DIAGNOSIS — E559 Vitamin D deficiency, unspecified: Secondary | ICD-10-CM

## 2020-04-20 DIAGNOSIS — R55 Syncope and collapse: Secondary | ICD-10-CM

## 2020-04-20 DIAGNOSIS — F419 Anxiety disorder, unspecified: Secondary | ICD-10-CM | POA: Diagnosis not present

## 2020-04-20 LAB — BASIC METABOLIC PANEL
BUN: 16 mg/dL (ref 6–23)
CO2: 28 mEq/L (ref 19–32)
Calcium: 8.8 mg/dL (ref 8.4–10.5)
Chloride: 105 mEq/L (ref 96–112)
Creatinine, Ser: 0.74 mg/dL (ref 0.40–1.20)
GFR: 76.97 mL/min (ref >60.00)
Glucose, Bld: 81 mg/dL (ref 70–99)
Potassium: 4.2 mEq/L (ref 3.5–5.1)
Sodium: 140 mEq/L (ref 135–145)

## 2020-04-20 NOTE — Assessment & Plan Note (Signed)
No relapse 

## 2020-04-20 NOTE — Progress Notes (Signed)
Subjective:  Patient ID: Diane Sawyer, female    DOB: 1946/07/31  Age: 74 y.o. MRN: [id number]  CC: No chief complaint on file.   HPI Diane Sawyer presents for anxiety, HTN, OA f/u No more syncope  Outpatient Medications Prior to Visit  Medication Sig Dispense Refill  . acetaminophen (TYLENOL) 500 MG tablet Take 1,000 mg by mouth every 6 (six) hours as needed for mild pain.    [name] ALPRAZolam (XANAX) 0.25 MG tablet Take 1 tablet (0.25 mg total) by mouth 2 (two) times daily as needed. for anxiety 60 tablet 3  . amLODipine (NORVASC) 5 MG tablet Take 1 tablet (5 mg total) by mouth daily. 90 tablet 3  . clobetasol cream (TEMOVATE) [id number] % Apply 1 application topically daily as needed (dermatosis). Reported on 05/23/2016    . dorzolamide (TRUSOPT) 2 % ophthalmic solution INT 1 GTT IN OD TID    . etodolac (LODINE) 500 MG tablet Take 500 mg by mouth as needed.     . fexofenadine (ALLEGRA) 180 MG tablet Take 180 mg by mouth daily.      . irbesartan (AVAPRO) 150 MG tablet Take 1 tablet (150 mg total) by mouth daily. 90 tablet 3  . IRON PO Take 65 mg by mouth daily.     [name] latanoprost (XALATAN) 0.005 % ophthalmic solution INT 1 GTT IN OU HS    . methocarbamol (ROBAXIN) 500 MG tablet Take 1 tablet (500 mg total) by mouth every 8 (eight) hours as needed for muscle spasms. 60 tablet 1  . pantoprazole (PROTONIX) 40 MG tablet Take 1 tablet (40 mg total) by mouth 2 (two) times daily. 180 tablet 3  . timolol (BETIMOL) 0.25 % ophthalmic solution Place 1 drop into the right eye daily.     . timolol (TIMOPTIC) 0.5 % ophthalmic solution INT 1 GTT IN OU BID    . VITAMIN D PO Take 1,000 Units by mouth daily.    . benzonatate (TESSALON) 200 MG capsule Take 1 capsule (200 mg total) by mouth 3 (three) times daily as needed for cough. (Patient not taking: Reported on 01/15/2020) 30 capsule 0  . sulfamethoxazole-trimethoprim (BACTRIM DS) 800-160 MG tablet Take 1 tablet by mouth 2 (two) times daily. 10 tablet 0   No  facility-administered medications prior to visit.    ROS: Review of Systems  Constitutional: Negative for activity change, appetite change, chills, fatigue and unexpected weight change.  HENT: Negative for congestion, mouth sores and sinus pressure.   Eyes: Negative for visual disturbance.  Respiratory: Negative for cough and chest tightness.   Gastrointestinal: Negative for abdominal pain and nausea.  Genitourinary: Negative for difficulty urinating, frequency and vaginal pain.  Musculoskeletal: Positive for arthralgias. Negative for back pain and gait problem.  Skin: Negative for pallor and rash.  Neurological: Negative for dizziness, tremors, weakness, numbness and headaches.  Psychiatric/Behavioral: Negative for confusion, sleep disturbance and suicidal ideas. The patient is nervous/anxious.     Objective:  BP 132/78 (BP Location: Left Arm, Patient Position: Sitting, Cuff Size: Large)   Pulse 73   Temp 98.6 F (37 C) (Oral)   Ht 5\' 3"  (1.6 m)   Wt 190 lb (86.2 kg)   SpO2 94%   BMI 33.66 kg/m   BP Readings from Last 3 Encounters:  04/20/20 132/78  01/15/20 130/76  01/13/20 130/82    Wt Readings from Last 3 Encounters:  04/20/20 190 lb (86.2 kg)  01/15/20 178 lb 12.8 oz (81.1 kg)  01/13/20  177 lb (80.3 kg)    Physical Exam Constitutional:      General: She is not in acute distress.    Appearance: She is well-developed. She is obese.  HENT:     Head: Normocephalic.     Right Ear: External ear normal.     Left Ear: External ear normal.     Nose: Nose normal.  Eyes:     General:        Right eye: No discharge.        Left eye: No discharge.     Conjunctiva/sclera: Conjunctivae normal.     Pupils: Pupils are equal, round, and reactive to light.  Neck:     Thyroid: No thyromegaly.     Vascular: No JVD.     Trachea: No tracheal deviation.  Cardiovascular:     Rate and Rhythm: Normal rate and regular rhythm.     Heart sounds: Normal heart sounds.  Pulmonary:       Effort: No respiratory distress.     Breath sounds: No stridor. No wheezing.  Abdominal:     General: Bowel sounds are normal. There is no distension.     Palpations: Abdomen is soft. There is no mass.     Tenderness: There is no abdominal tenderness. There is no guarding or rebound.  Musculoskeletal:        General: No tenderness.     Cervical back: Normal range of motion and neck supple.  Lymphadenopathy:     Cervical: No cervical adenopathy.  Skin:    Findings: No erythema or rash.  Neurological:     Mental Status: She is oriented to person, place, and time.     Cranial Nerves: No cranial nerve deficit.     Motor: No abnormal muscle tone.     Coordination: Coordination normal.     Deep Tendon Reflexes: Reflexes normal.  Psychiatric:        Behavior: Behavior normal.        Thought Content: Thought content normal.        Judgment: Judgment normal.     Lab Results  Component Value Date   WBC 6.4 10/21/2019   HGB 14.6 10/21/2019   HCT 43.8 10/21/2019   PLT 223.0 10/21/2019   GLUCOSE 101 (H) 10/21/2019   CHOL 192 10/21/2019   TRIG 79.0 10/21/2019   HDL 65.10 10/21/2019   LDLCALC 111 (H) 10/21/2019   ALT 9 10/21/2019   AST 13 10/21/2019   NA 140 10/21/2019   K 4.6 10/21/2019   CL 105 10/21/2019   CREATININE 0.68 10/21/2019   BUN 14 10/21/2019   CO2 26 10/21/2019   TSH 0.99 10/21/2019   HGBA1C 5.7 10/21/2019    MM 3D SCREEN BREAST BILATERAL  Result Date: 03/12/2020 CLINICAL DATA:  Screening. EXAM: DIGITAL SCREENING BILATERAL MAMMOGRAM WITH TOMO AND CAD COMPARISON:  Previous exam(s). ACR Breast Density Category b: There are scattered areas of fibroglandular density. FINDINGS: There are no findings suspicious for malignancy. Images were processed with CAD. IMPRESSION: No mammographic evidence of malignancy. A result letter of this screening mammogram will be mailed directly to the patient. RECOMMENDATION: Screening mammogram in one year. (Code:SM-B-01Y) BI-RADS  CATEGORY  1: Negative. Electronically Signed   By: Zerita Boers M.D.   On: 03/12/2020 10:41    Assessment & Plan:   There are no diagnoses linked to this encounter.   No orders of the defined types were placed in this encounter.    Follow-up: No follow-ups on  file.  Walker Kehr, MD

## 2020-04-20 NOTE — Patient Instructions (Signed)
Options to deal with swelling - decrease salt, reduce Norvasc, use compression socks, we can try diuretics as needed

## 2020-04-20 NOTE — Assessment & Plan Note (Addendum)
Mild on ankles Edema -- Options to deal w/it discussed - decrease salt, reduce Norvasc, compr socks, prn diuretics  Labs

## 2020-04-20 NOTE — Addendum Note (Signed)
Addended by: Trenda Moots on: [id number] 11:45 AM   Modules accepted: Orders

## 2020-04-20 NOTE — Assessment & Plan Note (Signed)
BP Readings from Last 3 Encounters:  04/20/20 132/78  01/15/20 130/76  01/13/20 130/82

## 2020-04-20 NOTE — Assessment & Plan Note (Signed)
Xanax prn  Potential benefits of a long term benzodiazepines  use as well as potential risks  and complications were explained to the patient and were aknowledged. 

## 2020-04-20 NOTE — Assessment & Plan Note (Signed)
Vit D 

## 2020-04-21 LAB — TSH: TSH: 1.06 u[IU]/mL (ref 0.35–4.50)

## 2020-04-24 ENCOUNTER — Other Ambulatory Visit: Payer: Self-pay | Admitting: Internal Medicine

## 2020-04-27 NOTE — Telephone Encounter (Signed)
 Controlled Database Checked Last filled: 02/28/20 # 60 LOV w/you: 04/20/20 Next appt w/you: 08/24/20

## 2020-05-18 DIAGNOSIS — H26492 Other secondary cataract, left eye: Secondary | ICD-10-CM | POA: Diagnosis not present

## 2020-05-18 DIAGNOSIS — H401122 Primary open-angle glaucoma, left eye, moderate stage: Secondary | ICD-10-CM | POA: Diagnosis not present

## 2020-05-18 DIAGNOSIS — H401111 Primary open-angle glaucoma, right eye, mild stage: Secondary | ICD-10-CM | POA: Diagnosis not present

## 2020-05-18 DIAGNOSIS — H2511 Age-related nuclear cataract, right eye: Secondary | ICD-10-CM | POA: Diagnosis not present

## 2020-05-18 DIAGNOSIS — Z961 Presence of intraocular lens: Secondary | ICD-10-CM | POA: Diagnosis not present

## 2020-06-15 ENCOUNTER — Ambulatory Visit: Payer: Medicare Other

## 2020-06-17 ENCOUNTER — Other Ambulatory Visit: Payer: Self-pay

## 2020-06-17 ENCOUNTER — Ambulatory Visit (INDEPENDENT_AMBULATORY_CARE_PROVIDER_SITE_OTHER): Payer: Medicare Other

## 2020-06-17 VITALS — BP 126/80 | HR 69 | Temp 98.2°F | Resp 16 | Ht 63.0 in | Wt 193.2 lb

## 2020-06-17 DIAGNOSIS — Z0000 Encounter for general adult medical examination without abnormal findings: Secondary | ICD-10-CM

## 2020-06-17 NOTE — Patient Instructions (Signed)
Diane Sawyer , Thank you for taking time to come for your Medicare Wellness Visit. I appreciate your ongoing commitment to your health goals. Please review the following plan we discussed and let me know if I can assist you in the future.   Screening recommendations/referrals: Colonoscopy: 04/22/2019; due every 3 years Mammogram: 03/12/2020; due every 1-2 years Bone Density: 07/31/2017; due every 2 years Recommended yearly ophthalmology/optometry visit for glaucoma screening and checkup Recommended yearly dental visit for hygiene and checkup  Vaccinations: Influenza vaccine: 10/21/2019 Pneumococcal vaccine: completed Tdap vaccine: 05/05/2010; due every 10 years Shingles vaccine: never done   Covid-19: completed  Advanced directives: Please bring a copy of your health care power of attorney and living will to the office at your convenience.  Conditions/risks identified: Yes; Please continue to do your personal lifestyle choices by: daily care of teeth and gums, regular physical activity (goal should be 5 days a week for 30 minutes), eat a healthy diet, avoid tobacco and drug use, limiting any alcohol intake, taking a low-dose aspirin (if not allergic or have been advised by your provider otherwise) and taking vitamins and minerals as recommended by your provider. Continue doing brain stimulating activities (puzzles, reading, adult coloring books, staying active) to keep memory sharp. Continue to eat heart healthy diet (full of fruits, vegetables, whole grains, lean protein, water--limit salt, fat, and sugar intake) and increase physical activity as tolerated.  Next appointment: Please schedule your next Medicare Wellness Visit with your Nurse Health Advisor in 1 year.   Preventive Care 80 Years and Older, Female Preventive care refers to lifestyle choices and visits with your health care provider that can promote health and wellness. What does preventive care include?  A yearly physical exam. This  is also called an annual well check.  Dental exams once or twice a year.  Routine eye exams. Ask your health care provider how often you should have your eyes checked.  Personal lifestyle choices, including:  Daily care of your teeth and gums.  Regular physical activity.  Eating a healthy diet.  Avoiding tobacco and drug use.  Limiting alcohol use.  Practicing safe sex.  Taking low-dose aspirin every day.  Taking vitamin and mineral supplements as recommended by your health care provider. What happens during an annual well check? The services and screenings done by your health care provider during your annual well check will depend on your age, overall health, lifestyle risk factors, and family history of disease. Counseling  Your health care provider may ask you questions about your:  Alcohol use.  Tobacco use.  Drug use.  Emotional well-being.  Home and relationship well-being.  Sexual activity.  Eating habits.  History of falls.  Memory and ability to understand (cognition).  Work and work Statistician.  Reproductive health. Screening  You may have the following tests or measurements:  Height, weight, and BMI.  Blood pressure.  Lipid and cholesterol levels. These may be checked every 5 years, or more frequently if you are over 70 years old.  Skin check.  Lung cancer screening. You may have this screening every year starting at age 39 if you have a 30-pack-year history of smoking and currently smoke or have quit within the past 15 years.  Fecal occult blood test (FOBT) of the stool. You may have this test every year starting at age 76.  Flexible sigmoidoscopy or colonoscopy. You may have a sigmoidoscopy every 5 years or a colonoscopy every 10 years starting at age 32.  Hepatitis C  blood test.  Hepatitis B blood test.  Sexually transmitted disease (STD) testing.  Diabetes screening. This is done by checking your blood sugar (glucose) after you  have not eaten for a while (fasting). You may have this done every 1-3 years.  Bone density scan. This is done to screen for osteoporosis. You may have this done starting at age 10.  Mammogram. This may be done every 1-2 years. Talk to your health care provider about how often you should have regular mammograms. Talk with your health care provider about your test results, treatment options, and if necessary, the need for more tests. Vaccines  Your health care provider may recommend certain vaccines, such as:  Influenza vaccine. This is recommended every year.  Tetanus, diphtheria, and acellular pertussis (Tdap, Td) vaccine. You may need a Td booster every 10 years.  Zoster vaccine. You may need this after age 6.  Pneumococcal 13-valent conjugate (PCV13) vaccine. One dose is recommended after age 67.  Pneumococcal polysaccharide (PPSV23) vaccine. One dose is recommended after age 32. Talk to your health care provider about which screenings and vaccines you need and how often you need them. This information is not intended to replace advice given to you by your health care provider. Make sure you discuss any questions you have with your health care provider. Document Released: 12/18/2015 Document Revised: 08/10/2016 Document Reviewed: 09/22/2015 Elsevier Interactive Patient Education  2017 [place] Prevention in the Home Falls can cause injuries. They can happen to people of all ages. There are many things you can do to make your home safe and to help prevent falls. What can I do on the outside of my home?  Regularly fix the edges of walkways and driveways and fix any cracks.  Remove anything that might make you trip as you walk through a door, such as a raised step or threshold.  Trim any bushes or trees on the path to your home.  Use bright outdoor lighting.  Clear any walking paths of anything that might make someone trip, such as rocks or tools.  Regularly check to  see if handrails are loose or broken. Make sure that both sides of any steps have handrails.  Any raised decks and porches should have guardrails on the edges.  Have any leaves, snow, or ice cleared regularly.  Use sand or salt on walking paths during winter.  Clean up any spills in your garage right away. This includes oil or grease spills. What can I do in the bathroom?  Use night lights.  Install grab bars by the toilet and in the tub and shower. Do not use towel bars as grab bars.  Use non-skid mats or decals in the tub or shower.  If you need to sit down in the shower, use a plastic, non-slip stool.  Keep the floor dry. Clean up any water that spills on the floor as soon as it happens.  Remove soap buildup in the tub or shower regularly.  Attach bath mats securely with double-sided non-slip rug tape.  Do not have throw rugs and other things on the floor that can make you trip. What can I do in the bedroom?  Use night lights.  Make sure that you have a light by your bed that is easy to reach.  Do not use any sheets or blankets that are too big for your bed. They should not hang down onto the floor.  Have a firm chair that has side arms. You  can use this for support while you get dressed.  Do not have throw rugs and other things on the floor that can make you trip. What can I do in the kitchen?  Clean up any spills right away.  Avoid walking on wet floors.  Keep items that you use a lot in easy-to-reach places.  If you need to reach something above you, use a strong step stool that has a grab bar.  Keep electrical cords out of the way.  Do not use floor polish or wax that makes floors slippery. If you must use wax, use non-skid floor wax.  Do not have throw rugs and other things on the floor that can make you trip. What can I do with my stairs?  Do not leave any items on the stairs.  Make sure that there are handrails on both sides of the stairs and use  them. Fix handrails that are broken or loose. Make sure that handrails are as long as the stairways.  Check any carpeting to make sure that it is firmly attached to the stairs. Fix any carpet that is loose or worn.  Avoid having throw rugs at the top or bottom of the stairs. If you do have throw rugs, attach them to the floor with carpet tape.  Make sure that you have a light switch at the top of the stairs and the bottom of the stairs. If you do not have them, ask someone to add them for you. What else can I do to help prevent falls?  Wear shoes that:  Do not have high heels.  Have rubber bottoms.  Are comfortable and fit you well.  Are closed at the toe. Do not wear sandals.  If you use a stepladder:  Make sure that it is fully opened. Do not climb a closed stepladder.  Make sure that both sides of the stepladder are locked into place.  Ask someone to hold it for you, if possible.  Clearly mark and make sure that you can see:  Any grab bars or handrails.  First and last steps.  Where the edge of each step is.  Use tools that help you move around (mobility aids) if they are needed. These include:  Canes.  Walkers.  Scooters.  Crutches.  Turn on the lights when you go into a dark area. Replace any light bulbs as soon as they burn out.  Set up your furniture so you have a clear path. Avoid moving your furniture around.  If any of your floors are uneven, fix them.  If there are any pets around you, be aware of where they are.  Review your medicines with your doctor. Some medicines can make you feel dizzy. This can increase your chance of falling. Ask your doctor what other things that you can do to help prevent falls. This information is not intended to replace advice given to you by your health care provider. Make sure you discuss any questions you have with your health care provider. Document Released: 09/17/2009 Document Revised: 04/28/2016 Document Reviewed:  12/26/2014 Elsevier Interactive Patient Education  2017 [employer].

## 2020-06-17 NOTE — Progress Notes (Addendum)
Subjective:   Diane Sawyer is a 74 y.o. female who presents for Medicare Annual (Subsequent) preventive examination.  Review of Systems    No ROS. Medicare Wellness Visit Cardiac Risk Factors include: advanced age (>42men, >62 women);family history of premature cardiovascular disease;dyslipidemia;hypertension;obesity (BMI >30kg/m2)     Objective:    Today's Vitals   06/17/20 1444  BP: 126/80  Pulse: 69  Resp: 16  Temp: 98.2 F (36.8 C)  SpO2: 97%  Weight: 193 lb 3.2 oz (87.6 kg)  Height: 5\' 3"  (1.6 m)  PainSc: 0-No pain   Body mass index is 34.22 kg/m.  Advanced Directives 06/17/2020 12/31/2018 10/01/2016  Does Patient Have a Medical Advance Directive? No No No  Does patient want to make changes to medical advance directive? - Yes (ED - Information included in AVS) -  Would patient like information on creating a medical advance directive? No - Patient declined - No - patient declined information    Current Medications (verified) Outpatient Encounter Medications as of 06/17/2020  Medication Sig   ALPRAZolam (XANAX) 0.25 MG tablet TAKE 1 TABLET(0.25 MG) BY MOUTH TWICE DAILY AS NEEDED FOR ANXIETY   acetaminophen (TYLENOL) 500 MG tablet Take 1,000 mg by mouth every 6 (six) hours as needed for mild pain.   amLODipine (NORVASC) 5 MG tablet Take 1 tablet (5 mg total) by mouth daily.   clobetasol cream (TEMOVATE) 1.32 % Apply 1 application topically daily as needed (dermatosis). Reported on 05/23/2016   dorzolamide (TRUSOPT) 2 % ophthalmic solution INT 1 GTT IN OD TID   etodolac (LODINE) 500 MG tablet Take 500 mg by mouth as needed.    fexofenadine (ALLEGRA) 180 MG tablet Take 180 mg by mouth daily.     irbesartan (AVAPRO) 150 MG tablet Take 1 tablet (150 mg total) by mouth daily.   IRON PO Take 65 mg by mouth daily.    latanoprost (XALATAN) 0.005 % ophthalmic solution INT 1 GTT IN OU HS   methocarbamol (ROBAXIN) 500 MG tablet Take 1 tablet (500 mg total) by mouth every 8  (eight) hours as needed for muscle spasms.   pantoprazole (PROTONIX) 40 MG tablet Take 1 tablet (40 mg total) by mouth 2 (two) times daily.   timolol (BETIMOL) 0.25 % ophthalmic solution Place 1 drop into the right eye daily.    timolol (TIMOPTIC) 0.5 % ophthalmic solution INT 1 GTT IN OU BID   VITAMIN D PO Take 1,000 Units by mouth daily.   No facility-administered encounter medications on file as of 06/17/2020.    Allergies (verified) Benazepril hcl, Levofloxacin, and Mobic [meloxicam]   History: Past Medical History:  Diagnosis Date   Adenomatous polyp of colon 2004   Allergic rhinitis    Anemia    Anxiety    Back pain    Cataract 2018   Diverticulosis    GERD (gastroesophageal reflux disease)    Glaucoma    Hypertension    Lumbar compression fracture ([place]) 09/07/2016   x 2    Menopause    Vitamin D deficiency    Past Surgical History:  Procedure Laterality Date   CATARACT EXTRACTION, BILATERAL  04/06/2017   with cypass stent implanted   COLONOSCOPY     cypess stent left eye Left 2018   POLYPECTOMY     Family History  Problem Relation Age of Onset   Stroke Mother 53   Hypertension Mother    Sudden death Mother    Anxiety disorder Mother  Lung cancer Father 98   Colon cancer Neg Hx    Esophageal cancer Neg Hx    Stomach cancer Neg Hx    Rectal cancer Neg Hx    Social History   Socioeconomic History   Marital status: Married    Spouse name: Mortimer Fries "Patrick [place]" [place]   Number of children: 2   Years of education: Not on file   Highest education level: Not on file  Occupational History   Occupation: Hair dresser  Tobacco Use   Smoking status: Never Smoker   Smokeless tobacco: Never Used  Vaping Use   Vaping Use: Never used  Substance and Sexual Activity   Alcohol use: No   Drug use: No   Sexual activity: Yes    Birth control/protection: None  Other Topics Concern   Not on file  Social History Narrative   Not on file   Social Determinants of Health    Financial Resource Strain: Low Risk    Difficulty of Paying Living Expenses: Not hard at all  Food Insecurity: No Food Insecurity   Worried About Charity fundraiser in the Last Year: Never true   [place] in the Last Year: Never true  Transportation Needs: No Transportation Needs   Lack of Transportation (Medical): No   Lack of Transportation (Non-Medical): No  Physical Activity: Sufficiently Active   Days of Exercise per Week: 7 days   Minutes of Exercise per Session: 60 min  Stress: No Stress Concern Present   Feeling of Stress : Not at all  Social Connections: Socially Integrated   Frequency of Communication with Friends and Family: More than three times a week   Frequency of Social Gatherings with Friends and Family: More than three times a week   Attends Religious Services: More than 4 times per year   Active Member of [employer] or Organizations: Yes   Attends Music therapist: More than 4 times per year   Marital Status: Married    Tobacco Counseling Counseling given: No   Clinical Intake:  Pre-visit preparation completed: Yes  Pain : No/denies pain Pain Score: 0-No pain     BMI - recorded: 34.22 Nutritional Status: BMI > 30  Obese Nutritional Risks: None Diabetes: No  How often do you need to have someone help you when you read instructions, pamphlets, or other written materials from your doctor or pharmacy?: 1 - Never What is the last grade level you completed in school?: 11th grade; [employer]  Diabetic? no  Interpreter Needed?: No  Information entered by :: Malin Sambrano N. Estelle Greenleaf, LPN   Activities of Daily Living In your present state of health, do you have any difficulty performing the following activities: 06/17/2020  Hearing? N  Vision? N  Difficulty concentrating or making decisions? N  Walking or climbing stairs? N  Dressing or bathing? N  Doing errands, shopping? N  Preparing Food and eating ? N  Using the Toilet? N   In the past six months, have you accidently leaked urine? N  Do you have problems with loss of bowel control? N  Managing your Medications? N  Managing your Finances? N  Housekeeping or managing your Housekeeping? N  Some recent data might be hidden    Patient Care Team: Plotnikov, Evie Lacks, MD as PCP - General Ladene Artist, MD as Attending Physician (Gastroenterology)  Indicate any recent Medical Services you may have received from other than [facility] providers in the past year (date may be  approximate).     Assessment:   This is a routine wellness examination for [place].  Hearing/Vision screen No exam data present  Dietary issues and exercise activities discussed: Current Exercise Habits: Home exercise routine;The patient has a physically strenuous job, but has no regular exercise apart from work., Type of exercise: walking (yard work & gardening), Time (Minutes): 60, Frequency (Times/Week): 7, Weekly Exercise (Minutes/Week): 420, Intensity: Moderate, Exercise limited by: None identified  Goals       patient      Maintain my current health status, enjoy life and family.      Patient Stated (pt-stated)      I would like to lose 12 more pounds and continue to stay active.       Depression Screen PHQ 2/9 Scores 06/17/2020 04/20/2020 12/31/2018 12/25/2017 12/11/2017 11/21/2016 11/16/2015  PHQ - 2 Score 0 0 0 1 0 0 0  PHQ- 9 Score - - - 5 - - -    Fall Risk Fall Risk  06/17/2020 04/20/2020 12/31/2018 12/11/2017 11/21/2016  Falls in the past year? 0 1 0 No Yes  Number falls in past yr: 0 0 0 - 1  Injury with Fall? 0 0 - - Yes  Risk for fall due to : No Fall Risks - - - -  Follow up Falls evaluation completed;Education provided Falls evaluation completed - - -    Any stairs in or around the home? No  If so, are there any without handrails? No  Home free of loose throw rugs in walkways, pet beds, electrical cords, etc? Yes  Adequate lighting in your home to reduce risk of falls?  Yes   ASSISTIVE DEVICES UTILIZED TO PREVENT FALLS:  Life alert? No  Use of a cane, walker or w/c? No  Grab bars in the bathroom? Yes  Shower chair or bench in shower? No  Elevated toilet seat or a handicapped toilet? No   TIMED UP AND GO:  Was the test performed? No .  Length of time to ambulate 10 feet: 0 sec.   Gait steady and fast without use of assistive device  Cognitive Function: not indicated; patient is cogitatively intact.        Immunizations Immunization History  Administered Date(s) Administered   Fluad Quad(high Dose 65+) 10/21/2019   Influenza Whole 09/01/2008, 08/31/2009, 09/27/2010   Influenza, High Dose Seasonal PF 09/18/2013, 11/21/2016, 10/17/2017, 12/31/2018   Influenza, Seasonal, Injecte, Preservative Fre 11/12/2012   Influenza,inj,Quad PF,6+ Mos 11/10/2014, 11/16/2015   PFIZER SARS-COV-2 Vaccination 03/16/2020, 04/08/2020   Pneumococcal Conjugate-13 11/10/2014   Pneumococcal Polysaccharide-23 11/12/2012   Td 05/05/2010   Zoster 12/21/2009    TDAP status: Due, Education has been provided regarding the importance of this vaccine. Advised may receive this vaccine at local pharmacy or Health Dept. Aware to provide a copy of the vaccination record if obtained from local pharmacy or Health Dept. Verbalized acceptance and understanding. Flu Vaccine status: Up to date Pneumococcal vaccine status: Up to date Covid-19 vaccine status: Completed vaccines  Qualifies for Shingles Vaccine? Yes   Zostavax completed Yes   Shingrix Completed?: No.    Education has been provided regarding the importance of this vaccine. Patient has been advised to call insurance company to determine out of pocket expense if they have not yet received this vaccine. Advised may also receive vaccine at local pharmacy or Health Dept. Verbalized acceptance and understanding.  Screening Tests Health Maintenance  Topic Date Due   Hepatitis C Screening  Never done  TETANUS/TDAP   05/05/2020   INFLUENZA VACCINE  07/05/2020   MAMMOGRAM  03/12/2022   COLONOSCOPY  04/21/2022   DEXA SCAN  Completed   COVID-19 Vaccine  Completed   PNA vac Low Risk Adult  Completed    Health Maintenance  Health Maintenance Due  Topic Date Due   Hepatitis C Screening  Never done   TETANUS/TDAP  05/05/2020    Colorectal cancer screening: Completed 04/22/2019. Repeat every 3 years Mammogram status: Completed 03/12/2020. Repeat every year Bone Density status: Completed 07/31/2017. Results reflect: Bone density results: OSTEOPOROSIS. Repeat every 2-3 years.  Lung Cancer Screening: (Low Dose CT Chest recommended if Age 36-80 years, 30 pack-year currently smoking OR have quit w/in 15years.) does not qualify.   Lung Cancer Screening Referral: no  Additional Screening:  Hepatitis C Screening: does qualify; Completed no  Vision Screening: Recommended annual ophthalmology exams for early detection of glaucoma and other disorders of the eye. Is the patient up to date with their annual eye exam?  Yes  Who is the provider or what is the name of the office in which the patient attends annual eye exams? Midge Aver, MD If pt is not established with a provider, would they like to be referred to a provider to establish care? No .   Dental Screening: Recommended annual dental exams for proper oral hygiene  Community Resource Referral / Chronic Care Management: CRR required this visit?  No   CCM required this visit?  No      Plan:     I have personally reviewed and noted the following in the patient's chart:   Medical and social history Use of alcohol, tobacco or illicit drugs  Current medications and supplements Functional ability and status Nutritional status Physical activity Advanced directives List of other physicians Hospitalizations, surgeries, and ER visits in previous 12 months Vitals Screenings to include cognitive, depression, and falls Referrals and appointments  In  addition, I have reviewed and discussed with patient certain preventive protocols, quality metrics, and best practice recommendations. A written personalized care plan for preventive services as well as general preventive health recommendations were provided to patient.     Sheral Flow, LPN   [id number]   Nurse Notes:  Patient is cogitatively intact. Patient stated that she has no issues with gait or balance; does not use any assistive devices. Patient requested to get tetanus and flu vaccine during September visit wit PCP.  Medical screening examination/treatment/procedure(s) were performed by non-physician practitioner and as supervising physician I was immediately available for consultation/collaboration.  I agree with above. Lew Dawes, MD

## 2020-07-06 ENCOUNTER — Other Ambulatory Visit: Payer: Self-pay | Admitting: Internal Medicine

## 2020-07-06 DIAGNOSIS — I10 Essential (primary) hypertension: Secondary | ICD-10-CM

## 2020-07-20 ENCOUNTER — Other Ambulatory Visit: Payer: Self-pay | Admitting: Internal Medicine

## 2020-07-20 DIAGNOSIS — K219 Gastro-esophageal reflux disease without esophagitis: Secondary | ICD-10-CM

## 2020-08-19 ENCOUNTER — Encounter: Payer: Self-pay | Admitting: Internal Medicine

## 2020-08-19 ENCOUNTER — Ambulatory Visit (INDEPENDENT_AMBULATORY_CARE_PROVIDER_SITE_OTHER): Payer: Medicare Other | Admitting: Internal Medicine

## 2020-08-19 ENCOUNTER — Other Ambulatory Visit: Payer: Self-pay

## 2020-08-19 VITALS — BP 152/84 | HR 83 | Temp 98.6°F | Ht 63.0 in | Wt 193.0 lb

## 2020-08-19 DIAGNOSIS — Z23 Encounter for immunization: Secondary | ICD-10-CM

## 2020-08-19 DIAGNOSIS — I10 Essential (primary) hypertension: Secondary | ICD-10-CM | POA: Diagnosis not present

## 2020-08-19 DIAGNOSIS — M545 Low back pain, unspecified: Secondary | ICD-10-CM

## 2020-08-19 DIAGNOSIS — E559 Vitamin D deficiency, unspecified: Secondary | ICD-10-CM

## 2020-08-19 DIAGNOSIS — F419 Anxiety disorder, unspecified: Secondary | ICD-10-CM

## 2020-08-19 MED ORDER — ALPRAZOLAM 0.25 MG PO TABS: 0.2500 mg | ORAL_TABLET | Freq: Two times a day (BID) | ORAL | 5 refills | Status: DC | PRN

## 2020-08-19 MED ORDER — METHOCARBAMOL 500 MG PO TABS: 500.0000 mg | ORAL_TABLET | Freq: Three times a day (TID) | ORAL | 1 refills | Status: DC | PRN

## 2020-08-19 NOTE — Progress Notes (Signed)
Subjective:  Patient ID: Diane Sawyer, female    DOB: 1946/08/17  Age: 74 y.o. MRN: [id number]  CC: No chief complaint on file.   HPI Diane Sawyer presents for HTN, GERD, anxiety f/u C/o occ LBP BP ok at home Outpatient Medications Prior to Visit  Medication Sig Dispense Refill  . acetaminophen (TYLENOL) 500 MG tablet Take 1,000 mg by mouth every 6 (six) hours as needed for mild pain.    [name] ALPRAZolam (XANAX) 0.25 MG tablet TAKE 1 TABLET(0.25 MG) BY MOUTH TWICE DAILY AS NEEDED FOR ANXIETY 60 tablet 5  . amLODipine (NORVASC) 5 MG tablet TAKE 1 TABLET(5 MG) BY MOUTH DAILY 90 tablet 3  . clobetasol cream (TEMOVATE) 0.25 % Apply 1 application topically daily as needed (dermatosis). Reported on 05/23/2016    . dorzolamide (TRUSOPT) 2 % ophthalmic solution INT 1 GTT IN OD TID    . etodolac (LODINE) 500 MG tablet Take 500 mg by mouth as needed.     . fexofenadine (ALLEGRA) 180 MG tablet Take 180 mg by mouth daily.      . irbesartan (AVAPRO) 150 MG tablet Take 1 tablet (150 mg total) by mouth daily. 90 tablet 3  . IRON PO Take 65 mg by mouth daily.     [name] latanoprost (XALATAN) 0.005 % ophthalmic solution INT 1 GTT IN OU HS    . methocarbamol (ROBAXIN) 500 MG tablet Take 1 tablet (500 mg total) by mouth every 8 (eight) hours as needed for muscle spasms. 60 tablet 1  . pantoprazole (PROTONIX) 40 MG tablet TAKE 1 TABLET(40 MG) BY MOUTH TWICE DAILY 180 tablet 3  . timolol (BETIMOL) 0.25 % ophthalmic solution Place 1 drop into the right eye daily.     . timolol (TIMOPTIC) 0.5 % ophthalmic solution INT 1 GTT IN OU BID    . VITAMIN D PO Take 1,000 Units by mouth daily.     No facility-administered medications prior to visit.    ROS: Review of Systems  Constitutional: Negative for activity change, appetite change, chills, fatigue and unexpected weight change.  HENT: Negative for congestion, mouth sores and sinus pressure.   Eyes: Negative for visual disturbance.  Respiratory: Negative for cough and  chest tightness.   Gastrointestinal: Negative for abdominal pain and nausea.  Genitourinary: Negative for difficulty urinating, frequency and vaginal pain.  Musculoskeletal: Negative for back pain and gait problem.  Skin: Negative for pallor and rash.  Neurological: Negative for dizziness, tremors, weakness, numbness and headaches.  Psychiatric/Behavioral: Negative for confusion and sleep disturbance.    Objective:  BP (!) 152/84 (BP Location: Right Arm, Patient Position: Sitting, Cuff Size: Large)   Pulse 83   Temp 98.6 F (37 C) (Oral)   Ht 5\' 3"  (1.6 m)   Wt 193 lb (87.5 kg)   SpO2 98%   BMI 34.19 kg/m   BP Readings from Last 3 Encounters:  08/19/20 (!) 152/84  06/17/20 126/80  04/20/20 132/78    Wt Readings from Last 3 Encounters:  08/19/20 193 lb (87.5 kg)  06/17/20 193 lb 3.2 oz (87.6 kg)  04/20/20 190 lb (86.2 kg)    Physical Exam Constitutional:      General: She is not in acute distress.    Appearance: She is well-developed. She is obese.  HENT:     Head: Normocephalic.     Right Ear: External ear normal.     Left Ear: External ear normal.     Nose: Nose normal.  Eyes:  General:        Right eye: No discharge.        Left eye: No discharge.     Conjunctiva/sclera: Conjunctivae normal.     Pupils: Pupils are equal, round, and reactive to light.  Neck:     Thyroid: No thyromegaly.     Vascular: No JVD.     Trachea: No tracheal deviation.  Cardiovascular:     Rate and Rhythm: Normal rate and regular rhythm.     Heart sounds: Normal heart sounds.  Pulmonary:     Effort: No respiratory distress.     Breath sounds: No stridor. No wheezing.  Abdominal:     General: Bowel sounds are normal. There is no distension.     Palpations: Abdomen is soft. There is no mass.     Tenderness: There is no abdominal tenderness. There is no guarding or rebound.  Musculoskeletal:        General: No tenderness.     Cervical back: Normal range of motion and neck  supple.  Lymphadenopathy:     Cervical: No cervical adenopathy.  Skin:    Findings: No erythema or rash.  Neurological:     Cranial Nerves: No cranial nerve deficit.     Motor: No abnormal muscle tone.     Coordination: Coordination normal.     Deep Tendon Reflexes: Reflexes normal.  Psychiatric:        Behavior: Behavior normal.        Thought Content: Thought content normal.        Judgment: Judgment normal.     Lab Results  Component Value Date   WBC 6.4 10/21/2019   HGB 14.6 10/21/2019   HCT 43.8 10/21/2019   PLT 223.0 10/21/2019   GLUCOSE 81 04/20/2020   CHOL 192 10/21/2019   TRIG 79.0 10/21/2019   HDL 65.10 10/21/2019   LDLCALC 111 (H) 10/21/2019   ALT 9 10/21/2019   AST 13 10/21/2019   NA 140 04/20/2020   K 4.2 04/20/2020   CL 105 04/20/2020   CREATININE 0.74 04/20/2020   BUN 16 04/20/2020   CO2 28 04/20/2020   TSH 1.06 04/20/2020   HGBA1C 5.7 10/21/2019    MM 3D SCREEN BREAST BILATERAL  Result Date: 03/12/2020 CLINICAL DATA:  Screening. EXAM: DIGITAL SCREENING BILATERAL MAMMOGRAM WITH TOMO AND CAD COMPARISON:  Previous exam(s). ACR Breast Density Category b: There are scattered areas of fibroglandular density. FINDINGS: There are no findings suspicious for malignancy. Images were processed with CAD. IMPRESSION: No mammographic evidence of malignancy. A result letter of this screening mammogram will be mailed directly to the patient. RECOMMENDATION: Screening mammogram in one year. (Code:SM-B-01Y) BI-RADS CATEGORY  1: Negative. Electronically Signed   By: Zerita Boers M.D.   On: 03/12/2020 10:41    Assessment & Plan:    Walker Kehr, MD

## 2020-08-19 NOTE — Assessment & Plan Note (Signed)
Vit D 

## 2020-08-19 NOTE — Patient Instructions (Signed)
[place] started vaccine booster sign up. Please call [place] [facility] Line at [phone].

## 2020-08-19 NOTE — Addendum Note (Signed)
Addended by: Darlys Gales on: 08/19/2020 08:58 AM   Modules accepted: Orders

## 2020-08-19 NOTE — Assessment & Plan Note (Signed)
Xanax prn  Potential benefits of a long term benzodiazepines  use as well as potential risks  and complications were explained to the patient and were aknowledged. 

## 2020-08-19 NOTE — Assessment & Plan Note (Signed)
Amlodipine, Irbesartan BP ok at home

## 2020-08-19 NOTE — Assessment & Plan Note (Signed)
Robaxin prn

## 2020-08-24 ENCOUNTER — Ambulatory Visit: Payer: Medicare Other | Admitting: Internal Medicine

## 2020-09-24 ENCOUNTER — Ambulatory Visit (INDEPENDENT_AMBULATORY_CARE_PROVIDER_SITE_OTHER): Payer: Medicare Other

## 2020-09-24 ENCOUNTER — Other Ambulatory Visit: Payer: Self-pay

## 2020-09-24 DIAGNOSIS — M810 Age-related osteoporosis without current pathological fracture: Secondary | ICD-10-CM | POA: Diagnosis not present

## 2020-09-24 MED ORDER — DENOSUMAB 60 MG/ML ~~LOC~~ SOSY
60.0000 mg | PREFILLED_SYRINGE | Freq: Once | SUBCUTANEOUS | Status: AC
  Administered 2020-09-24: 60 mg via SUBCUTANEOUS

## 2020-09-24 NOTE — Progress Notes (Addendum)
Prolia 60mg  given subcutaneously in left upper arm per Dr Alain Marion.  Pt tolerated well.  Medical screening examination/treatment/procedure(s) were performed by non-physician practitioner and as supervising physician I was immediately available for consultation/collaboration.  I agree with above. Lew Dawes, MD

## 2020-09-28 DIAGNOSIS — H26492 Other secondary cataract, left eye: Secondary | ICD-10-CM | POA: Diagnosis not present

## 2020-09-28 DIAGNOSIS — H401122 Primary open-angle glaucoma, left eye, moderate stage: Secondary | ICD-10-CM | POA: Diagnosis not present

## 2020-09-28 DIAGNOSIS — H2511 Age-related nuclear cataract, right eye: Secondary | ICD-10-CM | POA: Diagnosis not present

## 2020-09-28 DIAGNOSIS — Z961 Presence of intraocular lens: Secondary | ICD-10-CM | POA: Diagnosis not present

## 2020-09-28 DIAGNOSIS — H401111 Primary open-angle glaucoma, right eye, mild stage: Secondary | ICD-10-CM | POA: Diagnosis not present

## 2020-10-17 DIAGNOSIS — Z23 Encounter for immunization: Secondary | ICD-10-CM | POA: Diagnosis not present

## 2020-11-15 ENCOUNTER — Other Ambulatory Visit: Payer: Self-pay | Admitting: Internal Medicine

## 2021-01-25 DIAGNOSIS — Z961 Presence of intraocular lens: Secondary | ICD-10-CM | POA: Diagnosis not present

## 2021-01-25 DIAGNOSIS — H26492 Other secondary cataract, left eye: Secondary | ICD-10-CM | POA: Diagnosis not present

## 2021-01-25 DIAGNOSIS — H401111 Primary open-angle glaucoma, right eye, mild stage: Secondary | ICD-10-CM | POA: Diagnosis not present

## 2021-01-25 DIAGNOSIS — H2511 Age-related nuclear cataract, right eye: Secondary | ICD-10-CM | POA: Diagnosis not present

## 2021-01-25 DIAGNOSIS — H401122 Primary open-angle glaucoma, left eye, moderate stage: Secondary | ICD-10-CM | POA: Diagnosis not present

## 2021-02-15 ENCOUNTER — Ambulatory Visit: Payer: Medicare Other | Admitting: Internal Medicine

## 2021-02-22 ENCOUNTER — Encounter: Payer: Self-pay | Admitting: Internal Medicine

## 2021-02-22 ENCOUNTER — Other Ambulatory Visit: Payer: Self-pay

## 2021-02-22 ENCOUNTER — Ambulatory Visit (INDEPENDENT_AMBULATORY_CARE_PROVIDER_SITE_OTHER): Payer: Medicare Other | Admitting: Internal Medicine

## 2021-02-22 VITALS — BP 138/82 | HR 68 | Temp 98.3°F | Ht 63.0 in | Wt 202.6 lb

## 2021-02-22 DIAGNOSIS — I10 Essential (primary) hypertension: Secondary | ICD-10-CM

## 2021-02-22 DIAGNOSIS — E559 Vitamin D deficiency, unspecified: Secondary | ICD-10-CM

## 2021-02-22 DIAGNOSIS — R7303 Prediabetes: Secondary | ICD-10-CM | POA: Diagnosis not present

## 2021-02-22 DIAGNOSIS — R739 Hyperglycemia, unspecified: Secondary | ICD-10-CM | POA: Diagnosis not present

## 2021-02-22 DIAGNOSIS — E785 Hyperlipidemia, unspecified: Secondary | ICD-10-CM

## 2021-02-22 DIAGNOSIS — K219 Gastro-esophageal reflux disease without esophagitis: Secondary | ICD-10-CM | POA: Diagnosis not present

## 2021-02-22 DIAGNOSIS — F419 Anxiety disorder, unspecified: Secondary | ICD-10-CM

## 2021-02-22 NOTE — Assessment & Plan Note (Signed)
Hold Flonase x 2-3 weeks

## 2021-02-22 NOTE — Assessment & Plan Note (Signed)
On Vit D 

## 2021-02-22 NOTE — Assessment & Plan Note (Signed)
Xanax prn  Potential benefits of a long term benzodiazepines  use as well as potential risks  and complications were explained to the patient and were aknowledged. 

## 2021-02-22 NOTE — Assessment & Plan Note (Signed)
On Protonix bid

## 2021-02-22 NOTE — Assessment & Plan Note (Addendum)
Loose wt Check A1c 

## 2021-02-22 NOTE — Assessment & Plan Note (Signed)
BP ok at home 

## 2021-02-22 NOTE — Assessment & Plan Note (Signed)
Wt loss CMET ordered

## 2021-02-22 NOTE — Patient Instructions (Signed)
Hold Flonase x 2-3 weeks

## 2021-02-22 NOTE — Progress Notes (Signed)
Subjective:  Patient ID: Diane Sawyer, female    DOB: [birth year]  Age: 75 y.o. MRN: [id number]  CC: Follow-up (6 MONTH F/U)   HPI Dorthie Santini Dowse presents for HTN, GERD, anxiety  Outpatient Medications Prior to Visit  Medication Sig Dispense Refill  . acetaminophen (TYLENOL) 500 MG tablet Take 1,000 mg by mouth every 6 (six) hours as needed for mild pain.    [name] ALPRAZolam (XANAX) 0.25 MG tablet Take 1 tablet (0.25 mg total) by mouth 2 (two) times daily as needed for anxiety. 60 tablet 5  . amLODipine (NORVASC) 5 MG tablet TAKE 1 TABLET(5 MG) BY MOUTH DAILY 90 tablet 3  . clobetasol cream (TEMOVATE) 1.76 % Apply 1 application topically daily as needed (dermatosis). Reported on 05/23/2016    . dorzolamide (TRUSOPT) 2 % ophthalmic solution INT 1 GTT IN OD TID    . dorzolamide-timolol (COSOPT) 22.3-6.8 MG/ML ophthalmic solution 1 drop 2 (two) times daily.    [name] etodolac (LODINE) 500 MG tablet Take 500 mg by mouth as needed.     . fexofenadine (ALLEGRA) 180 MG tablet Take 180 mg by mouth daily.    . irbesartan (AVAPRO) 150 MG tablet TAKE 1 TABLET(150 MG) BY MOUTH DAILY 90 tablet 3  . IRON PO Take 65 mg by mouth daily.    [name] latanoprost (XALATAN) 0.005 % ophthalmic solution INT 1 GTT IN OU HS    . methocarbamol (ROBAXIN) 500 MG tablet Take 1 tablet (500 mg total) by mouth every 8 (eight) hours as needed for muscle spasms. 60 tablet 1  . pantoprazole (PROTONIX) 40 MG tablet TAKE 1 TABLET(40 MG) BY MOUTH TWICE DAILY 180 tablet 3  . timolol (BETIMOL) 0.25 % ophthalmic solution Place 1 drop into the right eye daily.     . timolol (TIMOPTIC) 0.5 % ophthalmic solution INT 1 GTT IN OU BID    . VITAMIN D PO Take 1,000 Units by mouth daily.     No facility-administered medications prior to visit.    ROS: Review of Systems  Constitutional: Positive for unexpected weight change. Negative for activity change, appetite change, chills and fatigue.  HENT: Negative for congestion, mouth sores and sinus  pressure.   Eyes: Negative for visual disturbance.  Respiratory: Negative for cough and chest tightness.   Gastrointestinal: Negative for abdominal pain and nausea.  Genitourinary: Negative for difficulty urinating, frequency and vaginal pain.  Musculoskeletal: Positive for arthralgias and back pain. Negative for gait problem.  Skin: Negative for pallor and rash.  Neurological: Negative for dizziness, tremors, weakness, numbness and headaches.  Psychiatric/Behavioral: Negative for confusion and sleep disturbance.    Objective:  BP 138/82 (BP Location: Left Arm)   Pulse 68   Temp 98.3 F (36.8 C) (Oral)   Ht 5\' 3"  (1.6 m)   Wt 202 lb 10.1 oz (91.9 kg)   SpO2 97%   BMI 35.89 kg/m   BP Readings from Last 3 Encounters:  02/22/21 138/82  08/19/20 (!) 152/84  06/17/20 126/80    Wt Readings from Last 3 Encounters:  02/22/21 202 lb 10.1 oz (91.9 kg)  08/19/20 193 lb (87.5 kg)  06/17/20 193 lb 3.2 oz (87.6 kg)    Physical Exam Constitutional:      General: She is not in acute distress.    Appearance: She is well-developed. She is obese.  HENT:     Head: Normocephalic.     Right Ear: External ear normal.     Left Ear: External ear normal.  Nose: Nose normal.  Eyes:     General:        Right eye: No discharge.        Left eye: No discharge.     Conjunctiva/sclera: Conjunctivae normal.     Pupils: Pupils are equal, round, and reactive to light.  Neck:     Thyroid: No thyromegaly.     Vascular: No JVD.     Trachea: No tracheal deviation.  Cardiovascular:     Rate and Rhythm: Normal rate and regular rhythm.     Heart sounds: Normal heart sounds.  Pulmonary:     Effort: No respiratory distress.     Breath sounds: No stridor. No wheezing.  Abdominal:     General: Bowel sounds are normal. There is no distension.     Palpations: Abdomen is soft. There is no mass.     Tenderness: There is no abdominal tenderness. There is no guarding or rebound.  Musculoskeletal:         General: No tenderness.     Cervical back: Normal range of motion and neck supple.  Lymphadenopathy:     Cervical: No cervical adenopathy.  Skin:    Findings: No erythema or rash.  Neurological:     Cranial Nerves: No cranial nerve deficit.     Motor: No abnormal muscle tone.     Coordination: Coordination normal.     Deep Tendon Reflexes: Reflexes normal.  Psychiatric:        Behavior: Behavior normal.        Thought Content: Thought content normal.        Judgment: Judgment normal.       Lab Results  Component Value Date   WBC 6.4 10/21/2019   HGB 14.6 10/21/2019   HCT 43.8 10/21/2019   PLT 223.0 10/21/2019   GLUCOSE 81 04/20/2020   CHOL 192 10/21/2019   TRIG 79.0 10/21/2019   HDL 65.10 10/21/2019   LDLCALC 111 (H) 10/21/2019   ALT 9 10/21/2019   AST 13 10/21/2019   NA 140 04/20/2020   K 4.2 04/20/2020   CL 105 04/20/2020   CREATININE 0.74 04/20/2020   BUN 16 04/20/2020   CO2 28 04/20/2020   TSH 1.06 04/20/2020   HGBA1C 5.7 10/21/2019    MM 3D SCREEN BREAST BILATERAL  Result Date: 03/12/2020 CLINICAL DATA:  Screening. EXAM: DIGITAL SCREENING BILATERAL MAMMOGRAM WITH TOMO AND CAD COMPARISON:  Previous exam(s). ACR Breast Density Category b: There are scattered areas of fibroglandular density. FINDINGS: There are no findings suspicious for malignancy. Images were processed with CAD. IMPRESSION: No mammographic evidence of malignancy. A result letter of this screening mammogram will be mailed directly to the patient. RECOMMENDATION: Screening mammogram in one year. (Code:SM-B-01Y) BI-RADS CATEGORY  1: Negative. Electronically Signed   By: Zerita Boers M.D.   On: 03/12/2020 10:41    Assessment & Plan:    Walker Kehr, MD

## 2021-02-23 ENCOUNTER — Encounter: Payer: Self-pay | Admitting: Internal Medicine

## 2021-02-28 ENCOUNTER — Other Ambulatory Visit: Payer: Self-pay | Admitting: Internal Medicine

## 2021-04-06 DIAGNOSIS — T1512XA Foreign body in conjunctival sac, left eye, initial encounter: Secondary | ICD-10-CM | POA: Diagnosis not present

## 2021-04-14 ENCOUNTER — Ambulatory Visit (INDEPENDENT_AMBULATORY_CARE_PROVIDER_SITE_OTHER): Payer: Medicare Other

## 2021-04-14 ENCOUNTER — Other Ambulatory Visit: Payer: Self-pay

## 2021-04-14 DIAGNOSIS — M810 Age-related osteoporosis without current pathological fracture: Secondary | ICD-10-CM

## 2021-04-14 MED ORDER — DENOSUMAB 60 MG/ML ~~LOC~~ SOSY
60.0000 mg | PREFILLED_SYRINGE | Freq: Once | SUBCUTANEOUS | Status: AC
Start: 2021-04-14 — End: 2021-04-14
  Administered 2021-04-14: 60 mg via SUBCUTANEOUS

## 2021-04-14 NOTE — Progress Notes (Addendum)
Patient came into the office to receive her Prolia injection. She tolerated the injection well. No question or concerns. She has scheduled her next prolia shot 6 months and 1 day from now.   Medical screening examination/treatment/procedure(s) were performed by non-physician practitioner and as supervising physician I was immediately available for consultation/collaboration. I agree with above. Lew Dawes, MD

## 2021-05-17 DIAGNOSIS — H2511 Age-related nuclear cataract, right eye: Secondary | ICD-10-CM | POA: Diagnosis not present

## 2021-05-17 DIAGNOSIS — H401122 Primary open-angle glaucoma, left eye, moderate stage: Secondary | ICD-10-CM | POA: Diagnosis not present

## 2021-05-17 DIAGNOSIS — H401111 Primary open-angle glaucoma, right eye, mild stage: Secondary | ICD-10-CM | POA: Diagnosis not present

## 2021-05-17 DIAGNOSIS — H26492 Other secondary cataract, left eye: Secondary | ICD-10-CM | POA: Diagnosis not present

## 2021-05-17 DIAGNOSIS — Z961 Presence of intraocular lens: Secondary | ICD-10-CM | POA: Diagnosis not present

## 2021-06-17 ENCOUNTER — Other Ambulatory Visit: Payer: Self-pay

## 2021-06-17 ENCOUNTER — Ambulatory Visit (INDEPENDENT_AMBULATORY_CARE_PROVIDER_SITE_OTHER): Payer: Medicare Other

## 2021-06-17 VITALS — BP 122/70 | HR 76 | Temp 98.2°F | Ht 63.0 in | Wt 205.2 lb

## 2021-06-17 DIAGNOSIS — Z0000 Encounter for general adult medical examination without abnormal findings: Secondary | ICD-10-CM | POA: Diagnosis not present

## 2021-06-17 NOTE — Progress Notes (Addendum)
Subjective:   Diane Sawyer is a 75 y.o. female who presents for Medicare Annual (Subsequent) preventive examination.  Review of Systems     Cardiac Risk Factors include: advanced age (>77men, >32 women);hypertension;family history of premature cardiovascular disease;obesity (BMI >30kg/m2)     Objective:    Today's Vitals   06/17/21 1535 06/17/21 1539  BP:  122/70  Pulse:  76  Temp:  98.2 F (36.8 C)  SpO2:  99%  Weight:  205 lb 3.2 oz (93.1 kg)  Height:  5\' 3"  (1.6 m)  PainSc: 0-No pain 0-No pain   Body mass index is 36.35 kg/m.  Advanced Directives 06/17/2021 06/17/2020 12/31/2018 10/01/2016  Does Patient Have a Medical Advance Directive? No No No No  Does patient want to make changes to medical advance directive? - - Yes (ED - Information included in AVS) -  Would patient like information on creating a medical advance directive? No - Patient declined No - Patient declined - No - patient declined information    Current Medications (verified) Outpatient Encounter Medications as of 06/17/2021  Medication Sig   acetaminophen (TYLENOL) 500 MG tablet Take 1,000 mg by mouth every 6 (six) hours as needed for mild pain.   ALPRAZolam (XANAX) 0.25 MG tablet TAKE 1 TABLET(0.25 MG) BY MOUTH TWICE DAILY AS NEEDED FOR ANXIETY   amLODipine (NORVASC) 5 MG tablet TAKE 1 TABLET(5 MG) BY MOUTH DAILY   clobetasol cream (TEMOVATE) 9.56 % Apply 1 application topically daily as needed (dermatosis). Reported on 05/23/2016   dorzolamide (TRUSOPT) 2 % ophthalmic solution INT 1 GTT IN OD TID   dorzolamide-timolol (COSOPT) 22.3-6.8 MG/ML ophthalmic solution 1 drop 2 (two) times daily.   etodolac (LODINE) 500 MG tablet Take 500 mg by mouth as needed.    fexofenadine (ALLEGRA) 180 MG tablet Take 180 mg by mouth daily.   irbesartan (AVAPRO) 150 MG tablet TAKE 1 TABLET(150 MG) BY MOUTH DAILY   IRON PO Take 65 mg by mouth daily.   latanoprost (XALATAN) 0.005 % ophthalmic solution INT 1 GTT IN OU HS    methocarbamol (ROBAXIN) 500 MG tablet Take 1 tablet (500 mg total) by mouth every 8 (eight) hours as needed for muscle spasms.   pantoprazole (PROTONIX) 40 MG tablet TAKE 1 TABLET(40 MG) BY MOUTH TWICE DAILY   timolol (BETIMOL) 0.25 % ophthalmic solution Place 1 drop into the right eye daily.    timolol (TIMOPTIC) 0.5 % ophthalmic solution INT 1 GTT IN OU BID   VITAMIN D PO Take 1,000 Units by mouth daily.   No facility-administered encounter medications on file as of 06/17/2021.    Allergies (verified) Benazepril hcl, Levofloxacin, and Mobic [meloxicam]   History: Past Medical History:  Diagnosis Date   Adenomatous polyp of colon 2004   Allergic rhinitis    Anemia    Anxiety    Back pain    Cataract 2018   Diverticulosis    GERD (gastroesophageal reflux disease)    Glaucoma    Hypertension    Lumbar compression fracture ([place]) 09/07/2016   x 2    Menopause    Vitamin D deficiency    Past Surgical History:  Procedure Laterality Date   CATARACT EXTRACTION, BILATERAL  04/06/2017   with cypass stent implanted   COLONOSCOPY     cypess stent left eye Left 2018   POLYPECTOMY     Family History  Problem Relation Age of Onset   Stroke Mother 77   Hypertension Mother  Sudden death Mother    Anxiety disorder Mother    Lung cancer Father 57   Colon cancer Neg Hx    Esophageal cancer Neg Hx    Stomach cancer Neg Hx    Rectal cancer Neg Hx    Social History   Socioeconomic History   Marital status: Married    Spouse name: Mortimer Fries "Patrick [place]" [place]   Number of children: 2   Years of education: Not on file   Highest education level: Not on file  Occupational History   Occupation: Hair dresser  Tobacco Use   Smoking status: Never   Smokeless tobacco: Never  Vaping Use   Vaping Use: Never used  Substance and Sexual Activity   Alcohol use: No   Drug use: No   Sexual activity: Yes    Birth control/protection: None  Other Topics Concern   Not on file  Social History  Narrative   Not on file   Social Determinants of Health   Financial Resource Strain: Low Risk    Difficulty of Paying Living Expenses: Not hard at all  Food Insecurity: No Food Insecurity   Worried About Charity fundraiser in the Last Year: Never true   [place] in the Last Year: Never true  Transportation Needs: No Transportation Needs   Lack of Transportation (Medical): No   Lack of Transportation (Non-Medical): No  Physical Activity: Sufficiently Active   Days of Exercise per Week: 5 days   Minutes of Exercise per Session: 30 min  Stress: No Stress Concern Present   Feeling of Stress : Not at all  Social Connections: Socially Integrated   Frequency of Communication with Friends and Family: More than three times a week   Frequency of Social Gatherings with Friends and Family: More than three times a week   Attends Religious Services: More than 4 times per year   Active Member of [employer] or Organizations: Yes   Attends Music therapist: More than 4 times per year   Marital Status: Married    Tobacco Counseling Counseling given: Not Answered   Clinical Intake:  Pre-visit preparation completed: Yes  Pain : No/denies pain Pain Score: 0-No pain     BMI - recorded: 36.35 Nutritional Status: BMI > 30  Obese Nutritional Risks: None Diabetes: No  How often do you need to have someone help you when you read instructions, pamphlets, or other written materials from your doctor or pharmacy?: 1 - Never What is the last grade level you completed in school?: HSG; Costmetology  Diabetic? no  Interpreter Needed?: No  Information entered by :: Lisette Abu, LPN   Activities of Daily Living In your present state of health, do you have any difficulty performing the following activities: 06/17/2021 06/17/2020  Hearing? N N  Vision? N N  Difficulty concentrating or making decisions? N N  Walking or climbing stairs? N N  Dressing or bathing? N N  Doing  errands, shopping? N N  Preparing Food and eating ? N N  Using the Toilet? N N  In the past six months, have you accidently leaked urine? N N  Comment wear protection -  Do you have problems with loss of bowel control? N N  Managing your Medications? N N  Managing your Finances? N N  Housekeeping or managing your Housekeeping? N N  Some recent data might be hidden    Patient Care Team: Plotnikov, Evie Lacks, MD as PCP - General Ladene Artist,  MD as Attending Physician (Gastroenterology) Warden Fillers, MD as Consulting Physician (Ophthalmology)  Indicate any recent Medical Services you may have received from other than [facility] providers in the past year (date may be approximate).     Assessment:   This is a routine wellness examination for [place].  Hearing/Vision screen Hearing Screening - Comments:: Patient denied any hearing difficulty. Vision Screening - Comments:: Patient wears glasses.  Eye exams done every 3-4 months by Dr. Midge Aver.  Dietary issues and exercise activities discussed: Current Exercise Habits: Home exercise routine, Time (Minutes): 30, Frequency (Times/Week): 5, Weekly Exercise (Minutes/Week): 150, Intensity: Moderate, Exercise limited by: None identified   Goals Addressed   None   Depression Screen PHQ 2/9 Scores 06/17/2021 06/17/2020 04/20/2020 12/31/2018 12/25/2017 12/11/2017 11/21/2016  PHQ - 2 Score 0 0 0 0 1 0 0  PHQ- 9 Score - - - - 5 - -    Fall Risk Fall Risk  06/17/2021 06/17/2020 04/20/2020 12/31/2018 12/11/2017  Falls in the past year? 0 0 1 0 No  Number falls in past yr: 0 0 0 0 -  Injury with Fall? 0 0 0 - -  Risk for fall due to : No Fall Risks No Fall Risks - - -  Follow up Falls evaluation completed Falls evaluation completed;Education provided Falls evaluation completed - -    FALL RISK PREVENTION PERTAINING TO THE HOME:  Any stairs in or around the home? No  If so, are there any without handrails? No  Home free of loose throw rugs in  walkways, pet beds, electrical cords, etc? Yes  Adequate lighting in your home to reduce risk of falls? Yes   ASSISTIVE DEVICES UTILIZED TO PREVENT FALLS:  Life alert? No  Use of a cane, walker or w/c? No  Grab bars in the bathroom? Yes  Shower chair or bench in shower? No  Elevated toilet seat or a handicapped toilet? No   TIMED UP AND GO:  Was the test performed? Yes .  Length of time to ambulate 10 feet: 6 sec.   Gait steady and fast without use of assistive device  Cognitive Function: Normal cognitive status assessed by direct observation by this Nurse Health Advisor. No abnormalities found.          Immunizations Immunization History  Administered Date(s) Administered   Fluad Quad(high Dose 65+) 10/21/2019, 08/19/2020   Influenza Whole 09/01/2008, 08/31/2009, 09/27/2010   Influenza, High Dose Seasonal PF 09/18/2013, 11/21/2016, 10/17/2017, 12/31/2018   Influenza, Seasonal, Injecte, Preservative Fre 11/12/2012   Influenza,inj,Quad PF,6+ Mos 11/10/2014, 11/16/2015   PFIZER(Purple Top)SARS-COV-2 Vaccination 03/16/2020, 04/08/2020, 10/17/2020   Pneumococcal Conjugate-13 11/10/2014   Pneumococcal Polysaccharide-23 11/12/2012   Td 05/05/2010   Tdap 08/19/2020   Zoster, Live 12/21/2009    TDAP status: Up to date  Flu Vaccine status: Up to date  Pneumococcal vaccine status: Up to date  Covid-19 vaccine status: Completed vaccines  Qualifies for Shingles Vaccine? Yes   Zostavax completed Yes   Shingrix Completed?: No.    Education has been provided regarding the importance of this vaccine. Patient has been advised to call insurance company to determine out of pocket expense if they have not yet received this vaccine. Advised may also receive vaccine at local pharmacy or Health Dept. Verbalized acceptance and understanding.  Screening Tests Health Maintenance  Topic Date Due   Hepatitis C Screening  Never done   Zoster Vaccines- Shingrix (1 of 2) Never done    COVID-19 Vaccine (4 - Booster for  Pfizer series) 02/14/2021   INFLUENZA VACCINE  07/05/2021   MAMMOGRAM  03/12/2022   COLONOSCOPY (Pts 45-36yrs Insurance coverage will need to be confirmed)  04/21/2022   TETANUS/TDAP  08/19/2030   DEXA SCAN  Completed   PNA vac Low Risk Adult  Completed   HPV VACCINES  Aged Out    Health Maintenance  Health Maintenance Due  Topic Date Due   Hepatitis C Screening  Never done   Zoster Vaccines- Shingrix (1 of 2) Never done   COVID-19 Vaccine (4 - Booster for Pfizer series) 02/14/2021    Colorectal cancer screening: Type of screening: Colonoscopy. Completed 04/22/2019. Repeat every 3 years  Mammogram status: Completed 03/12/2020. Repeat every year  Bone Density status: Completed 07/31/2017. Results reflect: Bone density results: OSTEOPENIA. Repeat every 2 years.  Lung Cancer Screening: (Low Dose CT Chest recommended if Age 40-80 years, 30 pack-year currently smoking OR have quit w/in 15years.) does not qualify.   Lung Cancer Screening Referral: no  Additional Screening:  Hepatitis C Screening: does qualify; Completed no  Vision Screening: Recommended annual ophthalmology exams for early detection of glaucoma and other disorders of the eye. Is the patient up to date with their annual eye exam?  Yes  Who is the provider or what is the name of the office in which the patient attends annual eye exams? Midge Aver, MD. If pt is not established with a provider, would they like to be referred to a provider to establish care? No .   Dental Screening: Recommended annual dental exams for proper oral hygiene  Community Resource Referral / Chronic Care Management: CRR required this visit?  No   CCM required this visit?  No      Plan:     I have personally reviewed and noted the following in the patient's chart:   Medical and social history Use of alcohol, tobacco or illicit drugs  Current medications and supplements including opioid prescriptions.   Functional ability and status Nutritional status Physical activity Advanced directives List of other physicians Hospitalizations, surgeries, and ER visits in previous 12 months Vitals Screenings to include cognitive, depression, and falls Referrals and appointments  In addition, I have reviewed and discussed with patient certain preventive protocols, quality metrics, and best practice recommendations. A written personalized care plan for preventive services as well as general preventive health recommendations were provided to patient.     Sheral Flow, LPN   0/16/0109   Nurse Notes: n/a  Medical screening examination/treatment/procedure(s) were performed by non-physician practitioner and as supervising physician I was immediately available for consultation/collaboration.  I agree with above. Lew Dawes, MD

## 2021-06-17 NOTE — Patient Instructions (Addendum)
Diane Sawyer , Thank you for taking time to come for your Medicare Wellness Visit. I appreciate your ongoing commitment to your health goals. Please review the following plan we discussed and let me know if I can assist you in the future.   Screening recommendations/referrals: Colonoscopy: 04/22/2019; due every 3 years Mammogram: 03/12/2020; due every year Bone Density: 07/31/2017; due every 2 years Recommended yearly ophthalmology/optometry visit for glaucoma screening and checkup Recommended yearly dental visit for hygiene and checkup  Vaccinations: Influenza vaccine: 08/19/2020 Pneumococcal vaccine: 11/12/2012, 11/10/2014 Tdap vaccine: 08/19/2020; due every 10 years Shingles vaccine: never done;   Please call your insurance company to determine your out of pocket expense for the Shingrix vaccine. You may receive this vaccine at your local pharmacy. Covid-19: 03/16/2020, 04/08/2020, 10/17/2020  Advanced directives: Please bring a copy of your health care power of attorney and living will to the office at your convenience.  Conditions/risks identified: Client understands the importance of follow-up with providers by attending scheduled visits and discussed goals to eat healthier, increase physical activity, exercise the brain, socialize more,  get enough rest and make time for laughter.  Next appointment: Please schedule your next Medicare Wellness Visit with your Nurse Health Advisor in 1 year by calling [phone].   Preventive Care 74 Years and Older, Female Preventive care refers to lifestyle choices and visits with your health care provider that can promote health and wellness. What does preventive care include? A yearly physical exam. This is also called an annual well check. Dental exams once or twice a year. Routine eye exams. Ask your health care provider how often you should have your eyes checked. Personal lifestyle choices, including: Daily care of your teeth and gums. Regular  physical activity. Eating a healthy diet. Avoiding tobacco and drug use. Limiting alcohol use. Practicing safe sex. Taking low-dose aspirin every day. Taking vitamin and mineral supplements as recommended by your health care provider. What happens during an annual well check? The services and screenings done by your health care provider during your annual well check will depend on your age, overall health, lifestyle risk factors, and family history of disease. Counseling  Your health care provider may ask you questions about your: Alcohol use. Tobacco use. Drug use. Emotional well-being. Home and relationship well-being. Sexual activity. Eating habits. History of falls. Memory and ability to understand (cognition). Work and work Statistician. Reproductive health. Screening  You may have the following tests or measurements: Height, weight, and BMI. Blood pressure. Lipid and cholesterol levels. These may be checked every 5 years, or more frequently if you are over 17 years old. Skin check. Lung cancer screening. You may have this screening every year starting at age 74 if you have a 30-pack-year history of smoking and currently smoke or have quit within the past 15 years. Fecal occult blood test (FOBT) of the stool. You may have this test every year starting at age 74. Flexible sigmoidoscopy or colonoscopy. You may have a sigmoidoscopy every 5 years or a colonoscopy every 10 years starting at age 74. Hepatitis C blood test. Hepatitis B blood test. Sexually transmitted disease (STD) testing. Diabetes screening. This is done by checking your blood sugar (glucose) after you have not eaten for a while (fasting). You may have this done every 1-3 years. Bone density scan. This is done to screen for osteoporosis. You may have this done starting at age 74. Mammogram. This may be done every 1-2 years. Talk to your health care provider about how often  you should have regular mammograms. Talk  with your health care provider about your test results, treatment options, and if necessary, the need for more tests. Vaccines  Your health care provider may recommend certain vaccines, such as: Influenza vaccine. This is recommended every year. Tetanus, diphtheria, and acellular pertussis (Tdap, Td) vaccine. You may need a Td booster every 10 years. Zoster vaccine. You may need this after age 74. Pneumococcal 13-valent conjugate (PCV13) vaccine. One dose is recommended after age 74. Pneumococcal polysaccharide (PPSV23) vaccine. One dose is recommended after age 74. Talk to your health care provider about which screenings and vaccines you need and how often you need them. This information is not intended to replace advice given to you by your health care provider. Make sure you discuss any questions you have with your health care provider. Document Released: 12/18/2015 Document Revised: 08/10/2016 Document Reviewed: 09/22/2015 Elsevier Interactive Patient Education  2017 [place] Prevention in the Home Falls can cause injuries. They can happen to people of all ages. There are many things you can do to make your home safe and to help prevent falls. What can I do on the outside of my home? Regularly fix the edges of walkways and driveways and fix any cracks. Remove anything that might make you trip as you walk through a door, such as a raised step or threshold. Trim any bushes or trees on the path to your home. Use bright outdoor lighting. Clear any walking paths of anything that might make someone trip, such as rocks or tools. Regularly check to see if handrails are loose or broken. Make sure that both sides of any steps have handrails. Any raised decks and porches should have guardrails on the edges. Have any leaves, snow, or ice cleared regularly. Use sand or salt on walking paths during winter. Clean up any spills in your garage right away. This includes oil or grease  spills. What can I do in the bathroom? Use night lights. Install grab bars by the toilet and in the tub and shower. Do not use towel bars as grab bars. Use non-skid mats or decals in the tub or shower. If you need to sit down in the shower, use a plastic, non-slip stool. Keep the floor dry. Clean up any water that spills on the floor as soon as it happens. Remove soap buildup in the tub or shower regularly. Attach bath mats securely with double-sided non-slip rug tape. Do not have throw rugs and other things on the floor that can make you trip. What can I do in the bedroom? Use night lights. Make sure that you have a light by your bed that is easy to reach. Do not use any sheets or blankets that are too big for your bed. They should not hang down onto the floor. Have a firm chair that has side arms. You can use this for support while you get dressed. Do not have throw rugs and other things on the floor that can make you trip. What can I do in the kitchen? Clean up any spills right away. Avoid walking on wet floors. Keep items that you use a lot in easy-to-reach places. If you need to reach something above you, use a strong step stool that has a grab bar. Keep electrical cords out of the way. Do not use floor polish or wax that makes floors slippery. If you must use wax, use non-skid floor wax. Do not have throw rugs and other things on  the floor that can make you trip. What can I do with my stairs? Do not leave any items on the stairs. Make sure that there are handrails on both sides of the stairs and use them. Fix handrails that are broken or loose. Make sure that handrails are as long as the stairways. Check any carpeting to make sure that it is firmly attached to the stairs. Fix any carpet that is loose or worn. Avoid having throw rugs at the top or bottom of the stairs. If you do have throw rugs, attach them to the floor with carpet tape. Make sure that you have a light switch at the  top of the stairs and the bottom of the stairs. If you do not have them, ask someone to add them for you. What else can I do to help prevent falls? Wear shoes that: Do not have high heels. Have rubber bottoms. Are comfortable and fit you well. Are closed at the toe. Do not wear sandals. If you use a stepladder: Make sure that it is fully opened. Do not climb a closed stepladder. Make sure that both sides of the stepladder are locked into place. Ask someone to hold it for you, if possible. Clearly mark and make sure that you can see: Any grab bars or handrails. First and last steps. Where the edge of each step is. Use tools that help you move around (mobility aids) if they are needed. These include: Canes. Walkers. Scooters. Crutches. Turn on the lights when you go into a dark area. Replace any light bulbs as soon as they burn out. Set up your furniture so you have a clear path. Avoid moving your furniture around. If any of your floors are uneven, fix them. If there are any pets around you, be aware of where they are. Review your medicines with your doctor. Some medicines can make you feel dizzy. This can increase your chance of falling. Ask your doctor what other things that you can do to help prevent falls. This information is not intended to replace advice given to you by your health care provider. Make sure you discuss any questions you have with your health care provider. Document Released: 09/17/2009 Document Revised: 04/28/2016 Document Reviewed: 12/26/2014 Elsevier Interactive Patient Education  2017 [employer].

## 2021-06-29 ENCOUNTER — Other Ambulatory Visit: Payer: Self-pay | Admitting: Internal Medicine

## 2021-06-29 DIAGNOSIS — I10 Essential (primary) hypertension: Secondary | ICD-10-CM

## 2021-07-01 DIAGNOSIS — R350 Frequency of micturition: Secondary | ICD-10-CM | POA: Diagnosis not present

## 2021-07-07 ENCOUNTER — Ambulatory Visit (INDEPENDENT_AMBULATORY_CARE_PROVIDER_SITE_OTHER): Payer: Medicare Other | Admitting: Internal Medicine

## 2021-07-07 ENCOUNTER — Encounter: Payer: Self-pay | Admitting: Internal Medicine

## 2021-07-07 ENCOUNTER — Other Ambulatory Visit: Payer: Self-pay

## 2021-07-07 DIAGNOSIS — N814 Uterovaginal prolapse, unspecified: Secondary | ICD-10-CM | POA: Diagnosis not present

## 2021-07-07 NOTE — Progress Notes (Signed)
   Subjective:   Patient ID: Diane Sawyer, female    DOB: [birth year], 75 y.o.   MRN: [id number]  HPI The patient is a 75 YO female coming in for vaginal discomfort. Taking keflex for UTI currently and is worried about swelling from lifting recently. Seen at urgent care 07/01/21 and they were unable to obtain a specimen and treated her empirically. They were able to have her bring back sample and urine culture with bacteria sensitive to keflex. She is still taking this for another day and this is improved. Did have a prolapse over the weekend with lifting. Noticed in the shower there was a slight bulge at the vaginal opening. Was able to reduce on its own. This is not present now and has not come back.  Review of Systems  Constitutional: Negative.   HENT: Negative.    Eyes: Negative.   Respiratory:  Negative for cough, chest tightness and shortness of breath.   Cardiovascular:  Negative for chest pain, palpitations and leg swelling.  Gastrointestinal:  Negative for abdominal distention, abdominal pain, constipation, diarrhea, nausea and vomiting.  Genitourinary:        Vaginal pressure  Musculoskeletal: Negative.   Skin: Negative.   Neurological: Negative.   Psychiatric/Behavioral: Negative.     Objective:  Physical Exam Constitutional:      Appearance: She is well-developed.  HENT:     Head: Normocephalic and atraumatic.  Cardiovascular:     Rate and Rhythm: Normal rate and regular rhythm.  Pulmonary:     Effort: Pulmonary effort is normal. No respiratory distress.     Breath sounds: Normal breath sounds. No wheezing or rales.  Abdominal:     General: Bowel sounds are normal. There is no distension.     Palpations: Abdomen is soft.     Tenderness: There is no abdominal tenderness. There is no rebound.  Genitourinary:    Comments: Deferred pelvic exam as no current symptoms Musculoskeletal:     Cervical back: Normal range of motion.  Skin:    General: Skin is warm and dry.   Neurological:     Mental Status: She is alert and oriented to person, place, and time.     Coordination: Coordination normal.    Vitals:   07/07/21 0816  BP: 130/80  Pulse: 78  Resp: 18  Temp: 98.5 F (36.9 C)  TempSrc: Oral  SpO2: 98%  Weight: 202 lb (91.6 kg)  Height: '5\' 3"'$  (1.6 m)    This visit occurred during the SARS-CoV-2 public health emergency.  Safety protocols were in place, including screening questions prior to the visit, additional usage of staff PPE, and extensive cleaning of exam room while observing appropriate contact time as indicated for disinfecting solutions.   Assessment & Plan:

## 2021-07-07 NOTE — Assessment & Plan Note (Signed)
Most likely based on description. As not present at this time pelvic exam deferred. If recurrent would advise this. She is advised to finish antibiotics from urgent care and avoid heavy lifting or straining.

## 2021-07-07 NOTE — Patient Instructions (Signed)
Avoid heavy lifting or straining to help avoid getting the prolapse to come back.  If this comes back let us know and we can get you in with a gynecologist.

## 2021-07-10 ENCOUNTER — Other Ambulatory Visit: Payer: Self-pay | Admitting: Internal Medicine

## 2021-07-10 DIAGNOSIS — K219 Gastro-esophageal reflux disease without esophagitis: Secondary | ICD-10-CM

## 2021-08-22 ENCOUNTER — Other Ambulatory Visit: Payer: Self-pay | Admitting: Internal Medicine

## 2021-08-30 ENCOUNTER — Other Ambulatory Visit: Payer: Self-pay

## 2021-08-30 ENCOUNTER — Ambulatory Visit: Payer: Medicare Other | Admitting: Internal Medicine

## 2021-08-30 ENCOUNTER — Other Ambulatory Visit (INDEPENDENT_AMBULATORY_CARE_PROVIDER_SITE_OTHER): Payer: Medicare Other

## 2021-08-30 DIAGNOSIS — E785 Hyperlipidemia, unspecified: Secondary | ICD-10-CM | POA: Diagnosis not present

## 2021-08-30 DIAGNOSIS — R739 Hyperglycemia, unspecified: Secondary | ICD-10-CM | POA: Diagnosis not present

## 2021-08-30 DIAGNOSIS — I10 Essential (primary) hypertension: Secondary | ICD-10-CM

## 2021-08-30 LAB — COMPREHENSIVE METABOLIC PANEL
ALT: 7 U/L (ref 0–35)
AST: 12 U/L (ref 0–37)
Albumin: 4.2 g/dL (ref 3.5–5.2)
Alkaline Phosphatase: 45 U/L (ref 39–117)
BUN: 14 mg/dL (ref 6–23)
CO2: 29 mEq/L (ref 19–32)
Calcium: 9.6 mg/dL (ref 8.4–10.5)
Chloride: 106 mEq/L (ref 96–112)
Creatinine, Ser: 0.83 mg/dL (ref 0.40–1.20)
GFR: 69.58 mL/min (ref >60.00)
Glucose, Bld: 86 mg/dL (ref 70–99)
Potassium: 4.4 mEq/L (ref 3.5–5.1)
Sodium: 142 mEq/L (ref 135–145)
Total Bilirubin: 0.5 mg/dL (ref 0.2–1.2)
Total Protein: 6.7 g/dL (ref 6.0–8.3)

## 2021-08-30 LAB — CBC WITH DIFFERENTIAL/PLATELET
Basophils Absolute: 0 10*3/uL (ref 0.0–0.1)
Basophils Relative: 0.7 % (ref 0.0–3.0)
Eosinophils Absolute: 0.2 10*3/uL (ref 0.0–0.7)
Eosinophils Relative: 4 % (ref 0.0–5.0)
HCT: 40.6 % (ref 36.0–46.0)
Hemoglobin: 13.4 g/dL (ref 12.0–15.0)
Lymphocytes Relative: 27.3 % (ref 12.0–46.0)
Lymphs Abs: 1.5 10*3/uL (ref 0.7–4.0)
MCHC: 33.1 g/dL (ref 30.0–36.0)
MCV: 91.9 fl (ref 78.0–100.0)
Monocytes Absolute: 0.4 10*3/uL (ref 0.1–1.0)
Monocytes Relative: 8 % (ref 3.0–12.0)
Neutro Abs: 3.2 10*3/uL (ref 1.4–7.7)
Neutrophils Relative %: 60 % (ref 43.0–77.0)
Platelets: 198 10*3/uL (ref 150.0–400.0)
RBC: 4.41 Mil/uL (ref 3.87–5.11)
RDW: 13.8 % (ref 11.5–15.5)
WBC: 5.3 10*3/uL (ref 4.0–10.5)

## 2021-08-30 LAB — URINALYSIS
Bilirubin Urine: NEGATIVE
Hgb urine dipstick: NEGATIVE
Ketones, ur: NEGATIVE
Leukocytes,Ua: NEGATIVE
Nitrite: NEGATIVE
Specific Gravity, Urine: 1.02 (ref 1.000–1.030)
Total Protein, Urine: NEGATIVE
Urine Glucose: NEGATIVE
Urobilinogen, UA: 0.2 (ref 0.0–1.0)
pH: 6 (ref 5.0–8.0)

## 2021-08-30 LAB — LIPID PANEL
Cholesterol: 166 mg/dL (ref 0–200)
HDL: 58 mg/dL (ref >39.00)
LDL Cholesterol: 91 mg/dL (ref 0–99)
NonHDL: 107.7
Total CHOL/HDL Ratio: 3
Triglycerides: 85 mg/dL (ref 0.0–149.0)
VLDL: 17 mg/dL (ref 0.0–40.0)

## 2021-08-30 LAB — TSH: TSH: 0.91 u[IU]/mL (ref 0.35–5.50)

## 2021-09-01 ENCOUNTER — Other Ambulatory Visit: Payer: Self-pay

## 2021-09-01 ENCOUNTER — Ambulatory Visit (INDEPENDENT_AMBULATORY_CARE_PROVIDER_SITE_OTHER): Payer: Medicare Other | Admitting: Internal Medicine

## 2021-09-01 ENCOUNTER — Encounter: Payer: Self-pay | Admitting: Internal Medicine

## 2021-09-01 VITALS — BP 130/84 | HR 85 | Temp 98.6°F | Ht 63.0 in | Wt 198.4 lb

## 2021-09-01 DIAGNOSIS — N814 Uterovaginal prolapse, unspecified: Secondary | ICD-10-CM | POA: Diagnosis not present

## 2021-09-01 DIAGNOSIS — R7303 Prediabetes: Secondary | ICD-10-CM | POA: Diagnosis not present

## 2021-09-01 DIAGNOSIS — I10 Essential (primary) hypertension: Secondary | ICD-10-CM | POA: Diagnosis not present

## 2021-09-01 DIAGNOSIS — Z8616 Personal history of COVID-19: Secondary | ICD-10-CM | POA: Diagnosis not present

## 2021-09-01 DIAGNOSIS — Z23 Encounter for immunization: Secondary | ICD-10-CM | POA: Diagnosis not present

## 2021-09-01 DIAGNOSIS — F419 Anxiety disorder, unspecified: Secondary | ICD-10-CM

## 2021-09-01 DIAGNOSIS — J309 Allergic rhinitis, unspecified: Secondary | ICD-10-CM

## 2021-09-01 MED ORDER — AMLODIPINE BESYLATE 2.5 MG PO TABS: 2.5000 mg | ORAL_TABLET | Freq: Every day | ORAL | 3 refills | Status: DC

## 2021-09-01 MED ORDER — IPRATROPIUM BROMIDE 0.06 % NA SOLN: 2.0000 | Freq: Three times a day (TID) | NASAL | 3 refills | Status: DC

## 2021-09-01 MED ORDER — IRBESARTAN 150 MG PO TABS: ORAL_TABLET | ORAL | 3 refills | Status: DC

## 2021-09-01 NOTE — Assessment & Plan Note (Signed)
Check A1c. 

## 2021-09-01 NOTE — Assessment & Plan Note (Signed)
Post-COVID Allegra, Atrovent, Flonase

## 2021-09-01 NOTE — Assessment & Plan Note (Addendum)
GYN ref - new uterine prolapse per Dr Sharlet Salina. Discussed

## 2021-09-01 NOTE — Progress Notes (Signed)
Subjective:  Patient ID: Diane Sawyer, female    DOB: 1946/08/29  Age: 75 y.o. MRN: [id number]  CC: Follow-up (6 month f/u- Flu shot)   HPI  Diane Sawyer presents for HTN, anxiety, OA C/o uterine prolapse per Dr Sharlet Salina  Outpatient Medications Prior to Visit  Medication Sig Dispense Refill   acetaminophen (TYLENOL) 500 MG tablet Take 1,000 mg by mouth every 6 (six) hours as needed for mild pain.     ALPRAZolam (XANAX) 0.25 MG tablet TAKE 1 TABLET(0.25 MG) BY MOUTH TWICE DAILY AS NEEDED FOR ANXIETY 60 tablet 3   clobetasol cream (TEMOVATE) 8.46 % Apply 1 application topically daily as needed (dermatosis). Reported on 05/23/2016     dorzolamide (TRUSOPT) 2 % ophthalmic solution INT 1 GTT IN OD TID     dorzolamide-timolol (COSOPT) 22.3-6.8 MG/ML ophthalmic solution 1 drop 2 (two) times daily.     etodolac (LODINE) 500 MG tablet Take 500 mg by mouth as needed.      fexofenadine (ALLEGRA) 180 MG tablet Take 180 mg by mouth daily.     IRON PO Take 65 mg by mouth daily.     latanoprost (XALATAN) 0.005 % ophthalmic solution INT 1 GTT IN OU HS     methocarbamol (ROBAXIN) 500 MG tablet Take 1 tablet (500 mg total) by mouth every 8 (eight) hours as needed for muscle spasms. 60 tablet 1   pantoprazole (PROTONIX) 40 MG tablet TAKE 1 TABLET(40 MG) BY MOUTH TWICE DAILY 180 tablet 3   timolol (BETIMOL) 0.25 % ophthalmic solution Place 1 drop into the right eye daily.      timolol (TIMOPTIC) 0.5 % ophthalmic solution INT 1 GTT IN OU BID     VITAMIN D PO Take 1,000 Units by mouth daily.     amLODipine (NORVASC) 5 MG tablet TAKE 1 TABLET(5 MG) BY MOUTH DAILY 90 tablet 3   irbesartan (AVAPRO) 150 MG tablet TAKE 1 TABLET(150 MG) BY MOUTH DAILY 90 tablet 3   cephALEXin (KEFLEX) 500 MG capsule Take 500 mg by mouth 2 (two) times daily. (Patient not taking: Reported on 09/01/2021)     No facility-administered medications prior to visit.    ROS: Review of Systems  Constitutional:  Negative for activity  change, appetite change, chills, fatigue and unexpected weight change.  HENT:  Negative for congestion, mouth sores and sinus pressure.   Eyes:  Negative for visual disturbance.  Respiratory:  Negative for cough and chest tightness.   Gastrointestinal:  Negative for abdominal pain and nausea.  Genitourinary:  Negative for difficulty urinating, frequency and vaginal pain.  Musculoskeletal:  Negative for back pain and gait problem.  Skin:  Negative for pallor and rash.  Neurological:  Negative for dizziness, tremors, weakness, numbness and headaches.  Psychiatric/Behavioral:  Negative for confusion, sleep disturbance and suicidal ideas. The patient is nervous/anxious.    Objective:  BP 130/84 (BP Location: Left Arm)   Pulse 85   Temp 98.6 F (37 C) (Oral)   Ht 5\' 3"  (1.6 m)   Wt 198 lb 6.4 oz (90 kg)   SpO2 98%   BMI 35.14 kg/m   BP Readings from Last 3 Encounters:  09/01/21 130/84  07/07/21 130/80  06/17/21 122/70    Wt Readings from Last 3 Encounters:  09/01/21 198 lb 6.4 oz (90 kg)  07/07/21 202 lb (91.6 kg)  06/17/21 205 lb 3.2 oz (93.1 kg)    Physical Exam Constitutional:      General: She is not  in acute distress.    Appearance: She is well-developed.  HENT:     Head: Normocephalic.     Right Ear: External ear normal.     Left Ear: External ear normal.     Nose: Nose normal.  Eyes:     General:        Right eye: No discharge.        Left eye: No discharge.     Conjunctiva/sclera: Conjunctivae normal.     Pupils: Pupils are equal, round, and reactive to light.  Neck:     Thyroid: No thyromegaly.     Vascular: No JVD.     Trachea: No tracheal deviation.  Cardiovascular:     Rate and Rhythm: Normal rate and regular rhythm.     Heart sounds: Normal heart sounds.  Pulmonary:     Effort: No respiratory distress.     Breath sounds: No stridor. No wheezing.  Abdominal:     General: Bowel sounds are normal. There is no distension.     Palpations: Abdomen is  soft. There is no mass.     Tenderness: There is no abdominal tenderness. There is no guarding or rebound.  Musculoskeletal:        General: No tenderness.     Cervical back: Normal range of motion and neck supple. No rigidity.  Lymphadenopathy:     Cervical: No cervical adenopathy.  Skin:    Findings: No erythema or rash.  Neurological:     Cranial Nerves: No cranial nerve deficit.     Motor: No abnormal muscle tone.     Coordination: Coordination normal.     Deep Tendon Reflexes: Reflexes normal.  Psychiatric:        Behavior: Behavior normal.        Thought Content: Thought content normal.        Judgment: Judgment normal.    Lab Results  Component Value Date   WBC 5.3 08/30/2021   HGB 13.4 08/30/2021   HCT 40.6 08/30/2021   PLT 198.0 08/30/2021   GLUCOSE 86 08/30/2021   CHOL 166 08/30/2021   TRIG 85.0 08/30/2021   HDL 58.00 08/30/2021   LDLCALC 91 08/30/2021   ALT 7 08/30/2021   AST 12 08/30/2021   NA 142 08/30/2021   K 4.4 08/30/2021   CL 106 08/30/2021   CREATININE 0.83 08/30/2021   BUN 14 08/30/2021   CO2 29 08/30/2021   TSH 0.91 08/30/2021   HGBA1C 5.7 10/21/2019    MM 3D SCREEN BREAST BILATERAL  Result Date: 03/12/2020 CLINICAL DATA:  Screening. EXAM: DIGITAL SCREENING BILATERAL MAMMOGRAM WITH TOMO AND CAD COMPARISON:  Previous exam(s). ACR Breast Density Category b: There are scattered areas of fibroglandular density. FINDINGS: There are no findings suspicious for malignancy. Images were processed with CAD. IMPRESSION: No mammographic evidence of malignancy. A result letter of this screening mammogram will be mailed directly to the patient. RECOMMENDATION: Screening mammogram in one year. (Code:SM-B-01Y) BI-RADS CATEGORY  1: Negative. Electronically Signed   By: Zerita Boers M.D.   On: 03/12/2020 10:41    Assessment & Plan:   Problem List Items Addressed This Visit     Allergic rhinitis    Post-COVID Allegra, Atrovent, Flonase      Anxiety     Continue on Xanax prn  Potential benefits of a long term benzodiazepines  use as well as potential risks  and complications were explained to the patient and were aknowledged.      History of COVID-19  Post- nasal drip remains Atrovent nasal was prescribed      Hypertension    Cont on Amlodipine 2.5 mg/d and Irbesartan 150 mg/d      Relevant Medications   irbesartan (AVAPRO) 150 MG tablet   amLODipine (NORVASC) 2.5 MG tablet   Prediabetes    Check A1c      Uterine prolapse - Primary    GYN ref - new uterine prolapse per Dr Sharlet Salina. Discussed      Relevant Orders   Ambulatory referral to Gynecology   Other Visit Diagnoses     Needs flu shot       Relevant Orders   Flu Vaccine QUAD High Dose(Fluad) (Completed)         Walker Kehr, MD

## 2021-09-01 NOTE — Assessment & Plan Note (Signed)
Cont on Amlodipine 2.5 mg/d and Irbesartan 150 mg/d

## 2021-09-01 NOTE — Assessment & Plan Note (Signed)
Post- nasal drip remains Atrovent nasal was prescribed

## 2021-09-01 NOTE — Assessment & Plan Note (Signed)
Continue on Xanax prn  Potential benefits of a long term benzodiazepines  use as well as potential risks  and complications were explained to the patient and were aknowledged. 

## 2021-09-13 DIAGNOSIS — H401111 Primary open-angle glaucoma, right eye, mild stage: Secondary | ICD-10-CM | POA: Diagnosis not present

## 2021-09-13 DIAGNOSIS — Z961 Presence of intraocular lens: Secondary | ICD-10-CM | POA: Diagnosis not present

## 2021-09-13 DIAGNOSIS — H26492 Other secondary cataract, left eye: Secondary | ICD-10-CM | POA: Diagnosis not present

## 2021-09-13 DIAGNOSIS — H401122 Primary open-angle glaucoma, left eye, moderate stage: Secondary | ICD-10-CM | POA: Diagnosis not present

## 2021-09-13 DIAGNOSIS — H2511 Age-related nuclear cataract, right eye: Secondary | ICD-10-CM | POA: Diagnosis not present

## 2021-09-24 ENCOUNTER — Telehealth: Payer: Self-pay

## 2021-09-24 NOTE — Telephone Encounter (Signed)
Prolia VOB initiated via [email or web address] ? ?Last OV:  ?Next OV:  ?Last Prolia inj: 04/14/21 ?Next Prolia inj DUE: 10/16/21 ? ?

## 2021-10-01 NOTE — Telephone Encounter (Signed)
Pt ready for scheduling on or after 10/16/21  Out-of-pocket cost due at time of visit: $0.00  Primary: Medicare Prolia co-insurance: 20% (approximately $255) Admin fee co-insurance: 20% (approximately $25)  Secondary: BCBS Supp Prolia co-insurance: Covers Medicare Part B co-insurance and deductible.  Admin fee co-insurance: Covers Medicare Part B co-insurance and deductible.   Deductible: $233 of $233 met  Prior Auth: not required PA# Valid:   ** This summary of benefits is an estimation of the patient's out-of-pocket cost. Exact cost may vary based on individual plan coverage.   

## 2021-10-06 NOTE — Telephone Encounter (Signed)
Pt scheduled for 11.16.22 at 9:20am

## 2021-10-20 ENCOUNTER — Ambulatory Visit: Payer: Medicare Other

## 2021-10-20 ENCOUNTER — Ambulatory Visit (INDEPENDENT_AMBULATORY_CARE_PROVIDER_SITE_OTHER): Payer: Medicare Other

## 2021-10-20 ENCOUNTER — Other Ambulatory Visit: Payer: Self-pay

## 2021-10-20 DIAGNOSIS — M8580 Other specified disorders of bone density and structure, unspecified site: Secondary | ICD-10-CM | POA: Diagnosis not present

## 2021-10-20 MED ORDER — DENOSUMAB 60 MG/ML ~~LOC~~ SOSY
60.0000 mg | PREFILLED_SYRINGE | Freq: Once | SUBCUTANEOUS | Status: AC
  Administered 2021-10-20: 60 mg via SUBCUTANEOUS

## 2021-10-20 NOTE — Progress Notes (Addendum)
Pt given Prolia injection w/o any complications.  Medical screening examination/treatment/procedure(s) were performed by non-physician practitioner and as supervising physician I was immediately available for consultation/collaboration.  I agree with above. Lew Dawes, MD

## 2021-10-27 ENCOUNTER — Ambulatory Visit (INDEPENDENT_AMBULATORY_CARE_PROVIDER_SITE_OTHER): Payer: Medicare Other | Admitting: Obstetrics and Gynecology

## 2021-10-27 ENCOUNTER — Other Ambulatory Visit: Payer: Self-pay

## 2021-10-27 ENCOUNTER — Encounter: Payer: Self-pay | Admitting: Obstetrics and Gynecology

## 2021-10-27 VITALS — BP 140/82 | HR 45 | Ht 63.0 in | Wt 198.0 lb

## 2021-10-27 DIAGNOSIS — N3946 Mixed incontinence: Secondary | ICD-10-CM

## 2021-10-27 DIAGNOSIS — N819 Female genital prolapse, unspecified: Secondary | ICD-10-CM

## 2021-10-27 DIAGNOSIS — N763 Subacute and chronic vulvitis: Secondary | ICD-10-CM | POA: Diagnosis not present

## 2021-10-27 MED ORDER — BETAMETHASONE VALERATE 0.1 % EX OINT: 1.0000 "application " | TOPICAL_OINTMENT | Freq: Two times a day (BID) | CUTANEOUS | 0 refills | Status: DC

## 2021-10-27 NOTE — Patient Instructions (Signed)
Try uberlube for vaginal lubrication.  Urinary Incontinence Urinary incontinence refers to a condition in which a person is unable to control where and when to pass urine. A person with this condition will urinate involuntarily. This means that the person urinates when he or she does not mean to. What are the causes? This condition may be caused by: Medicines. Infections. Constipation. Overactive bladder muscles. Weak bladder muscles. Weak pelvic floor muscles. These muscles provide support for the bladder, intestine, and, in women, the uterus. Enlarged prostate in men. The prostate is a gland near the bladder. When it gets too big, it can pinch the urethra. With the urethra blocked, the bladder can weaken and lose the ability to empty properly. Surgery. Emotional factors, such as anxiety, stress, or post-traumatic stress disorder (PTSD). Spinal cord injury, nerve injury, or other neurological conditions. Pelvic organ prolapse. This happens in women when organs move out of place and into the vagina. This movement can prevent the bladder and urethra from working properly. What increases the risk? The following factors may make you more likely to develop this condition: Age. The older you are, the higher the risk. Obesity. Being physically inactive. Pregnancy and childbirth. Menopause. Diseases that affect the nerves or spinal cord. Long-term, or chronic, coughing. This can increase pressure on the bladder and pelvic floor muscles. What are the signs or symptoms? Symptoms may vary depending on the type of urinary incontinence you have. They include: A sudden urge to urinate, and passing urine involuntarily before you can get to a bathroom (urge incontinence). Suddenly passing urine when doing activities that force urine to pass, such as coughing, laughing, exercising, or sneezing (stress incontinence). Needing to urinate often but urinating only a small amount, or constantly dribbling  urine (overflow incontinence). Urinating because you cannot get to the bathroom in time due to a physical disability, such as arthritis or injury, or due to a communication or thinking problem, such as Alzheimer's disease (functional incontinence). How is this diagnosed? This condition may be diagnosed based on: Your medical history. A physical exam. Tests, such as: Urine tests. X-rays of your kidney and bladder. Ultrasound. CT scan. Cystoscopy. In this procedure, a health care provider inserts a tube with a light and camera (cystoscope) through the urethra and into the bladder to check for problems. Urodynamic testing. These tests assess how well the bladder, urethra, and sphincter can store and release urine. There are different types of urodynamic tests, and they vary depending on what the test is measuring. To help diagnose your condition, your health care provider may recommend that you keep a log of when you urinate and how much you urinate. How is this treated? Treatment for this condition depends on the type of incontinence that you have and its cause. Treatment may include: Lifestyle changes, such as: Quitting smoking. Maintaining a healthy weight. Staying active. Try to get 150 minutes of moderate-intensity exercise every week. Ask your health care provider which activities are safe for you. Eating a healthy diet. Avoid high-fat foods, like fried foods. Avoid refined carbohydrates like white bread and white rice. Limit how much alcohol and caffeine you drink. Increase your fiber intake. Healthy sources of fiber include beans, whole grains, and fresh fruits and vegetables. Behavioral changes, such as: Pelvic floor muscle exercises. Bladder training, such as lengthening the amount of time between bathroom breaks, or using the bathroom at regular intervals. Using techniques to suppress bladder urges. This can include distraction techniques or controlled breathing  exercises. Medicines, such  as: Medicines to relax the bladder muscles and prevent bladder spasms. Medicines to help slow or prevent the growth of a man's prostate. Botox injections. These can help relax the bladder muscles. Treatments, such as: Using pulses of electricity to help change bladder reflexes (electrical nerve stimulation). For women, using a medical device to prevent urine leaks. This is a small, tampon-like, disposable device that is inserted into the urethra. Injecting collagen or carbon beads (bulking agents) into the urinary sphincter. These can help thicken tissue and close the bladder opening. Surgery. Follow these instructions at home: Lifestyle Limit alcohol and caffeine. These can fill your bladder quickly and irritate it. Keep yourself clean to help prevent odors and skin damage. Ask your health care provider about special skin creams and cleansers that can protect the skin from urine. Consider wearing pads or adult diapers. Make sure to change them regularly, and always change them right after experiencing incontinence. General instructions Take over-the-counter and prescription medicines only as told by your health care provider. Use the bathroom about every 3-4 hours, even if you do not feel the need to urinate. Try to empty your bladder completely every time. After urinating, wait a minute. Then try to urinate again. Make sure you are in a relaxed position while urinating. If your incontinence is caused by nerve problems, keep a log of the medicines you take and the times you go to the bathroom. Keep all follow-up visits. This is important. Where to find more information [employer] of Diabetes and Digestive and Kidney Diseases: [employer] American U[employer] Association: www.urologyhealth.org Contact a health care provider if: You have pain that gets worse. Your incontinence gets worse. Get help right away if: You have a fever or chills. You are  unable to urinate. You have redness in your groin area or down your legs. Summary Urinary incontinence refers to a condition in which a person is unable to control where and when to pass urine. This condition may be caused by medicines, infection, weak bladder muscles, weak pelvic floor muscles, enlargement of the prostate (in men), or surgery. Factors such as older age, obesity, pregnancy and childbirth, menopause, neurological diseases, and chronic coughing may increase your risk for developing this condition. Types of urinary incontinence include urge incontinence, stress incontinence, overflow incontinence, and functional incontinence. This condition is usually treated first with lifestyle and behavioral changes, such as quitting smoking, eating a healthier diet, and doing regular pelvic floor exercises. Other treatment options include medicines, bulking agents, medical devices, electrical nerve stimulation, or surgery. This information is not intended to replace advice given to you by your health care provider. Make sure you discuss any questions you have with your health care provider. Document Revised: 06/26/2020 Document Reviewed: 06/26/2020 Elsevier Patient Education  [place]. Kegel Exercises Kegel exercises can help strengthen your pelvic floor muscles. The pelvic floor is a group of muscles that support your rectum, small intestine, and bladder. In females, pelvic floor muscles also help support the uterus. These muscles help you control the flow of urine and stool (feces). Kegel exercises are painless and simple. They do not require any equipment. Your provider may suggest Kegel exercises to: Improve bladder and bowel control. Improve sexual response. Improve weak pelvic floor muscles after surgery to remove the uterus (hysterectomy) or after pregnancy, in females. Improve weak pelvic floor muscles after prostate gland removal or surgery, in males. Kegel exercises involve  squeezing your pelvic floor muscles. These are the same muscles you squeeze when you try  to stop the flow of urine or keep from passing gas. The exercises can be done while sitting, standing, or lying down, but it is best to vary your position. Ask your health care provider which exercises are safe for you. Do exercises exactly as told by your health care provider and adjust them as directed. Do not begin these exercises until told by your health care provider. Exercises How to do Kegel exercises: Squeeze your pelvic floor muscles tight. You should feel a tight lift in your rectal area. If you are a female, you should also feel a tightness in your vaginal area. Keep your stomach, buttocks, and legs relaxed. Hold the muscles tight for up to 10 seconds. Breathe normally. Relax your muscles for up to 10 seconds. Repeat as told by your health care provider. Repeat this exercise daily as told by your health care provider. Continue to do this exercise for at least 4-6 weeks, or for as long as told by your health care provider. You may be referred to a physical therapist who can help you learn more about how to do Kegel exercises. Depending on your condition, your health care provider may recommend: Varying how long you squeeze your muscles. Doing several sets of exercises every day. Doing exercises for several weeks. Making Kegel exercises a part of your regular exercise routine. This information is not intended to replace advice given to you by your health care provider. Make sure you discuss any questions you have with your health care provider. Document Revised: 04/01/2021 Document Reviewed: 04/01/2021 Elsevier Patient Education  2022 [employer].

## 2021-10-27 NOTE — Progress Notes (Signed)
75 y.o. [id number] Married White or Caucasian Not Hispanic or Latino female here for uterine prolapse.  She is having some urinary incontinence.   She first noticed a lump at the opening of her vagina in the shower back in 8/22. Since then she has started to notice a bulge at the opening of her vagina by the end of the day. She is standing all day, works as a Emergency planning/management officer. She notices almost every day. It isn't painful or uncomfortable. On occasion it is bothersome. It bother's her more emotionally than physically.  She has some mixed incontinence, urge > stress. She voids frequently to try and prevent leakage. Can leak small to large amounts. Wears a large pad just in case. Leaking ~1-2 x a week. Just occasional GSI, small amounts.  She drinks 1-2 cups of coffee a day (up to 20 oz a day at most). She has to change positions to empty her bladder. Thinks she is emptying.  No issues with BM's. She has a BM every day, sometimes 2 x a day. Not straining.   She is having vulvar irritation from the pads. No vaginal irritation, only irritated where the skin contacts the pad.    H/O 2 FT vaginal deliveries, one was breach, the other was a forceps delivery.   She was sexually active, not sexually active since she noticed the prolapse. She did have some dryness.   No LMP recorded. Patient is postmenopausal.          Sexually active: Yes.    The current method of family planning is post menopausal status.    Exercising: Yes.     Walking   yard work  Smoker:  no  Health Maintenance: Pap:  unsure  History of abnormal Pap:  no MMG:  03/12/20 density B Bi-rads 1 neg  BMD:   08/03/17 low bone density  Is receiving prolia injection with a dx of osteoporosis.   Colonoscopy: 04/22/2019 polyps TDaP:  08/19/20 Gardasil: none    reports that she has never smoked. She has never used smokeless tobacco. She reports that she does not drink alcohol and does not use drugs.  Past Medical History:  Diagnosis Date    Adenomatous polyp of colon 2004   Allergic rhinitis    Anemia    Anxiety    Back pain    Cataract 2018   Diverticulosis    GERD (gastroesophageal reflux disease)    Glaucoma    Hypertension    Lumbar compression fracture ([place]) 09/07/2016   x 2    Menopause    Urinary incontinence    Vitamin D deficiency     Past Surgical History:  Procedure Laterality Date   CATARACT EXTRACTION, BILATERAL  04/06/2017   with cypass stent implanted   COLONOSCOPY     cypess stent left eye Left 2018   POLYPECTOMY    Polyp removed from her colon.   Current Outpatient Medications  Medication Sig Dispense Refill   acetaminophen (TYLENOL) 500 MG tablet Take 1,000 mg by mouth every 6 (six) hours as needed for mild pain.     ALPRAZolam (XANAX) 0.25 MG tablet TAKE 1 TABLET(0.25 MG) BY MOUTH TWICE DAILY AS NEEDED FOR ANXIETY 60 tablet 3   amLODipine (NORVASC) 2.5 MG tablet Take 1 tablet (2.5 mg total) by mouth daily. 90 tablet 3   clobetasol cream (TEMOVATE) 9.39 % Apply 1 application topically daily as needed (dermatosis). Reported on 05/23/2016     dorzolamide (TRUSOPT) 2 % ophthalmic solution  INT 1 GTT IN OD TID     dorzolamide-timolol (COSOPT) 22.3-6.8 MG/ML ophthalmic solution 1 drop 2 (two) times daily.     etodolac (LODINE) 500 MG tablet Take 500 mg by mouth as needed.      fexofenadine (ALLEGRA) 180 MG tablet Take 180 mg by mouth daily.     ipratropium (ATROVENT) 0.06 % nasal spray Place 2 sprays into the nose 3 (three) times daily. 15 mL 3   irbesartan (AVAPRO) 150 MG tablet TAKE 1 TABLET(150 MG) BY MOUTH DAILY 90 tablet 3   IRON PO Take 65 mg by mouth daily.     latanoprost (XALATAN) 0.005 % ophthalmic solution INT 1 GTT IN OU HS     methocarbamol (ROBAXIN) 500 MG tablet Take 1 tablet (500 mg total) by mouth every 8 (eight) hours as needed for muscle spasms. 60 tablet 1   pantoprazole (PROTONIX) 40 MG tablet TAKE 1 TABLET(40 MG) BY MOUTH TWICE DAILY 180 tablet 3   timolol (BETIMOL) 0.25 %  ophthalmic solution Place 1 drop into the right eye daily.      timolol (TIMOPTIC) 0.5 % ophthalmic solution INT 1 GTT IN OU BID     VITAMIN D PO Take 1,000 Units by mouth daily.     No current facility-administered medications for this visit.    Family History  Problem Relation Age of Onset   Stroke Mother 72   Hypertension Mother    Sudden death Mother    Anxiety disorder Mother    Lung cancer Father 58   Colon cancer Neg Hx    Esophageal cancer Neg Hx    Stomach cancer Neg Hx    Rectal cancer Neg Hx     Review of Systems  All other systems reviewed and are negative.  Exam:   BP 140/82   Pulse (!) 45   Ht 5\' 3"  (1.6 m)   Wt 198 lb (89.8 kg)   SpO2 90%   BMI 35.07 kg/m   Weight change: @WEIGHTCHANGE @ Height:   Height: 5\' 3"  (160 cm)  Ht Readings from Last 3 Encounters:  10/27/21 5\' 3"  (1.6 m)  09/01/21 5\' 3"  (1.6 m)  07/07/21 5\' 3"  (1.6 m)    General appearance: alert, cooperative and appears stated age   Pelvic: External genitalia:  no lesions              Urethra:  normal appearing urethra with no masses, tenderness or lesions              Bartholins and Skenes: normal                 Vagina: well estrogenized appearing vagina with normal color and discharge, no lesions. Grade 1 cystocele and uterine prolapse. Examined supine and standing with and without valsalva (currently 10 am)              Cervix: no lesions               Bimanual Exam:  Uterus:   no masses or tenderness              Adnexa: no mass, fullness, tenderness                Gae Dry chaperoned for the exam.  1. Female genital prolapse, unspecified type Discussed prolapse, answered all of her questions She needs to come back at the end of a busy day so I can see the full effect of her prolapse Discussed possible  use of pessary She will pay attention to how much she is aware of the prolapse if she isn't in the shower or bathroom  2. Mixed incontinence, urge>>stress Recent normal  urinalysis  Recommended she cut back on caffeine Discussed the option of PT and medication Bladder training information given  3. Chronic vulvitis C/w contact dermatitis Discussed vulvar skin care, information given - betamethasone valerate ointment (VALISONE) 0.1 %; Apply 1 application topically 2 (two) times daily.  Dispense: 30 g; Refill: 0  ~40 minutes spent in total patient care.

## 2021-11-06 NOTE — Telephone Encounter (Signed)
Last Prolia inj 10/20/21 Next Prolia inj due 04/20/22

## 2021-12-13 ENCOUNTER — Emergency Department (INDEPENDENT_AMBULATORY_CARE_PROVIDER_SITE_OTHER)
Admission: RE | Admit: 2021-12-13 | Discharge: 2021-12-13 | Disposition: A | Discharge status: Home / Self Care | Payer: Medicare Other | Source: Ambulatory Visit

## 2021-12-13 ENCOUNTER — Other Ambulatory Visit: Payer: Self-pay

## 2021-12-13 VITALS — BP 179/130 | HR 86 | Temp 98.0°F | Resp 18 | Ht 63.0 in | Wt 193.8 lb

## 2021-12-13 DIAGNOSIS — J0100 Acute maxillary sinusitis, unspecified: Secondary | ICD-10-CM

## 2021-12-13 LAB — POC SARS CORONAVIRUS 2 AG -  ED: SARS Coronavirus 2 Ag: NEGATIVE

## 2021-12-13 MED ORDER — AMOXICILLIN-POT CLAVULANATE 875-125 MG PO TABS: 1.0000 | ORAL_TABLET | Freq: Two times a day (BID) | ORAL | 0 refills | Status: AC

## 2021-12-13 NOTE — Discharge Instructions (Addendum)
Advised patient to hold medication for the next 4 to 5 days prior to starting as she has only had symptoms for 2 days.  Advised patient once starting medication please take with food to completion.  Encouraged patient to increase daily water intake while taking this medication.

## 2021-12-13 NOTE — ED Triage Notes (Signed)
Pt states that she has a cough, head congestion and nasal congestion. X2 days  Pt states that she is vaccinated for covid.  Pt states that she has had flu vaccine.

## 2021-12-13 NOTE — ED Provider Notes (Signed)
[facility]    CSN: [id number] Arrival date & time: 12/13/21  0945      History   Chief Complaint Chief Complaint  Patient presents with   Cough    Cough, head congestion and nasal congestion x2 days    HPI Diane Sawyer is a 76 y.o. female.   HPI 76 year old female presents with cough, head congestion and nasal congestion x2 days.  PMH significant for HTN, anemia, and cataract.  Past Medical History:  Diagnosis Date   Adenomatous polyp of colon 2004   Allergic rhinitis    Anemia    Anxiety    Back pain    Cataract 2018   Diverticulosis    GERD (gastroesophageal reflux disease)    Glaucoma    Hypertension    Lumbar compression fracture ([place]) 09/07/2016   x 2    Menopause    Urinary incontinence    Vitamin D deficiency     Patient Active Problem List   Diagnosis Date Noted   Uterine prolapse 07/07/2021   Edema 04/20/2020   Cough 01/13/2020   History of COVID-19 12/25/2019   Syncope and collapse 12/25/2019   Dysuria 04/25/2019   Insulin resistance 01/14/2019   Prediabetes 01/08/2018   Other fatigue 12/25/2017   Shortness of breath on exertion 12/25/2017   Hyperglycemia 12/25/2017   Class 1 obesity with serious comorbidity and body mass index (BMI) of 32.0 to 32.9 in adult 12/25/2017   Low back pain 11/21/2016   Rash and nonspecific skin eruption 11/10/2014   Left shoulder pain 11/18/2013   Nonspecific abnormal electrocardiogram (ECG) (EKG) 11/18/2013   Dizziness 11/18/2013   Well adult exam 11/12/2012   Anxiety 11/12/2012   Hypertension 11/07/2011   Insomnia 11/07/2011   PHARYNGITIS 08/31/2009   Vitamin D deficiency 09/01/2008   ANEMIA-IRON DEFICIENCY 09/01/2008   Allergic rhinitis 09/01/2008   GERD 09/01/2008   ADENOMATOUS COLONIC POLYP 12/05/2007   HEMORRHOIDS, INTERNAL 12/05/2007   Hiatal hernia 12/05/2007   DIVERTICULOSIS, COLON 12/05/2007   Osteopenia 12/05/2007    Past Surgical History:  Procedure Laterality Date   CATARACT  EXTRACTION, BILATERAL  04/06/2017   with cypass stent implanted   COLONOSCOPY     cypess stent left eye Left 2018   POLYPECTOMY      OB History     Gravida  2   Para  2   Term  2   Preterm      AB      Living  2      SAB      IAB      Ectopic      Multiple      Live Births  2            Home Medications    Prior to Admission medications   Medication Sig Start Date End Date Taking? Authorizing Provider  acetaminophen (TYLENOL) 500 MG tablet Take 1,000 mg by mouth every 6 (six) hours as needed for mild pain.   Yes [provider]  ALPRAZolam (XANAX) 0.25 MG tablet TAKE 1 TABLET(0.25 MG) BY MOUTH TWICE DAILY AS NEEDED FOR ANXIETY 08/24/21  Yes Plotnikov, Evie Lacks, MD  amLODipine (NORVASC) 2.5 MG tablet Take 1 tablet (2.5 mg total) by mouth daily. 09/01/21  Yes Plotnikov, Evie Lacks, MD  amoxicillin-clavulanate (AUGMENTIN) 875-125 MG tablet Take 1 tablet by mouth every 12 (twelve) hours for 7 days. 12/13/21 12/20/21 Yes Eliezer Lofts, FNP  betamethasone valerate ointment (VALISONE) 0.1 % Apply 1 application  topically 2 (two) times daily. 10/27/21  Yes Salvadore Dom, MD  clobetasol cream (TEMOVATE) 5.95 % Apply 1 application topically daily as needed (dermatosis). Reported on 05/23/2016 02/16/15  Yes [provider]  dorzolamide (TRUSOPT) 2 % ophthalmic solution INT 1 GTT IN OD TID 06/03/19  Yes [provider]  dorzolamide-timolol (COSOPT) 22.3-6.8 MG/ML ophthalmic solution 1 drop 2 (two) times daily. 01/25/21  Yes [provider]  etodolac (LODINE) 500 MG tablet Take 500 mg by mouth as needed.    Yes [provider]  fexofenadine (ALLEGRA) 180 MG tablet Take 180 mg by mouth daily.   Yes [provider]  ipratropium (ATROVENT) 0.06 % nasal spray Place 2 sprays into the nose 3 (three) times daily. 09/01/21 09/01/22 Yes Plotnikov, Evie Lacks, MD  irbesartan (AVAPRO) 150 MG tablet TAKE 1 TABLET(150 MG) BY MOUTH DAILY  09/01/21  Yes Plotnikov, Evie Lacks, MD  IRON PO Take 65 mg by mouth daily.   Yes [provider]  latanoprost (XALATAN) 0.005 % ophthalmic solution INT 1 GTT IN OU HS 06/04/19  Yes [provider]  methocarbamol (ROBAXIN) 500 MG tablet Take 1 tablet (500 mg total) by mouth every 8 (eight) hours as needed for muscle spasms. 08/19/20  Yes Plotnikov, Evie Lacks, MD  pantoprazole (PROTONIX) 40 MG tablet TAKE 1 TABLET(40 MG) BY MOUTH TWICE DAILY 07/12/21  Yes Plotnikov, Evie Lacks, MD  timolol (BETIMOL) 0.25 % ophthalmic solution Place 1 drop into the right eye daily.    Yes [provider]  timolol (TIMOPTIC) 0.5 % ophthalmic solution INT 1 GTT IN OU BID 06/27/19  Yes [provider]  VITAMIN D PO Take 1,000 Units by mouth daily.   Yes [provider]    Family History Family History  Problem Relation Age of Onset   Stroke Mother 13   Hypertension Mother    Sudden death Mother    Anxiety disorder Mother    Lung cancer Father 27   Colon cancer Neg Hx    Esophageal cancer Neg Hx    Stomach cancer Neg Hx    Rectal cancer Neg Hx     Social History Social History   Tobacco Use   Smoking status: Never   Smokeless tobacco: Never  Vaping Use   Vaping Use: Never used  Substance Use Topics   Alcohol use: No   Drug use: No     Allergies   Benazepril hcl, Levofloxacin, and Mobic [meloxicam]   Review of Systems Review of Systems  HENT:  Positive for congestion.   Respiratory:  Positive for cough.   All other systems reviewed and are negative.   Physical Exam Triage Vital Signs ED Triage Vitals  Enc Vitals Group     BP 12/13/21 1024 (!) 179/130     Pulse Rate 12/13/21 1024 86     Resp 12/13/21 1024 18     Temp 12/13/21 1024 98 F (36.7 C)     Temp Source 12/13/21 1024 Oral     SpO2 12/13/21 1024 97 %     Weight 12/13/21 1021 193 lb 12.8 oz (87.9 kg)     Height 12/13/21 1021 5\' 3"  (1.6 m)     Head Circumference --      Peak Flow --       Pain Score 12/13/21 1021 0     Pain Loc --      Pain Edu? --      Excl. in [place]? --  No data found.  Updated Vital Signs BP (!) 179/130 (BP Location: Right Arm)    Pulse 86    Temp 98 F (36.7 C) (Oral)    Resp 18    Ht 5\' 3"  (1.6 m)    Wt 193 lb 12.8 oz (87.9 kg)    SpO2 97%    BMI 34.33 kg/m   Physical Exam Vitals and nursing note reviewed.  Constitutional:      General: She is not in acute distress.    Appearance: Normal appearance. She is obese. She is not ill-appearing.  HENT:     Head: Normocephalic and atraumatic.     Right Ear: Tympanic membrane and external ear normal.     Left Ear: Tympanic membrane and external ear normal.     Ears:     Comments: Mild eustachian tube dysfunction noted bilaterally    Nose:     Comments: Turbinates are nonerythematous, nonedematous    Mouth/Throat:     Mouth: Mucous membranes are moist.     Pharynx: Oropharynx is clear.     Comments: Mild amount of clear drainage of posterior oropharynx noted Eyes:     Extraocular Movements: Extraocular movements intact.     Conjunctiva/sclera: Conjunctivae normal.     Pupils: Pupils are equal, round, and reactive to light.  Cardiovascular:     Rate and Rhythm: Normal rate and regular rhythm.     Pulses: Normal pulses.     Heart sounds: Normal heart sounds.  Pulmonary:     Effort: Pulmonary effort is normal.     Breath sounds: Normal breath sounds.  Musculoskeletal:     Cervical back: Normal range of motion and neck supple. No tenderness.  Lymphadenopathy:     Cervical: No cervical adenopathy.  Skin:    General: Skin is warm and dry.  Neurological:     General: No focal deficit present.     Mental Status: She is alert and oriented to person, place, and time.     UC Treatments / Results  Labs (all labs ordered are listed, but only abnormal results are displayed) Labs Reviewed  POC SARS CORONAVIRUS 2 AG -  ED - Normal    EKG   Radiology No results found.  Procedures Procedures  (including critical care time)  Medications Ordered in UC Medications - No data to display  Initial Impression / Assessment and Plan / UC Course  I have reviewed the triage vital signs and the nursing notes.  Pertinent labs & imaging results that were available during my care of the patient were reviewed by me and considered in my medical decision making (see chart for details).     MDM: 1.  Subacute maxillary sinusitis-Rx'd Augmentin. Advised patient to hold medication for the next 4 to 5 days prior to starting as she has only had symptoms for 2 days.  Advised patient once starting medication please take with food to completion.  Encouraged patient to increase daily water intake while taking this medication.  Discharged home, hemodynamically stable. Final Clinical Impressions(s) / UC Diagnoses   Final diagnoses:  Subacute maxillary sinusitis     Discharge Instructions      Advised patient to hold medication for the next 4 to 5 days prior to starting as she has only had symptoms for 2 days.  Advised patient once starting medication please take with food to completion.  Encouraged patient to increase daily water intake while taking this medication.     ED Prescriptions  Medication Sig Dispense Auth. Provider   amoxicillin-clavulanate (AUGMENTIN) 875-125 MG tablet Take 1 tablet by mouth every 12 (twelve) hours for 7 days. 14 tablet Eliezer Lofts, FNP      PDMP not reviewed this encounter.   Eliezer Lofts, [place] 12/13/21 1135

## 2021-12-16 DIAGNOSIS — U071 COVID-19: Secondary | ICD-10-CM | POA: Diagnosis not present

## 2022-01-03 ENCOUNTER — Other Ambulatory Visit: Payer: Self-pay | Admitting: Internal Medicine

## 2022-01-17 DIAGNOSIS — H401122 Primary open-angle glaucoma, left eye, moderate stage: Secondary | ICD-10-CM | POA: Diagnosis not present

## 2022-01-17 DIAGNOSIS — H2511 Age-related nuclear cataract, right eye: Secondary | ICD-10-CM | POA: Diagnosis not present

## 2022-01-17 DIAGNOSIS — H35373 Puckering of macula, bilateral: Secondary | ICD-10-CM | POA: Diagnosis not present

## 2022-01-17 DIAGNOSIS — H401111 Primary open-angle glaucoma, right eye, mild stage: Secondary | ICD-10-CM | POA: Diagnosis not present

## 2022-01-17 DIAGNOSIS — Z961 Presence of intraocular lens: Secondary | ICD-10-CM | POA: Diagnosis not present

## 2022-01-17 DIAGNOSIS — H26492 Other secondary cataract, left eye: Secondary | ICD-10-CM | POA: Diagnosis not present

## 2022-01-31 ENCOUNTER — Other Ambulatory Visit: Payer: Self-pay | Admitting: Internal Medicine

## 2022-02-28 ENCOUNTER — Other Ambulatory Visit: Payer: Self-pay

## 2022-02-28 ENCOUNTER — Encounter: Payer: Self-pay | Admitting: Internal Medicine

## 2022-02-28 ENCOUNTER — Ambulatory Visit (INDEPENDENT_AMBULATORY_CARE_PROVIDER_SITE_OTHER): Payer: Medicare Other | Admitting: Internal Medicine

## 2022-02-28 DIAGNOSIS — R7303 Prediabetes: Secondary | ICD-10-CM

## 2022-02-28 DIAGNOSIS — I10 Essential (primary) hypertension: Secondary | ICD-10-CM | POA: Diagnosis not present

## 2022-02-28 DIAGNOSIS — F419 Anxiety disorder, unspecified: Secondary | ICD-10-CM

## 2022-02-28 DIAGNOSIS — M545 Low back pain, unspecified: Secondary | ICD-10-CM

## 2022-02-28 LAB — COMPREHENSIVE METABOLIC PANEL
ALT: 9 U/L (ref 0–35)
AST: 16 U/L (ref 0–37)
Albumin: 4.2 g/dL (ref 3.5–5.2)
Alkaline Phosphatase: 48 U/L (ref 39–117)
BUN: 16 mg/dL (ref 6–23)
CO2: 30 mEq/L (ref 19–32)
Calcium: 9 mg/dL (ref 8.4–10.5)
Chloride: 105 mEq/L (ref 96–112)
Creatinine, Ser: 0.86 mg/dL (ref 0.40–1.20)
GFR: 66.44 mL/min (ref >60.00)
Glucose, Bld: 91 mg/dL (ref 70–99)
Potassium: 4.4 mEq/L (ref 3.5–5.1)
Sodium: 139 mEq/L (ref 135–145)
Total Bilirubin: 0.5 mg/dL (ref 0.2–1.2)
Total Protein: 6.9 g/dL (ref 6.0–8.3)

## 2022-02-28 NOTE — Assessment & Plan Note (Signed)
Doing well ?Cont on Robaxin prn ?

## 2022-02-28 NOTE — Progress Notes (Signed)
? ?Subjective:  ?Patient ID: Diane Sawyer, female    DOB: [birth year]  Age: 76 y.o. MRN: [id number] ? ?CC: No chief complaint on file. ? ? ?HPI ?Diane Sawyer presents for anxiety, LBP, HTN, GERD ? ?Outpatient Medications Prior to Visit  ?Medication Sig Dispense Refill  ? acetaminophen (TYLENOL) 500 MG tablet Take 1,000 mg by mouth every 6 (six) hours as needed for mild pain.    ? ALPRAZolam (XANAX) 0.25 MG tablet TAKE 1 TABLET(0.25 MG) BY MOUTH TWICE DAILY AS NEEDED FOR ANXIETY 60 tablet 2  ? amLODipine (NORVASC) 2.5 MG tablet Take 1 tablet (2.5 mg total) by mouth daily. 90 tablet 3  ? betamethasone valerate ointment (VALISONE) 0.1 % Apply 1 application topically 2 (two) times daily. 30 g 0  ? clobetasol cream (TEMOVATE) 5.17 % Apply 1 application topically daily as needed (dermatosis). Reported on 05/23/2016    ? dorzolamide (TRUSOPT) 2 % ophthalmic solution INT 1 GTT IN OD TID    ? dorzolamide-timolol (COSOPT) 22.3-6.8 MG/ML ophthalmic solution 1 drop 2 (two) times daily.    ? etodolac (LODINE) 500 MG tablet Take 500 mg by mouth as needed.     ? fexofenadine (ALLEGRA) 180 MG tablet Take 180 mg by mouth daily.    ? ipratropium (ATROVENT) 0.06 % nasal spray USE 2 SPRAYS IN EACH NOSTRIL THREE TIMES DAILY 15 mL 3  ? irbesartan (AVAPRO) 150 MG tablet TAKE 1 TABLET(150 MG) BY MOUTH DAILY 90 tablet 3  ? IRON PO Take 65 mg by mouth daily.    ? latanoprost (XALATAN) 0.005 % ophthalmic solution INT 1 GTT IN OU HS    ? methocarbamol (ROBAXIN) 500 MG tablet Take 1 tablet (500 mg total) by mouth every 8 (eight) hours as needed for muscle spasms. 60 tablet 1  ? pantoprazole (PROTONIX) 40 MG tablet TAKE 1 TABLET(40 MG) BY MOUTH TWICE DAILY 180 tablet 3  ? timolol (BETIMOL) 0.25 % ophthalmic solution Place 1 drop into the right eye daily.     ? timolol (TIMOPTIC) 0.5 % ophthalmic solution INT 1 GTT IN OU BID    ? VITAMIN D PO Take 1,000 Units by mouth daily.    ? ?No facility-administered medications prior to visit.   ? ? ?ROS: ?Review of Systems  ?Constitutional:  Negative for activity change, appetite change, chills, fatigue and unexpected weight change.  ?HENT:  Negative for congestion, mouth sores and sinus pressure.   ?Eyes:  Negative for visual disturbance.  ?Respiratory:  Negative for cough and chest tightness.   ?Gastrointestinal:  Negative for abdominal pain and nausea.  ?Genitourinary:  Negative for difficulty urinating, frequency and vaginal pain.  ?Musculoskeletal:  Positive for back pain. Negative for gait problem.  ?Skin:  Negative for pallor and rash.  ?Neurological:  Negative for dizziness, tremors, weakness, numbness and headaches.  ?Psychiatric/Behavioral:  Negative for confusion, sleep disturbance and suicidal ideas. The patient is nervous/anxious.   ? ?Objective:  ?BP 118/86 (BP Location: Left Arm, Patient Position: Sitting, Cuff Size: Large)   Pulse 73   Temp 98.5 ?F (36.9 ?C) (Oral)   Ht '5\' 3"'$  (1.6 m)   Wt 197 lb (89.4 kg)   SpO2 93%   BMI 34.90 kg/m?  ? ?BP Readings from Last 3 Encounters:  ?02/28/22 118/86  ?12/13/21 (!) 179/130  ?10/27/21 140/82  ? ? ?Wt Readings from Last 3 Encounters:  ?02/28/22 197 lb (89.4 kg)  ?12/13/21 193 lb 12.8 oz (87.9 kg)  ?10/27/21 198 lb (89.8 kg)  ? ? ?  Physical Exam ?Constitutional:   ?   General: She is not in acute distress. ?   Appearance: She is well-developed. She is obese.  ?HENT:  ?   Head: Normocephalic.  ?   Right Ear: External ear normal.  ?   Left Ear: External ear normal.  ?   Nose: Nose normal.  ?Eyes:  ?   General:     ?   Right eye: No discharge.     ?   Left eye: No discharge.  ?   Conjunctiva/sclera: Conjunctivae normal.  ?   Pupils: Pupils are equal, round, and reactive to light.  ?Neck:  ?   Thyroid: No thyromegaly.  ?   Vascular: No JVD.  ?   Trachea: No tracheal deviation.  ?Cardiovascular:  ?   Rate and Rhythm: Normal rate and regular rhythm.  ?   Heart sounds: Normal heart sounds.  ?Pulmonary:  ?   Effort: No respiratory distress.  ?   Breath  sounds: No stridor. No wheezing.  ?Abdominal:  ?   General: Bowel sounds are normal. There is no distension.  ?   Palpations: Abdomen is soft. There is no mass.  ?   Tenderness: There is no abdominal tenderness. There is no guarding or rebound.  ?Musculoskeletal:     ?   General: No tenderness.  ?   Cervical back: Normal range of motion and neck supple. No rigidity.  ?Lymphadenopathy:  ?   Cervical: No cervical adenopathy.  ?Skin: ?   Findings: No erythema or rash.  ?Neurological:  ?   Cranial Nerves: No cranial nerve deficit.  ?   Motor: No abnormal muscle tone.  ?   Coordination: Coordination normal.  ?   Deep Tendon Reflexes: Reflexes normal.  ?Psychiatric:     ?   Behavior: Behavior normal.     ?   Thought Content: Thought content normal.     ?   Judgment: Judgment normal.  ? ? ?Lab Results  ?Component Value Date  ? WBC 5.3 08/30/2021  ? HGB 13.4 08/30/2021  ? HCT 40.6 08/30/2021  ? PLT 198.0 08/30/2021  ? GLUCOSE 86 08/30/2021  ? CHOL 166 08/30/2021  ? TRIG 85.0 08/30/2021  ? HDL 58.00 08/30/2021  ? [place] 91 08/30/2021  ? ALT 7 08/30/2021  ? AST 12 08/30/2021  ? NA 142 08/30/2021  ? K 4.4 08/30/2021  ? CL 106 08/30/2021  ? CREATININE 0.83 08/30/2021  ? BUN 14 08/30/2021  ? CO2 29 08/30/2021  ? TSH 0.91 08/30/2021  ? HGBA1C 5.7 10/21/2019  ? ? ?No results found. ? ?Assessment & Plan:  ? ?Problem List Items Addressed This Visit   ? ? Hypertension  ?  Cont on Amlodipine 2.5 mg/d and Irbesartan 150 mg/d ?  ?  ? Anxiety  ?  Chronic. Continue on Xanax prn ? Potential benefits of a long term benzodiazepines  use as well as potential risks  and complications were explained to the patient and were aknowledged. ?  ?  ? Low back pain  ?  Doing well ?Cont on Robaxin prn ?  ?  ? Prediabetes  ?  Check A1c ?  ?  ? Relevant Orders  ? Comprehensive metabolic panel  ?  ? ? ?No orders of the defined types were placed in this encounter. ?  ? ? ?Follow-up: Return in about 6 months (around 08/31/2022) for Wellness Exam. ? ?Walker Kehr, MD ?

## 2022-02-28 NOTE — Assessment & Plan Note (Signed)
Chronic. Continue on Xanax prn ? Potential benefits of a long term benzodiazepines  use as well as potential risks  and complications were explained to the patient and were aknowledged. ?

## 2022-02-28 NOTE — Assessment & Plan Note (Signed)
Cont on Amlodipine 2.5 mg/d and Irbesartan 150 mg/d ?

## 2022-02-28 NOTE — Assessment & Plan Note (Signed)
Check A1c. 

## 2022-03-07 NOTE — Telephone Encounter (Signed)
Prolia VOB initiated via [email or web address] ? ?Last OV: 02/28/22 ?Next OV:  ?Last Prolia inj: 10/20/21 ?Next Prolia inj DUE: 04/20/22 ? ?

## 2022-03-15 ENCOUNTER — Other Ambulatory Visit: Payer: Self-pay | Admitting: Internal Medicine

## 2022-03-26 NOTE — Telephone Encounter (Signed)
Pt ready for scheduling on or after 04/20/22 ? ?Out-of-pocket cost due at time of visit: $0 ? ?Primary: Medicare ?Prolia co-insurance: 20% (approximately $276) ?Admin fee co-insurance: 20% (approximately $25) ? ?Secondary: [employer] [place] Medicare Supp ?Prolia co-insurance: Covers Medicare Part B co-insurance ?Admin fee co-insurance: Covers Medicare Part B co-insurance ? ?Deductible:  Covered by secondary ? ?Prior Auth: not required ?PA# ?Valid:  ? ?** This summary of benefits is an estimation of the patient's out-of-pocket cost. Exact cost may vary based on individual plan coverage.  ? ?

## 2022-04-27 ENCOUNTER — Other Ambulatory Visit: Payer: Self-pay | Admitting: Internal Medicine

## 2022-05-18 ENCOUNTER — Ambulatory Visit: Payer: Medicare Other

## 2022-05-23 DIAGNOSIS — H401122 Primary open-angle glaucoma, left eye, moderate stage: Secondary | ICD-10-CM | POA: Diagnosis not present

## 2022-05-23 DIAGNOSIS — Z961 Presence of intraocular lens: Secondary | ICD-10-CM | POA: Diagnosis not present

## 2022-05-23 DIAGNOSIS — H26492 Other secondary cataract, left eye: Secondary | ICD-10-CM | POA: Diagnosis not present

## 2022-05-23 DIAGNOSIS — H2511 Age-related nuclear cataract, right eye: Secondary | ICD-10-CM | POA: Diagnosis not present

## 2022-05-23 DIAGNOSIS — H401111 Primary open-angle glaucoma, right eye, mild stage: Secondary | ICD-10-CM | POA: Diagnosis not present

## 2022-05-23 DIAGNOSIS — H35373 Puckering of macula, bilateral: Secondary | ICD-10-CM | POA: Diagnosis not present

## 2022-05-25 ENCOUNTER — Ambulatory Visit (INDEPENDENT_AMBULATORY_CARE_PROVIDER_SITE_OTHER): Payer: Medicare Other | Admitting: *Deleted

## 2022-05-25 DIAGNOSIS — M810 Age-related osteoporosis without current pathological fracture: Secondary | ICD-10-CM | POA: Diagnosis not present

## 2022-05-25 MED ORDER — DENOSUMAB 60 MG/ML ~~LOC~~ SOSY
60.0000 mg | PREFILLED_SYRINGE | Freq: Once | SUBCUTANEOUS | Status: AC
  Administered 2022-05-25: 60 mg via SUBCUTANEOUS

## 2022-05-25 NOTE — Progress Notes (Addendum)
Pls cosign for prolia inj.Marland KitchenJohny Sawyer  Medical screening examination/treatment/procedure(s) were performed by non-physician practitioner and as supervising physician I was immediately available for consultation/collaboration.  I agree with above. Lew Dawes, MD

## 2022-06-27 ENCOUNTER — Ambulatory Visit (INDEPENDENT_AMBULATORY_CARE_PROVIDER_SITE_OTHER): Payer: Medicare Other

## 2022-06-27 VITALS — Wt 197.0 lb

## 2022-06-27 DIAGNOSIS — Z0000 Encounter for general adult medical examination without abnormal findings: Secondary | ICD-10-CM | POA: Diagnosis not present

## 2022-06-27 NOTE — Progress Notes (Addendum)
Virtual Visit via Telephone Note  I connected with  Diane Sawyer on 06/27/22 at 11:00 AM EDT by telephone and verified that I am speaking with the correct person using two identifiers.  Location: Patient: home Provider: Ether Griffins Persons participating in the virtual visit: [place]   I discussed the limitations, risks, security and privacy concerns of performing an evaluation and management service by telephone and the availability of in person appointments. The patient expressed understanding and agreed to proceed.  Interactive audio and video telecommunications were attempted between this nurse and patient, however failed, due to patient having technical difficulties OR patient did not have access to video capability.  We continued and completed visit with audio only.  Some vital signs may be absent or patient reported.   Dionisio David, LPN  Subjective:   Diane Sawyer is a 76 y.o. female who presents for Medicare Annual (Subsequent) preventive examination.  Review of Systems     Cardiac Risk Factors include: advanced age (>37mn, >>60women);family history of premature cardiovascular disease;hypertension     Objective:    There were no vitals filed for this visit. There is no height or weight on file to calculate BMI.     06/27/2022   11:05 AM 06/17/2021    4:00 PM 06/17/2020    3:23 PM 12/31/2018    9:28 AM 10/01/2016    5:23 AM  Advanced Directives  Does Patient Have a Medical Advance Directive? No No No No No  Does patient want to make changes to medical advance directive?    Yes (ED - Information included in AVS)   Would patient like information on creating a medical advance directive? No - Patient declined No - Patient declined No - Patient declined  No - patient declined information    Current Medications (verified) Outpatient Encounter Medications as of 06/27/2022  Medication Sig   acetaminophen (TYLENOL) 500 MG tablet Take 1,000 mg by  mouth every 6 (six) hours as needed for mild pain.   ALPRAZolam (XANAX) 0.25 MG tablet TAKE 1 TABLET(0.25 MG) BY MOUTH TWICE DAILY AS NEEDED FOR ANXIETY   amLODipine (NORVASC) 2.5 MG tablet Take 1 tablet (2.5 mg total) by mouth daily.   betamethasone valerate ointment (VALISONE) 0.1 % Apply 1 application topically 2 (two) times daily.   clobetasol cream (TEMOVATE) 05.57% Apply 1 application topically daily as needed (dermatosis). Reported on 05/23/2016   dorzolamide (TRUSOPT) 2 % ophthalmic solution INT 1 GTT IN OD TID   dorzolamide-timolol (COSOPT) 22.3-6.8 MG/ML ophthalmic solution 1 drop 2 (two) times daily.   fexofenadine (ALLEGRA) 180 MG tablet Take 180 mg by mouth daily.   ipratropium (ATROVENT) 0.06 % nasal spray USE 2 SPRAYS IN EACH NOSTRIL THREE TIMES DAILY   irbesartan (AVAPRO) 150 MG tablet TAKE 1 TABLET(150 MG) BY MOUTH DAILY   IRON PO Take 65 mg by mouth daily.   latanoprost (XALATAN) 0.005 % ophthalmic solution INT 1 GTT IN OU HS   methocarbamol (ROBAXIN) 500 MG tablet Take 1 tablet (500 mg total) by mouth every 8 (eight) hours as needed for muscle spasms.   pantoprazole (PROTONIX) 40 MG tablet TAKE 1 TABLET(40 MG) BY MOUTH TWICE DAILY   timolol (BETIMOL) 0.25 % ophthalmic solution Place 1 drop into the right eye daily.    timolol (TIMOPTIC) 0.5 % ophthalmic solution INT 1 GTT IN OU BID   etodolac (LODINE) 500 MG tablet Take 500 mg by mouth as needed.  (Patient not taking:  Reported on 06/27/2022)   VITAMIN D PO Take 1,000 Units by mouth daily.   No facility-administered encounter medications on file as of 06/27/2022.    Allergies (verified) Benazepril hcl, Levofloxacin, and Mobic [meloxicam]   History: Past Medical History:  Diagnosis Date   Adenomatous polyp of colon 2004   Allergic rhinitis    Anemia    Anxiety    Back pain    Cataract 2018   Diverticulosis    GERD (gastroesophageal reflux disease)    Glaucoma    Hypertension    Lumbar compression fracture ([place])  09/07/2016   x 2    Menopause    Urinary incontinence    Vitamin D deficiency    Past Surgical History:  Procedure Laterality Date   CATARACT EXTRACTION, BILATERAL  04/06/2017   with cypass stent implanted   COLONOSCOPY     cypess stent left eye Left 2018   POLYPECTOMY     Family History  Problem Relation Age of Onset   Stroke Mother 16   Hypertension Mother    Sudden death Mother    Anxiety disorder Mother    Lung cancer Father 2   Colon cancer Neg Hx    Esophageal cancer Neg Hx    Stomach cancer Neg Hx    Rectal cancer Neg Hx    Social History   Socioeconomic History   Marital status: Married    Spouse name: Deberah Pelton" Walby   Number of children: 2   Years of education: Not on file   Highest education level: Not on file  Occupational History   Occupation: Hair dresser  Tobacco Use   Smoking status: Never   Smokeless tobacco: Never  Vaping Use   Vaping Use: Never used  Substance and Sexual Activity   Alcohol use: No   Drug use: No   Sexual activity: Yes    Birth control/protection: Post-menopausal  Other Topics Concern   Not on file  Social History Narrative   Not on file   Social Determinants of Health   Financial Resource Strain: Low Risk  (06/27/2022)   Overall Financial Resource Strain (CARDIA)    Difficulty of Paying Living Expenses: Not hard at all  Food Insecurity: No Food Insecurity (06/27/2022)   Hunger Vital Sign    Worried About Running Out of Food in the Last Year: Never true    [place] in the Last Year: Never true  Transportation Needs: Unknown (06/27/2022)   PRAPARE - Hydrologist (Medical): No    Lack of Transportation (Non-Medical): Not on file  Physical Activity: Sufficiently Active (06/27/2022)   Exercise Vital Sign    Days of Exercise per Week: 5 days    Minutes of Exercise per Session: 60 min  Stress: No Stress Concern Present (06/27/2022)   [place]    Feeling of Stress : Not at all  Social Connections: Moderately Integrated (06/27/2022)   Social Connection and Isolation Panel [NHANES]    Frequency of Communication with Friends and Family: More than three times a week    Frequency of Social Gatherings with Friends and Family: Three times a week    Attends Religious Services: More than 4 times per year    Active Member of Clubs or Organizations: No    Attends Archivist Meetings: Never    Marital Status: Married    Tobacco Counseling Counseling given: Not Answered   Clinical  Intake:  Pre-visit preparation completed: Yes  Pain : No/denies pain     Nutritional Risks: None Diabetes: No  How often do you need to have someone help you when you read instructions, pamphlets, or other written materials from your doctor or pharmacy?: 1 - Never  Diabetic?no  Interpreter Needed?: No  Information entered by :: Kirke Shaggy, LPN   Activities of Daily Living    06/27/2022   11:05 AM  In your present state of health, do you have any difficulty performing the following activities:  Hearing? 0  Vision? 0  Difficulty concentrating or making decisions? 0  Walking or climbing stairs? 0  Dressing or bathing? 0  Doing errands, shopping? 0  Preparing Food and eating ? N  Using the Toilet? N  In the past six months, have you accidently leaked urine? N  Do you have problems with loss of bowel control? N  Managing your Medications? N  Managing your Finances? N  Housekeeping or managing your Housekeeping? N    Patient Care Team: Plotnikov, Evie Lacks, MD as PCP - General Ladene Artist, MD as Attending Physician (Gastroenterology) Warden Fillers, MD as Consulting Physician (Ophthalmology) Salvadore Dom, MD as Consulting Physician (Obstetrics and Gynecology)  Indicate any recent Medical Services you may have received from other than [facility] providers in the past year (date may  be approximate).     Assessment:   This is a routine wellness examination for Diane Sawyer.  Hearing/Vision screen Hearing Screening - Comments:: No aids Vision Screening - Comments:: Readers- Dr.Groat  Dietary issues and exercise activities discussed: Current Exercise Habits: Home exercise routine, Type of exercise: walking, Time (Minutes): 60, Frequency (Times/Week): 5, Weekly Exercise (Minutes/Week): 300, Intensity: Mild   Goals Addressed             This Visit's Progress    DIET - EAT MORE FRUITS AND VEGETABLES         Depression Screen    06/27/2022   11:00 AM 06/17/2021    4:13 PM 06/17/2020    3:24 PM 04/20/2020   11:13 AM 12/31/2018    9:37 AM 12/25/2017    8:49 AM 12/11/2017    8:27 AM  PHQ 2/9 Scores  PHQ - 2 Score 0 0 0 0 0 1 0  PHQ- 9 Score 0     5     Fall Risk    06/27/2022   11:05 AM 06/17/2021    4:13 PM 06/17/2020    3:24 PM 04/20/2020   11:14 AM 12/31/2018    9:37 AM  [place] in the past year? 0 0 0 1 0  Number falls in past yr: 0 0 0 0 0  Injury with Fall? 0 0 0 0   Risk for fall due to : No Fall Risks No Fall Risks No Fall Risks    Follow up Falls evaluation completed Falls evaluation completed Falls evaluation completed;Education provided Falls evaluation completed     [place]:  Any stairs in or around the home? Yes  If so, are there any without handrails? No  Home free of loose throw rugs in walkways, pet beds, electrical cords, etc? Yes  Adequate lighting in your home to reduce risk of falls? Yes   ASSISTIVE DEVICES UTILIZED TO PREVENT FALLS:  Life alert? No  Use of a cane, walker or w/c? No  Grab bars in the bathroom? Yes  Shower chair or bench in shower?  Yes  Elevated toilet seat or a handicapped toilet? No   Cognitive Function:        06/27/2022   11:06 AM  6CIT Screen  What Year? 0 points  What month? 0 points  What time? 0 points  Count back from 20 0 points  Months in reverse 0 points   Repeat phrase 0 points  Total Score 0 points    Immunizations Immunization History  Administered Date(s) Administered   Fluad Quad(high Dose 65+) 10/21/2019, 08/19/2020, 09/01/2021   Influenza Whole 09/01/2008, 08/31/2009, 09/27/2010   Influenza, High Dose Seasonal PF 09/18/2013, 11/21/2016, 10/17/2017, 12/31/2018   Influenza, Seasonal, Injecte, Preservative Fre 11/12/2012   Influenza,inj,Quad PF,6+ Mos 11/10/2014, 11/16/2015   PFIZER(Purple Top)SARS-COV-2 Vaccination 03/16/2020, 04/08/2020, 10/17/2020   Pneumococcal Conjugate-13 11/10/2014   Pneumococcal Polysaccharide-23 11/12/2012   Td 05/05/2010   Tdap 08/19/2020   Zoster, Live 12/21/2009    TDAP status: Up to date  Flu Vaccine status: Up to date  Pneumococcal vaccine status: Up to date  Covid-19 vaccine status: Completed vaccines  Qualifies for Shingles Vaccine? Yes   Zostavax completed Yes   Shingrix Completed?: No.    Education has been provided regarding the importance of this vaccine. Patient has been advised to call insurance company to determine out of pocket expense if they have not yet received this vaccine. Advised may also receive vaccine at local pharmacy or Health Dept. Verbalized acceptance and understanding.  Screening Tests Health Maintenance  Topic Date Due   Hepatitis C Screening  Never done   Zoster Vaccines- Shingrix (1 of 2) Never done   COVID-19 Vaccine (4 - Pfizer series) 12/12/2020   MAMMOGRAM  03/12/2022   COLONOSCOPY (Pts 45-19yr Insurance coverage will need to be confirmed)  04/21/2022   INFLUENZA VACCINE  07/05/2022   TETANUS/TDAP  08/19/2030   Pneumonia Vaccine 75 Years old  Completed   DEXA SCAN  Completed   HPV VACCINES  Aged Out    Health Maintenance  Health Maintenance Due  Topic Date Due   Hepatitis C Screening  Never done   Zoster Vaccines- Shingrix (1 of 2) Never done   COVID-19 Vaccine (4 - [place]) 12/12/2020   MAMMOGRAM  03/12/2022   COLONOSCOPY (Pts 45-43yr Insurance coverage will need to be confirmed)  04/21/2022    Colorectal cancer screening: Type of screening: Colonoscopy. Completed 04/22/19. Repeat every 3 years- has appt on 08/01/22  Mammogram status: Completed 03/12/20. Repeat every year  Bone Density status: Completed 07/31/17. Results reflect: Bone density results: OSTEOPOROSIS. Repeat every 2 years.- declined referral  Lung Cancer Screening: (Low Dose CT Chest recommended if Age 75-80ears, 30 pack-year currently smoking OR have quit w/in 15years.) does not qualify.    Additional Screening:  Hepatitis C Screening: does qualify; Completed no  Vision Screening: Recommended annual ophthalmology exams for early detection of glaucoma and other disorders of the eye. Is the patient up to date with their annual eye exam?  Yes  Who is the provider or what is the name of the office in which the patient attends annual eye exams? Dr.Groat If pt is not established with a provider, would they like to be referred to a provider to establish care? No .   Dental Screening: Recommended annual dental exams for proper oral hygiene  Community Resource Referral / Chronic Care Management: CRR required this visit?  No   CCM required this visit?  No      Plan:     I have personally reviewed  and noted the following in the patient's chart:   Medical and social history Use of alcohol, tobacco or illicit drugs  Current medications and supplements including opioid prescriptions.  Functional ability and status Nutritional status Physical activity Advanced directives List of other physicians Hospitalizations, surgeries, and ER visits in previous 12 months Vitals Screenings to include cognitive, depression, and falls Referrals and appointments  In addition, I have reviewed and discussed with patient certain preventive protocols, quality metrics, and best practice recommendations. A written personalized care plan for preventive services as well as  general preventive health recommendations were provided to patient.     Dionisio David, LPN   4/49/2010   Nurse Notes: none    Medical screening examination/treatment/procedure(s) were performed by non-physician practitioner and as supervising physician I was immediately available for consultation/collaboration.  I agree with above. Lew Dawes, MD

## 2022-06-27 NOTE — Patient Instructions (Signed)
Diane Sawyer , Thank you for taking time to come for your Medicare Wellness Visit. I appreciate your ongoing commitment to your health goals. Please review the following plan we discussed and let me know if I can assist you in the future.   Screening recommendations/referrals: Colonoscopy: 04/22/19, has appt 8/28 Mammogram: 03/12/20 Bone Density: declined Recommended yearly ophthalmology/optometry visit for glaucoma screening and checkup Recommended yearly dental visit for hygiene and checkup  Vaccinations: Influenza vaccine: 09/01/21 Pneumococcal vaccine: 11/10/14 Tdap vaccine: 08/19/20 Shingles vaccine: Zostavax 12/21/09   Covid-19:03/16/20, 04/08/20, 10/17/20  Advanced directives: no  Conditions/risks identified: none  Next appointment: Follow up in one year for your annual wellness visit 06/30/23 @ 9 am by phone   Preventive Care 65 Years and Older, Female Preventive care refers to lifestyle choices and visits with your health care provider that can promote health and wellness. What does preventive care include? A yearly physical exam. This is also called an annual well check. Dental exams once or twice a year. Routine eye exams. Ask your health care provider how often you should have your eyes checked. Personal lifestyle choices, including: Daily care of your teeth and gums. Regular physical activity. Eating a healthy diet. Avoiding tobacco and drug use. Limiting alcohol use. Practicing safe sex. Taking low-dose aspirin every day. Taking vitamin and mineral supplements as recommended by your health care provider. What happens during an annual well check? The services and screenings done by your health care provider during your annual well check will depend on your age, overall health, lifestyle risk factors, and family history of disease. Counseling  Your health care provider may ask you questions about your: Alcohol use. Tobacco use. Drug use. Emotional well-being. Home and  relationship well-being. Sexual activity. Eating habits. History of falls. Memory and ability to understand (cognition). Work and work Statistician. Reproductive health. Screening  You may have the following tests or measurements: Height, weight, and BMI. Blood pressure. Lipid and cholesterol levels. These may be checked every 5 years, or more frequently if you are over 99 years old. Skin check. Lung cancer screening. You may have this screening every year starting at age 9 if you have a 30-pack-year history of smoking and currently smoke or have quit within the past 15 years. Fecal occult blood test (FOBT) of the stool. You may have this test every year starting at age 61. Flexible sigmoidoscopy or colonoscopy. You may have a sigmoidoscopy every 5 years or a colonoscopy every 10 years starting at age 51. Hepatitis C blood test. Hepatitis B blood test. Sexually transmitted disease (STD) testing. Diabetes screening. This is done by checking your blood sugar (glucose) after you have not eaten for a while (fasting). You may have this done every 1-3 years. Bone density scan. This is done to screen for osteoporosis. You may have this done starting at age 30. Mammogram. This may be done every 1-2 years. Talk to your health care provider about how often you should have regular mammograms. Talk with your health care provider about your test results, treatment options, and if necessary, the need for more tests. Vaccines  Your health care provider may recommend certain vaccines, such as: Influenza vaccine. This is recommended every year. Tetanus, diphtheria, and acellular pertussis (Tdap, Td) vaccine. You may need a Td booster every 10 years. Zoster vaccine. You may need this after age 39. Pneumococcal 13-valent conjugate (PCV13) vaccine. One dose is recommended after age 70. Pneumococcal polysaccharide (PPSV23) vaccine. One dose is recommended after age 8. Talk  to your health care provider  about which screenings and vaccines you need and how often you need them. This information is not intended to replace advice given to you by your health care provider. Make sure you discuss any questions you have with your health care provider. Document Released: 12/18/2015 Document Revised: 08/10/2016 Document Reviewed: 09/22/2015 Elsevier Interactive Patient Education  2017 [place] Prevention in the Home Falls can cause injuries. They can happen to people of all ages. There are many things you can do to make your home safe and to help prevent falls. What can I do on the outside of my home? Regularly fix the edges of walkways and driveways and fix any cracks. Remove anything that might make you trip as you walk through a door, such as a raised step or threshold. Trim any bushes or trees on the path to your home. Use bright outdoor lighting. Clear any walking paths of anything that might make someone trip, such as rocks or tools. Regularly check to see if handrails are loose or broken. Make sure that both sides of any steps have handrails. Any raised decks and porches should have guardrails on the edges. Have any leaves, snow, or ice cleared regularly. Use sand or salt on walking paths during winter. Clean up any spills in your garage right away. This includes oil or grease spills. What can I do in the bathroom? Use night lights. Install grab bars by the toilet and in the tub and shower. Do not use towel bars as grab bars. Use non-skid mats or decals in the tub or shower. If you need to sit down in the shower, use a plastic, non-slip stool. Keep the floor dry. Clean up any water that spills on the floor as soon as it happens. Remove soap buildup in the tub or shower regularly. Attach bath mats securely with double-sided non-slip rug tape. Do not have throw rugs and other things on the floor that can make you trip. What can I do in the bedroom? Use night lights. Make sure  that you have a light by your bed that is easy to reach. Do not use any sheets or blankets that are too big for your bed. They should not hang down onto the floor. Have a firm chair that has side arms. You can use this for support while you get dressed. Do not have throw rugs and other things on the floor that can make you trip. What can I do in the kitchen? Clean up any spills right away. Avoid walking on wet floors. Keep items that you use a lot in easy-to-reach places. If you need to reach something above you, use a strong step stool that has a grab bar. Keep electrical cords out of the way. Do not use floor polish or wax that makes floors slippery. If you must use wax, use non-skid floor wax. Do not have throw rugs and other things on the floor that can make you trip. What can I do with my stairs? Do not leave any items on the stairs. Make sure that there are handrails on both sides of the stairs and use them. Fix handrails that are broken or loose. Make sure that handrails are as long as the stairways. Check any carpeting to make sure that it is firmly attached to the stairs. Fix any carpet that is loose or worn. Avoid having throw rugs at the top or bottom of the stairs. If you do have throw rugs,  attach them to the floor with carpet tape. Make sure that you have a light switch at the top of the stairs and the bottom of the stairs. If you do not have them, ask someone to add them for you. What else can I do to help prevent falls? Wear shoes that: Do not have high heels. Have rubber bottoms. Are comfortable and fit you well. Are closed at the toe. Do not wear sandals. If you use a stepladder: Make sure that it is fully opened. Do not climb a closed stepladder. Make sure that both sides of the stepladder are locked into place. Ask someone to hold it for you, if possible. Clearly mark and make sure that you can see: Any grab bars or handrails. First and last steps. Where the edge of  each step is. Use tools that help you move around (mobility aids) if they are needed. These include: Canes. Walkers. Scooters. Crutches. Turn on the lights when you go into a dark area. Replace any light bulbs as soon as they burn out. Set up your furniture so you have a clear path. Avoid moving your furniture around. If any of your floors are uneven, fix them. If there are any pets around you, be aware of where they are. Review your medicines with your doctor. Some medicines can make you feel dizzy. This can increase your chance of falling. Ask your doctor what other things that you can do to help prevent falls. This information is not intended to replace advice given to you by your health care provider. Make sure you discuss any questions you have with your health care provider. Document Released: 09/17/2009 Document Revised: 04/28/2016 Document Reviewed: 12/26/2014 Elsevier Interactive Patient Education  2017 [employer].

## 2022-07-05 ENCOUNTER — Other Ambulatory Visit: Payer: Self-pay | Admitting: Internal Medicine

## 2022-07-07 ENCOUNTER — Encounter: Payer: Medicare Other | Admitting: Gastroenterology

## 2022-07-30 ENCOUNTER — Other Ambulatory Visit: Payer: Self-pay | Admitting: Internal Medicine

## 2022-08-01 ENCOUNTER — Encounter: Payer: Self-pay | Admitting: Gastroenterology

## 2022-08-01 ENCOUNTER — Ambulatory Visit (INDEPENDENT_AMBULATORY_CARE_PROVIDER_SITE_OTHER): Payer: Medicare Other | Admitting: Gastroenterology

## 2022-08-01 VITALS — BP 122/64 | HR 91 | Ht 63.0 in | Wt 196.4 lb

## 2022-08-01 DIAGNOSIS — K219 Gastro-esophageal reflux disease without esophagitis: Secondary | ICD-10-CM

## 2022-08-01 DIAGNOSIS — Z86010 Personal history of colonic polyps: Secondary | ICD-10-CM | POA: Diagnosis not present

## 2022-08-01 DIAGNOSIS — K449 Diaphragmatic hernia without obstruction or gangrene: Secondary | ICD-10-CM

## 2022-08-01 MED ORDER — NA SULFATE-K SULFATE-MG SULF 17.5-3.13-1.6 GM/177ML PO SOLN: 1.0000 | Freq: Once | ORAL | 0 refills | Status: AC

## 2022-08-01 NOTE — Patient Instructions (Addendum)
You have been scheduled for a colonoscopy. Please follow written instructions given to you at your visit today.  °Please pick up your prep supplies at the pharmacy within the next 1-3 days. °If you use inhalers (even only as needed), please bring them with you on the day of your procedure. ° °Due to recent changes in healthcare laws, you may see the results of your imaging and laboratory studies on MyChart before your provider has had a chance to review them.  We understand that in some cases there may be results that are confusing or concerning to you. Not all laboratory results come back in the same time frame and the provider may be waiting for multiple results in order to interpret others.  Please give us 48 hours in order for your provider to thoroughly review all the results before contacting the office for clarification of your results.  ° °The [facility] providers would like to encourage you to use MYCHART to communicate with providers for non-urgent requests or questions.  Due to long hold times on the telephone, sending your provider a message by MYCHART may be a faster and more efficient way to get a response.  Please allow 48 business hours for a response.  Please remember that this is for non-urgent requests.  ° °Thank you for choosing me and [facility]. ° °Malcolm T. Stark, Jr., MD., FACG ° °

## 2022-08-01 NOTE — Progress Notes (Signed)
Assessment     Personal history of 3 sessile serrated colon polyps in 2020. Prior history of sessile serrated adenoma and adenomatous colon polyps.  GERD with a large hiatal hernia Moderate left colon diverticulosis Scant rectal bleeding from internal hemorrhoids   Recommendations    Schedule colonoscopy. The risks (including bleeding, perforation, infection, missed lesions, medication reactions and possible hospitalization or surgery if complications occur), benefits, and alternatives to colonoscopy with possible biopsy and possible polypectomy were discussed with the patient and they consent to proceed.   Continue pantoprazole 40 mg p.o. twice daily.  Continue Tums as needed for breakthrough symptoms.  Closely follow antireflux measures. Preparation H suppositories or cream daily as needed. Tucks daily as needed.   HPI    This is a 76 year old female with a personal history of colon polyps and chronic GERD.  Colonoscopy and EGD performed in May 2020 as below.  Her reflux symptoms are generally well controlled.  She occasionally takes Tums or Pepto-Bismol as needed for breakthrough symptoms which are easily controlled.  She had 1 small adenomatous polyp in 2009 and 1 small serrated sessile adenoma in 2014.  She had 3 small sessile serrated polyps in Amay 2020.  She occasionally has a scant amount of red blood on the tissue paper when wiping that she attributes to hemorrhoids.  Colonoscopy May 2020: - Three 6 to 9 mm polyps in the descending colon, in the transverse colon and in the ascending colon, removed with a cold snare. Resected and retrieved. - Moderate diverticulosis in the left colon. There was evidence of diverticular spasm. There was no evidence of diverticular bleeding. - Internal hemorrhoids. - The examination was otherwise normal on direct and retroflexion views. Path: SSPs  EGD May 2020  - Normal esophagus. - Large hiatal hernia. - Three gastric polyps. Resected and  retrieved. - Normal duodenal bulb and second portion of the duodenum. Path: hyperplastic and benign fundic gland polyps  Labs / Imaging       Latest Ref Rng & Units 02/28/2022    9:58 AM 08/30/2021    8:32 AM 10/21/2019   10:52 AM  Hepatic Function  Total Protein 6.0 - 8.3 g/dL 6.9  6.7  7.3   Albumin 3.5 - 5.2 g/dL 4.2  4.2  4.5   AST 0 - 37 U/L $Remo'16  12  13   'LZlJI$ ALT 0 - 35 U/L $Remo'9  7  9   'mYICk$ Alk Phosphatase 39 - 117 U/L 48  45  51   Total Bilirubin 0.2 - 1.2 mg/dL 0.5  0.5  0.5   Bilirubin, Direct 0.0 - 0.3 mg/dL   0.1        Latest Ref Rng & Units 08/30/2021    8:32 AM 10/21/2019   10:52 AM 06/17/2018   12:51 PM  CBC  WBC 4.0 - 10.5 K/uL 5.3  6.4  7.3   Hemoglobin 12.0 - 15.0 g/dL 13.4  14.6  13.2   Hematocrit 36.0 - 46.0 % 40.6  43.8  42.0   Platelets 150.0 - 400.0 K/uL 198.0  223.0  210     Current Medications, Allergies, Past Medical History, Past Surgical History, Family History and Social History were reviewed in [employer] record.   Physical Exam: General: Well developed, well nourished, no acute distress Head: Normocephalic and atraumatic Eyes: Sclerae anicteric, EOMI Ears: Normal auditory acuity Mouth: Not examined Lungs: Clear throughout to auscultation Heart: Regular rate and rhythm; no murmurs, rubs or  bruits Abdomen: Soft, non tender and non distended. No masses, hepatosplenomegaly or hernias noted. Normal Bowel sounds Rectal: Deferred to colonoscopy  Musculoskeletal: Symmetrical with no gross deformities  Pulses:  Normal pulses noted Extremities: No clubbing, cyanosis, edema or deformities noted Neurological: Alert oriented x 4, grossly nonfocal Psychological:  Alert and cooperative. Normal mood and affect   Christphor Groft T. Fuller Plan, MD 08/01/2022, 3:43 PM

## 2022-08-04 ENCOUNTER — Ambulatory Visit
Admission: RE | Admit: 2022-08-04 | Discharge: 2022-08-04 | Disposition: A | Discharge status: Home / Self Care | Payer: Medicare Other | Source: Ambulatory Visit | Attending: Urgent Care | Admitting: Urgent Care

## 2022-08-04 ENCOUNTER — Other Ambulatory Visit: Payer: Self-pay

## 2022-08-04 VITALS — BP 169/86 | HR 77 | Temp 97.8°F | Resp 16 | Ht 63.0 in | Wt 195.0 lb

## 2022-08-04 DIAGNOSIS — R350 Frequency of micturition: Secondary | ICD-10-CM | POA: Diagnosis not present

## 2022-08-04 DIAGNOSIS — I10 Essential (primary) hypertension: Secondary | ICD-10-CM

## 2022-08-04 DIAGNOSIS — N8111 Cystocele, midline: Secondary | ICD-10-CM | POA: Diagnosis not present

## 2022-08-04 LAB — POCT URINALYSIS DIP (MANUAL ENTRY)
Blood, UA: NEGATIVE
Ketones, POC UA: NEGATIVE mg/dL
Protein Ur, POC: NEGATIVE mg/dL
Spec Grav, UA: 1.025 (ref 1.010–1.025)
Urobilinogen, UA: 1 E.U./dL
pH, UA: 6.5 (ref 5.0–8.0)

## 2022-08-04 MED ORDER — TAMSULOSIN HCL 0.4 MG PO CAPS: 0.4000 mg | ORAL_CAPSULE | Freq: Every day | ORAL | 0 refills | Status: DC

## 2022-08-04 NOTE — ED Triage Notes (Signed)
Polyuria x 2 days

## 2022-08-04 NOTE — Discharge Instructions (Signed)
Your urine does not show signs of infection. I suspect your symptoms are related to urinary retention and your cystocele. Please follow-up with your gynecologist for further evaluation.   Look into the options of in tone, or a pessary. You have been prescribed tamsulosin today, take 1 daily which should help with urinary retention. We will call with the results of your urine culture should antibiotics be required.

## 2022-08-06 LAB — URINE CULTURE

## 2022-08-06 NOTE — ED Provider Notes (Signed)
[place]    CSN: [id number] Arrival date & time: 08/04/22  1644      History   Chief Complaint Chief Complaint  Patient presents with   Urinary Frequency    Bladder prolapse - Entered by patient   Polyuria    HPI Diane Sawyer is a 76 y.o. female.   Pleasant 76 year old female with a known history of a grade 3-4 cystocele intermittently presents today due to concerns of a possible UTI.  She states she has been urinating more frequently, and having significant hesitancy, worse over the past 2 days.  She denies any dysuria, hematuria, flank pain, fever.  She saw her gynecology/urology specialist in November of last year, but failed to follow-up.  She is not prone to UTIs.  She has not taken anything over-the-counter for her symptoms.  She still works and is standing on her feet for numerous hours daily.  She states that her bladder symptoms are worse in the evening.  She states today, her bladder is not protruding, but it normally does.   Urinary Frequency    Past Medical History:  Diagnosis Date   Adenomatous polyp of colon 2004   Allergic rhinitis    Anemia    Anxiety    Back pain    Cataract 2018   Diverticulosis    GERD (gastroesophageal reflux disease)    Glaucoma    Hypertension    Lumbar compression fracture ([place]) 09/07/2016   x 2    Menopause    Urinary incontinence    Vitamin D deficiency     Patient Active Problem List   Diagnosis Date Noted   Uterine prolapse 07/07/2021   Edema 04/20/2020   Cough 01/13/2020   History of COVID-19 12/25/2019   Syncope and collapse 12/25/2019   Dysuria 04/25/2019   Insulin resistance 01/14/2019   Prediabetes 01/08/2018   Other fatigue 12/25/2017   Shortness of breath on exertion 12/25/2017   Hyperglycemia 12/25/2017   Class 1 obesity with serious comorbidity and body mass index (BMI) of 32.0 to 32.9 in adult 12/25/2017   Low back pain 11/21/2016   Rash and nonspecific skin eruption 11/10/2014   Left  shoulder pain 11/18/2013   Nonspecific abnormal electrocardiogram (ECG) (EKG) 11/18/2013   Dizziness 11/18/2013   Well adult exam 11/12/2012   Anxiety 11/12/2012   Hypertension 11/07/2011   Insomnia 11/07/2011   PHARYNGITIS 08/31/2009   Vitamin D deficiency 09/01/2008   ANEMIA-IRON DEFICIENCY 09/01/2008   Allergic rhinitis 09/01/2008   GERD 09/01/2008   ADENOMATOUS COLONIC POLYP 12/05/2007   HEMORRHOIDS, INTERNAL 12/05/2007   Hiatal hernia 12/05/2007   DIVERTICULOSIS, COLON 12/05/2007   Osteopenia 12/05/2007    Past Surgical History:  Procedure Laterality Date   CATARACT EXTRACTION, BILATERAL  04/06/2017   with cypass stent implanted   COLONOSCOPY     cypess stent left eye Left 2018   POLYPECTOMY      OB History     Gravida  2   Para  2   Term  2   Preterm      AB      Living  2      SAB      IAB      Ectopic      Multiple      Live Births  2            Home Medications    Prior to Admission medications   Medication Sig Start Date End Date Taking? Authorizing  Provider  tamsulosin (FLOMAX) 0.4 MG CAPS capsule Take 1 capsule (0.4 mg total) by mouth daily. 08/04/22  Yes Deeandra Jerry L, PA  acetaminophen (TYLENOL) 500 MG tablet Take 1,000 mg by mouth every 6 (six) hours as needed for mild pain.    [provider]  ALPRAZolam (XANAX) 0.25 MG tablet TAKE 1 TABLET(0.25 MG) BY MOUTH TWICE DAILY AS NEEDED FOR ANXIETY 04/29/22   Plotnikov, Evie Lacks, MD  amLODipine (NORVASC) 2.5 MG tablet Take 1 tablet (2.5 mg total) by mouth daily. 09/01/21   Plotnikov, Evie Lacks, MD  betamethasone valerate ointment (VALISONE) 0.1 % Apply 1 application topically 2 (two) times daily. 10/27/21   Salvadore Dom, MD  dorzolamide (TRUSOPT) 2 % ophthalmic solution INT 1 GTT IN OD TID 06/03/19   [provider]  fexofenadine (ALLEGRA) 180 MG tablet Take 180 mg by mouth daily.    [provider]  ipratropium (ATROVENT) 0.06 % nasal spray Place 2  sprays into both nostrils 3 (three) times daily. Annual appt due in Sept must see provider for future refills 08/01/22   Plotnikov, Evie Lacks, MD  irbesartan (AVAPRO) 150 MG tablet TAKE 1 TABLET(150 MG) BY MOUTH DAILY 09/01/21   Plotnikov, Evie Lacks, MD  IRON PO Take 65 mg by mouth daily.    [provider]  latanoprost (XALATAN) 0.005 % ophthalmic solution INT 1 GTT IN OU HS 06/04/19   [provider]  pantoprazole (PROTONIX) 40 MG tablet TAKE 1 TABLET(40 MG) BY MOUTH TWICE DAILY 07/12/21   Plotnikov, Evie Lacks, MD  timolol (TIMOPTIC) 0.5 % ophthalmic solution INT 1 GTT IN OU BID 06/27/19   [provider]  VITAMIN D PO Take 1,000 Units by mouth daily.    [provider]    Family History Family History  Problem Relation Age of Onset   Stroke Mother 38   Hypertension Mother    Sudden death Mother    Anxiety disorder Mother    Lung cancer Father 22   Colon cancer Neg Hx    Esophageal cancer Neg Hx    Stomach cancer Neg Hx    Rectal cancer Neg Hx     Social History Social History   Tobacco Use   Smoking status: Never   Smokeless tobacco: Never  Vaping Use   Vaping Use: Never used  Substance Use Topics   Alcohol use: No   Drug use: No     Allergies   Benazepril hcl, Levofloxacin, and Mobic [meloxicam]   Review of Systems Review of Systems  Genitourinary:  Positive for frequency.  As per HPI   Physical Exam Triage Vital Signs ED Triage Vitals  Enc Vitals Group     BP 08/04/22 1654 (!) 169/86     Pulse Rate 08/04/22 1654 77     Resp 08/04/22 1654 16     Temp 08/04/22 1654 97.8 F (36.6 C)     Temp Source 08/04/22 1654 Oral     SpO2 08/04/22 1654 100 %     Weight 08/04/22 1656 195 lb (88.5 kg)     Height 08/04/22 1656 '5\' 3"'$  (1.6 m)     Head Circumference --      Peak Flow --      Pain Score 08/04/22 1655 3     Pain Loc --      Pain Edu? --      Excl. in [place]? --    No data found.  Updated Vital Signs BP (!) 169/86 (BP  Location: Left Arm)   Pulse 77   Temp 97.8 F (36.6 C) (Oral)   Resp 16   Ht '5\' 3"'$  (1.6 m)   Wt 195 lb (88.5 kg)   SpO2 100%   BMI 34.54 kg/m   Visual Acuity Right Eye Distance:   Left Eye Distance:   Bilateral Distance:    Right Eye Near:   Left Eye Near:    Bilateral Near:     Physical Exam Vitals and nursing note reviewed. Chaperone present: declined.  Constitutional:      General: She is not in acute distress.    Appearance: Normal appearance. She is obese. She is not ill-appearing, toxic-appearing or diaphoretic.  HENT:     Head: Normocephalic and atraumatic.  Eyes:     Pupils: Pupils are equal, round, and reactive to light.  Cardiovascular:     Rate and Rhythm: Normal rate.     Pulses: Normal pulses.  Pulmonary:     Effort: Pulmonary effort is normal. No respiratory distress.     Breath sounds: Normal breath sounds. No wheezing.  Abdominal:     General: Abdomen is flat. Bowel sounds are normal. There is no distension.     Palpations: Abdomen is soft. There is no mass.     Tenderness: There is no abdominal tenderness. There is no right CVA tenderness, left CVA tenderness, guarding or rebound.     Hernia: No hernia is present.  Genitourinary:    Labia:        Right: No rash, tenderness, lesion or injury.        Left: No rash, tenderness, lesion or injury.      Urethra: No prolapse, urethral pain, urethral swelling or urethral lesion.     Comments: Grade 2 cystocele noted.  No other abnormalities noted Musculoskeletal:     Right lower leg: No edema.     Left lower leg: No edema.  Lymphadenopathy:     Lower Body: No right inguinal adenopathy. No left inguinal adenopathy.  Skin:    General: Skin is warm.     Findings: No bruising, erythema or rash.  Neurological:     General: No focal deficit present.     Mental Status: She is alert. Mental status is at baseline.  Psychiatric:        Mood and Affect: Mood normal.      UC Treatments / Results   Labs (all labs ordered are listed, but only abnormal results are displayed) Labs Reviewed  POCT URINALYSIS DIP (MANUAL ENTRY) - Abnormal; Notable for the following components:      Result Value   Color, UA other (*)    Leukocytes, UA Trace (*)    All other components within normal limits  URINE CULTURE    EKG   Radiology No results found.  Procedures Procedures (including critical care time)  Medications Ordered in UC Medications - No data to display  Initial Impression / Assessment and Plan / UC Course  I have reviewed the triage vital signs and the nursing notes.  Pertinent labs & imaging results that were available during my care of the patient were reviewed by me and considered in my medical decision making (see chart for details).     Urinary frequency -suspect urinary frequency secondary to cystocele/urinary retention.  UA dip in office does not suggest infectious causes at this time.  We will do trial of tamsulosin. Cystocele -grade 2 upon exam at present time.  Patient to follow-up with her  gynecology urology specialist, recommended discussing pessary urine time.  Urine culture sent out to ensure no infectious causes HTN -patient is on prescription medication for blood pressure.  Encouraged to monitor at home, follow-up with PCP if it remains elevated.  Final Clinical Impressions(s) / UC Diagnoses   Final diagnoses:  Urinary frequency  Cystocele, midline     Discharge Instructions      Your urine does not show signs of infection. I suspect your symptoms are related to urinary retention and your cystocele. Please follow-up with your gynecologist for further evaluation.   Look into the options of in tone, or a pessary. You have been prescribed tamsulosin today, take 1 daily which should help with urinary retention. We will call with the results of your urine culture should antibiotics be required.   ED Prescriptions     Medication Sig Dispense Auth.  Provider   tamsulosin (FLOMAX) 0.4 MG CAPS capsule Take 1 capsule (0.4 mg total) by mouth daily. 30 capsule Lilton Pare L, [place]      PDMP not reviewed this encounter.   Chaney Malling, [place] 08/06/22 1057

## 2022-08-13 ENCOUNTER — Other Ambulatory Visit: Payer: Self-pay | Admitting: Internal Medicine

## 2022-08-17 ENCOUNTER — Encounter: Payer: Self-pay | Admitting: Obstetrics and Gynecology

## 2022-08-17 ENCOUNTER — Ambulatory Visit (INDEPENDENT_AMBULATORY_CARE_PROVIDER_SITE_OTHER): Payer: Medicare Other | Admitting: Obstetrics and Gynecology

## 2022-08-17 VITALS — BP 130/82 | HR 77 | Wt 197.0 lb

## 2022-08-17 DIAGNOSIS — N8111 Cystocele, midline: Secondary | ICD-10-CM

## 2022-08-17 DIAGNOSIS — N3090 Cystitis, unspecified without hematuria: Secondary | ICD-10-CM | POA: Diagnosis not present

## 2022-08-17 DIAGNOSIS — N3941 Urge incontinence: Secondary | ICD-10-CM

## 2022-08-17 DIAGNOSIS — R339 Retention of urine, unspecified: Secondary | ICD-10-CM

## 2022-08-17 LAB — URINALYSIS, COMPLETE
Bilirubin Urine: NEGATIVE
Glucose, UA: NEGATIVE
Hyaline Cast: NONE SEEN /LPF
Ketones, ur: NEGATIVE
Nitrite: NEGATIVE
Specific Gravity, Urine: 1.025 (ref 1.001–1.035)
pH: 5.5 (ref 5.0–8.0)

## 2022-08-17 MED ORDER — SULFAMETHOXAZOLE-TRIMETHOPRIM 800-160 MG PO TABS: 1.0000 | ORAL_TABLET | Freq: Two times a day (BID) | ORAL | 0 refills | Status: DC

## 2022-08-17 NOTE — Patient Instructions (Signed)
Urinary Tract Infection, Adult  A urinary tract infection (UTI) is an infection of any part of the urinary tract. The urinary tract includes the kidneys, ureters, bladder, and urethra. These organs make, store, and get rid of urine in the body. An upper UTI affects the ureters and kidneys. A lower UTI affects the bladder and urethra. What are the causes? Most urinary tract infections are caused by bacteria in your genital area around your urethra, where urine leaves your body. These bacteria grow and cause inflammation of your urinary tract. What increases the risk? You are more likely to develop this condition if: You have a urinary catheter that stays in place. You are not able to control when you urinate or have a bowel movement (incontinence). You are female and you: Use a spermicide or diaphragm for birth control. Have low estrogen levels. Are pregnant. You have certain genes that increase your risk. You are sexually active. You take antibiotic medicines. You have a condition that causes your flow of urine to slow down, such as: An enlarged prostate, if you are female. Blockage in your urethra. A kidney stone. A nerve condition that affects your bladder control (neurogenic bladder). Not getting enough to drink, or not urinating often. You have certain medical conditions, such as: Diabetes. A weak disease-fighting system (immunesystem). Sickle cell disease. Gout. Spinal cord injury. What are the signs or symptoms? Symptoms of this condition include: Needing to urinate right away (urgency). Frequent urination. This may include small amounts of urine each time you urinate. Pain or burning with urination. Blood in the urine. Urine that smells bad or unusual. Trouble urinating. Cloudy urine. Vaginal discharge, if you are female. Pain in the abdomen or the lower back. You may also have: Vomiting or a decreased appetite. Confusion. Irritability or tiredness. A fever or  chills. Diarrhea. The first symptom in older adults may be confusion. In some cases, they may not have any symptoms until the infection has worsened. How is this diagnosed? This condition is diagnosed based on your medical history and a physical exam. You may also have other tests, including: Urine tests. Blood tests. Tests for STIs (sexually transmitted infections). If you have had more than one UTI, a cystoscopy or imaging studies may be done to determine the cause of the infections. How is this treated? Treatment for this condition includes: Antibiotic medicine. Over-the-counter medicines to treat discomfort. Drinking enough water to stay hydrated. If you have frequent infections or have other conditions such as a kidney stone, you may need to see a health care provider who specializes in the urinary tract (urologist). In rare cases, urinary tract infections can cause sepsis. Sepsis is a life-threatening condition that occurs when the body responds to an infection. Sepsis is treated in the hospital with IV antibiotics, fluids, and other medicines. Follow these instructions at home:  Medicines Take over-the-counter and prescription medicines only as told by your health care provider. If you were prescribed an antibiotic medicine, take it as told by your health care provider. Do not stop using the antibiotic even if you start to feel better. General instructions Make sure you: Empty your bladder often and completely. Do not hold urine for long periods of time. Empty your bladder after sex. Wipe from front to back after urinating or having a bowel movement if you are female. Use each tissue only one time when you wipe. Drink enough fluid to keep your urine pale yellow. Keep all follow-up visits. This is important. Contact a health   care provider if: Your symptoms do not get better after 1-2 days. Your symptoms go away and then return. Get help right away if: You have severe pain in  your back or your lower abdomen. You have a fever or chills. You have nausea or vomiting. Summary A urinary tract infection (UTI) is an infection of any part of the urinary tract, which includes the kidneys, ureters, bladder, and urethra. Most urinary tract infections are caused by bacteria in your genital area. Treatment for this condition often includes antibiotic medicines. If you were prescribed an antibiotic medicine, take it as told by your health care provider. Do not stop using the antibiotic even if you start to feel better. Keep all follow-up visits. This is important. This information is not intended to replace advice given to you by your health care provider. Make sure you discuss any questions you have with your health care provider. Document Revised: 07/03/2020 Document Reviewed: 07/03/2020 Elsevier Patient Education  2023 [employer]  

## 2022-08-17 NOTE — Progress Notes (Addendum)
GYNECOLOGY  VISIT   HPI: 76 y.o.   Married White or Caucasian Not Hispanic or Latino  female   [phone] with No LMP recorded. Patient is postmenopausal.   here for bladder prolapse follow up.  She was seen in 11/22 c/o prolapse. It was a morning appointment and only a grade 2 cystocele was noted.   A couple of weeks ago she was having vaginal pain and urinary frequency. She was noted to have a grade 2 cystocele. Urine culture returned with multiple strains of bacteria, recollection was recommended. She felt her bladder wasn't emptying so she started her on flomax. She is voiding a lot in the morning. Late in the afternoon she can't void. Not sure if she is emptying.   She is incontinent of urine at least one time a day, small to large amounts. Occasional GSI, small amounts. Voids every 2 hours a night since she started the flomax, prior to that it was 2 x a night.   She could not give a urine today.   GYNECOLOGIC HISTORY: No LMP recorded. Patient is postmenopausal. Contraception:pmp  Menopausal hormone therapy: none         OB History     Gravida  2   Para  2   Term  2   Preterm      AB      Living  2      SAB      IAB      Ectopic      Multiple      Live Births  2              Patient Active Problem List   Diagnosis Date Noted   Uterine prolapse 07/07/2021   Edema 04/20/2020   Cough 01/13/2020   History of COVID-19 12/25/2019   Syncope and collapse 12/25/2019   Dysuria 04/25/2019   Insulin resistance 01/14/2019   Prediabetes 01/08/2018   Other fatigue 12/25/2017   Shortness of breath on exertion 12/25/2017   Hyperglycemia 12/25/2017   Class 1 obesity with serious comorbidity and body mass index (BMI) of 32.0 to 32.9 in adult 12/25/2017   Low back pain 11/21/2016   Rash and nonspecific skin eruption 11/10/2014   Left shoulder pain 11/18/2013   Nonspecific abnormal electrocardiogram (ECG) (EKG) 11/18/2013   Dizziness 11/18/2013   Well adult exam  11/12/2012   Anxiety 11/12/2012   Hypertension 11/07/2011   Insomnia 11/07/2011   PHARYNGITIS 08/31/2009   Vitamin D deficiency 09/01/2008   ANEMIA-IRON DEFICIENCY 09/01/2008   Allergic rhinitis 09/01/2008   GERD 09/01/2008   ADENOMATOUS COLONIC POLYP 12/05/2007   HEMORRHOIDS, INTERNAL 12/05/2007   Hiatal hernia 12/05/2007   DIVERTICULOSIS, COLON 12/05/2007   Osteopenia 12/05/2007    Past Medical History:  Diagnosis Date   Adenomatous polyp of colon 2004   Allergic rhinitis    Anemia    Anxiety    Back pain    Cataract 2018   Diverticulosis    GERD (gastroesophageal reflux disease)    Glaucoma    Hypertension    Lumbar compression fracture ([place]) 09/07/2016   x 2    Menopause    Urinary incontinence    Vitamin D deficiency     Past Surgical History:  Procedure Laterality Date   CATARACT EXTRACTION, BILATERAL  04/06/2017   with cypass stent implanted   COLONOSCOPY     cypess stent left eye Left 2018   POLYPECTOMY      Current Outpatient Medications  Medication  Sig Dispense Refill   acetaminophen (TYLENOL) 500 MG tablet Take 1,000 mg by mouth every 6 (six) hours as needed for mild pain.     ALPRAZolam (XANAX) 0.25 MG tablet TAKE 1 TABLET(0.25 MG) BY MOUTH TWICE DAILY AS NEEDED FOR ANXIETY 60 tablet 3   amLODipine (NORVASC) 2.5 MG tablet Take 1 tablet (2.5 mg total) by mouth daily. 90 tablet 3   betamethasone valerate ointment (VALISONE) 0.1 % Apply 1 application topically 2 (two) times daily. 30 g 0   dorzolamide (TRUSOPT) 2 % ophthalmic solution INT 1 GTT IN OD TID     fexofenadine (ALLEGRA) 180 MG tablet Take 180 mg by mouth daily.     ipratropium (ATROVENT) 0.06 % nasal spray USE 2 SPRAYS IN EACH NOSTRIL THREE TIMES DAILY 15 mL 0   irbesartan (AVAPRO) 150 MG tablet TAKE 1 TABLET(150 MG) BY MOUTH DAILY 90 tablet 3   IRON PO Take 65 mg by mouth daily.     latanoprost (XALATAN) 0.005 % ophthalmic solution INT 1 GTT IN OU HS     pantoprazole (PROTONIX) 40 MG tablet  TAKE 1 TABLET(40 MG) BY MOUTH TWICE DAILY 180 tablet 3   tamsulosin (FLOMAX) 0.4 MG CAPS capsule Take 1 capsule (0.4 mg total) by mouth daily. 30 capsule 0   timolol (TIMOPTIC) 0.5 % ophthalmic solution INT 1 GTT IN OU BID     VITAMIN D PO Take 1,000 Units by mouth daily.     No current facility-administered medications for this visit.     ALLERGIES: Benazepril hcl, Levofloxacin, and Mobic [meloxicam]  Family History  Problem Relation Age of Onset   Stroke Mother 28   Hypertension Mother    Sudden death Mother    Anxiety disorder Mother    Lung cancer Father 68   Colon cancer Neg Hx    Esophageal cancer Neg Hx    Stomach cancer Neg Hx    Rectal cancer Neg Hx     Social History   Socioeconomic History   Marital status: Married    Spouse name: Mortimer Fries "[place]" [place]   Number of children: 2   Years of education: Not on file   Highest education level: Not on file  Occupational History   Occupation: Hair dresser  Tobacco Use   Smoking status: Never   Smokeless tobacco: Never  Vaping Use   Vaping Use: Never used  Substance and Sexual Activity   Alcohol use: No   Drug use: No   Sexual activity: Yes    Birth control/protection: Post-menopausal  Other Topics Concern   Not on file  Social History Narrative   Not on file   Social Determinants of Health   Financial Resource Strain: Low Risk  (06/27/2022)   Overall Financial Resource Strain (CARDIA)    Difficulty of Paying Living Expenses: Not hard at all  Food Insecurity: No Food Insecurity (06/27/2022)   Hunger Vital Sign    Worried About Running Out of Food in the Last Year: Never true    [place] in the Last Year: Never true  Transportation Needs: Unknown (06/27/2022)   PRAPARE - Hydrologist (Medical): No    Lack of Transportation (Non-Medical): Not on file  Physical Activity: Sufficiently Active (06/27/2022)   Exercise Vital Sign    Days of Exercise per Week: 5 days    Minutes of  Exercise per Session: 60 min  Stress: No Stress Concern Present (06/27/2022)   [employer] - Occupational Stress Questionnaire    Feeling of Stress : Not at all  Social Connections: Moderately Integrated (06/27/2022)   Social Connection and Isolation Panel [NHANES]    Frequency of Communication with Friends and Family: More than three times a week    Frequency of Social Gatherings with Friends and Family: Three times a week    Attends Religious Services: More than 4 times per year    Active Member of Clubs or Organizations: No    Attends Archivist Meetings: Never    Marital Status: Married  Human resources officer Violence: Not At Risk (06/27/2022)   Humiliation, Afraid, Rape, and Kick questionnaire    Fear of Current or Ex-Partner: No    Emotionally Abused: No    Physically Abused: No    Sexually Abused: No    Review of Systems  All other systems reviewed and are negative.   PHYSICAL EXAMINATION:    BP 130/82   Pulse 77   Wt 197 lb (89.4 kg)   SpO2 100%   BMI 34.90 kg/m     General appearance: alert, cooperative and appears stated age  Pelvic: External genitalia:  no lesions              Urethra:  normal appearing urethra with no masses, tenderness or lesions              Bartholins and Skenes: normal                 Vagina: grade 1 cystocele and grade one               Cervix: no lesions              Bimanual Exam:  Uterus:   no masses or tenderness              Adnexa: no mass, fullness, tenderness               PVR 220 cc  Chaperone was present for exam.  1. Urge incontinence of urine Worsening. - Urinalysis, Complete - Urine Culture  2. Urinary retention Unable to void, PVR 220 cc  3. Cystocele, midline Today only with grade 1 cystocele, she states that it gets worse than this when she is on her feet all day. -Will have her come back at the end of a busy day to reexamine her and possibly try a pessary. -If she continues to  have retention, she will need to f/u with Urology  4. Cystitis - sulfamethoxazole-trimethoprim (BACTRIM DS) 800-160 MG tablet; Take 1 tablet by mouth 2 (two) times daily. One PO BID x 3 days  Dispense: 6 tablet; Refill: 0 -Culture pending  Over 30 minutes in total patient care.   Addendum: A st cath urine was obtained in typical sterile fashion.

## 2022-08-18 LAB — URINE CULTURE
MICRO NUMBER:: 13912002
Result:: NO GROWTH
SPECIMEN QUALITY:: ADEQUATE

## 2022-08-18 NOTE — Progress Notes (Signed)
GYNECOLOGY  VISIT   HPI: 76 y.o.   Married White or Caucasian Not Hispanic or Latino  female   [phone] with No LMP recorded. Patient is postmenopausal.   here for recheck and possible pessary fitting.   She has issues with frequent urination, some urge urinary incontinence, and c/o an intermittent vaginal bulge. She voids well in the am and during the night, not as well as the day goes on.  Both times she has been seen here she has only had a grade 1 cystocele.  She had a PVR of 220 cc on 08/17/22. Urine culture was negative.   She was started on flomax by another provider.   GYNECOLOGIC HISTORY: No LMP recorded. Patient is postmenopausal. Contraception: PM Menopausal hormone therapy: none        OB History     Gravida  2   Para  2   Term  2   Preterm      AB      Living  2      SAB      IAB      Ectopic      Multiple      Live Births  2              Patient Active Problem List   Diagnosis Date Noted   Uterine prolapse 07/07/2021   Edema 04/20/2020   Cough 01/13/2020   History of COVID-19 12/25/2019   Syncope and collapse 12/25/2019   Dysuria 04/25/2019   Insulin resistance 01/14/2019   Prediabetes 01/08/2018   Other fatigue 12/25/2017   Shortness of breath on exertion 12/25/2017   Hyperglycemia 12/25/2017   Class 1 obesity with serious comorbidity and body mass index (BMI) of 32.0 to 32.9 in adult 12/25/2017   Low back pain 11/21/2016   Rash and nonspecific skin eruption 11/10/2014   Left shoulder pain 11/18/2013   Nonspecific abnormal electrocardiogram (ECG) (EKG) 11/18/2013   Dizziness 11/18/2013   Well adult exam 11/12/2012   Anxiety 11/12/2012   Hypertension 11/07/2011   Insomnia 11/07/2011   PHARYNGITIS 08/31/2009   Vitamin D deficiency 09/01/2008   ANEMIA-IRON DEFICIENCY 09/01/2008   Allergic rhinitis 09/01/2008   GERD 09/01/2008   ADENOMATOUS COLONIC POLYP 12/05/2007   HEMORRHOIDS, INTERNAL 12/05/2007   Hiatal hernia 12/05/2007    DIVERTICULOSIS, COLON 12/05/2007   Osteopenia 12/05/2007    Past Medical History:  Diagnosis Date   Adenomatous polyp of colon 2004   Allergic rhinitis    Anemia    Anxiety    Back pain    Cataract 2018   Diverticulosis    GERD (gastroesophageal reflux disease)    Glaucoma    Hypertension    Lumbar compression fracture ([place]) 09/07/2016   x 2    Menopause    Urinary incontinence    Vitamin D deficiency     Past Surgical History:  Procedure Laterality Date   CATARACT EXTRACTION, BILATERAL  04/06/2017   with cypass stent implanted   COLONOSCOPY     cypess stent left eye Left 2018   POLYPECTOMY      Current Outpatient Medications  Medication Sig Dispense Refill   acetaminophen (TYLENOL) 500 MG tablet Take 1,000 mg by mouth every 6 (six) hours as needed for mild pain.     ALPRAZolam (XANAX) 0.25 MG tablet TAKE 1 TABLET(0.25 MG) BY MOUTH TWICE DAILY AS NEEDED FOR ANXIETY 60 tablet 3   amLODipine (NORVASC) 2.5 MG tablet Take 1 tablet (2.5 mg total) by mouth  daily. 90 tablet 3   betamethasone valerate ointment (VALISONE) 0.1 % Apply 1 application topically 2 (two) times daily. 30 g 0   dorzolamide (TRUSOPT) 2 % ophthalmic solution INT 1 GTT IN OD TID     fexofenadine (ALLEGRA) 180 MG tablet Take 180 mg by mouth daily.     ipratropium (ATROVENT) 0.06 % nasal spray USE 2 SPRAYS IN EACH NOSTRIL THREE TIMES DAILY 15 mL 0   irbesartan (AVAPRO) 150 MG tablet TAKE 1 TABLET(150 MG) BY MOUTH DAILY 90 tablet 3   IRON PO Take 65 mg by mouth daily.     latanoprost (XALATAN) 0.005 % ophthalmic solution INT 1 GTT IN OU HS     pantoprazole (PROTONIX) 40 MG tablet TAKE 1 TABLET(40 MG) BY MOUTH TWICE DAILY 180 tablet 3   tamsulosin (FLOMAX) 0.4 MG CAPS capsule Take 1 capsule (0.4 mg total) by mouth daily. 30 capsule 0   timolol (TIMOPTIC) 0.5 % ophthalmic solution INT 1 GTT IN OU BID     VITAMIN D PO Take 1,000 Units by mouth daily.     No current facility-administered medications for this  visit.     ALLERGIES: Benazepril hcl, Levofloxacin, and Mobic [meloxicam]  Family History  Problem Relation Age of Onset   Stroke Mother 29   Hypertension Mother    Sudden death Mother    Anxiety disorder Mother    Lung cancer Father 37   Colon cancer Neg Hx    Esophageal cancer Neg Hx    Stomach cancer Neg Hx    Rectal cancer Neg Hx     Social History   Socioeconomic History   Marital status: Married    Spouse name: Mortimer Fries "[place]" [place]   Number of children: 2   Years of education: Not on file   Highest education level: Not on file  Occupational History   Occupation: Hair dresser  Tobacco Use   Smoking status: Never   Smokeless tobacco: Never  Vaping Use   Vaping Use: Never used  Substance and Sexual Activity   Alcohol use: No   Drug use: No   Sexual activity: Yes    Partners: Male    Birth control/protection: Post-menopausal  Other Topics Concern   Not on file  Social History Narrative   Not on file   Social Determinants of Health   Financial Resource Strain: Low Risk  (06/27/2022)   Overall Financial Resource Strain (CARDIA)    Difficulty of Paying Living Expenses: Not hard at all  Food Insecurity: No Food Insecurity (06/27/2022)   Hunger Vital Sign    Worried About Running Out of Food in the Last Year: Never true    [place] in the Last Year: Never true  Transportation Needs: Unknown (06/27/2022)   PRAPARE - Hydrologist (Medical): No    Lack of Transportation (Non-Medical): Not on file  Physical Activity: Sufficiently Active (06/27/2022)   Exercise Vital Sign    Days of Exercise per Week: 5 days    Minutes of Exercise per Session: 60 min  Stress: No Stress Concern Present (06/27/2022)   [place]    Feeling of Stress : Not at all  Social Connections: Moderately Integrated (06/27/2022)   Social Connection and Isolation Panel [NHANES]    Frequency of  Communication with Friends and Family: More than three times a week    Frequency of Social Gatherings with Friends and Family: Three times  a week    Attends Religious Services: More than 4 times per year    Active Member of Clubs or Organizations: No    Attends Archivist Meetings: Never    Marital Status: Married  Human resources officer Violence: Not At Risk (06/27/2022)   Humiliation, Afraid, Rape, and Kick questionnaire    Fear of Current or Ex-Partner: No    Emotionally Abused: No    Physically Abused: No    Sexually Abused: No    Review of Systems  Constitutional: Negative.   HENT: Negative.    Eyes: Negative.   Respiratory: Negative.    Cardiovascular: Negative.   Gastrointestinal: Negative.   Genitourinary: Negative.   Musculoskeletal: Negative.   Skin: Negative.   Neurological: Negative.   Endo/Heme/Allergies: Negative.   Psychiatric/Behavioral: Negative.      PHYSICAL EXAMINATION:    BP 120/82   Pulse 85   SpO2 97%     General appearance: alert, cooperative and appears stated age   Pelvic: External genitalia:  no lesions              Urethra:  normal appearing urethra with no masses, tenderness or lesions              Bartholins and Skenes: normal                 Vagina: grade 2-3 cystocele with valsalva. Grade 1 uterine prolapse. No significant rectocele.  BM: no masses or tenderness.   PVR 240 cc.  Patient fitted with a #4 ring pessary with support. Comfortable in the office               Chaperone was present for exam.  1. Cystocele, midline Grade 2-3 cystocele. Fitted with #4 ring pessary with support F/U next week  2. Urge incontinence of urine  3. Urinary retention Will repeat PVR next week, want to see if she is emptying better with the pessary in.

## 2022-08-19 ENCOUNTER — Telehealth: Payer: Self-pay | Admitting: *Deleted

## 2022-08-19 NOTE — Telephone Encounter (Signed)
-----   Message from Salvadore Dom, MD sent at 08/19/2022  1:13 PM EDT ----- Her urine culture actually returned negative for infection. If she feels better on the antibiotic, she can finish the last day of it. She can stay on the flomax until her f/u visit. Thanks, Sharee Pimple ----- Message ----- From: Gorden Harms Sent: 08/18/2022  10:52 AM EDT To: Salvadore Dom, MD  He Dr Dewitt Hoes, I talked with Adanya and she is coming in next Friday 9/22 @ 4:00. She asked is she to continue the flomax til she sees you? She did say the antibiotic you gave her is helping :) ----- Message ----- From: Salvadore Dom, MD Sent: 08/17/2022   5:30 PM EDT To: Gorden Harms  Please call this patient and schedule her at 4 pm, sometime in the next week or two (move someone if needed). She needs to f/u for prolapse and possible pessary fitting. Thanks! Let me know if you need help finding a spot. Sharee Pimple

## 2022-08-19 NOTE — Telephone Encounter (Signed)
Patient informed. 

## 2022-08-26 ENCOUNTER — Encounter: Payer: Self-pay | Admitting: Obstetrics and Gynecology

## 2022-08-26 ENCOUNTER — Ambulatory Visit (INDEPENDENT_AMBULATORY_CARE_PROVIDER_SITE_OTHER): Payer: Medicare Other | Admitting: Obstetrics and Gynecology

## 2022-08-26 VITALS — BP 120/82 | HR 85

## 2022-08-26 DIAGNOSIS — N8111 Cystocele, midline: Secondary | ICD-10-CM

## 2022-08-26 DIAGNOSIS — N3941 Urge incontinence: Secondary | ICD-10-CM | POA: Diagnosis not present

## 2022-08-26 DIAGNOSIS — R339 Retention of urine, unspecified: Secondary | ICD-10-CM | POA: Diagnosis not present

## 2022-08-27 ENCOUNTER — Other Ambulatory Visit: Payer: Self-pay | Admitting: Internal Medicine

## 2022-08-30 ENCOUNTER — Encounter: Payer: Self-pay | Admitting: Gastroenterology

## 2022-08-30 ENCOUNTER — Ambulatory Visit (AMBULATORY_SURGERY_CENTER): Payer: Medicare Other | Admitting: Gastroenterology

## 2022-08-30 VITALS — BP 128/73 | HR 76 | Temp 96.2°F | Resp 15 | Ht 63.0 in | Wt 196.0 lb

## 2022-08-30 DIAGNOSIS — Z86010 Personal history of colonic polyps: Secondary | ICD-10-CM

## 2022-08-30 DIAGNOSIS — D123 Benign neoplasm of transverse colon: Secondary | ICD-10-CM | POA: Diagnosis not present

## 2022-08-30 DIAGNOSIS — K635 Polyp of colon: Secondary | ICD-10-CM

## 2022-08-30 DIAGNOSIS — Z09 Encounter for follow-up examination after completed treatment for conditions other than malignant neoplasm: Secondary | ICD-10-CM | POA: Diagnosis not present

## 2022-08-30 MED ORDER — SODIUM CHLORIDE 0.9 % IV SOLN: 500.0000 mL | Freq: Once | INTRAVENOUS | Status: DC

## 2022-08-30 NOTE — Progress Notes (Signed)
Pt's states no medical or surgical changes since previsit or office visit. 

## 2022-08-30 NOTE — Progress Notes (Signed)
Report to pacu rn. Vss. Care resumed by rn. 

## 2022-08-30 NOTE — Patient Instructions (Addendum)
Handouts Provided:  Polyps, High Fiber Diet and Diverticulosis  - Repeat colonoscopy vs no repeat due to age after studies are complete for surveillance based on pathology results. - Patient has a contact number available for emergencies. The signs and symptoms of potential delayed complications were discussed with the patient. Return to normal activities tomorrow. Written discharge instructions were provided to the patient. - High fiber diet. - Continue present medications. - Await pathology results.  YOU HAD AN ENDOSCOPIC PROCEDURE TODAY AT [facility]:   Refer to the procedure report that was given to you for any specific questions about what was found during the examination.  If the procedure report does not answer your questions, please call your gastroenterologist to clarify.  If you requested that your care partner not be given the details of your procedure findings, then the procedure report has been included in a sealed envelope for you to review at your convenience later.  YOU SHOULD EXPECT: Some feelings of bloating in the abdomen. Passage of more gas than usual.  Walking can help get rid of the air that was put into your GI tract during the procedure and reduce the bloating. If you had a lower endoscopy (such as a colonoscopy or flexible sigmoidoscopy) you may notice spotting of blood in your stool or on the toilet paper. If you underwent a bowel prep for your procedure, you may not have a normal bowel movement for a few days.  Please Note:  You might notice some irritation and congestion in your nose or some drainage.  This is from the oxygen used during your procedure.  There is no need for concern and it should clear up in a day or so.  SYMPTOMS TO REPORT IMMEDIATELY:  Following lower endoscopy (colonoscopy or flexible sigmoidoscopy):  Excessive amounts of blood in the stool  Significant tenderness or worsening of abdominal pains  Swelling of the abdomen that is  new, acute  Fever of 100F or higher  For urgent or emergent issues, a gastroenterologist can be reached at any hour by calling [phone]. Do not use MyChart messaging for urgent concerns.    DIET:  We do recommend a small meal at first, but then you may proceed to your regular diet.  Drink plenty of fluids but you should avoid alcoholic beverages for 24 hours.  ACTIVITY:  You should plan to take it easy for the rest of today and you should NOT DRIVE or use heavy machinery until tomorrow (because of the sedation medicines used during the test).    FOLLOW UP: Our staff will call the number listed on your records the next business day following your procedure.  We will call around 7:15- 8:00 am to check on you and address any questions or concerns that you may have regarding the information given to you following your procedure. If we do not reach you, we will leave a message.     If any biopsies were taken you will be contacted by phone or by letter within the next 1-3 weeks.  Please call us at [phone] if you have not heard about the biopsies in 3 weeks.    SIGNATURES/CONFIDENTIALITY: You and/or your care partner have signed paperwork which will be entered into your electronic medical record.  These signatures attest to the fact that that the information above on your After Visit Summary has been reviewed and is understood.  Full responsibility of the confidentiality of this discharge information lies with you and/or  your care-partner.

## 2022-08-30 NOTE — Progress Notes (Signed)
See 08/01/2022 H&P, no changes

## 2022-08-30 NOTE — Progress Notes (Signed)
Called to room to assist during endoscopic procedure.  Patient ID and intended procedure confirmed with present staff. Received instructions for my participation in the procedure from the performing physician.  

## 2022-08-30 NOTE — Op Note (Signed)
[place] Patient Name: Diane Sawyer Procedure Date: 08/30/2022 2:12 PM MRN: [id number] Endoscopist: Ladene Artist , MD Age: 76 Referring MD:  Date of Birth: [birth year] Gender: Female Account #: [id number] Procedure:                Colonoscopy Indications:              High risk colon cancer surveillance: Personal                            history of sessile serrated colon polyp (less than                            10 mm in size) with no dysplasia Medicines:                Monitored Anesthesia Care Procedure:                Pre-Anesthesia Assessment:                           - Prior to the procedure, a History and Physical                            was performed, and patient medications and                            allergies were reviewed. The patient's tolerance of                            previous anesthesia was also reviewed. The risks                            and benefits of the procedure and the sedation                            options and risks were discussed with the patient.                            All questions were answered, and informed consent                            was obtained. Prior Anticoagulants: The patient has                            taken no previous anticoagulant or antiplatelet                            agents. ASA Grade Assessment: II - A patient with                            mild systemic disease. After reviewing the risks                            and benefits, the patient was deemed in  satisfactory condition to undergo the procedure.                           After obtaining informed consent, the colonoscope                            was passed under direct vision. Throughout the                            procedure, the patient's blood pressure, pulse, and                            oxygen saturations were monitored continuously. The                            CF [id number] #[id number] was introduced  through the anus                            and advanced to the the cecum, identified by                            appendiceal orifice and ileocecal valve. The                            ileocecal valve, appendiceal orifice, and rectum                            were photographed. The quality of the bowel                            preparation was good. The colonoscopy was somewhat                            difficult due to significant looping and a tortuous                            colon. The patient tolerated the procedure well. Scope In: 2:17:04 PM Scope Out: 2:37:24 PM Scope Withdrawal Time: 0 hours 14 minutes 36 seconds  Total Procedure Duration: 0 hours 20 minutes 20 seconds  Findings:                 The perianal and digital rectal examinations were                            normal.                           A 3 mm polyp was found in the transverse colon. The                            polyp was sessile. The polyp was removed with a                            cold biopsy forceps. Resection and retrieval were  complete.                           Two sessile polyps were found in the transverse                            colon. The polyps were 7 to 8 mm in size. These                            polyps were removed with a cold snare. Resection                            and retrieval were complete.                           A few small-mouthed diverticula were found in the                            right colon. There was no evidence of diverticular                            bleeding.                           Multiple medium-mouthed diverticula were found in                            the left colon. There was no evidence of                            diverticular bleeding.                           Internal hemorrhoids were found during                            retroflexion. The hemorrhoids were moderate and                            Grade I  (internal hemorrhoids that do not prolapse).                           The exam was otherwise without abnormality on                            direct and retroflexion views. Complications:            No immediate complications. Estimated blood loss:                            None. Estimated Blood Loss:     Estimated blood loss: none. Impression:               - One 3 mm polyp in the transverse colon, removed  with a cold biopsy forceps. Resected and retrieved.                           - Two 7 to 8 mm polyps in the transverse colon,                            removed with a cold snare. Resected and retrieved.                           - Mild diverticulosis in the right colon.                           - Moderate diverticulosis in the left colon.                           - Internal hemorrhoids.                           - The examination was otherwise normal on direct                            and retroflexion views. Recommendation:           - Repeat colonoscopy vs no repeat due to age after                            studies are complete for surveillance based on                            pathology results.                           - Patient has a contact number available for                            emergencies. The signs and symptoms of potential                            delayed complications were discussed with the                            patient. Return to normal activities tomorrow.                            Written discharge instructions were provided to the                            patient.                           - High fiber diet.                           - Continue present medications.                           -  Await pathology results. Ladene Artist, MD 08/30/2022 2:43:52 PM This report has been signed electronically.

## 2022-08-31 ENCOUNTER — Telehealth: Payer: Self-pay | Admitting: *Deleted

## 2022-08-31 ENCOUNTER — Encounter: Payer: Self-pay | Admitting: Obstetrics and Gynecology

## 2022-08-31 ENCOUNTER — Ambulatory Visit (INDEPENDENT_AMBULATORY_CARE_PROVIDER_SITE_OTHER): Payer: Medicare Other | Admitting: Obstetrics and Gynecology

## 2022-08-31 VITALS — BP 108/62 | HR 65 | Wt 195.0 lb

## 2022-08-31 DIAGNOSIS — N8111 Cystocele, midline: Secondary | ICD-10-CM

## 2022-08-31 DIAGNOSIS — N3941 Urge incontinence: Secondary | ICD-10-CM | POA: Diagnosis not present

## 2022-08-31 DIAGNOSIS — R339 Retention of urine, unspecified: Secondary | ICD-10-CM | POA: Diagnosis not present

## 2022-08-31 NOTE — Progress Notes (Unsigned)
GYNECOLOGY  VISIT   HPI: 76 y.o.   Married White or Caucasian Not Hispanic or Latino  female   [phone] with No LMP recorded. Patient is postmenopausal.   here for pessary follow up. She states that she is able to urinate at the end of the day and that she does not feel the bulge.  She was fitted with a #4 ring pessary with support last week. Comfortable, feels she is voiding better.  GYNECOLOGIC HISTORY: No LMP recorded. Patient is postmenopausal. Contraception:pmp  Menopausal hormone therapy: none         OB History     Gravida  2   Para  2   Term  2   Preterm      AB      Living  2      SAB      IAB      Ectopic      Multiple      Live Births  2              Patient Active Problem List   Diagnosis Date Noted   Uterine prolapse 07/07/2021   Edema 04/20/2020   Cough 01/13/2020   History of COVID-19 12/25/2019   Syncope and collapse 12/25/2019   Dysuria 04/25/2019   Insulin resistance 01/14/2019   Prediabetes 01/08/2018   Other fatigue 12/25/2017   Shortness of breath on exertion 12/25/2017   Hyperglycemia 12/25/2017   Class 1 obesity with serious comorbidity and body mass index (BMI) of 32.0 to 32.9 in adult 12/25/2017   Low back pain 11/21/2016   Rash and nonspecific skin eruption 11/10/2014   Left shoulder pain 11/18/2013   Nonspecific abnormal electrocardiogram (ECG) (EKG) 11/18/2013   Dizziness 11/18/2013   Well adult exam 11/12/2012   Anxiety 11/12/2012   Hypertension 11/07/2011   Insomnia 11/07/2011   PHARYNGITIS 08/31/2009   Vitamin D deficiency 09/01/2008   ANEMIA-IRON DEFICIENCY 09/01/2008   Allergic rhinitis 09/01/2008   GERD 09/01/2008   ADENOMATOUS COLONIC POLYP 12/05/2007   HEMORRHOIDS, INTERNAL 12/05/2007   Hiatal hernia 12/05/2007   DIVERTICULOSIS, COLON 12/05/2007   Osteopenia 12/05/2007    Past Medical History:  Diagnosis Date   Adenomatous polyp of colon 2004   Allergic rhinitis    Anemia    Anxiety    Back pain     Cataract 2018   Diverticulosis    GERD (gastroesophageal reflux disease)    Glaucoma    Hypertension    Lumbar compression fracture ([place]) 09/07/2016   x 2    Menopause    Urinary incontinence    Vitamin D deficiency     Past Surgical History:  Procedure Laterality Date   CATARACT EXTRACTION, BILATERAL  04/06/2017   with cypass stent implanted   COLONOSCOPY     cypess stent left eye Left 2018   POLYPECTOMY      Current Outpatient Medications  Medication Sig Dispense Refill   acetaminophen (TYLENOL) 500 MG tablet Take 1,000 mg by mouth every 6 (six) hours as needed for mild pain.     ALPRAZolam (XANAX) 0.25 MG tablet TAKE 1 TABLET(0.25 MG) BY MOUTH TWICE DAILY AS NEEDED FOR ANXIETY 60 tablet 3   amLODipine (NORVASC) 2.5 MG tablet Take 1 tablet (2.5 mg total) by mouth daily. 90 tablet 3   betamethasone valerate ointment (VALISONE) 0.1 % Apply 1 application topically 2 (two) times daily. 30 g 0   dorzolamide-timolol (COSOPT) 22.3-6.8 MG/ML ophthalmic solution 1 drop 2 (two) times daily.  fexofenadine (ALLEGRA) 180 MG tablet Take 180 mg by mouth daily.     ipratropium (ATROVENT) 0.06 % nasal spray Place 2 sprays into the nose 3 (three) times daily. Annual appt due must see provider for future refills 15 mL 0   irbesartan (AVAPRO) 150 MG tablet TAKE 1 TABLET(150 MG) BY MOUTH DAILY 90 tablet 3   IRON PO Take 65 mg by mouth daily.     latanoprost (XALATAN) 0.005 % ophthalmic solution INT 1 GTT IN OU HS     pantoprazole (PROTONIX) 40 MG tablet TAKE 1 TABLET(40 MG) BY MOUTH TWICE DAILY 180 tablet 3   tamsulosin (FLOMAX) 0.4 MG CAPS capsule Take 1 capsule (0.4 mg total) by mouth daily. 30 capsule 0   VITAMIN D PO Take 1,000 Units by mouth daily.     No current facility-administered medications for this visit.     ALLERGIES: Benazepril hcl, Levofloxacin, and Mobic [meloxicam]  Family History  Problem Relation Age of Onset   Stroke Mother 57   Hypertension Mother    Sudden  death Mother    Anxiety disorder Mother    Lung cancer Father 52   Colon cancer Neg Hx    Esophageal cancer Neg Hx    Stomach cancer Neg Hx    Rectal cancer Neg Hx     Social History   Socioeconomic History   Marital status: Married    Spouse name: Mortimer Fries "[place]" [place]   Number of children: 2   Years of education: Not on file   Highest education level: Not on file  Occupational History   Occupation: Hair dresser  Tobacco Use   Smoking status: Never   Smokeless tobacco: Never  Vaping Use   Vaping Use: Never used  Substance and Sexual Activity   Alcohol use: No   Drug use: No   Sexual activity: Yes    Partners: Male    Birth control/protection: Post-menopausal  Other Topics Concern   Not on file  Social History Narrative   Not on file   Social Determinants of Health   Financial Resource Strain: Low Risk  (06/27/2022)   Overall Financial Resource Strain (CARDIA)    Difficulty of Paying Living Expenses: Not hard at all  Food Insecurity: No Food Insecurity (06/27/2022)   Hunger Vital Sign    Worried About Running Out of Food in the Last Year: Never true    [place] in the Last Year: Never true  Transportation Needs: Unknown (06/27/2022)   PRAPARE - Hydrologist (Medical): No    Lack of Transportation (Non-Medical): Not on file  Physical Activity: Sufficiently Active (06/27/2022)   Exercise Vital Sign    Days of Exercise per Week: 5 days    Minutes of Exercise per Session: 60 min  Stress: No Stress Concern Present (06/27/2022)   [place]    Feeling of Stress : Not at all  Social Connections: Moderately Integrated (06/27/2022)   Social Connection and Isolation Panel [NHANES]    Frequency of Communication with Friends and Family: More than three times a week    Frequency of Social Gatherings with Friends and Family: Three times a week    Attends Religious Services: More  than 4 times per year    Active Member of Clubs or Organizations: No    Attends Archivist Meetings: Never    Marital Status: Married  Human resources officer Violence: Not At Risk (06/27/2022)  Humiliation, Afraid, Rape, and Kick questionnaire    Fear of Current or Ex-Partner: No    Emotionally Abused: No    Physically Abused: No    Sexually Abused: No    Review of Systems  All other systems reviewed and are negative.   PHYSICAL EXAMINATION:    BP 108/62   Pulse 65   Wt 195 lb (88.5 kg)   SpO2 100%   BMI 34.54 kg/m     General appearance: alert, cooperative and appears stated age Neck: no adenopathy, supple, symmetrical, trachea midline and thyroid {CHL AMB PHY EX THYROID NORM [phone]::"normal to inspection and palpation"} Breasts: {Exam; breast:13139::"normal appearance, no masses or tenderness"} Abdomen: soft, non-tender; non distended, no masses,  no organomegaly  Pelvic: External genitalia:  no lesions              Urethra:  normal appearing urethra with no masses, tenderness or lesions              Bartholins and Skenes: normal                 Vagina: normal appearing vagina with normal color and discharge, no lesions              Cervix: {CHL AMB PHY EX CERVIX NORM DEFAULT:[phone]::"no lesions"}              Bimanual Exam:  Uterus:  {CHL AMB PHY EX UTERUS NORM [phone]::"normal size, contour, position, consistency, mobility, non-tender"}              Adnexa: {CHL AMB PHY EX ADNEXA NO MASS DEFAULT:[phone]::"no mass, fullness, tenderness"}              Rectovaginal: {yes no:[phone]}.  Confirms.              Anus:  normal sphincter tone, no lesions  Chaperone was present for exam.   PVR 60 cc Fitted #5 ring f/u 1.5 weeks

## 2022-08-31 NOTE — Telephone Encounter (Signed)
  Follow up Call-     08/30/2022    1:42 PM  Call back number  Post procedure Call Back phone  # [phone]  Permission to leave phone message Yes     Patient questions:  Do you have a fever, pain , or abdominal swelling? No. Pain Score  0 *  Have you tolerated food without any problems? Yes.    Have you been able to return to your normal activities? Yes.    Do you have any questions about your discharge instructions: Diet   No. Medications  No. Follow up visit  No.  Do you have questions or concerns about your Care? No.  Actions: * If pain score is 4 or above: No action needed, pain <4.

## 2022-09-01 ENCOUNTER — Encounter: Payer: Self-pay | Admitting: Obstetrics and Gynecology

## 2022-09-05 ENCOUNTER — Ambulatory Visit (INDEPENDENT_AMBULATORY_CARE_PROVIDER_SITE_OTHER): Payer: Medicare Other | Admitting: Internal Medicine

## 2022-09-05 ENCOUNTER — Encounter: Payer: Self-pay | Admitting: Internal Medicine

## 2022-09-05 VITALS — BP 130/72 | HR 75 | Temp 98.6°F | Ht 63.0 in | Wt 196.6 lb

## 2022-09-05 DIAGNOSIS — K219 Gastro-esophageal reflux disease without esophagitis: Secondary | ICD-10-CM

## 2022-09-05 DIAGNOSIS — J309 Allergic rhinitis, unspecified: Secondary | ICD-10-CM

## 2022-09-05 DIAGNOSIS — F419 Anxiety disorder, unspecified: Secondary | ICD-10-CM | POA: Diagnosis not present

## 2022-09-05 DIAGNOSIS — I10 Essential (primary) hypertension: Secondary | ICD-10-CM | POA: Diagnosis not present

## 2022-09-05 DIAGNOSIS — Z23 Encounter for immunization: Secondary | ICD-10-CM | POA: Diagnosis not present

## 2022-09-05 DIAGNOSIS — E785 Hyperlipidemia, unspecified: Secondary | ICD-10-CM | POA: Diagnosis not present

## 2022-09-05 DIAGNOSIS — R7303 Prediabetes: Secondary | ICD-10-CM | POA: Diagnosis not present

## 2022-09-05 MED ORDER — IPRATROPIUM BROMIDE 0.06 % NA SOLN: 2.0000 | Freq: Three times a day (TID) | NASAL | 3 refills | Status: DC

## 2022-09-05 MED ORDER — PANTOPRAZOLE SODIUM 40 MG PO TBEC: DELAYED_RELEASE_TABLET | ORAL | 3 refills | Status: DC

## 2022-09-05 MED ORDER — IRBESARTAN 150 MG PO TABS: ORAL_TABLET | ORAL | 3 refills | Status: DC

## 2022-09-05 MED ORDER — AMLODIPINE BESYLATE 2.5 MG PO TABS: 2.5000 mg | ORAL_TABLET | Freq: Every day | ORAL | 3 refills | Status: DC

## 2022-09-05 MED ORDER — ALPRAZOLAM 0.25 MG PO TABS: ORAL_TABLET | ORAL | 3 refills | Status: DC

## 2022-09-05 NOTE — Assessment & Plan Note (Signed)
Better on Atrovent nasal

## 2022-09-05 NOTE — Assessment & Plan Note (Signed)
Continue on Xanax prn  Potential benefits of a long term benzodiazepines  use as well as potential risks  and complications were explained to the patient and were aknowledged.

## 2022-09-05 NOTE — Progress Notes (Signed)
Subjective:  Patient ID: Diane Sawyer, female    DOB: 09-13-1946  Age: 76 y.o. MRN: [id number]  CC: No chief complaint on file.   HPI Diane Sawyer presents for HTN, anxiety, runny nose  Outpatient Medications Prior to Visit  Medication Sig Dispense Refill   acetaminophen (TYLENOL) 500 MG tablet Take 1,000 mg by mouth every 6 (six) hours as needed for mild pain.     betamethasone valerate ointment (VALISONE) 0.1 % Apply 1 application topically 2 (two) times daily. 30 g 0   dorzolamide-timolol (COSOPT) 22.3-6.8 MG/ML ophthalmic solution 1 drop 2 (two) times daily.     fexofenadine (ALLEGRA) 180 MG tablet Take 180 mg by mouth daily.     IRON PO Take 65 mg by mouth daily.     latanoprost (XALATAN) 0.005 % ophthalmic solution INT 1 GTT IN OU HS     tamsulosin (FLOMAX) 0.4 MG CAPS capsule Take 1 capsule (0.4 mg total) by mouth daily. 30 capsule 0   VITAMIN D PO Take 1,000 Units by mouth daily.     ALPRAZolam (XANAX) 0.25 MG tablet TAKE 1 TABLET(0.25 MG) BY MOUTH TWICE DAILY AS NEEDED FOR ANXIETY 60 tablet 3   amLODipine (NORVASC) 2.5 MG tablet Take 1 tablet (2.5 mg total) by mouth daily. 90 tablet 3   ipratropium (ATROVENT) 0.06 % nasal spray Place 2 sprays into the nose 3 (three) times daily. Annual appt due must see provider for future refills 15 mL 0   irbesartan (AVAPRO) 150 MG tablet TAKE 1 TABLET(150 MG) BY MOUTH DAILY 90 tablet 3   pantoprazole (PROTONIX) 40 MG tablet TAKE 1 TABLET(40 MG) BY MOUTH TWICE DAILY 180 tablet 3   No facility-administered medications prior to visit.    ROS: Review of Systems  Constitutional:  Negative for activity change, appetite change, chills, fatigue and unexpected weight change.  HENT:  Positive for congestion and rhinorrhea. Negative for mouth sores and sinus pressure.   Eyes:  Negative for visual disturbance.  Respiratory:  Negative for cough and chest tightness.   Gastrointestinal:  Negative for abdominal pain and nausea.  Genitourinary:   Negative for difficulty urinating, frequency and vaginal pain.  Musculoskeletal:  Negative for back pain and gait problem.  Skin:  Negative for pallor and rash.  Neurological:  Negative for dizziness, tremors, weakness, numbness and headaches.  Psychiatric/Behavioral:  Negative for confusion and sleep disturbance. The patient is nervous/anxious.     Objective:  BP 130/72 (BP Location: Left Arm)   Pulse 75   Temp 98.6 F (37 C) (Oral)   Ht '5\' 3"'$  (1.6 m)   Wt 196 lb 9.6 oz (89.2 kg)   SpO2 96%   BMI 34.83 kg/m   BP Readings from Last 3 Encounters:  09/05/22 130/72  08/31/22 108/62  08/30/22 128/73    Wt Readings from Last 3 Encounters:  09/05/22 196 lb 9.6 oz (89.2 kg)  08/31/22 195 lb (88.5 kg)  08/30/22 196 lb (88.9 kg)    Physical Exam Constitutional:      General: She is not in acute distress.    Appearance: She is well-developed. She is obese.  HENT:     Head: Normocephalic.     Right Ear: External ear normal.     Left Ear: External ear normal.     Nose: Nose normal.  Eyes:     General:        Right eye: No discharge.        Left eye: No  discharge.     Conjunctiva/sclera: Conjunctivae normal.     Pupils: Pupils are equal, round, and reactive to light.  Neck:     Thyroid: No thyromegaly.     Vascular: No JVD.     Trachea: No tracheal deviation.  Cardiovascular:     Rate and Rhythm: Normal rate and regular rhythm.     Heart sounds: Normal heart sounds.  Pulmonary:     Effort: No respiratory distress.     Breath sounds: No stridor. No wheezing.  Abdominal:     General: Bowel sounds are normal. There is no distension.     Palpations: Abdomen is soft. There is no mass.     Tenderness: There is no abdominal tenderness. There is no guarding or rebound.  Musculoskeletal:        General: No tenderness.     Cervical back: Normal range of motion and neck supple. No rigidity.  Lymphadenopathy:     Cervical: No cervical adenopathy.  Skin:    Findings: No  erythema or rash.  Neurological:     Mental Status: She is oriented to person, place, and time.     Cranial Nerves: No cranial nerve deficit.     Motor: No abnormal muscle tone.     Coordination: Coordination normal.     Deep Tendon Reflexes: Reflexes normal.  Psychiatric:        Behavior: Behavior normal.        Thought Content: Thought content normal.        Judgment: Judgment normal.     Lab Results  Component Value Date   WBC 5.3 08/30/2021   HGB 13.4 08/30/2021   HCT 40.6 08/30/2021   PLT 198.0 08/30/2021   GLUCOSE 91 02/28/2022   CHOL 166 08/30/2021   TRIG 85.0 08/30/2021   HDL 58.00 08/30/2021   LDLCALC 91 08/30/2021   ALT 9 02/28/2022   AST 16 02/28/2022   NA 139 02/28/2022   K 4.4 02/28/2022   CL 105 02/28/2022   CREATININE 0.86 02/28/2022   BUN 16 02/28/2022   CO2 30 02/28/2022   TSH 0.91 08/30/2021   HGBA1C 5.7 10/21/2019    No results found.  Assessment & Plan:   Problem List Items Addressed This Visit     Allergic rhinitis    Better on Atrovent nasal      Anxiety    Continue on Xanax prn  Potential benefits of a long term benzodiazepines  use as well as potential risks  and complications were explained to the patient and were aknowledged.      Relevant Medications   ALPRAZolam (XANAX) 0.25 MG tablet   Hypertension    cont on Amlodipine 2.5 mg/d and Irbesartan 150 mg/d      Relevant Medications   amLODipine (NORVASC) 2.5 MG tablet   irbesartan (AVAPRO) 150 MG tablet   Prediabetes    Monitor A1c      Other Visit Diagnoses     Needs flu shot    -  Primary   Relevant Orders   Flu Vaccine QUAD High Dose(Fluad) (Completed)   Gastroesophageal reflux disease       Relevant Medications   pantoprazole (PROTONIX) 40 MG tablet         Meds ordered this encounter  Medications   ALPRAZolam (XANAX) 0.25 MG tablet    Sig: TAKE 1 TABLET(0.25 MG) BY MOUTH TWICE DAILY AS NEEDED FOR ANXIETY    Dispense:  60 tablet    Refill:  3  amLODipine (NORVASC) 2.5 MG tablet    Sig: Take 1 tablet (2.5 mg total) by mouth daily.    Dispense:  90 tablet    Refill:  3   ipratropium (ATROVENT) 0.06 % nasal spray    Sig: Place 2 sprays into the nose 3 (three) times daily. Annual appt due must see provider for future refills    Dispense:  45 mL    Refill:  3   irbesartan (AVAPRO) 150 MG tablet    Sig: TAKE 1 TABLET(150 MG) BY MOUTH DAILY    Dispense:  90 tablet    Refill:  3   pantoprazole (PROTONIX) 40 MG tablet    Sig: TAKE 1 TABLET(40 MG) BY MOUTH TWICE DAILY    Dispense:  180 tablet    Refill:  3      Follow-up: Return in about 6 months (around 03/07/2023) for a follow-up visit.  Walker Kehr, MD

## 2022-09-05 NOTE — Assessment & Plan Note (Signed)
cont on Amlodipine 2.5 mg/d and Irbesartan 150 mg/d

## 2022-09-05 NOTE — Assessment & Plan Note (Signed)
Monitor A1c 

## 2022-09-09 ENCOUNTER — Other Ambulatory Visit: Payer: Self-pay | Admitting: Internal Medicine

## 2022-09-09 DIAGNOSIS — K219 Gastro-esophageal reflux disease without esophagitis: Secondary | ICD-10-CM

## 2022-09-12 ENCOUNTER — Ambulatory Visit (INDEPENDENT_AMBULATORY_CARE_PROVIDER_SITE_OTHER): Payer: Medicare Other | Admitting: Obstetrics and Gynecology

## 2022-09-12 ENCOUNTER — Encounter: Payer: Self-pay | Admitting: Obstetrics and Gynecology

## 2022-09-12 VITALS — BP 128/66 | HR 75 | Wt 195.0 lb

## 2022-09-12 DIAGNOSIS — Z4689 Encounter for fitting and adjustment of other specified devices: Secondary | ICD-10-CM | POA: Diagnosis not present

## 2022-09-12 NOTE — Progress Notes (Signed)
GYNECOLOGY  VISIT   HPI: 76 y.o.   Married White or Caucasian Not Hispanic or Latino  female   [phone] with No LMP recorded. Patient is postmenopausal.   here for pessary follow up. Patient states that she feels like she is doing well with the #5 ring pessary with support.   GYNECOLOGIC HISTORY: No LMP recorded. Patient is postmenopausal. Contraception:pmp  Menopausal hormone therapy: none         OB History     Gravida  2   Para  2   Term  2   Preterm      AB      Living  2      SAB      IAB      Ectopic      Multiple      Live Births  2              Patient Active Problem List   Diagnosis Date Noted   Uterine prolapse 07/07/2021   Edema 04/20/2020   Cough 01/13/2020   History of COVID-19 12/25/2019   Syncope and collapse 12/25/2019   Dysuria 04/25/2019   Insulin resistance 01/14/2019   Prediabetes 01/08/2018   Other fatigue 12/25/2017   Shortness of breath on exertion 12/25/2017   Hyperglycemia 12/25/2017   Class 1 obesity with serious comorbidity and body mass index (BMI) of 32.0 to 32.9 in adult 12/25/2017   Low back pain 11/21/2016   Rash and nonspecific skin eruption 11/10/2014   Left shoulder pain 11/18/2013   Nonspecific abnormal electrocardiogram (ECG) (EKG) 11/18/2013   Dizziness 11/18/2013   Well adult exam 11/12/2012   Anxiety 11/12/2012   Hypertension 11/07/2011   Insomnia 11/07/2011   PHARYNGITIS 08/31/2009   Vitamin D deficiency 09/01/2008   ANEMIA-IRON DEFICIENCY 09/01/2008   Allergic rhinitis 09/01/2008   GERD 09/01/2008   ADENOMATOUS COLONIC POLYP 12/05/2007   HEMORRHOIDS, INTERNAL 12/05/2007   Hiatal hernia 12/05/2007   DIVERTICULOSIS, COLON 12/05/2007   Osteopenia 12/05/2007    Past Medical History:  Diagnosis Date   Adenomatous polyp of colon 2004   Allergic rhinitis    Anemia    Anxiety    Back pain    Cataract 2018   Diverticulosis    GERD (gastroesophageal reflux disease)    Glaucoma    Hypertension     Lumbar compression fracture ([place]) 09/07/2016   x 2    Menopause    Urinary incontinence    Vitamin D deficiency     Past Surgical History:  Procedure Laterality Date   CATARACT EXTRACTION, BILATERAL  04/06/2017   with cypass stent implanted   COLONOSCOPY     cypess stent left eye Left 2018   POLYPECTOMY      Current Outpatient Medications  Medication Sig Dispense Refill   acetaminophen (TYLENOL) 500 MG tablet Take 1,000 mg by mouth every 6 (six) hours as needed for mild pain.     ALPRAZolam (XANAX) 0.25 MG tablet TAKE 1 TABLET(0.25 MG) BY MOUTH TWICE DAILY AS NEEDED FOR ANXIETY 60 tablet 3   amLODipine (NORVASC) 2.5 MG tablet Take 1 tablet (2.5 mg total) by mouth daily. 90 tablet 3   betamethasone valerate ointment (VALISONE) 0.1 % Apply 1 application topically 2 (two) times daily. 30 g 0   dorzolamide-timolol (COSOPT) 22.3-6.8 MG/ML ophthalmic solution 1 drop 2 (two) times daily.     fexofenadine (ALLEGRA) 180 MG tablet Take 180 mg by mouth daily.     ipratropium (ATROVENT) 0.06 %  nasal spray Place 2 sprays into the nose 3 (three) times daily. Annual appt due must see provider for future refills 45 mL 3   irbesartan (AVAPRO) 150 MG tablet TAKE 1 TABLET(150 MG) BY MOUTH DAILY 90 tablet 3   IRON PO Take 65 mg by mouth daily.     latanoprost (XALATAN) 0.005 % ophthalmic solution INT 1 GTT IN OU HS     pantoprazole (PROTONIX) 40 MG tablet TAKE 1 TABLET(40 MG) BY MOUTH TWICE DAILY 180 tablet 3   tamsulosin (FLOMAX) 0.4 MG CAPS capsule Take 1 capsule (0.4 mg total) by mouth daily. 30 capsule 0   VITAMIN D PO Take 1,000 Units by mouth daily.     No current facility-administered medications for this visit.     ALLERGIES: Benazepril hcl, Levofloxacin, and Mobic [meloxicam]  Family History  Problem Relation Age of Onset   Stroke Mother 26   Hypertension Mother    Sudden death Mother    Anxiety disorder Mother    Lung cancer Father 88   Colon cancer Neg Hx    Esophageal cancer Neg  Hx    Stomach cancer Neg Hx    Rectal cancer Neg Hx     Social History   Socioeconomic History   Marital status: Married    Spouse name: Mortimer Fries "[place]" [place]   Number of children: 2   Years of education: Not on file   Highest education level: Not on file  Occupational History   Occupation: Hair dresser  Tobacco Use   Smoking status: Never   Smokeless tobacco: Never  Vaping Use   Vaping Use: Never used  Substance and Sexual Activity   Alcohol use: No   Drug use: No   Sexual activity: Yes    Partners: Male    Birth control/protection: Post-menopausal  Other Topics Concern   Not on file  Social History Narrative   Not on file   Social Determinants of Health   Financial Resource Strain: Low Risk  (06/27/2022)   Overall Financial Resource Strain (CARDIA)    Difficulty of Paying Living Expenses: Not hard at all  Food Insecurity: No Food Insecurity (06/27/2022)   Hunger Vital Sign    Worried About Running Out of Food in the Last Year: Never true    [place] in the Last Year: Never true  Transportation Needs: Unknown (06/27/2022)   PRAPARE - Hydrologist (Medical): No    Lack of Transportation (Non-Medical): Not on file  Physical Activity: Sufficiently Active (06/27/2022)   Exercise Vital Sign    Days of Exercise per Week: 5 days    Minutes of Exercise per Session: 60 min  Stress: No Stress Concern Present (06/27/2022)   [place]    Feeling of Stress : Not at all  Social Connections: Moderately Integrated (06/27/2022)   Social Connection and Isolation Panel [NHANES]    Frequency of Communication with Friends and Family: More than three times a week    Frequency of Social Gatherings with Friends and Family: Three times a week    Attends Religious Services: More than 4 times per year    Active Member of Clubs or Organizations: No    Attends Archivist Meetings: Never     Marital Status: Married  Human resources officer Violence: Not At Risk (06/27/2022)   Humiliation, Afraid, Rape, and Kick questionnaire    Fear of Current or Ex-Partner: No  Emotionally Abused: No    Physically Abused: No    Sexually Abused: No    Review of Systems  All other systems reviewed and are negative.   PHYSICAL EXAMINATION:    BP 128/66   Pulse 75   Wt 195 lb (88.5 kg)   SpO2 100%   BMI 34.54 kg/m     General appearance: alert, cooperative and appears stated age  Pelvic: External genitalia:  no lesions              Urethra:  normal appearing urethra with no masses, tenderness or lesions              Bartholins and Skenes: normal                 Vagina: the pessary was removed and cleaned, no vaginal irritation, the pessary was replaced              Cervix: no lesions              Chaperone was present for exam.  1. Pessary maintenance Doing well F/U in 4 weeks

## 2022-09-14 ENCOUNTER — Encounter: Payer: Self-pay | Admitting: Gastroenterology

## 2022-10-07 ENCOUNTER — Other Ambulatory Visit (HOSPITAL_COMMUNITY): Payer: Self-pay

## 2022-10-07 NOTE — Telephone Encounter (Signed)
Pharmacy Patient Advocate Encounter  Insurance verification completed.    The patient is insured through [employer]   Ran test claims for: Prolia '60mg'$ .  Pharmacy benefit copay: $780.93

## 2022-10-10 DIAGNOSIS — H2511 Age-related nuclear cataract, right eye: Secondary | ICD-10-CM | POA: Diagnosis not present

## 2022-10-10 DIAGNOSIS — Z961 Presence of intraocular lens: Secondary | ICD-10-CM | POA: Diagnosis not present

## 2022-10-10 DIAGNOSIS — H401111 Primary open-angle glaucoma, right eye, mild stage: Secondary | ICD-10-CM | POA: Diagnosis not present

## 2022-10-10 DIAGNOSIS — H26492 Other secondary cataract, left eye: Secondary | ICD-10-CM | POA: Diagnosis not present

## 2022-10-10 DIAGNOSIS — H35373 Puckering of macula, bilateral: Secondary | ICD-10-CM | POA: Diagnosis not present

## 2022-10-10 DIAGNOSIS — H401122 Primary open-angle glaucoma, left eye, moderate stage: Secondary | ICD-10-CM | POA: Diagnosis not present

## 2022-10-12 ENCOUNTER — Ambulatory Visit (INDEPENDENT_AMBULATORY_CARE_PROVIDER_SITE_OTHER): Payer: Medicare Other | Admitting: Obstetrics and Gynecology

## 2022-10-12 ENCOUNTER — Encounter: Payer: Self-pay | Admitting: Obstetrics and Gynecology

## 2022-10-12 VITALS — BP 116/78 | HR 66 | Wt 195.8 lb

## 2022-10-12 DIAGNOSIS — N3941 Urge incontinence: Secondary | ICD-10-CM

## 2022-10-12 DIAGNOSIS — Z4689 Encounter for fitting and adjustment of other specified devices: Secondary | ICD-10-CM | POA: Diagnosis not present

## 2022-10-12 NOTE — Progress Notes (Unsigned)
GYNECOLOGY  VISIT   HPI: 76 y.o.   Married White or Caucasian Not Hispanic or Latino  female   [phone] with No LMP recorded. Patient is postmenopausal.   here for a pessary check. She has a cystocele and was fitted with a a #4 ring pessary with support at the end of 9/23. Prolapse is controlled. No bleeding. Voiding better, incontinence is less but not gone. She has urge incontinence, she leaks small amounts 2 days a week. She has nocturia 1-2 x a night.  Normal BM.   GYNECOLOGIC HISTORY: No LMP recorded. Patient is postmenopausal. Contraception:PMP Menopausal hormone therapy: none        OB History     Gravida  2   Para  2   Term  2   Preterm      AB      Living  2      SAB      IAB      Ectopic      Multiple      Live Births  2              Patient Active Problem List   Diagnosis Date Noted   Uterine prolapse 07/07/2021   Edema 04/20/2020   Cough 01/13/2020   History of COVID-19 12/25/2019   Syncope and collapse 12/25/2019   Dysuria 04/25/2019   Insulin resistance 01/14/2019   Prediabetes 01/08/2018   Other fatigue 12/25/2017   Shortness of breath on exertion 12/25/2017   Hyperglycemia 12/25/2017   Class 1 obesity with serious comorbidity and body mass index (BMI) of 32.0 to 32.9 in adult 12/25/2017   Low back pain 11/21/2016   Rash and nonspecific skin eruption 11/10/2014   Left shoulder pain 11/18/2013   Nonspecific abnormal electrocardiogram (ECG) (EKG) 11/18/2013   Dizziness 11/18/2013   Well adult exam 11/12/2012   Anxiety 11/12/2012   Hypertension 11/07/2011   Insomnia 11/07/2011   PHARYNGITIS 08/31/2009   Vitamin D deficiency 09/01/2008   ANEMIA-IRON DEFICIENCY 09/01/2008   Allergic rhinitis 09/01/2008   GERD 09/01/2008   ADENOMATOUS COLONIC POLYP 12/05/2007   HEMORRHOIDS, INTERNAL 12/05/2007   Hiatal hernia 12/05/2007   DIVERTICULOSIS, COLON 12/05/2007   Osteopenia 12/05/2007    Past Medical History:  Diagnosis Date    Adenomatous polyp of colon 2004   Allergic rhinitis    Anemia    Anxiety    Back pain    Cataract 2018   Diverticulosis    GERD (gastroesophageal reflux disease)    Glaucoma    Hypertension    Lumbar compression fracture ([place]) 09/07/2016   x 2    Menopause    Urinary incontinence    Vitamin D deficiency     Past Surgical History:  Procedure Laterality Date   CATARACT EXTRACTION, BILATERAL  04/06/2017   with cypass stent implanted   COLONOSCOPY     cypess stent left eye Left 2018   POLYPECTOMY      Current Outpatient Medications  Medication Sig Dispense Refill   acetaminophen (TYLENOL) 500 MG tablet Take 1,000 mg by mouth every 6 (six) hours as needed for mild pain.     ALPRAZolam (XANAX) 0.25 MG tablet TAKE 1 TABLET(0.25 MG) BY MOUTH TWICE DAILY AS NEEDED FOR ANXIETY 60 tablet 3   amLODipine (NORVASC) 2.5 MG tablet Take 1 tablet (2.5 mg total) by mouth daily. 90 tablet 3   betamethasone valerate ointment (VALISONE) 0.1 % Apply 1 application topically 2 (two) times daily. 30 g 0  dorzolamide-timolol (COSOPT) 22.3-6.8 MG/ML ophthalmic solution 1 drop 2 (two) times daily.     fexofenadine (ALLEGRA) 180 MG tablet Take 180 mg by mouth daily.     ipratropium (ATROVENT) 0.06 % nasal spray Place 2 sprays into the nose 3 (three) times daily. Annual appt due must see provider for future refills 45 mL 3   irbesartan (AVAPRO) 150 MG tablet TAKE 1 TABLET(150 MG) BY MOUTH DAILY 90 tablet 3   IRON PO Take 65 mg by mouth daily.     latanoprost (XALATAN) 0.005 % ophthalmic solution INT 1 GTT IN OU HS     pantoprazole (PROTONIX) 40 MG tablet TAKE 1 TABLET(40 MG) BY MOUTH TWICE DAILY 180 tablet 3   VITAMIN D PO Take 1,000 Units by mouth daily.     No current facility-administered medications for this visit.     ALLERGIES: Benazepril hcl, Levofloxacin, and Mobic [meloxicam]  Family History  Problem Relation Age of Onset   Stroke Mother 82   Hypertension Mother    Sudden death Mother     Anxiety disorder Mother    Lung cancer Father 52   Colon cancer Neg Hx    Esophageal cancer Neg Hx    Stomach cancer Neg Hx    Rectal cancer Neg Hx     Social History   Socioeconomic History   Marital status: Married    Spouse name: Mortimer Fries "[place]" [place]   Number of children: 2   Years of education: Not on file   Highest education level: Not on file  Occupational History   Occupation: Hair dresser  Tobacco Use   Smoking status: Never   Smokeless tobacco: Never  Vaping Use   Vaping Use: Never used  Substance and Sexual Activity   Alcohol use: No   Drug use: No   Sexual activity: Yes    Partners: Male    Birth control/protection: Post-menopausal  Other Topics Concern   Not on file  Social History Narrative   Not on file   Social Determinants of Health   Financial Resource Strain: Low Risk  (06/27/2022)   Overall Financial Resource Strain (CARDIA)    Difficulty of Paying Living Expenses: Not hard at all  Food Insecurity: No Food Insecurity (06/27/2022)   Hunger Vital Sign    Worried About Running Out of Food in the Last Year: Never true    [place] in the Last Year: Never true  Transportation Needs: Unknown (06/27/2022)   PRAPARE - Hydrologist (Medical): No    Lack of Transportation (Non-Medical): Not on file  Physical Activity: Sufficiently Active (06/27/2022)   Exercise Vital Sign    Days of Exercise per Week: 5 days    Minutes of Exercise per Session: 60 min  Stress: No Stress Concern Present (06/27/2022)   [place]    Feeling of Stress : Not at all  Social Connections: Moderately Integrated (06/27/2022)   Social Connection and Isolation Panel [NHANES]    Frequency of Communication with Friends and Family: More than three times a week    Frequency of Social Gatherings with Friends and Family: Three times a week    Attends Religious Services: More than 4 times per  year    Active Member of Clubs or Organizations: No    Attends Archivist Meetings: Never    Marital Status: Married  Human resources officer Violence: Not At Risk (06/27/2022)   Humiliation, Afraid,  Rape, and Kick questionnaire    Fear of Current or Ex-Partner: No    Emotionally Abused: No    Physically Abused: No    Sexually Abused: No    Review of Systems  All other systems reviewed and are negative.   PHYSICAL EXAMINATION:    BP 116/78   Pulse 66   Wt 195 lb 12.8 oz (88.8 kg)   SpO2 100%   BMI 34.68 kg/m     General appearance: alert, cooperative and appears stated age  Pelvic: External genitalia:  no lesions              Urethra:  normal appearing urethra with no masses, tenderness or lesions              Bartholins and Skenes: normal                 Vagina: the pessary was removed and cleaned, no vaginal irritation, the pessary was replaced.               Cervix: no lesions               Chaperone was present for exam.  1. Pessary maintenance Doing well F/U in 3 months  2. Urge incontinence of urine Improved with the pessary, but not gone Discussed cutting back or stopping caffeine Information on bladder training and kegel exercises given Discussed the option of PT or medication, she declines for now

## 2022-10-12 NOTE — Patient Instructions (Signed)
Kegel Exercises  Kegel exercises can help strengthen your pelvic floor muscles. The pelvic floor is a group of muscles that support your rectum, small intestine, and bladder. In females, pelvic floor muscles also help support the uterus. These muscles help you control the flow of urine and stool (feces). Kegel exercises are painless and simple. They do not require any equipment. Your provider may suggest Kegel exercises to: Improve bladder and bowel control. Improve sexual response. Improve weak pelvic floor muscles after surgery to remove the uterus (hysterectomy) or after pregnancy, in females. Improve weak pelvic floor muscles after prostate gland removal or surgery, in males. Kegel exercises involve squeezing your pelvic floor muscles. These are the same muscles you squeeze when you try to stop the flow of urine or keep from passing gas. The exercises can be done while sitting, standing, or lying down, but it is best to vary your position. Ask your health care provider which exercises are safe for you. Do exercises exactly as told by your health care provider and adjust them as directed. Do not begin these exercises until told by your health care provider. Exercises How to do Kegel exercises: Squeeze your pelvic floor muscles tight. You should feel a tight lift in your rectal area. If you are a female, you should also feel a tightness in your vaginal area. Keep your stomach, buttocks, and legs relaxed. Hold the muscles tight for up to 10 seconds. Breathe normally. Relax your muscles for up to 10 seconds. Repeat as told by your health care provider. Repeat this exercise daily as told by your health care provider. Continue to do this exercise for at least 4-6 weeks, or for as long as told by your health care provider. You may be referred to a physical therapist who can help you learn more about how to do Kegel exercises. Depending on your condition, your health care provider may  recommend: Varying how long you squeeze your muscles. Doing several sets of exercises every day. Doing exercises for several weeks. Making Kegel exercises a part of your regular exercise routine. This information is not intended to replace advice given to you by your health care provider. Make sure you discuss any questions you have with your health care provider. Document Revised: 04/01/2021 Document Reviewed: 04/01/2021 Elsevier Patient Education  2023 Elsevier Inc.  

## 2022-10-31 NOTE — Telephone Encounter (Signed)
Prolia VOB initiated via [email or web address] 

## 2022-11-01 NOTE — Telephone Encounter (Signed)
Pt ready for scheduling on or after 11/25/22   Out-of-pocket cost due at time of visit: $0   Primary: Medicare Prolia co-insurance: 0% Admin fee co-insurance: 0%    Secondary: BCBS [place] Medicare Supp Prolia co-insurance: Covers Medicare Part B co-insurance Admin fee co-insurance: Covers Medicare Part B co-insurance   Deductible:  Covered by secondary   Prior Auth: not required PA# Valid:    ** This summary of benefits is an estimation of the patient's out-of-pocket cost. Exact cost may vary based on individual plan coverage.

## 2022-11-30 ENCOUNTER — Ambulatory Visit (INDEPENDENT_AMBULATORY_CARE_PROVIDER_SITE_OTHER): Payer: Medicare Other

## 2022-11-30 DIAGNOSIS — M810 Age-related osteoporosis without current pathological fracture: Secondary | ICD-10-CM

## 2022-11-30 MED ORDER — DENOSUMAB 60 MG/ML ~~LOC~~ SOSY
60.0000 mg | PREFILLED_SYRINGE | Freq: Once | SUBCUTANEOUS | Status: AC
  Administered 2022-11-30: 60 mg via SUBCUTANEOUS

## 2022-11-30 NOTE — Progress Notes (Addendum)
After obtaining consent, and per orders of Dr. Alain Marion, injection of prolia given left arm subcut by Marrian Salvage. Patient instructed to report any adverse reaction to me immediately.   Medical screening examination/treatment/procedure(s) were performed by non-physician practitioner and as supervising physician I was immediately available for consultation/collaboration.  I agree with above. Lew Dawes, MD

## 2023-01-09 ENCOUNTER — Ambulatory Visit: Payer: Medicare Other | Admitting: Obstetrics and Gynecology

## 2023-01-11 ENCOUNTER — Encounter: Payer: Self-pay | Admitting: Obstetrics and Gynecology

## 2023-01-11 ENCOUNTER — Ambulatory Visit (INDEPENDENT_AMBULATORY_CARE_PROVIDER_SITE_OTHER): Payer: Medicare Other | Admitting: Obstetrics and Gynecology

## 2023-01-11 VITALS — BP 128/70 | HR 89 | Wt 198.0 lb

## 2023-01-11 DIAGNOSIS — T8389XA Other specified complication of genitourinary prosthetic devices, implants and grafts, initial encounter: Secondary | ICD-10-CM | POA: Diagnosis not present

## 2023-01-11 DIAGNOSIS — N898 Other specified noninflammatory disorders of vagina: Secondary | ICD-10-CM | POA: Diagnosis not present

## 2023-01-11 DIAGNOSIS — Z4689 Encounter for fitting and adjustment of other specified devices: Secondary | ICD-10-CM

## 2023-01-11 DIAGNOSIS — N952 Postmenopausal atrophic vaginitis: Secondary | ICD-10-CM

## 2023-01-11 LAB — WET PREP FOR TRICH, YEAST, CLUE

## 2023-01-11 MED ORDER — ESTRADIOL 0.1 MG/GM VA CREA: TOPICAL_CREAM | VAGINAL | 1 refills | Status: DC

## 2023-01-11 NOTE — Progress Notes (Signed)
GYNECOLOGY  VISIT   HPI: 77 y.o.   Married White or Caucasian Not Hispanic or Latino  female   [phone] with No LMP recorded. Patient is postmenopausal.   here for pessary maintenance. She has a #4 ring pessary with support.  She states that she feels like her bladder is empting her bladder well and that she doesn't even notice the pessary. She has some yellow vaginal d/c, no odor, no irritation. No vaginal bleeding.  Her urge incontinence is better. Up to void 1-2 x a night to void (improved with the pessary).   She says that after her visit for her pessary she feels irritated and she has some old antibiotics and she takes one after her visit. She is not sure what the medication is. She has cut out 98% of caffeine.   Infrequently sexually active, hasn't been since the pessary was inserted in 9/23. She doesn't take the pessary out.   GYNECOLOGIC HISTORY: No LMP recorded. Patient is postmenopausal. Contraception:pmp Menopausal hormone therapy: none         OB History     Gravida  2   Para  2   Term  2   Preterm      AB      Living  2      SAB      IAB      Ectopic      Multiple      Live Births  2              Patient Active Problem List   Diagnosis Date Noted   Uterine prolapse 07/07/2021   Edema 04/20/2020   Cough 01/13/2020   History of COVID-19 12/25/2019   Syncope and collapse 12/25/2019   Dysuria 04/25/2019   Insulin resistance 01/14/2019   Prediabetes 01/08/2018   Other fatigue 12/25/2017   Shortness of breath on exertion 12/25/2017   Hyperglycemia 12/25/2017   Class 1 obesity with serious comorbidity and body mass index (BMI) of 32.0 to 32.9 in adult 12/25/2017   Low back pain 11/21/2016   Rash and nonspecific skin eruption 11/10/2014   Left shoulder pain 11/18/2013   Nonspecific abnormal electrocardiogram (ECG) (EKG) 11/18/2013   Dizziness 11/18/2013   Well adult exam 11/12/2012   Anxiety 11/12/2012   Hypertension 11/07/2011   Insomnia  11/07/2011   PHARYNGITIS 08/31/2009   Vitamin D deficiency 09/01/2008   ANEMIA-IRON DEFICIENCY 09/01/2008   Allergic rhinitis 09/01/2008   GERD 09/01/2008   ADENOMATOUS COLONIC POLYP 12/05/2007   HEMORRHOIDS, INTERNAL 12/05/2007   Hiatal hernia 12/05/2007   DIVERTICULOSIS, COLON 12/05/2007   Osteopenia 12/05/2007    Past Medical History:  Diagnosis Date   Adenomatous polyp of colon 2004   Allergic rhinitis    Anemia    Anxiety    Back pain    Cataract 2018   Diverticulosis    GERD (gastroesophageal reflux disease)    Glaucoma    Hypertension    Lumbar compression fracture ([place]) 09/07/2016   x 2    Menopause    Urinary incontinence    Vitamin D deficiency     Past Surgical History:  Procedure Laterality Date   CATARACT EXTRACTION, BILATERAL  04/06/2017   with cypass stent implanted   COLONOSCOPY     cypess stent left eye Left 2018   POLYPECTOMY      Current Outpatient Medications  Medication Sig Dispense Refill   acetaminophen (TYLENOL) 500 MG tablet Take 1,000 mg by mouth every 6 (six)  hours as needed for mild pain.     ALPRAZolam (XANAX) 0.25 MG tablet TAKE 1 TABLET(0.25 MG) BY MOUTH TWICE DAILY AS NEEDED FOR ANXIETY 60 tablet 3   amLODipine (NORVASC) 2.5 MG tablet Take 1 tablet (2.5 mg total) by mouth daily. 90 tablet 3   betamethasone valerate ointment (VALISONE) 0.1 % Apply 1 application topically 2 (two) times daily. 30 g 0   dorzolamide-timolol (COSOPT) 22.3-6.8 MG/ML ophthalmic solution 1 drop 2 (two) times daily.     fexofenadine (ALLEGRA) 180 MG tablet Take 180 mg by mouth daily.     ipratropium (ATROVENT) 0.06 % nasal spray Place 2 sprays into the nose 3 (three) times daily. Annual appt due must see provider for future refills 45 mL 3   irbesartan (AVAPRO) 150 MG tablet TAKE 1 TABLET(150 MG) BY MOUTH DAILY 90 tablet 3   IRON PO Take 65 mg by mouth daily.     latanoprost (XALATAN) 0.005 % ophthalmic solution INT 1 GTT IN OU HS     pantoprazole (PROTONIX)  40 MG tablet TAKE 1 TABLET(40 MG) BY MOUTH TWICE DAILY 180 tablet 3   VITAMIN D PO Take 1,000 Units by mouth daily.     No current facility-administered medications for this visit.     ALLERGIES: Benazepril hcl, Levofloxacin, and Mobic [meloxicam]  Family History  Problem Relation Age of Onset   Stroke Mother 42   Hypertension Mother    Sudden death Mother    Anxiety disorder Mother    Lung cancer Father 72   Colon cancer Neg Hx    Esophageal cancer Neg Hx    Stomach cancer Neg Hx    Rectal cancer Neg Hx     Social History   Socioeconomic History   Marital status: Married    Spouse name: Mortimer Fries "[place]" [place]   Number of children: 2   Years of education: Not on file   Highest education level: Not on file  Occupational History   Occupation: Hair dresser  Tobacco Use   Smoking status: Never   Smokeless tobacco: Never  Vaping Use   Vaping Use: Never used  Substance and Sexual Activity   Alcohol use: No   Drug use: No   Sexual activity: Yes    Partners: Male    Birth control/protection: Post-menopausal  Other Topics Concern   Not on file  Social History Narrative   Not on file   Social Determinants of Health   Financial Resource Strain: Low Risk  (06/27/2022)   Overall Financial Resource Strain (CARDIA)    Difficulty of Paying Living Expenses: Not hard at all  Food Insecurity: No Food Insecurity (06/27/2022)   Hunger Vital Sign    Worried About Running Out of Food in the Last Year: Never true    [place] in the Last Year: Never true  Transportation Needs: Unknown (06/27/2022)   PRAPARE - Hydrologist (Medical): No    Lack of Transportation (Non-Medical): Not on file  Physical Activity: Sufficiently Active (06/27/2022)   Exercise Vital Sign    Days of Exercise per Week: 5 days    Minutes of Exercise per Session: 60 min  Stress: No Stress Concern Present (06/27/2022)   [place]    Feeling of Stress : Not at all  Social Connections: Moderately Integrated (06/27/2022)   Social Connection and Isolation Panel [NHANES]    Frequency of Communication with Friends and Family:  More than three times a week    Frequency of Social Gatherings with Friends and Family: Three times a week    Attends Religious Services: More than 4 times per year    Active Member of Clubs or Organizations: No    Attends Archivist Meetings: Never    Marital Status: Married  Human resources officer Violence: Not At Risk (06/27/2022)   Humiliation, Afraid, Rape, and Kick questionnaire    Fear of Current or Ex-Partner: No    Emotionally Abused: No    Physically Abused: No    Sexually Abused: No    Review of Systems  All other systems reviewed and are negative.   PHYSICAL EXAMINATION:    BP 128/70   Pulse 89   Wt 198 lb (89.8 kg)   SpO2 100%   BMI 35.07 kg/m     General appearance: alert, cooperative and appears stated age  Pelvic: External genitalia:  no lesions              Urethra:  normal appearing urethra with no masses, tenderness or lesions              Bartholins and Skenes: normal                 Vagina: the pessary was removed and cleaned, increase in thin, yellow vaginal discharge. There is erythema/irritation in the posterior & right vaginal apex. The pessary was left out.              Cervix: no lesions                Chaperone was present for exam.  1. Pessary maintenance  2. Vaginal irritation from pessary [facility]) Pessary left out, negative wet prep Will start vaginal estrogen and f/u in 2 weeks.   3. Vaginal discharge - WET PREP FOR TRICH, YEAST, CLUE  4. Vaginal atrophy No contraindications, small risks reviewed - estradiol (ESTRACE) 0.1 MG/GM vaginal cream; 1 gram vaginally every night for one week, then change to twice weekly at hs  Dispense: 42.5 g; Refill: 1

## 2023-01-25 ENCOUNTER — Encounter: Payer: Self-pay | Admitting: Obstetrics and Gynecology

## 2023-01-25 ENCOUNTER — Ambulatory Visit (INDEPENDENT_AMBULATORY_CARE_PROVIDER_SITE_OTHER): Payer: Medicare Other | Admitting: Obstetrics and Gynecology

## 2023-01-25 ENCOUNTER — Ambulatory Visit: Payer: Medicare Other | Admitting: Obstetrics and Gynecology

## 2023-01-25 VITALS — BP 116/70 | HR 78 | Wt 199.0 lb

## 2023-01-25 DIAGNOSIS — Z4689 Encounter for fitting and adjustment of other specified devices: Secondary | ICD-10-CM | POA: Diagnosis not present

## 2023-01-25 NOTE — Progress Notes (Signed)
GYNECOLOGY  VISIT   HPI: 77 y.o.   Married White or Caucasian Not Hispanic or Latino  female   [phone] with No LMP recorded. Patient is postmenopausal.   here for two week pessary follow up.   She was noted to vaginal irritation at her visit a few weeks ago. Her pessary was left out and she was started on vaginal estrogen. She is doing okay, but ready to have the pessary back in. No vaginal bleeding, no abnormal vaginal discharge.  GYNECOLOGIC HISTORY: No LMP recorded. Patient is postmenopausal. Contraception:pmp Menopausal hormone therapy: estrace         OB History     Gravida  2   Para  2   Term  2   Preterm      AB      Living  2      SAB      IAB      Ectopic      Multiple      Live Births  2              Patient Active Problem List   Diagnosis Date Noted   Uterine prolapse 07/07/2021   Edema 04/20/2020   Cough 01/13/2020   History of COVID-19 12/25/2019   Syncope and collapse 12/25/2019   Dysuria 04/25/2019   Insulin resistance 01/14/2019   Prediabetes 01/08/2018   Other fatigue 12/25/2017   Shortness of breath on exertion 12/25/2017   Hyperglycemia 12/25/2017   Class 1 obesity with serious comorbidity and body mass index (BMI) of 32.0 to 32.9 in adult 12/25/2017   Low back pain 11/21/2016   Rash and nonspecific skin eruption 11/10/2014   Left shoulder pain 11/18/2013   Nonspecific abnormal electrocardiogram (ECG) (EKG) 11/18/2013   Dizziness 11/18/2013   Well adult exam 11/12/2012   Anxiety 11/12/2012   Hypertension 11/07/2011   Insomnia 11/07/2011   PHARYNGITIS 08/31/2009   Vitamin D deficiency 09/01/2008   ANEMIA-IRON DEFICIENCY 09/01/2008   Allergic rhinitis 09/01/2008   GERD 09/01/2008   ADENOMATOUS COLONIC POLYP 12/05/2007   HEMORRHOIDS, INTERNAL 12/05/2007   Hiatal hernia 12/05/2007   DIVERTICULOSIS, COLON 12/05/2007   Osteopenia 12/05/2007    Past Medical History:  Diagnosis Date   Adenomatous polyp of colon 2004    Allergic rhinitis    Anemia    Anxiety    Back pain    Cataract 2018   Diverticulosis    GERD (gastroesophageal reflux disease)    Glaucoma    Hypertension    Lumbar compression fracture ([place]) 09/07/2016   x 2    Menopause    Urinary incontinence    Vitamin D deficiency     Past Surgical History:  Procedure Laterality Date   CATARACT EXTRACTION, BILATERAL  04/06/2017   with cypass stent implanted   COLONOSCOPY     cypess stent left eye Left 2018   POLYPECTOMY      Current Outpatient Medications  Medication Sig Dispense Refill   acetaminophen (TYLENOL) 500 MG tablet Take 1,000 mg by mouth every 6 (six) hours as needed for mild pain.     ALPRAZolam (XANAX) 0.25 MG tablet TAKE 1 TABLET(0.25 MG) BY MOUTH TWICE DAILY AS NEEDED FOR ANXIETY 60 tablet 3   amLODipine (NORVASC) 2.5 MG tablet Take 1 tablet (2.5 mg total) by mouth daily. 90 tablet 3   betamethasone valerate ointment (VALISONE) 0.1 % Apply 1 application topically 2 (two) times daily. 30 g 0   dorzolamide-timolol (COSOPT) 22.3-6.8 MG/ML ophthalmic solution  1 drop 2 (two) times daily.     estradiol (ESTRACE) 0.1 MG/GM vaginal cream 1 gram vaginally every night for one week, then change to twice weekly at hs 42.5 g 1   fexofenadine (ALLEGRA) 180 MG tablet Take 180 mg by mouth daily.     ipratropium (ATROVENT) 0.06 % nasal spray Place 2 sprays into the nose 3 (three) times daily. Annual appt due must see provider for future refills 45 mL 3   irbesartan (AVAPRO) 150 MG tablet TAKE 1 TABLET(150 MG) BY MOUTH DAILY 90 tablet 3   IRON PO Take 65 mg by mouth daily.     latanoprost (XALATAN) 0.005 % ophthalmic solution INT 1 GTT IN OU HS     pantoprazole (PROTONIX) 40 MG tablet TAKE 1 TABLET(40 MG) BY MOUTH TWICE DAILY 180 tablet 3   VITAMIN D PO Take 1,000 Units by mouth daily.     No current facility-administered medications for this visit.     ALLERGIES: Benazepril hcl, Levofloxacin, and Mobic [meloxicam]  Family History   Problem Relation Age of Onset   Stroke Mother 8   Hypertension Mother    Sudden death Mother    Anxiety disorder Mother    Lung cancer Father 82   Colon cancer Neg Hx    Esophageal cancer Neg Hx    Stomach cancer Neg Hx    Rectal cancer Neg Hx     Social History   Socioeconomic History   Marital status: Married    Spouse name: Mortimer Fries "[place]" [place]   Number of children: 2   Years of education: Not on file   Highest education level: Not on file  Occupational History   Occupation: Hair dresser  Tobacco Use   Smoking status: Never   Smokeless tobacco: Never  Vaping Use   Vaping Use: Never used  Substance and Sexual Activity   Alcohol use: No   Drug use: No   Sexual activity: Yes    Partners: Male    Birth control/protection: Post-menopausal  Other Topics Concern   Not on file  Social History Narrative   Not on file   Social Determinants of Health   Financial Resource Strain: Low Risk  (06/27/2022)   Overall Financial Resource Strain (CARDIA)    Difficulty of Paying Living Expenses: Not hard at all  Food Insecurity: No Food Insecurity (06/27/2022)   Hunger Vital Sign    Worried About Running Out of Food in the Last Year: Never true    [place] in the Last Year: Never true  Transportation Needs: Unknown (06/27/2022)   PRAPARE - Hydrologist (Medical): No    Lack of Transportation (Non-Medical): Not on file  Physical Activity: Sufficiently Active (06/27/2022)   Exercise Vital Sign    Days of Exercise per Week: 5 days    Minutes of Exercise per Session: 60 min  Stress: No Stress Concern Present (06/27/2022)   [place]    Feeling of Stress : Not at all  Social Connections: Moderately Integrated (06/27/2022)   Social Connection and Isolation Panel [NHANES]    Frequency of Communication with Friends and Family: More than three times a week    Frequency of Social  Gatherings with Friends and Family: Three times a week    Attends Religious Services: More than 4 times per year    Active Member of Clubs or Organizations: No    Attends Archivist  Meetings: Never    Marital Status: Married  Human resources officer Violence: Not At Risk (06/27/2022)   Humiliation, Afraid, Rape, and Kick questionnaire    Fear of Current or Ex-Partner: No    Emotionally Abused: No    Physically Abused: No    Sexually Abused: No    Review of Systems  All other systems reviewed and are negative.   PHYSICAL EXAMINATION:    BP 116/70   Pulse 78   Wt 199 lb (90.3 kg)   SpO2 100%   BMI 35.25 kg/m     General appearance: alert, cooperative and appears stated age  Pelvic: External genitalia:  no lesions              Urethra:  normal appearing urethra with no masses, tenderness or lesions              Bartholins and Skenes: normal                 Vagina: normal appearing vagina with normal color and discharge, no lesions, well healed. Her pessary was replaced.               Cervix: no lesions                Chaperone was present for exam.  1. Pessary maintenance Her prior vaginal irritation from the pessary has healed Pessary replaced Continue vaginal estrogen 2 x a week F/U in one month, she will bring her estrogen cream with her

## 2023-01-26 ENCOUNTER — Ambulatory Visit
Admission: RE | Admit: 2023-01-26 | Discharge: 2023-01-26 | Disposition: A | Discharge status: Home / Self Care | Payer: Medicare Other | Source: Ambulatory Visit | Attending: Family Medicine | Admitting: Family Medicine

## 2023-01-26 VITALS — BP 169/103 | HR 108 | Temp 99.0°F | Resp 16

## 2023-01-26 DIAGNOSIS — R0981 Nasal congestion: Secondary | ICD-10-CM | POA: Diagnosis not present

## 2023-01-26 DIAGNOSIS — J302 Other seasonal allergic rhinitis: Secondary | ICD-10-CM | POA: Diagnosis not present

## 2023-01-26 HISTORY — DX: Cystocele, unspecified: N81.10

## 2023-01-26 MED ORDER — AMOXICILLIN 500 MG PO CAPS: 1000.0000 mg | ORAL_CAPSULE | Freq: Two times a day (BID) | ORAL | 0 refills | Status: DC

## 2023-01-26 MED ORDER — PREDNISONE 20 MG PO TABS: 20.0000 mg | ORAL_TABLET | Freq: Two times a day (BID) | ORAL | 0 refills | Status: DC

## 2023-01-26 NOTE — ED Triage Notes (Signed)
Nasal congestion starting Tuesday. Two negative covid tests at home. Reports headache, cough, fatigue. Denies fever, chills, ear pain, sore throat, generalized body aches, CP, SOB, palpitations, wheezing, abdominal pain, N/V/D. Taking tylenol to manage symptoms

## 2023-01-26 NOTE — Discharge Instructions (Signed)
Make sure you are drinking lots of water Continue Allegra every day Use your Atrovent as needed for runny nose Add prednisone 2 times a day.  This will help control the allergy symptoms  If you get worse instead of better over the next few days, I gave you a prescription for amoxicillin to fill and take

## 2023-01-26 NOTE — ED Provider Notes (Signed)
[facility]    CSN: [id number] Arrival date & time: 01/26/23  1828      History   Chief Complaint Chief Complaint  Patient presents with   Nasal Congestion    Entered by patient    HPI Diane Sawyer is a 77 y.o. female.   HPI  Diane Sawyer is here with 3 days of nasal congestion.  She also has a headache, slight postnasal drip, causing her to cough.  She feels fatigued.  Clear mucus.  States she has allergies and takes Allegra daily with Atrovent as needed.  She states in spite of her allergy regimen she sometimes gets overwhelming allergy symptoms.  Sometimes gets sinus infections and need antibiotic.  She states she is here early so she does not get worse.  She has done COVID testing at home. No sweats or chills or fever.  No body aches.  Headaches are from the sinus pressure.  Past Medical History:  Diagnosis Date   Adenomatous polyp of colon 2004   Allergic rhinitis    Anemia    Anxiety    Back pain    Bladder prolapse, female, acquired    Cataract 2018   Diverticulosis    GERD (gastroesophageal reflux disease)    Glaucoma    Hypertension    Lumbar compression fracture ([place]) 09/07/2016   x 2    Menopause    Urinary incontinence    Vitamin D deficiency     Patient Active Problem List   Diagnosis Date Noted   Uterine prolapse 07/07/2021   Edema 04/20/2020   Cough 01/13/2020   History of COVID-19 12/25/2019   Syncope and collapse 12/25/2019   Dysuria 04/25/2019   Insulin resistance 01/14/2019   Prediabetes 01/08/2018   Other fatigue 12/25/2017   Shortness of breath on exertion 12/25/2017   Hyperglycemia 12/25/2017   Class 1 obesity with serious comorbidity and body mass index (BMI) of 32.0 to 32.9 in adult 12/25/2017   Low back pain 11/21/2016   Rash and nonspecific skin eruption 11/10/2014   Left shoulder pain 11/18/2013   Nonspecific abnormal electrocardiogram (ECG) (EKG) 11/18/2013   Dizziness 11/18/2013   Well adult exam 11/12/2012    Anxiety 11/12/2012   Hypertension 11/07/2011   Insomnia 11/07/2011   PHARYNGITIS 08/31/2009   Vitamin D deficiency 09/01/2008   ANEMIA-IRON DEFICIENCY 09/01/2008   Allergic rhinitis 09/01/2008   GERD 09/01/2008   ADENOMATOUS COLONIC POLYP 12/05/2007   HEMORRHOIDS, INTERNAL 12/05/2007   Hiatal hernia 12/05/2007   DIVERTICULOSIS, COLON 12/05/2007   Osteopenia 12/05/2007    Past Surgical History:  Procedure Laterality Date   CATARACT EXTRACTION, BILATERAL  04/06/2017   with cypass stent implanted   COLONOSCOPY     cypess stent left eye Left 2018   POLYPECTOMY      OB History     Gravida  2   Para  2   Term  2   Preterm      AB      Living  2      SAB      IAB      Ectopic      Multiple      Live Births  2            Home Medications    Prior to Admission medications   Medication Sig Start Date End Date Taking? Authorizing Provider  ALPRAZolam (XANAX) 0.25 MG tablet TAKE 1 TABLET(0.25 MG) BY MOUTH TWICE DAILY AS NEEDED FOR ANXIETY 09/05/22  Yes Plotnikov, Evie Lacks, MD  amLODipine (NORVASC) 2.5 MG tablet Take 1 tablet (2.5 mg total) by mouth daily. 09/05/22  Yes Plotnikov, Evie Lacks, MD  amoxicillin (AMOXIL) 500 MG capsule Take 2 capsules (1,000 mg total) by mouth 2 (two) times daily. 01/26/23  Yes Raylene Everts, MD  dorzolamide-timolol (COSOPT) 22.3-6.8 MG/ML ophthalmic solution 1 drop 2 (two) times daily. 08/28/22  Yes [provider]  fexofenadine (ALLEGRA) 180 MG tablet Take 180 mg by mouth daily.   Yes [provider]  irbesartan (AVAPRO) 150 MG tablet TAKE 1 TABLET(150 MG) BY MOUTH DAILY 09/05/22  Yes Plotnikov, Evie Lacks, MD  IRON PO Take 65 mg by mouth daily.   Yes [provider]  latanoprost (XALATAN) 0.005 % ophthalmic solution INT 1 GTT IN OU HS 06/04/19  Yes [provider]  predniSONE (DELTASONE) 20 MG tablet Take 1 tablet (20 mg total) by mouth 2 (two) times daily with a meal. 01/26/23  Yes Raylene Everts, MD  VITAMIN D PO Take 1,000 Units by mouth daily.   Yes [provider]  acetaminophen (TYLENOL) 500 MG tablet Take 1,000 mg by mouth every 6 (six) hours as needed for mild pain.    [provider]  estradiol (ESTRACE) 0.1 MG/GM vaginal cream 1 gram vaginally every night for one week, then change to twice weekly at hs 01/11/23   Salvadore Dom, MD  ipratropium (ATROVENT) 0.06 % nasal spray Place 2 sprays into the nose 3 (three) times daily. Annual appt due must see provider for future refills 09/05/22   Plotnikov, Evie Lacks, MD  pantoprazole (PROTONIX) 40 MG tablet TAKE 1 TABLET(40 MG) BY MOUTH TWICE DAILY 09/09/22   Plotnikov, Evie Lacks, MD    Family History Family History  Problem Relation Age of Onset   Stroke Mother 29   Hypertension Mother    Sudden death Mother    Anxiety disorder Mother    Lung cancer Father 28   Colon cancer Neg Hx    Esophageal cancer Neg Hx    Stomach cancer Neg Hx    Rectal cancer Neg Hx     Social History Social History   Tobacco Use   Smoking status: Never   Smokeless tobacco: Never  Vaping Use   Vaping Use: Never used  Substance Use Topics   Alcohol use: No   Drug use: No     Allergies   Benazepril hcl, Levofloxacin, and Mobic [meloxicam]   Review of Systems Review of Systems See HPI  Physical Exam Triage Vital Signs ED Triage Vitals [01/26/23 1842]  Enc Vitals Group     BP (!) 169/103     Pulse Rate (!) 108     Resp 16     Temp 99 F (37.2 C)     Temp Source Oral     SpO2 98 %     Weight      Height      Head Circumference      Peak Flow      Pain Score      Pain Loc      Pain Edu?      Excl. in [place]?    No data found.  Updated Vital Signs BP (!) 169/103 (BP Location: Left Arm)   Pulse (!) 108   Temp 99 F (37.2 C) (Oral)   Resp 16   SpO2 98%      Physical Exam Constitutional:      General: She is  not in acute distress.    Appearance: She is well-developed. She is obese.  HENT:      Head: Normocephalic and atraumatic.     Right Ear: Tympanic membrane and ear canal normal.     Left Ear: Tympanic membrane and ear canal normal.     Nose: Congestion and rhinorrhea present.     Comments: Nasal membranes swollen and red.  Clear rhinorrhea.  No sinus tenderness.    Mouth/Throat:     Pharynx: No posterior oropharyngeal erythema.  Eyes:     Conjunctiva/sclera: Conjunctivae normal.     Pupils: Pupils are equal, round, and reactive to light.  Cardiovascular:     Rate and Rhythm: Normal rate and regular rhythm.     Heart sounds: Normal heart sounds.  Pulmonary:     Effort: Pulmonary effort is normal. No respiratory distress.     Breath sounds: Normal breath sounds.  Abdominal:     General: There is no distension.     Palpations: Abdomen is soft.  Musculoskeletal:        General: Normal range of motion.     Cervical back: Normal range of motion.  Lymphadenopathy:     Cervical: No cervical adenopathy.  Skin:    General: Skin is warm and dry.  Neurological:     Mental Status: She is alert.      UC Treatments / Results  Labs (all labs ordered are listed, but only abnormal results are displayed) Labs Reviewed - No data to display  EKG   Radiology No results found.  Procedures Procedures (including critical care time)  Medications Ordered in UC Medications - No data to display  Initial Impression / Assessment and Plan / UC Course  I have reviewed the triage vital signs and the nursing notes.  Pertinent labs & imaging results that were available during my care of the patient were reviewed by me and considered in my medical decision making (see chart for details).     I explained to the patient that by coming early, she is still really just treating the allergy symptoms and some cold symptoms.  No need for any antibiotics unless she fails to improve or has worsening symptoms.  I did give her a prescription for amoxicillin to fill and use in case needed.   Prednisone to try to help control the nasal congestion.  Follow-up with primary care Final Clinical Impressions(s) / UC Diagnoses   Final diagnoses:  Seasonal allergies  Nasal congestion     Discharge Instructions      Make sure you are drinking lots of water Continue Allegra every day Use your Atrovent as needed for runny nose Add prednisone 2 times a day.  This will help control the allergy symptoms  If you get worse instead of better over the next few days, I gave you a prescription for amoxicillin to fill and take   ED Prescriptions     Medication Sig Dispense Auth. Provider   predniSONE (DELTASONE) 20 MG tablet Take 1 tablet (20 mg total) by mouth 2 (two) times daily with a meal. 10 tablet Raylene Everts, MD   amoxicillin (AMOXIL) 500 MG capsule Take 2 capsules (1,000 mg total) by mouth 2 (two) times daily. 14 capsule Raylene Everts, MD      PDMP not reviewed this encounter.   Raylene Everts, MD 01/26/23 [phone]

## 2023-02-01 ENCOUNTER — Ambulatory Visit: Payer: Medicare Other | Admitting: Obstetrics and Gynecology

## 2023-02-13 DIAGNOSIS — H35373 Puckering of macula, bilateral: Secondary | ICD-10-CM | POA: Diagnosis not present

## 2023-02-13 DIAGNOSIS — H401111 Primary open-angle glaucoma, right eye, mild stage: Secondary | ICD-10-CM | POA: Diagnosis not present

## 2023-02-13 DIAGNOSIS — H401122 Primary open-angle glaucoma, left eye, moderate stage: Secondary | ICD-10-CM | POA: Diagnosis not present

## 2023-02-13 DIAGNOSIS — H26492 Other secondary cataract, left eye: Secondary | ICD-10-CM | POA: Diagnosis not present

## 2023-02-13 DIAGNOSIS — H2511 Age-related nuclear cataract, right eye: Secondary | ICD-10-CM | POA: Diagnosis not present

## 2023-02-13 DIAGNOSIS — Z961 Presence of intraocular lens: Secondary | ICD-10-CM | POA: Diagnosis not present

## 2023-02-22 ENCOUNTER — Other Ambulatory Visit: Payer: Self-pay | Admitting: Internal Medicine

## 2023-03-02 ENCOUNTER — Ambulatory Visit (INDEPENDENT_AMBULATORY_CARE_PROVIDER_SITE_OTHER): Payer: Medicare Other | Admitting: Obstetrics and Gynecology

## 2023-03-02 ENCOUNTER — Encounter: Payer: Self-pay | Admitting: Obstetrics and Gynecology

## 2023-03-02 VITALS — BP 110/70 | HR 78 | Wt 198.0 lb

## 2023-03-02 DIAGNOSIS — N952 Postmenopausal atrophic vaginitis: Secondary | ICD-10-CM

## 2023-03-02 DIAGNOSIS — Z4689 Encounter for fitting and adjustment of other specified devices: Secondary | ICD-10-CM

## 2023-03-02 NOTE — Progress Notes (Signed)
GYNECOLOGY  VISIT   HPI: 77 y.o.   Married White or Caucasian Not Hispanic or Latino  female   [phone] with No LMP recorded. Patient is postmenopausal.   here for pessary check. She has a #4 ring pessary with support. Last month she had some vaginal irritation which healed with a 2 week pessary break and starting of vaginal estrogen. She is here for a 1 month f/u.  GYNECOLOGIC HISTORY: No LMP recorded. Patient is postmenopausal. Contraception:pmp  Menopausal hormone therapy: estradiol cream        OB History     Gravida  2   Para  2   Term  2   Preterm      AB      Living  2      SAB      IAB      Ectopic      Multiple      Live Births  2              Patient Active Problem List   Diagnosis Date Noted   Uterine prolapse 07/07/2021   Edema 04/20/2020   Cough 01/13/2020   History of COVID-19 12/25/2019   Syncope and collapse 12/25/2019   Dysuria 04/25/2019   Insulin resistance 01/14/2019   Prediabetes 01/08/2018   Other fatigue 12/25/2017   Shortness of breath on exertion 12/25/2017   Hyperglycemia 12/25/2017   Class 1 obesity with serious comorbidity and body mass index (BMI) of 32.0 to 32.9 in adult 12/25/2017   Low back pain 11/21/2016   Rash and nonspecific skin eruption 11/10/2014   Left shoulder pain 11/18/2013   Nonspecific abnormal electrocardiogram (ECG) (EKG) 11/18/2013   Dizziness 11/18/2013   Well adult exam 11/12/2012   Anxiety 11/12/2012   Hypertension 11/07/2011   Insomnia 11/07/2011   PHARYNGITIS 08/31/2009   Vitamin D deficiency 09/01/2008   ANEMIA-IRON DEFICIENCY 09/01/2008   Allergic rhinitis 09/01/2008   GERD 09/01/2008   ADENOMATOUS COLONIC POLYP 12/05/2007   HEMORRHOIDS, INTERNAL 12/05/2007   Hiatal hernia 12/05/2007   DIVERTICULOSIS, COLON 12/05/2007   Osteopenia 12/05/2007    Past Medical History:  Diagnosis Date   Adenomatous polyp of colon 2004   Allergic rhinitis    Anemia    Anxiety    Back pain    Bladder  prolapse, female, acquired    Cataract 2018   Diverticulosis    GERD (gastroesophageal reflux disease)    Glaucoma    Hypertension    Lumbar compression fracture ([place]) 09/07/2016   x 2    Menopause    Urinary incontinence    Vitamin D deficiency     Past Surgical History:  Procedure Laterality Date   CATARACT EXTRACTION, BILATERAL  04/06/2017   with cypass stent implanted   COLONOSCOPY     cypess stent left eye Left 2018   POLYPECTOMY      Current Outpatient Medications  Medication Sig Dispense Refill   acetaminophen (TYLENOL) 500 MG tablet Take 1,000 mg by mouth every 6 (six) hours as needed for mild pain.     ALPRAZolam (XANAX) 0.25 MG tablet TAKE 1 TABLET(0.25 MG) BY MOUTH TWICE DAILY AS NEEDED FOR ANXIETY 60 tablet 3   amLODipine (NORVASC) 2.5 MG tablet Take 1 tablet (2.5 mg total) by mouth daily. 90 tablet 3   amoxicillin (AMOXIL) 500 MG capsule Take 2 capsules (1,000 mg total) by mouth 2 (two) times daily. 14 capsule 0   dorzolamide-timolol (COSOPT) 22.3-6.8 MG/ML ophthalmic solution 1 drop  2 (two) times daily.     estradiol (ESTRACE) 0.1 MG/GM vaginal cream 1 gram vaginally every night for one week, then change to twice weekly at hs 42.5 g 1   fexofenadine (ALLEGRA) 180 MG tablet Take 180 mg by mouth daily.     ipratropium (ATROVENT) 0.06 % nasal spray Place 2 sprays into the nose 3 (three) times daily. Annual appt due must see provider for future refills 45 mL 3   irbesartan (AVAPRO) 150 MG tablet TAKE 1 TABLET(150 MG) BY MOUTH DAILY 90 tablet 3   IRON PO Take 65 mg by mouth daily.     latanoprost (XALATAN) 0.005 % ophthalmic solution INT 1 GTT IN OU HS     pantoprazole (PROTONIX) 40 MG tablet TAKE 1 TABLET(40 MG) BY MOUTH TWICE DAILY 180 tablet 3   predniSONE (DELTASONE) 20 MG tablet Take 1 tablet (20 mg total) by mouth 2 (two) times daily with a meal. 10 tablet 0   VITAMIN D PO Take 1,000 Units by mouth daily.     No current facility-administered medications for this  visit.     ALLERGIES: Benazepril hcl, Levofloxacin, and Mobic [meloxicam]  Family History  Problem Relation Age of Onset   Stroke Mother 82   Hypertension Mother    Sudden death Mother    Anxiety disorder Mother    Lung cancer Father 73   Colon cancer Neg Hx    Esophageal cancer Neg Hx    Stomach cancer Neg Hx    Rectal cancer Neg Hx     Social History   Socioeconomic History   Marital status: Married    Spouse name: Mortimer Fries "[place]" [place]   Number of children: 2   Years of education: Not on file   Highest education level: Not on file  Occupational History   Occupation: Hair dresser  Tobacco Use   Smoking status: Never   Smokeless tobacco: Never  Vaping Use   Vaping Use: Never used  Substance and Sexual Activity   Alcohol use: No   Drug use: No   Sexual activity: Yes    Partners: Male    Birth control/protection: Post-menopausal  Other Topics Concern   Not on file  Social History Narrative   Not on file   Social Determinants of Health   Financial Resource Strain: Low Risk  (06/27/2022)   Overall Financial Resource Strain (CARDIA)    Difficulty of Paying Living Expenses: Not hard at all  Food Insecurity: No Food Insecurity (06/27/2022)   Hunger Vital Sign    Worried About Running Out of Food in the Last Year: Never true    [place] in the Last Year: Never true  Transportation Needs: Unknown (06/27/2022)   PRAPARE - Hydrologist (Medical): No    Lack of Transportation (Non-Medical): Not on file  Physical Activity: Sufficiently Active (06/27/2022)   Exercise Vital Sign    Days of Exercise per Week: 5 days    Minutes of Exercise per Session: 60 min  Stress: No Stress Concern Present (06/27/2022)   [place]    Feeling of Stress : Not at all  Social Connections: Moderately Integrated (06/27/2022)   Social Connection and Isolation Panel [NHANES]    Frequency of  Communication with Friends and Family: More than three times a week    Frequency of Social Gatherings with Friends and Family: Three times a week    Attends Religious Services:  More than 4 times per year    Active Member of Clubs or Organizations: No    Attends Archivist Meetings: Never    Marital Status: Married  Human resources officer Violence: Not At Risk (06/27/2022)   Humiliation, Afraid, Rape, and Kick questionnaire    Fear of Current or Ex-Partner: No    Emotionally Abused: No    Physically Abused: No    Sexually Abused: No    Review of Systems  All other systems reviewed and are negative.   PHYSICAL EXAMINATION:    BP 110/70   Pulse 78   Wt 198 lb (89.8 kg)   SpO2 100%   BMI 35.07 kg/m     General appearance: alert, cooperative and appears stated age   Pelvic: External genitalia:  no lesions              Urethra:  normal appearing urethra with no masses, tenderness or lesions              Bartholins and Skenes: normal                 Vagina: the pessary was removed and cleaned, no vaginal irritation. 1 gram of estrace cream placed vaginally and the pessary was reinserted.               Cervix: no lesions               Chaperone was present for exam.

## 2023-03-13 ENCOUNTER — Ambulatory Visit (INDEPENDENT_AMBULATORY_CARE_PROVIDER_SITE_OTHER): Payer: Medicare Other | Admitting: Internal Medicine

## 2023-03-13 ENCOUNTER — Encounter: Payer: Self-pay | Admitting: Internal Medicine

## 2023-03-13 VITALS — BP 118/70 | HR 58 | Temp 98.1°F | Ht 63.0 in | Wt 195.0 lb

## 2023-03-13 DIAGNOSIS — F419 Anxiety disorder, unspecified: Secondary | ICD-10-CM

## 2023-03-13 DIAGNOSIS — E88819 Insulin resistance, unspecified: Secondary | ICD-10-CM

## 2023-03-13 DIAGNOSIS — E785 Hyperlipidemia, unspecified: Secondary | ICD-10-CM | POA: Diagnosis not present

## 2023-03-13 DIAGNOSIS — K219 Gastro-esophageal reflux disease without esophagitis: Secondary | ICD-10-CM

## 2023-03-13 DIAGNOSIS — I10 Essential (primary) hypertension: Secondary | ICD-10-CM | POA: Diagnosis not present

## 2023-03-13 DIAGNOSIS — R7303 Prediabetes: Secondary | ICD-10-CM | POA: Diagnosis not present

## 2023-03-13 LAB — CBC WITH DIFFERENTIAL/PLATELET
Basophils Absolute: 0.1 10*3/uL (ref 0.0–0.1)
Basophils Relative: 1 % (ref 0.0–3.0)
Eosinophils Absolute: 0.2 10*3/uL (ref 0.0–0.7)
Eosinophils Relative: 3.1 % (ref 0.0–5.0)
HCT: 42.5 % (ref 36.0–46.0)
Hemoglobin: 14.1 g/dL (ref 12.0–15.0)
Lymphocytes Relative: 21.1 % (ref 12.0–46.0)
Lymphs Abs: 1.4 10*3/uL (ref 0.7–4.0)
MCHC: 33.2 g/dL (ref 30.0–36.0)
MCV: 92.8 fl (ref 78.0–100.0)
Monocytes Absolute: 0.5 10*3/uL (ref 0.1–1.0)
Monocytes Relative: 7.3 % (ref 3.0–12.0)
Neutro Abs: 4.4 10*3/uL (ref 1.4–7.7)
Neutrophils Relative %: 67.5 % (ref 43.0–77.0)
Platelets: 223 10*3/uL (ref 150.0–400.0)
RBC: 4.58 Mil/uL (ref 3.87–5.11)
RDW: 13.6 % (ref 11.5–15.5)
WBC: 6.5 10*3/uL (ref 4.0–10.5)

## 2023-03-13 LAB — COMPREHENSIVE METABOLIC PANEL
ALT: 10 U/L (ref 0–35)
AST: 16 U/L (ref 0–37)
Albumin: 4.2 g/dL (ref 3.5–5.2)
Alkaline Phosphatase: 53 U/L (ref 39–117)
BUN: 14 mg/dL (ref 6–23)
CO2: 27 mEq/L (ref 19–32)
Calcium: 9 mg/dL (ref 8.4–10.5)
Chloride: 106 mEq/L (ref 96–112)
Creatinine, Ser: 0.84 mg/dL (ref 0.40–1.20)
GFR: 67.85 mL/min (ref >60.00)
Glucose, Bld: 88 mg/dL (ref 70–99)
Potassium: 4.1 mEq/L (ref 3.5–5.1)
Sodium: 141 mEq/L (ref 135–145)
Total Bilirubin: 0.5 mg/dL (ref 0.2–1.2)
Total Protein: 6.8 g/dL (ref 6.0–8.3)

## 2023-03-13 LAB — LIPID PANEL
Cholesterol: 192 mg/dL (ref 0–200)
HDL: 58.8 mg/dL (ref >39.00)
LDL Cholesterol: 115 mg/dL — ABNORMAL HIGH (ref 0–99)
NonHDL: 133.21
Total CHOL/HDL Ratio: 3
Triglycerides: 90 mg/dL (ref 0.0–149.0)
VLDL: 18 mg/dL (ref 0.0–40.0)

## 2023-03-13 LAB — TSH: TSH: 0.93 u[IU]/mL (ref 0.35–5.50)

## 2023-03-13 MED ORDER — AMLODIPINE BESYLATE 2.5 MG PO TABS: 2.5000 mg | ORAL_TABLET | Freq: Every day | ORAL | 3 refills | Status: DC

## 2023-03-13 MED ORDER — ALPRAZOLAM 0.25 MG PO TABS
ORAL_TABLET | ORAL | 1 refills | Status: DC
Start: 2023-03-13 — End: 2023-10-09

## 2023-03-13 MED ORDER — IRBESARTAN 150 MG PO TABS: ORAL_TABLET | ORAL | 3 refills | Status: DC

## 2023-03-13 MED ORDER — PANTOPRAZOLE SODIUM 40 MG PO TBEC: DELAYED_RELEASE_TABLET | ORAL | 3 refills | Status: DC

## 2023-03-13 NOTE — Assessment & Plan Note (Signed)
Check glucose 

## 2023-03-13 NOTE — Assessment & Plan Note (Signed)
On Protonix bid 

## 2023-03-13 NOTE — Assessment & Plan Note (Signed)
We cont on Amlodipine 2.5 mg/d and Irbesartan 150 mg/d

## 2023-03-13 NOTE — Assessment & Plan Note (Signed)
Continue on Xanax prn  Potential benefits of a long term benzodiazepines  use as well as potential risks  and complications were explained to the patient and were aknowledged. 

## 2023-03-13 NOTE — Progress Notes (Signed)
Subjective:  Patient ID: Marguerite Olea, female    DOB: January 09, 1946  Age: 77 y.o. MRN: [id number]  CC: No chief complaint on file.   HPI HEATHERANN ADCOCK presents for anxiety, HTN, allergies, GERD  Outpatient Medications Prior to Visit  Medication Sig Dispense Refill   acetaminophen (TYLENOL) 500 MG tablet Take 1,000 mg by mouth every 6 (six) hours as needed for mild pain.     dorzolamide-timolol (COSOPT) 22.3-6.8 MG/ML ophthalmic solution 1 drop 2 (two) times daily.     estradiol (ESTRACE) 0.1 MG/GM vaginal cream 1 gram vaginally every night for one week, then change to twice weekly at hs 42.5 g 1   fexofenadine (ALLEGRA) 180 MG tablet Take 180 mg by mouth daily.     ipratropium (ATROVENT) 0.06 % nasal spray Place 2 sprays into the nose 3 (three) times daily. Annual appt due must see provider for future refills 45 mL 3   IRON PO Take 65 mg by mouth daily.     latanoprost (XALATAN) 0.005 % ophthalmic solution INT 1 GTT IN OU HS     VITAMIN D PO Take 1,000 Units by mouth daily.     ALPRAZolam (XANAX) 0.25 MG tablet TAKE 1 TABLET(0.25 MG) BY MOUTH TWICE DAILY AS NEEDED FOR ANXIETY 60 tablet 3   amLODipine (NORVASC) 2.5 MG tablet Take 1 tablet (2.5 mg total) by mouth daily. 90 tablet 3   irbesartan (AVAPRO) 150 MG tablet TAKE 1 TABLET(150 MG) BY MOUTH DAILY 90 tablet 3   pantoprazole (PROTONIX) 40 MG tablet TAKE 1 TABLET(40 MG) BY MOUTH TWICE DAILY 180 tablet 3   amoxicillin (AMOXIL) 500 MG capsule Take 2 capsules (1,000 mg total) by mouth 2 (two) times daily. 14 capsule 0   predniSONE (DELTASONE) 20 MG tablet Take 1 tablet (20 mg total) by mouth 2 (two) times daily with a meal. 10 tablet 0   No facility-administered medications prior to visit.    ROS: Review of Systems  Constitutional:  Negative for activity change, appetite change, chills, fatigue and unexpected weight change.  HENT:  Negative for congestion, mouth sores and sinus pressure.   Eyes:  Negative for visual disturbance.   Respiratory:  Negative for cough and chest tightness.   Gastrointestinal:  Negative for abdominal pain and nausea.  Genitourinary:  Negative for difficulty urinating, frequency and vaginal pain.  Musculoskeletal:  Negative for back pain and gait problem.  Skin:  Negative for pallor and rash.  Neurological:  Negative for dizziness, tremors, weakness, numbness and headaches.  Psychiatric/Behavioral:  Negative for confusion and sleep disturbance. The patient is nervous/anxious.     Objective:  BP 118/70 (BP Location: Left Arm, Patient Position: Sitting, Cuff Size: Normal)   Pulse (!) 58   Temp 98.1 F (36.7 C) (Oral)   Ht 5\' 3"  (1.6 m)   Wt 195 lb (88.5 kg)   SpO2 100%   BMI 34.54 kg/m   BP Readings from Last 3 Encounters:  03/13/23 118/70  03/02/23 110/70  01/26/23 (!) 169/103    Wt Readings from Last 3 Encounters:  03/13/23 195 lb (88.5 kg)  03/02/23 198 lb (89.8 kg)  01/25/23 199 lb (90.3 kg)    Physical Exam Constitutional:      General: She is not in acute distress.    Appearance: She is well-developed. She is obese.  HENT:     Head: Normocephalic.     Right Ear: External ear normal.     Left Ear: External ear normal.  Nose: Nose normal.  Eyes:     General:        Right eye: No discharge.        Left eye: No discharge.     Conjunctiva/sclera: Conjunctivae normal.     Pupils: Pupils are equal, round, and reactive to light.  Neck:     Thyroid: No thyromegaly.     Vascular: No JVD.     Trachea: No tracheal deviation.  Cardiovascular:     Rate and Rhythm: Normal rate and regular rhythm.     Heart sounds: Normal heart sounds.  Pulmonary:     Effort: No respiratory distress.     Breath sounds: No stridor. No wheezing.  Abdominal:     General: Bowel sounds are normal. There is no distension.     Palpations: Abdomen is soft. There is no mass.     Tenderness: There is no abdominal tenderness. There is no guarding or rebound.  Musculoskeletal:         General: No tenderness.     Cervical back: Normal range of motion and neck supple. No rigidity.  Lymphadenopathy:     Cervical: No cervical adenopathy.  Skin:    Findings: No erythema or rash.  Neurological:     Mental Status: She is oriented to person, place, and time.     Cranial Nerves: No cranial nerve deficit.     Motor: No abnormal muscle tone.     Coordination: Coordination normal.     Deep Tendon Reflexes: Reflexes normal.  Psychiatric:        Behavior: Behavior normal.        Thought Content: Thought content normal.        Judgment: Judgment normal.     Lab Results  Component Value Date   WBC 5.3 08/30/2021   HGB 13.4 08/30/2021   HCT 40.6 08/30/2021   PLT 198.0 08/30/2021   GLUCOSE 91 02/28/2022   CHOL 166 08/30/2021   TRIG 85.0 08/30/2021   HDL 58.00 08/30/2021   LDLCALC 91 08/30/2021   ALT 9 02/28/2022   AST 16 02/28/2022   NA 139 02/28/2022   K 4.4 02/28/2022   CL 105 02/28/2022   CREATININE 0.86 02/28/2022   BUN 16 02/28/2022   CO2 30 02/28/2022   TSH 0.91 08/30/2021   HGBA1C 5.7 10/21/2019    No results found.  Assessment & Plan:   Problem List Items Addressed This Visit       Cardiovascular and Mediastinum   Hypertension - Primary    We cont on Amlodipine 2.5 mg/d and Irbesartan 150 mg/d      Relevant Medications   irbesartan (AVAPRO) 150 MG tablet   amLODipine (NORVASC) 2.5 MG tablet     Digestive   GERD    On Protonix bid      Relevant Medications   pantoprazole (PROTONIX) 40 MG tablet     Endocrine   Insulin resistance    Check glucose        Other   Anxiety    Continue on Xanax prn  Potential benefits of a long term benzodiazepines  use as well as potential risks  and complications were explained to the patient and were aknowledged.      Relevant Medications   ALPRAZolam (XANAX) 0.25 MG tablet   Other Visit Diagnoses     Gastroesophageal reflux disease       Relevant Medications   pantoprazole (PROTONIX) 40 MG  tablet  Meds ordered this encounter  Medications   pantoprazole (PROTONIX) 40 MG tablet    Sig: TAKE 1 TABLET(40 MG) BY MOUTH TWICE DAILY    Dispense:  180 tablet    Refill:  3   irbesartan (AVAPRO) 150 MG tablet    Sig: TAKE 1 TABLET(150 MG) BY MOUTH DAILY    Dispense:  90 tablet    Refill:  3   amLODipine (NORVASC) 2.5 MG tablet    Sig: Take 1 tablet (2.5 mg total) by mouth daily.    Dispense:  90 tablet    Refill:  3   ALPRAZolam (XANAX) 0.25 MG tablet    Sig: TAKE 1 TABLET(0.25 MG) BY MOUTH TWICE DAILY AS NEEDED FOR ANXIETY    Dispense:  180 tablet    Refill:  1      Follow-up: Return in about 6 months (around 09/12/2023) for Wellness Exam.  Sonda PrimesAlex Harvest Deist, MD

## 2023-04-06 DIAGNOSIS — H401111 Primary open-angle glaucoma, right eye, mild stage: Secondary | ICD-10-CM | POA: Diagnosis not present

## 2023-04-06 DIAGNOSIS — H2511 Age-related nuclear cataract, right eye: Secondary | ICD-10-CM | POA: Diagnosis not present

## 2023-04-27 ENCOUNTER — Telehealth: Payer: Self-pay

## 2023-04-27 NOTE — Telephone Encounter (Signed)
Prolia VOB initiated via [email or web address]  Last Prolia inj: 11/30/22 Next Prolia inj DUE: 05/30/23

## 2023-05-02 NOTE — Telephone Encounter (Signed)
Pt ready for scheduling for PROLIA on or after : 05/30/23  Out-of-pocket cost due at time of visit: $0  Primary: MEDICARE Prolia co-insurance: 0% Admin fee co-insurance: 0%  Secondary: BCBS[place]-MED[place]P Prolia co-insurance:  Admin fee co-insurance:   Medical Benefit Details: Date Benefits were checked: 04/28/23 Deductible: $240 met of $240 required/ Coinsurance: 0%/ Admin Fee: 0%  Prior Auth: N/A PA# Expiration Date:    Pharmacy benefit: Copay $--- If patient wants fill through the pharmacy benefit please send prescription to:  --- , and include estimated need by date in rx notes. Pharmacy will ship medication directly to the office.  Patient NOT eligible for Prolia Copay Card. Copay Card can make patient's cost as little as $25. Link to apply: [email or web address]://www.amgensupportplus.[email or web address]/copay  ** This summary of benefits is an estimation of the patient's out-of-pocket cost. Exact cost may very based on individual plan coverage.

## 2023-05-31 ENCOUNTER — Ambulatory Visit (INDEPENDENT_AMBULATORY_CARE_PROVIDER_SITE_OTHER): Payer: Medicare Other | Admitting: Obstetrics and Gynecology

## 2023-05-31 ENCOUNTER — Encounter: Payer: Self-pay | Admitting: Obstetrics and Gynecology

## 2023-05-31 VITALS — BP 128/74 | HR 68 | Wt 197.0 lb

## 2023-05-31 DIAGNOSIS — N898 Other specified noninflammatory disorders of vagina: Secondary | ICD-10-CM

## 2023-05-31 DIAGNOSIS — Z4689 Encounter for fitting and adjustment of other specified devices: Secondary | ICD-10-CM | POA: Diagnosis not present

## 2023-05-31 DIAGNOSIS — N952 Postmenopausal atrophic vaginitis: Secondary | ICD-10-CM | POA: Diagnosis not present

## 2023-05-31 DIAGNOSIS — T8389XA Other specified complication of genitourinary prosthetic devices, implants and grafts, initial encounter: Secondary | ICD-10-CM | POA: Diagnosis not present

## 2023-05-31 LAB — WET PREP FOR TRICH, YEAST, CLUE

## 2023-05-31 MED ORDER — ESTRADIOL 0.1 MG/GM VA CREA: TOPICAL_CREAM | VAGINAL | 1 refills | Status: DC

## 2023-05-31 NOTE — Addendum Note (Signed)
Addended by: Tobi Bastos on: 05/31/2023 04:11 PM   Modules accepted: Orders

## 2023-05-31 NOTE — Progress Notes (Addendum)
GYNECOLOGY  VISIT   HPI: 77 y.o.   Married White or Caucasian Not Hispanic or Latino  female   [phone] with No LMP recorded. Patient is postmenopausal.   here for  pessary maintenance. She has a #4 ring pessary with support. Using vaginal estrogen 2 x a week. No vaginal bleeding.  Occasionally sexually active, no pain.  No bowel c/o.   Occasional urge incontinence, tolerable. Occurs a couple of times a week, leaks a very small amount.   GYNECOLOGIC HISTORY: No LMP recorded. Patient is postmenopausal. Contraception:pmp  Menopausal hormone therapy: estradiol cream.         OB History     Gravida  2   Para  2   Term  2   Preterm      AB      Living  2      SAB      IAB      Ectopic      Multiple      Live Births  2              Patient Active Problem List   Diagnosis Date Noted   Uterine prolapse 07/07/2021   Edema 04/20/2020   Cough 01/13/2020   History of COVID-19 12/25/2019   Syncope and collapse 12/25/2019   Dysuria 04/25/2019   Insulin resistance 01/14/2019   Prediabetes 01/08/2018   Other fatigue 12/25/2017   Shortness of breath on exertion 12/25/2017   Hyperglycemia 12/25/2017   Class 1 obesity with serious comorbidity and body mass index (BMI) of 32.0 to 32.9 in adult 12/25/2017   Low back pain 11/21/2016   Rash and nonspecific skin eruption 11/10/2014   Left shoulder pain 11/18/2013   Nonspecific abnormal electrocardiogram (ECG) (EKG) 11/18/2013   Dizziness 11/18/2013   Well adult exam 11/12/2012   Anxiety 11/12/2012   Hypertension 11/07/2011   Insomnia 11/07/2011   PHARYNGITIS 08/31/2009   Vitamin D deficiency 09/01/2008   ANEMIA-IRON DEFICIENCY 09/01/2008   Allergic rhinitis 09/01/2008   Gastroesophageal reflux disease 09/01/2008   ADENOMATOUS COLONIC POLYP 12/05/2007   HEMORRHOIDS, INTERNAL 12/05/2007   Hiatal hernia 12/05/2007   DIVERTICULOSIS, COLON 12/05/2007   Osteopenia 12/05/2007    Past Medical History:  Diagnosis  Date   Adenomatous polyp of colon 2004   Allergic rhinitis    Anemia    Anxiety    Back pain    Bladder prolapse, female, acquired    Cataract 2018   Diverticulosis    GERD (gastroesophageal reflux disease)    Glaucoma    Hypertension    Lumbar compression fracture (HCC) 09/07/2016   x 2    Menopause    Urinary incontinence    Vitamin D deficiency     Past Surgical History:  Procedure Laterality Date   CATARACT EXTRACTION, BILATERAL  04/06/2017   with cypass stent implanted   COLONOSCOPY     cypess stent left eye Left 2018   POLYPECTOMY      Current Outpatient Medications  Medication Sig Dispense Refill   acetaminophen (TYLENOL) 500 MG tablet Take 1,000 mg by mouth every 6 (six) hours as needed for mild pain.     ALPRAZolam (XANAX) 0.25 MG tablet TAKE 1 TABLET(0.25 MG) BY MOUTH TWICE DAILY AS NEEDED FOR ANXIETY 180 tablet 1   amLODipine (NORVASC) 2.5 MG tablet Take 1 tablet (2.5 mg total) by mouth daily. 90 tablet 3   dorzolamide-timolol (COSOPT) 22.3-6.8 MG/ML ophthalmic solution 1 drop 2 (two) times daily.  estradiol (ESTRACE) 0.1 MG/GM vaginal cream 1 gram vaginally every night for one week, then change to twice weekly at hs 42.5 g 1   fexofenadine (ALLEGRA) 180 MG tablet Take 180 mg by mouth daily.     ipratropium (ATROVENT) 0.06 % nasal spray Place 2 sprays into the nose 3 (three) times daily. Annual appt due must see provider for future refills 45 mL 3   irbesartan (AVAPRO) 150 MG tablet TAKE 1 TABLET(150 MG) BY MOUTH DAILY 90 tablet 3   IRON PO Take 65 mg by mouth daily.     latanoprost (XALATAN) 0.005 % ophthalmic solution INT 1 GTT IN OU HS     pantoprazole (PROTONIX) 40 MG tablet TAKE 1 TABLET(40 MG) BY MOUTH TWICE DAILY 180 tablet 3   VITAMIN D PO Take 1,000 Units by mouth daily.     No current facility-administered medications for this visit.     ALLERGIES: Benazepril hcl, Levofloxacin, and Mobic [meloxicam]  Family History  Problem Relation Age of  Onset   Stroke Mother 42   Hypertension Mother    Sudden death Mother    Anxiety disorder Mother    Lung cancer Father 74   Colon cancer Neg Hx    Esophageal cancer Neg Hx    Stomach cancer Neg Hx    Rectal cancer Neg Hx     Social History   Socioeconomic History   Marital status: Married    Spouse name: Reita Cliche "[place]" [place]   Number of children: 2   Years of education: Not on file   Highest education level: Not on file  Occupational History   Occupation: Hair dresser  Tobacco Use   Smoking status: Never   Smokeless tobacco: Never  Vaping Use   Vaping Use: Never used  Substance and Sexual Activity   Alcohol use: No   Drug use: No   Sexual activity: Yes    Partners: Male    Birth control/protection: Post-menopausal  Other Topics Concern   Not on file  Social History Narrative   Not on file   Social Determinants of Health   Financial Resource Strain: Low Risk  (06/27/2022)   Overall Financial Resource Strain (CARDIA)    Difficulty of Paying Living Expenses: Not hard at all  Food Insecurity: No Food Insecurity (06/27/2022)   Hunger Vital Sign    Worried About Running Out of Food in the Last Year: Never true    Ran Out of Food in the Last Year: Never true  Transportation Needs: Unknown (06/27/2022)   PRAPARE - Administrator, Civil Service (Medical): No    Lack of Transportation (Non-Medical): Not on file  Physical Activity: Sufficiently Active (06/27/2022)   Exercise Vital Sign    Days of Exercise per Week: 5 days    Minutes of Exercise per Session: 60 min  Stress: No Stress Concern Present (06/27/2022)   [employer] of Occupational Health - Occupational Stress Questionnaire    Feeling of Stress : Not at all  Social Connections: Moderately Integrated (06/27/2022)   Social Connection and Isolation Panel [NHANES]    Frequency of Communication with Friends and Family: More than three times a week    Frequency of Social Gatherings with Friends and  Family: Three times a week    Attends Religious Services: More than 4 times per year    Active Member of Clubs or Organizations: No    Attends Banker Meetings: Never    Marital Status: Married  Intimate  Partner Violence: Not At Risk (06/27/2022)   Humiliation, Afraid, Rape, and Kick questionnaire    Fear of Current or Ex-Partner: No    Emotionally Abused: No    Physically Abused: No    Sexually Abused: No    Review of Systems  All other systems reviewed and are negative.   PHYSICAL EXAMINATION:    There were no vitals taken for this visit.    General appearance: alert, cooperative and appears stated age  Pelvic: External genitalia:  no lesions              Urethra:  normal appearing urethra with no masses, tenderness or lesions              Bartholins and Skenes: normal                 Vagina: the pessary was removed and cleaned, feels like it may be too small. Some vaginal irritation on the posterior vaginal fornix, slightly friable. Slight increase in white vaginal d/c. The pessary was left out              Cervix: no lesions               Chaperone was present for exam.  1. Pessary maintenance Overall very happy with the pessary, controls her prolapse well. The pessary feels a little small on exam today and she has some irritation. -will leave the pessary out -she will use 1 gram of estrogen cream every other day x 2 weeks, then f/u -consider trying a larger pessary -she is due for a breast and pelvic and will schedule it after her f/u visit  2. Vaginal irritation from pessary (HCC)  3. Vaginal atrophy - estradiol (ESTRACE) 0.1 MG/GM vaginal cream; 1 gram vaginally every night for one week, then change to twice weekly at hs  Dispense: 42.5 g; Refill: 1  Addendum: Wet prep done for increase vaginal d/c was negative for infection.

## 2023-05-31 NOTE — Patient Instructions (Signed)
use 1 gram of estrogen cream every other day x 2 weeks

## 2023-06-02 NOTE — Progress Notes (Deleted)
GYNECOLOGY  VISIT   HPI: 77 y.o.   Married  Caucasian  female   G2P2002 with No LMP recorded. Patient is postmenopausal.   here for   pessary   GYNECOLOGIC HISTORY: No LMP recorded. Patient is postmenopausal. Contraception:  PMP Menopausal hormone therapy:  estrogen cream Last mammogram:  03/12/20 Last pap smear:   ***        OB History     Gravida  2   Para  2   Term  2   Preterm      AB      Living  2      SAB      IAB      Ectopic      Multiple      Live Births  2              Patient Active Problem List   Diagnosis Date Noted   Uterine prolapse 07/07/2021   Edema 04/20/2020   Cough 01/13/2020   History of COVID-19 12/25/2019   Syncope and collapse 12/25/2019   Dysuria 04/25/2019   Insulin resistance 01/14/2019   Prediabetes 01/08/2018   Other fatigue 12/25/2017   Shortness of breath on exertion 12/25/2017   Hyperglycemia 12/25/2017   Class 1 obesity with serious comorbidity and body mass index (BMI) of 32.0 to 32.9 in adult 12/25/2017   Low back pain 11/21/2016   Rash and nonspecific skin eruption 11/10/2014   Left shoulder pain 11/18/2013   Nonspecific abnormal electrocardiogram (ECG) (EKG) 11/18/2013   Dizziness 11/18/2013   Well adult exam 11/12/2012   Anxiety 11/12/2012   Hypertension 11/07/2011   Insomnia 11/07/2011   PHARYNGITIS 08/31/2009   Vitamin D deficiency 09/01/2008   ANEMIA-IRON DEFICIENCY 09/01/2008   Allergic rhinitis 09/01/2008   Gastroesophageal reflux disease 09/01/2008   ADENOMATOUS COLONIC POLYP 12/05/2007   HEMORRHOIDS, INTERNAL 12/05/2007   Hiatal hernia 12/05/2007   DIVERTICULOSIS, COLON 12/05/2007   Osteopenia 12/05/2007    Past Medical History:  Diagnosis Date   Adenomatous polyp of colon 2004   Allergic rhinitis    Anemia    Anxiety    Back pain    Bladder prolapse, female, acquired    Cataract 2018   Diverticulosis    GERD (gastroesophageal reflux disease)    Glaucoma    Hypertension    Lumbar  compression fracture (HCC) 09/07/2016   x 2    Menopause    Urinary incontinence    Vitamin D deficiency     Past Surgical History:  Procedure Laterality Date   CATARACT EXTRACTION, BILATERAL  04/06/2017   with cypass stent implanted   COLONOSCOPY     cypess stent left eye Left 2018   POLYPECTOMY      Current Outpatient Medications  Medication Sig Dispense Refill   acetaminophen (TYLENOL) 500 MG tablet Take 1,000 mg by mouth every 6 (six) hours as needed for mild pain.     ALPRAZolam (XANAX) 0.25 MG tablet TAKE 1 TABLET(0.25 MG) BY MOUTH TWICE DAILY AS NEEDED FOR ANXIETY 180 tablet 1   amLODipine (NORVASC) 2.5 MG tablet Take 1 tablet (2.5 mg total) by mouth daily. 90 tablet 3   dorzolamide-timolol (COSOPT) 22.3-6.8 MG/ML ophthalmic solution 1 drop 2 (two) times daily.     estradiol (ESTRACE) 0.1 MG/GM vaginal cream 1 gram vaginally every night for one week, then change to twice weekly at hs 42.5 g 1   fexofenadine (ALLEGRA) 180 MG tablet Take 180 mg by mouth daily.  ipratropium (ATROVENT) 0.06 % nasal spray Place 2 sprays into the nose 3 (three) times daily. Annual appt due must see provider for future refills 45 mL 3   irbesartan (AVAPRO) 150 MG tablet TAKE 1 TABLET(150 MG) BY MOUTH DAILY 90 tablet 3   IRON PO Take 65 mg by mouth daily.     latanoprost (XALATAN) 0.005 % ophthalmic solution INT 1 GTT IN OU HS     pantoprazole (PROTONIX) 40 MG tablet TAKE 1 TABLET(40 MG) BY MOUTH TWICE DAILY 180 tablet 3   VITAMIN D PO Take 1,000 Units by mouth daily.     No current facility-administered medications for this visit.     ALLERGIES: Benazepril hcl, Levofloxacin, and Mobic [meloxicam]  Family History  Problem Relation Age of Onset   Stroke Mother 75   Hypertension Mother    Sudden death Mother    Anxiety disorder Mother    Lung cancer Father 37   Colon cancer Neg Hx    Esophageal cancer Neg Hx    Stomach cancer Neg Hx    Rectal cancer Neg Hx     Social History    Socioeconomic History   Marital status: Married    Spouse name: Reita Cliche "Chester" [place]   Number of children: 2   Years of education: Not on file   Highest education level: Not on file  Occupational History   Occupation: Hair dresser  Tobacco Use   Smoking status: Never   Smokeless tobacco: Never  Vaping Use   Vaping Use: Never used  Substance and Sexual Activity   Alcohol use: No   Drug use: No   Sexual activity: Yes    Partners: Male    Birth control/protection: Post-menopausal  Other Topics Concern   Not on file  Social History Narrative   Not on file   Social Determinants of Health   Financial Resource Strain: Low Risk  (06/27/2022)   Overall Financial Resource Strain (CARDIA)    Difficulty of Paying Living Expenses: Not hard at all  Food Insecurity: No Food Insecurity (06/27/2022)   Hunger Vital Sign    Worried About Running Out of Food in the Last Year: Never true    Ran Out of Food in the Last Year: Never true  Transportation Needs: Unknown (06/27/2022)   PRAPARE - Administrator, Civil Service (Medical): No    Lack of Transportation (Non-Medical): Not on file  Physical Activity: Sufficiently Active (06/27/2022)   Exercise Vital Sign    Days of Exercise per Week: 5 days    Minutes of Exercise per Session: 60 min  Stress: No Stress Concern Present (06/27/2022)   [employer] of Occupational Health - Occupational Stress Questionnaire    Feeling of Stress : Not at all  Social Connections: Moderately Integrated (06/27/2022)   Social Connection and Isolation Panel [NHANES]    Frequency of Communication with Friends and Family: More than three times a week    Frequency of Social Gatherings with Friends and Family: Three times a week    Attends Religious Services: More than 4 times per year    Active Member of Clubs or Organizations: No    Attends Banker Meetings: Never    Marital Status: Married  Catering manager Violence: Not At Risk  (06/27/2022)   Humiliation, Afraid, Rape, and Kick questionnaire    Fear of Current or Ex-Partner: No    Emotionally Abused: No    Physically Abused: No    Sexually Abused: No  Review of Systems  PHYSICAL EXAMINATION:    There were no vitals taken for this visit.    General appearance: alert, cooperative and appears stated age Head: Normocephalic, without obvious abnormality, atraumatic Neck: no adenopathy, supple, symmetrical, trachea midline and thyroid normal to inspection and palpation Lungs: clear to auscultation bilaterally Breasts: normal appearance, no masses or tenderness, No nipple retraction or dimpling, No nipple discharge or bleeding, No axillary or supraclavicular adenopathy Heart: regular rate and rhythm Abdomen: soft, non-tender, no masses,  no organomegaly Extremities: extremities normal, atraumatic, no cyanosis or edema Skin: Skin color, texture, turgor normal. No rashes or lesions Lymph nodes: Cervical, supraclavicular, and axillary nodes normal. No abnormal inguinal nodes palpated Neurologic: Grossly normal  Pelvic: External genitalia:  no lesions              Urethra:  normal appearing urethra with no masses, tenderness or lesions              Bartholins and Skenes: normal                 Vagina: normal appearing vagina with normal color and discharge, no lesions              Cervix: no lesions                Bimanual Exam:  Uterus:  normal size, contour, position, consistency, mobility, non-tender              Adnexa: no mass, fullness, tenderness              Rectal exam: {yes no:314532}.  Confirms.              Anus:  normal sphincter tone, no lesions  Chaperone was present for exam:  ***  ASSESSMENT     PLAN     An After Visit Summary was printed and given to the patient.  ______ minutes face to face time of which over 50% was spent in counseling.

## 2023-06-14 ENCOUNTER — Ambulatory Visit: Payer: Medicare Other | Admitting: Obstetrics and Gynecology

## 2023-06-28 ENCOUNTER — Encounter: Payer: Self-pay | Admitting: Nurse Practitioner

## 2023-06-28 ENCOUNTER — Ambulatory Visit (INDEPENDENT_AMBULATORY_CARE_PROVIDER_SITE_OTHER): Payer: Medicare Other | Admitting: Nurse Practitioner

## 2023-06-28 VITALS — BP 116/64 | HR 84 | Resp 16

## 2023-06-28 DIAGNOSIS — Z4689 Encounter for fitting and adjustment of other specified devices: Secondary | ICD-10-CM | POA: Diagnosis not present

## 2023-06-28 DIAGNOSIS — N952 Postmenopausal atrophic vaginitis: Secondary | ICD-10-CM

## 2023-06-28 DIAGNOSIS — N8111 Cystocele, midline: Secondary | ICD-10-CM | POA: Diagnosis not present

## 2023-06-28 NOTE — Progress Notes (Signed)
   Acute Office Visit  Subjective:    Patient ID: Diane Sawyer, female    DOB: September 07, 1946, 77 y.o.   MRN: [id number]   HPI 77 y.o. presents today for pessary maintenance. Using #4 ring pessary with support. 05/31/23 saw Dr. Oscar La who noticed some vaginal irritation, slightly friable. Recommended leaving pessary out, using vaginal estrogen twice weekly and follow up in 2-4 weeks.   No LMP recorded. Patient is postmenopausal.    Review of Systems  Constitutional: Negative.   Genitourinary:  Negative for vaginal bleeding, vaginal discharge and vaginal pain.       Objective:    Physical Exam Constitutional:      Appearance: Normal appearance.  Genitourinary:    General: Normal vulva.     Vagina: Normal.     Cervix: Normal.     Comments: Cystocele    BP 116/64   Pulse 84   Resp 16  Wt Readings from Last 3 Encounters:  05/31/23 197 lb (89.4 kg)  03/13/23 195 lb (88.5 kg)  03/02/23 198 lb (89.8 kg)        Patient informed chaperone available to be present for breast and/or pelvic exam. Patient has requested no chaperone to be present. Patient has been advised what will be completed during breast and pelvic exam.   Assessment & Plan:   Problem List Items Addressed This Visit   None Visit Diagnoses     Pessary maintenance    -  Primary   Vaginal atrophy       Cystocele, midline          Plan: Normal vaginal exam, no evidence of irritation, lesions or abnormal discharge. Pessary inserted with ease. Continue vaginal estrogen twice weekly. Return in 3 months for breast and pelvic exam and pessary maintenance.      Olivia Mackie DNP, 3:56 PM 06/28/2023

## 2023-07-03 ENCOUNTER — Ambulatory Visit (INDEPENDENT_AMBULATORY_CARE_PROVIDER_SITE_OTHER): Payer: Medicare Other

## 2023-07-03 VITALS — Ht 63.5 in | Wt 195.0 lb

## 2023-07-03 DIAGNOSIS — Z0000 Encounter for general adult medical examination without abnormal findings: Secondary | ICD-10-CM

## 2023-07-03 DIAGNOSIS — Z780 Asymptomatic menopausal state: Secondary | ICD-10-CM | POA: Diagnosis not present

## 2023-07-03 DIAGNOSIS — Z1231 Encounter for screening mammogram for malignant neoplasm of breast: Secondary | ICD-10-CM | POA: Diagnosis not present

## 2023-07-03 NOTE — Patient Instructions (Signed)
[name] , Thank you for taking time to come for your Medicare Wellness Visit. I appreciate your ongoing commitment to your health goals. Please review the following plan we discussed and let me know if I can assist you in the future.   These are the goals we discussed:  Goals       DIET - EAT MORE FRUITS AND VEGETABLES      patient      Maintain my current health status, enjoy life and family.      Patient Stated (pt-stated)      I would like to lose 12 more pounds and continue to stay active.      Patient Stated (pt-stated)      Remain as active as she is currently is.         This is a list of the screening recommended for you and due dates:  Health Maintenance  Topic Date Due   Hepatitis C Screening  Never done   Zoster (Shingles) Vaccine (1 of 2) 07/05/1997   COVID-19 Vaccine (4 - 2023-24 season) 08/05/2022   Flu Shot  07/06/2023   Medicare Annual Wellness Visit  07/02/2024   Colon Cancer Screening  08/30/2025   DTaP/Tdap/Td vaccine (3 - Td or Tdap) 08/19/2030   Pneumonia Vaccine  Completed   DEXA scan (bone density measurement)  Completed   HPV Vaccine  Aged Out    Advanced directives: Advance directive discussed with you today. Even though you declined this today, please call our office should you change your mind, and we can give you the proper paperwork for you to fill out. Advance care planning is a way to make decisions about medical care that fits your values in case you are ever unable to make these decisions for yourself.  Information on Advanced Care Planning can be found at [facility] Advance Health Care Directives Advance Health Care Directives ([email or web address]    Conditions/risks identified:  You have an order for:  []   2D Mammogram  [x]   3D Mammogram  [x]   Bone Density     Please call for appointment:   [facility] [address] [place], [place] [address] [phone]  Make sure to wear two-piece  clothing.  No lotions powders or deodorants the day of the appointment Make sure to bring picture ID and insurance card.  Bring list of medications you are currently taking including any supplements.   Schedule your [place] screening mammogram through MyChart!   Log into your MyChart account.  Go to 'Visit' (or 'Appointments' if on mobile App) --> Schedule an Appointment  Under 'Select a Reason for Visit' choose the Mammogram Screening option.  Complete the pre-visit questions and select the time and place that best fits your schedule.    Next appointment: VIRTUAL/TELEPHONE APPOINTMENT Follow up in one year for your annual wellness visit  July 09, 2024 at 9:45 am telephone visit   Preventive Care 71 Years and Older, Female Preventive care refers to lifestyle choices and visits with your health care provider that can promote health and wellness. What does preventive care include? A yearly physical exam. This is also called an annual well check. Dental exams once or twice a year. Routine eye exams. Ask your health care provider how often you should have your eyes checked. Personal lifestyle choices, including: Daily care of your teeth and gums. Regular physical activity. Eating a healthy diet. Avoiding tobacco and drug use. Limiting alcohol use. Practicing  safe sex. Taking low-dose aspirin every day. Taking vitamin and mineral supplements as recommended by your health care provider. What happens during an annual well check? The services and screenings done by your health care provider during your annual well check will depend on your age, overall health, lifestyle risk factors, and family history of disease. Counseling  Your health care provider may ask you questions about your: Alcohol use. Tobacco use. Drug use. Emotional well-being. Home and relationship well-being. Sexual activity. Eating habits. History of falls. Memory and ability to understand  (cognition). Work and work Astronomer. Reproductive health. Screening  You may have the following tests or measurements: Height, weight, and BMI. Blood pressure. Lipid and cholesterol levels. These may be checked every 5 years, or more frequently if you are over 34 years old. Skin check. Lung cancer screening. You may have this screening every year starting at age 13 if you have a 30-pack-year history of smoking and currently smoke or have quit within the past 15 years. Fecal occult blood test (FOBT) of the stool. You may have this test every year starting at age 13. Flexible sigmoidoscopy or colonoscopy. You may have a sigmoidoscopy every 5 years or a colonoscopy every 10 years starting at age 17. Hepatitis C blood test. Hepatitis B blood test. Sexually transmitted disease (STD) testing. Diabetes screening. This is done by checking your blood sugar (glucose) after you have not eaten for a while (fasting). You may have this done every 1-3 years. Bone density scan. This is done to screen for osteoporosis. You may have this done starting at age 62. Mammogram. This may be done every 1-2 years. Talk to your health care provider about how often you should have regular mammograms. Talk with your health care provider about your test results, treatment options, and if necessary, the need for more tests. Vaccines  Your health care provider may recommend certain vaccines, such as: Influenza vaccine. This is recommended every year. Tetanus, diphtheria, and acellular pertussis (Tdap, Td) vaccine. You may need a Td booster every 10 years. Zoster vaccine. You may need this after age 31. Pneumococcal 13-valent conjugate (PCV13) vaccine. One dose is recommended after age 77. Pneumococcal polysaccharide (PPSV23) vaccine. One dose is recommended after age 65. Talk to your health care provider about which screenings and vaccines you need and how often you need them. This information is not intended to  replace advice given to you by your health care provider. Make sure you discuss any questions you have with your health care provider. Document Released: 12/18/2015 Document Revised: 08/10/2016 Document Reviewed: 09/22/2015 Elsevier Interactive Patient Education  2017 [employer].  Fall Prevention in the Home Falls can cause injuries. They can happen to people of all ages. There are many things you can do to make your home safe and to help prevent falls. What can I do on the outside of my home? Regularly fix the edges of walkways and driveways and fix any cracks. Remove anything that might make you trip as you walk through a door, such as a raised step or threshold. Trim any bushes or trees on the path to your home. Use bright outdoor lighting. Clear any walking paths of anything that might make someone trip, such as rocks or tools. Regularly check to see if handrails are loose or broken. Make sure that both sides of any steps have handrails. Any raised decks and porches should have guardrails on the edges. Have any leaves, snow, or ice cleared regularly. Use sand or salt  on walking paths during winter. Clean up any spills in your garage right away. This includes oil or grease spills. What can I do in the bathroom? Use night lights. Install grab bars by the toilet and in the tub and shower. Do not use towel bars as grab bars. Use non-skid mats or decals in the tub or shower. If you need to sit down in the shower, use a plastic, non-slip stool. Keep the floor dry. Clean up any water that spills on the floor as soon as it happens. Remove soap buildup in the tub or shower regularly. Attach bath mats securely with double-sided non-slip rug tape. Do not have throw rugs and other things on the floor that can make you trip. What can I do in the bedroom? Use night lights. Make sure that you have a light by your bed that is easy to reach. Do not use any sheets or blankets that are too big for  your bed. They should not hang down onto the floor. Have a firm chair that has side arms. You can use this for support while you get dressed. Do not have throw rugs and other things on the floor that can make you trip. What can I do in the kitchen? Clean up any spills right away. Avoid walking on wet floors. Keep items that you use a lot in easy-to-reach places. If you need to reach something above you, use a strong step stool that has a grab bar. Keep electrical cords out of the way. Do not use floor polish or wax that makes floors slippery. If you must use wax, use non-skid floor wax. Do not have throw rugs and other things on the floor that can make you trip. What can I do with my stairs? Do not leave any items on the stairs. Make sure that there are handrails on both sides of the stairs and use them. Fix handrails that are broken or loose. Make sure that handrails are as long as the stairways. Check any carpeting to make sure that it is firmly attached to the stairs. Fix any carpet that is loose or worn. Avoid having throw rugs at the top or bottom of the stairs. If you do have throw rugs, attach them to the floor with carpet tape. Make sure that you have a light switch at the top of the stairs and the bottom of the stairs. If you do not have them, ask someone to add them for you. What else can I do to help prevent falls? Wear shoes that: Do not have high heels. Have rubber bottoms. Are comfortable and fit you well. Are closed at the toe. Do not wear sandals. If you use a stepladder: Make sure that it is fully opened. Do not climb a closed stepladder. Make sure that both sides of the stepladder are locked into place. Ask someone to hold it for you, if possible. Clearly mark and make sure that you can see: Any grab bars or handrails. First and last steps. Where the edge of each step is. Use tools that help you move around (mobility aids) if they are needed. These  include: Canes. Walkers. Scooters. Crutches. Turn on the lights when you go into a dark area. Replace any light bulbs as soon as they burn out. Set up your furniture so you have a clear path. Avoid moving your furniture around. If any of your floors are uneven, fix them. If there are any pets around you, be aware of where  they are. Review your medicines with your doctor. Some medicines can make you feel dizzy. This can increase your chance of falling. Ask your doctor what other things that you can do to help prevent falls. This information is not intended to replace advice given to you by your health care provider. Make sure you discuss any questions you have with your health care provider. Document Released: 09/17/2009 Document Revised: 04/28/2016 Document Reviewed: 12/26/2014 Elsevier Interactive Patient Education  2017 [employer].

## 2023-07-03 NOTE — Progress Notes (Cosign Needed Addendum)
Because this visit was a virtual/telehealth visit,  certain criteria was not obtained, such a blood pressure, CBG if patient is a diabetic, and timed up and go. Any medications not marked as "taking" was not mentioned during the medication reconciliation part of the visit. Any vitals not documented were not able to be obtained due to this being a telehealth visit. Vitals documented are verbally provided by the patient.  Per patient no change in vitals since last visit, unable to obtain new vitals due to telehealth visit  Subjective:   Diane Sawyer is a 77 y.o. female who presents for Medicare Annual (Subsequent) preventive examination.  Visit Complete: Virtual  I connected with  Diane Sawyer on 07/03/23 by a audio enabled telemedicine application and verified that I am speaking with the correct person using two identifiers.  Patient Location: Home  Provider Location: Home Office  I discussed the limitations of evaluation and management by telemedicine. The patient expressed understanding and agreed to proceed.  Patient Medicare AWV questionnaire was completed by the patient on n/a; I have confirmed that all information answered by patient is correct and no changes since this date.  Review of Systems     Cardiac Risk Factors include: advanced age (>14men, >50 women);dyslipidemia;hypertension;sedentary lifestyle     Objective:    Today's Vitals   07/03/23 1013  Weight: 195 lb (88.5 kg)  Height: 5' 3.5" (1.613 m)   Body mass index is 34 kg/m.     07/03/2023   10:13 AM 06/27/2022   11:05 AM 06/17/2021    4:00 PM 06/17/2020    3:23 PM 12/31/2018    9:28 AM 10/01/2016    5:23 AM  Advanced Directives  Does Patient Have a Medical Advance Directive? No No No No No No  Does patient want to make changes to medical advance directive?     Yes (ED - Information included in AVS)   Would patient like information on creating a medical advance directive? No - Patient declined No - Patient  declined No - Patient declined No - Patient declined  No - patient declined information    Current Medications (verified) Outpatient Encounter Medications as of 07/03/2023  Medication Sig   acetaminophen (TYLENOL) 500 MG tablet Take 1,000 mg by mouth every 6 (six) hours as needed for mild pain.   ALPRAZolam (XANAX) 0.25 MG tablet TAKE 1 TABLET(0.25 MG) BY MOUTH TWICE DAILY AS NEEDED FOR ANXIETY   amLODipine (NORVASC) 2.5 MG tablet Take 1 tablet (2.5 mg total) by mouth daily.   dorzolamide-timolol (COSOPT) 22.3-6.8 MG/ML ophthalmic solution 1 drop 2 (two) times daily.   estradiol (ESTRACE) 0.1 MG/GM vaginal cream 1 gram vaginally every night for one week, then change to twice weekly at hs   fexofenadine (ALLEGRA) 180 MG tablet Take 180 mg by mouth daily.   ipratropium (ATROVENT) 0.06 % nasal spray Place 2 sprays into the nose 3 (three) times daily. Annual appt due must see provider for future refills   irbesartan (AVAPRO) 150 MG tablet TAKE 1 TABLET(150 MG) BY MOUTH DAILY   IRON PO Take 65 mg by mouth daily.   latanoprost (XALATAN) 0.005 % ophthalmic solution INT 1 GTT IN OU HS   pantoprazole (PROTONIX) 40 MG tablet TAKE 1 TABLET(40 MG) BY MOUTH TWICE DAILY   VITAMIN D PO Take 1,000 Units by mouth daily.   No facility-administered encounter medications on file as of 07/03/2023.    Allergies (verified) Benazepril hcl, Levofloxacin, and Mobic [meloxicam]   History:  Past Medical History:  Diagnosis Date   Adenomatous polyp of colon 2004   Allergic rhinitis    Anemia    Anxiety    Back pain    Bladder prolapse, female, acquired    Cataract 2018   Diverticulosis    GERD (gastroesophageal reflux disease)    Glaucoma    Hypertension    Lumbar compression fracture (HCC) 09/07/2016   x 2    Menopause    Urinary incontinence    Vitamin D deficiency    Past Surgical History:  Procedure Laterality Date   CATARACT EXTRACTION, BILATERAL  04/06/2017   with cypass stent implanted    COLONOSCOPY     cypess stent left eye Left 2018   POLYPECTOMY     Family History  Problem Relation Age of Onset   Stroke Mother 26   Hypertension Mother    Sudden death Mother    Anxiety disorder Mother    Lung cancer Father 37   Colon cancer Neg Hx    Esophageal cancer Neg Hx    Stomach cancer Neg Hx    Rectal cancer Neg Hx    Social History   Socioeconomic History   Marital status: Married    Spouse name: Diane Cove" Sawyer   Number of children: 2   Years of education: Not on file   Highest education level: Not on file  Occupational History   Occupation: Hair dresser  Tobacco Use   Smoking status: Never   Smokeless tobacco: Never  Vaping Use   Vaping status: Never Used  Substance and Sexual Activity   Alcohol use: No   Drug use: No   Sexual activity: Yes    Partners: Male    Birth control/protection: Post-menopausal  Other Topics Concern   Not on file  Social History Narrative   Patient is very active within her community. She still works as a Producer, television/film/video full time. She does gardening and yard work. She cooks big meals for her sister, who is caring for her husband who has dementia. She also volunteers.    Social Determinants of Health   Financial Resource Strain: Low Risk  (07/03/2023)   Overall Financial Resource Strain (CARDIA)    Difficulty of Paying Living Expenses: Not hard at all  Food Insecurity: No Food Insecurity (07/03/2023)   Hunger Vital Sign    Worried About Running Out of Food in the Last Year: Never true    Ran Out of Food in the Last Year: Never true  Transportation Needs: No Transportation Needs (07/03/2023)   PRAPARE - Administrator, Civil Service (Medical): No    Lack of Transportation (Non-Medical): No  Physical Activity: Sufficiently Active (07/03/2023)   Exercise Vital Sign    Days of Exercise per Week: 7 days    Minutes of Exercise per Session: 60 min  Stress: No Stress Concern Present (07/03/2023)   [employer] of  Occupational Health - Occupational Stress Questionnaire    Feeling of Stress : Not at all  Social Connections: Socially Integrated (07/03/2023)   Social Connection and Isolation Panel [NHANES]    Frequency of Communication with Friends and Family: More than three times a week    Frequency of Social Gatherings with Friends and Family: More than three times a week    Attends Religious Services: More than 4 times per year    Active Member of [employer] or Organizations: Yes    Attends Banker Meetings: More than 4 times per  year    Marital Status: Married    Tobacco Counseling Counseling given: Yes   Clinical Intake:  Pre-visit preparation completed: Yes  Pain : No/denies pain     BMI - recorded: 34 Nutritional Risks: None Diabetes: No  How often do you need to have someone help you when you read instructions, pamphlets, or other written materials from your doctor or pharmacy?: 1 - Never  Interpreter Needed?: No  Information entered by :: Abby Aladdin Kollmann, CMA   Activities of Daily Living    07/03/2023   10:20 AM  In your present state of health, do you have any difficulty performing the following activities:  Hearing? 0  Vision? 0  Difficulty concentrating or making decisions? 0  Walking or climbing stairs? 0  Dressing or bathing? 0  Doing errands, shopping? 0  Preparing Food and eating ? N  Using the Toilet? N  In the past six months, have you accidently leaked urine? N  Do you have problems with loss of bowel control? N  Managing your Medications? N  Managing your Finances? N  Housekeeping or managing your Housekeeping? N    Patient Care Team: Plotnikov, Georgina Quint, MD as PCP - General Meryl Dare, MD as Attending Physician (Gastroenterology) Sallye Lat, MD as Consulting Physician (Ophthalmology) Romualdo Bolk, MD (Inactive) as Consulting Physician (Obstetrics and Gynecology)  Indicate any recent Medical Services you may have received  from other than [facility] providers in the past year (date may be approximate).     Assessment:   This is a routine wellness examination for [place].  Hearing/Vision screen Hearing Screening - Comments:: Patient denies any hearing difficulties.    Dietary issues and exercise activities discussed:     Goals Addressed               This Visit's Progress     Patient Stated (pt-stated)        Remain as active as she is currently is.        Depression Screen    07/03/2023   10:16 AM 03/13/2023    9:41 AM 06/27/2022   11:00 AM 06/17/2021    4:13 PM 06/17/2020    3:24 PM 04/20/2020   11:13 AM 12/31/2018    9:37 AM  PHQ 2/9 Scores  PHQ - 2 Score 0 0 0 0 0 0 0  PHQ- 9 Score   0        Fall Risk    07/03/2023   10:20 AM 06/28/2023    3:28 PM 03/13/2023    9:40 AM 06/27/2022   11:05 AM 06/17/2021    4:13 PM  Fall Risk   Falls in the past year? 0 0 0 0 0  Number falls in past yr: 0 0 0 0 0  Injury with Fall? 0 0 0 0 0  Risk for fall due to : No Fall Risks No Fall Risks No Fall Risks No Fall Risks No Fall Risks  Follow up Falls prevention discussed Falls evaluation completed Falls evaluation completed Falls evaluation completed Falls evaluation completed    MEDICARE RISK AT HOME:  Medicare Risk at Home - 07/03/23 1019     Any stairs in or around the home? No    If so, are there any without handrails? No    Home free of loose throw rugs in walkways, pet beds, electrical cords, etc? Yes    Adequate lighting in your home to reduce risk of falls? Yes    Life  alert? No    Use of a cane, walker or w/c? No    Grab bars in the bathroom? No    Shower chair or bench in shower? No    Elevated toilet seat or a handicapped toilet? No             TIMED UP AND GO:  Was the test performed?  No    Cognitive Function:        07/03/2023   10:19 AM 06/27/2022   11:06 AM  6CIT Screen  What Year? 0 points 0 points  What month? 0 points 0 points  What time? 0 points 0 points  Count  back from 20 0 points 0 points  Months in reverse 0 points 0 points  Repeat phrase 0 points 0 points  Total Score 0 points 0 points    Immunizations Immunization History  Administered Date(s) Administered   Fluad Quad(high Dose 65+) 10/21/2019, 08/19/2020, 09/01/2021, 09/05/2022   Influenza Whole 09/01/2008, 08/31/2009, 09/27/2010   Influenza, High Dose Seasonal PF 09/18/2013, 11/21/2016, 10/17/2017, 12/31/2018   Influenza, Seasonal, Injecte, Preservative Fre 11/12/2012   Influenza,inj,Quad PF,6+ Mos 11/10/2014, 11/16/2015   PFIZER(Purple Top)SARS-COV-2 Vaccination 03/16/2020, 04/08/2020, 10/17/2020   Pneumococcal Conjugate-13 11/10/2014   Pneumococcal Polysaccharide-23 11/12/2012   Td 05/05/2010   Tdap 08/19/2020   Zoster, Live 12/21/2009    TDAP status: Up to date  Flu Vaccine status: Up to date  Pneumococcal vaccine status: Due, Education has been provided regarding the importance of this vaccine. Advised may receive this vaccine at local pharmacy or Health Dept. Aware to provide a copy of the vaccination record if obtained from local pharmacy or Health Dept. Verbalized acceptance and understanding.  Covid-19 vaccine status: Information provided on how to obtain vaccines.   Qualifies for Shingles Vaccine? Yes   Zostavax completed Yes   Shingrix Completed?: No.    Education has been provided regarding the importance of this vaccine. Patient has been advised to call insurance company to determine out of pocket expense if they have not yet received this vaccine. Advised may also receive vaccine at local pharmacy or Health Dept. Verbalized acceptance and understanding.  Screening Tests Health Maintenance  Topic Date Due   Hepatitis C Screening  Never done   Zoster Vaccines- Shingrix (1 of 2) 07/05/1997   COVID-19 Vaccine (4 - 2023-24 season) 08/05/2022   Medicare Annual Wellness (AWV)  06/28/2023   INFLUENZA VACCINE  07/06/2023   Colonoscopy  08/30/2025   DTaP/Tdap/Td (3 -  Td or Tdap) 08/19/2030   Pneumonia Vaccine 49+ Years old  Completed   DEXA SCAN  Completed   HPV VACCINES  Aged Out    Health Maintenance  Health Maintenance Due  Topic Date Due   Hepatitis C Screening  Never done   Zoster Vaccines- Shingrix (1 of 2) 07/05/1997   COVID-19 Vaccine (4 - 2023-24 season) 08/05/2022   Medicare Annual Wellness (AWV)  06/28/2023    Colorectal cancer screening: Type of screening: Colonoscopy. Completed 08/30/2022. Repeat every 3 years  Mammogram status: Ordered 07/03/2023. Pt provided with contact info and advised to call to schedule appt.   Bone Density status: Ordered 07/03/2023. Pt provided with contact info and advised to call to schedule appt.  Lung Cancer Screening: (Low Dose CT Chest recommended if Age 13-80 years, 20 pack-year currently smoking OR have quit w/in 15years.) does not qualify.   Additional Screening:  Hepatitis C Screening: does not qualify  Vision Screening: Recommended annual ophthalmology exams for early detection of  glaucoma and other disorders of the eye. Is the patient up to date with their annual eye exam?  Yes  Who is the provider or what is the name of the office in which the patient attends annual eye exams? Dr. Dione Booze If pt is not established with a provider, would they like to be referred to a provider to establish care? No .   Dental Screening: Recommended annual dental exams for proper oral hygiene  Diabetic Foot Exam: n/a  Community Resource Referral / Chronic Care Management: CRR required this visit?  No   CCM required this visit?  No     Plan:     I have personally reviewed and noted the following in the patient's chart:   Medical and social history Use of alcohol, tobacco or illicit drugs  Current medications and supplements including opioid prescriptions. Patient is not currently taking opioid prescriptions. Functional ability and status Nutritional status Physical activity Advanced directives List  of other physicians Hospitalizations, surgeries, and ER visits in previous 12 months Vitals Screenings to include cognitive, depression, and falls Referrals and appointments  In addition, I have reviewed and discussed with patient certain preventive protocols, quality metrics, and best practice recommendations. A written personalized care plan for preventive services as well as general preventive health recommendations were provided to patient.     Jordan Hawks [employer], CMA   07/03/2023   After Visit Summary: (MyChart) Due to this being a telephonic visit, the after visit summary with patients personalized plan was offered to patient via MyChart   Nurse Notes: Mammogram and Dexa ordered today   Medical screening examination/treatment/procedure(s) were performed by non-physician practitioner and as supervising physician I was immediately available for consultation/collaboration.  I agree with above. Jacinta Shoe, MD

## 2023-09-25 ENCOUNTER — Ambulatory Visit: Payer: BLUE CROSS/BLUE SHIELD | Admitting: Internal Medicine

## 2023-09-27 DIAGNOSIS — Z961 Presence of intraocular lens: Secondary | ICD-10-CM | POA: Diagnosis not present

## 2023-09-27 DIAGNOSIS — H401111 Primary open-angle glaucoma, right eye, mild stage: Secondary | ICD-10-CM | POA: Diagnosis not present

## 2023-09-27 DIAGNOSIS — H35373 Puckering of macula, bilateral: Secondary | ICD-10-CM | POA: Diagnosis not present

## 2023-09-27 DIAGNOSIS — H26492 Other secondary cataract, left eye: Secondary | ICD-10-CM | POA: Diagnosis not present

## 2023-09-27 DIAGNOSIS — H401122 Primary open-angle glaucoma, left eye, moderate stage: Secondary | ICD-10-CM | POA: Diagnosis not present

## 2023-10-02 ENCOUNTER — Ambulatory Visit: Payer: BLUE CROSS/BLUE SHIELD | Admitting: Internal Medicine

## 2023-10-02 ENCOUNTER — Encounter: Payer: Self-pay | Admitting: Nurse Practitioner

## 2023-10-02 ENCOUNTER — Ambulatory Visit (INDEPENDENT_AMBULATORY_CARE_PROVIDER_SITE_OTHER): Payer: Medicare Other | Admitting: Nurse Practitioner

## 2023-10-02 VITALS — BP 122/80 | HR 80 | Ht 62.0 in | Wt 197.0 lb

## 2023-10-02 DIAGNOSIS — N8111 Cystocele, midline: Secondary | ICD-10-CM

## 2023-10-02 DIAGNOSIS — Z4689 Encounter for fitting and adjustment of other specified devices: Secondary | ICD-10-CM | POA: Diagnosis not present

## 2023-10-02 DIAGNOSIS — Z01419 Encounter for gynecological examination (general) (routine) without abnormal findings: Secondary | ICD-10-CM | POA: Diagnosis not present

## 2023-10-02 DIAGNOSIS — N952 Postmenopausal atrophic vaginitis: Secondary | ICD-10-CM | POA: Diagnosis not present

## 2023-10-02 DIAGNOSIS — M8580 Other specified disorders of bone density and structure, unspecified site: Secondary | ICD-10-CM | POA: Diagnosis not present

## 2023-10-02 DIAGNOSIS — Z780 Asymptomatic menopausal state: Secondary | ICD-10-CM

## 2023-10-02 MED ORDER — ESTRADIOL 0.1 MG/GM VA CREA: 1.0000 g | TOPICAL_CREAM | VAGINAL | 2 refills | Status: DC

## 2023-10-02 NOTE — Progress Notes (Signed)
   Diane Sawyer 1946-02-09 [id number]   History:  77 y.o. G2P2002 presents for breast and pelvic exam and pessary maintenance.  Postmenopausal - no HRT, no bleeding. Using #4 ring pessary with support. Good management. Using vaginal estrogen twice weekly.   Gynecologic History No LMP recorded. Patient is postmenopausal.   Contraception: post menopausal status Sexually active: No  Health Maintenance Last Pap: 2011 Last mammogram: 03/12/2020. Results were: Normal Last colonoscopy: 08/30/2022. Results were: SSP, 3-year recall Last Dexa: 07/31/2017. Results were: T-score -1.7 FRAX 18% / 3.5%  Past medical history, past surgical history, family history and social history were all reviewed and documented in the EPIC chart. Married. 2 children.   ROS:  A ROS was performed and pertinent positives and negatives are included.  Exam:  Vitals:   10/02/23 1414  BP: 122/80  Pulse: 80  SpO2: 94%  Weight: 197 lb (89.4 kg)  Height: 5\' 2"  (1.575 m)   Body mass index is 36.03 kg/m.  Physical Exam Constitutional:      Appearance: Normal appearance.  Chest:  Breasts:    Right: Normal.     Left: Normal.  Genitourinary:    General: Normal vulva.     Vagina: No signs of injury. Vaginal discharge and prolapsed vaginal walls present. No erythema, tenderness, bleeding or lesions.     Cervix: Normal.     Uterus: Normal.      Adnexa: Right adnexa normal and left adnexa normal.     Comments: Bladder prolapse, atrophic changes present     Patient informed chaperone available to be present for breast and pelvic exam. Patient has requested no chaperone to be present. Patient has been advised what will be completed during breast and pelvic exam.   Assessment/Plan:  77 y.o. G2P2002 for breast and pelvic exam.   Encounter for breast and pelvic examination - Education provided on SBEs, importance of preventative screenings, current guidelines, high calcium diet, regular exercise, and multivitamin daily.   Labs with PCP.   Postmenopausal - no bleeding, no systemic HRT  Pessary maintenance - Pessary removed, cleaned and reinserted. Patient tolerated well.   Cystocele, midline - good management with pessary. Denies urinary symptoms.   Osteopenia, unspecified location - T-score -1.7 without elevated FRAX. Managed by PCP. DXA scheduled in January 2025.  Vaginal atrophy - Plan: estradiol (ESTRACE) 0.1 MG/GM vaginal cream twice weekly. Good management.   Screening for cervical cancer - Normal Pap history.  No longer screening per guidelines.   Screening for breast cancer - Normal mammogram history. Overdue. Scheduled 01/01/2024. Normal breast exam today.  Screening for colon cancer - 08/2022 colonoscopy. Will repeat at 3-year interval per GI's recommendation.   Return in about 3 months (around 01/02/2024) for pessary maintenance, 2 years B&P.   Olivia Mackie DNP, 3:17 PM 10/02/2023

## 2023-10-09 ENCOUNTER — Ambulatory Visit (INDEPENDENT_AMBULATORY_CARE_PROVIDER_SITE_OTHER): Payer: Medicare Other | Admitting: Internal Medicine

## 2023-10-09 ENCOUNTER — Encounter: Payer: Self-pay | Admitting: Internal Medicine

## 2023-10-09 VITALS — BP 122/70 | HR 81 | Temp 98.6°F | Ht 62.0 in | Wt 194.0 lb

## 2023-10-09 DIAGNOSIS — R739 Hyperglycemia, unspecified: Secondary | ICD-10-CM

## 2023-10-09 DIAGNOSIS — E785 Hyperlipidemia, unspecified: Secondary | ICD-10-CM

## 2023-10-09 DIAGNOSIS — J309 Allergic rhinitis, unspecified: Secondary | ICD-10-CM | POA: Diagnosis not present

## 2023-10-09 DIAGNOSIS — D509 Iron deficiency anemia, unspecified: Secondary | ICD-10-CM

## 2023-10-09 DIAGNOSIS — M8580 Other specified disorders of bone density and structure, unspecified site: Secondary | ICD-10-CM

## 2023-10-09 DIAGNOSIS — I10 Essential (primary) hypertension: Secondary | ICD-10-CM | POA: Diagnosis not present

## 2023-10-09 DIAGNOSIS — Z23 Encounter for immunization: Secondary | ICD-10-CM | POA: Diagnosis not present

## 2023-10-09 MED ORDER — ALPRAZOLAM 0.25 MG PO TABS: ORAL_TABLET | ORAL | 1 refills | Status: DC

## 2023-10-09 MED ORDER — IPRATROPIUM BROMIDE 0.06 % NA SOLN: 2.0000 | Freq: Three times a day (TID) | NASAL | 3 refills | Status: DC

## 2023-10-09 NOTE — Assessment & Plan Note (Signed)
Loose wt Check A1c

## 2023-10-09 NOTE — Assessment & Plan Note (Signed)
We cont on Amlodipine 2.5 mg/d and Irbesartan 150 mg/d

## 2023-10-09 NOTE — Progress Notes (Signed)
Subjective:  Patient ID: Diane Sawyer, female    DOB: 11-17-1946  Age: 77 y.o. MRN: [id number]  CC: Medical Management of Chronic Issues (6 mnth f/u)   HPI Diane Sawyer presents for HTN, anxiety, rhinitis  Outpatient Medications Prior to Visit  Medication Sig Dispense Refill   acetaminophen (TYLENOL) 500 MG tablet Take 1,000 mg by mouth every 6 (six) hours as needed for mild pain.     amLODipine (NORVASC) 2.5 MG tablet Take 1 tablet (2.5 mg total) by mouth daily. 90 tablet 3   dorzolamide-timolol (COSOPT) 22.3-6.8 MG/ML ophthalmic solution 1 drop 2 (two) times daily.     estradiol (ESTRACE) 0.1 MG/GM vaginal cream Place 1 g vaginally 2 (two) times a week. 42.5 g 2   fexofenadine (ALLEGRA) 180 MG tablet Take 180 mg by mouth daily.     irbesartan (AVAPRO) 150 MG tablet TAKE 1 TABLET(150 MG) BY MOUTH DAILY 90 tablet 3   IRON PO Take 65 mg by mouth daily.     latanoprost (XALATAN) 0.005 % ophthalmic solution INT 1 GTT IN OU HS     pantoprazole (PROTONIX) 40 MG tablet TAKE 1 TABLET(40 MG) BY MOUTH TWICE DAILY 180 tablet 3   VITAMIN D PO Take 1,000 Units by mouth daily.     ALPRAZolam (XANAX) 0.25 MG tablet TAKE 1 TABLET(0.25 MG) BY MOUTH TWICE DAILY AS NEEDED FOR ANXIETY 180 tablet 1   ipratropium (ATROVENT) 0.06 % nasal spray Place 2 sprays into the nose 3 (three) times daily. Annual appt due must see provider for future refills 45 mL 3   No facility-administered medications prior to visit.    ROS: Review of Systems  Constitutional:  Negative for activity change, appetite change, chills, fatigue and unexpected weight change.  HENT:  Negative for congestion, mouth sores and sinus pressure.   Eyes:  Negative for visual disturbance.  Respiratory:  Negative for cough and chest tightness.   Gastrointestinal:  Negative for abdominal pain and nausea.  Genitourinary:  Negative for difficulty urinating, frequency and vaginal pain.  Musculoskeletal:  Negative for back pain and gait problem.   Skin:  Negative for pallor and rash.  Neurological:  Negative for dizziness, tremors, weakness, numbness and headaches.  Psychiatric/Behavioral:  Negative for confusion and sleep disturbance.     Objective:  BP 122/70 (BP Location: Left Arm, Patient Position: Sitting, Cuff Size: Normal)   Pulse 81   Temp 98.6 F (37 C) (Oral)   Ht 5\' 2"  (1.575 m)   Wt 194 lb (88 kg)   SpO2 95%   BMI 35.48 kg/m   BP Readings from Last 3 Encounters:  10/09/23 122/70  10/02/23 122/80  06/28/23 116/64    Wt Readings from Last 3 Encounters:  10/09/23 194 lb (88 kg)  10/02/23 197 lb (89.4 kg)  07/03/23 195 lb (88.5 kg)    Physical Exam Constitutional:      General: She is not in acute distress.    Appearance: She is well-developed. She is obese.  HENT:     Head: Normocephalic.     Right Ear: External ear normal.     Left Ear: External ear normal.     Nose: Nose normal.  Eyes:     General:        Right eye: No discharge.        Left eye: No discharge.     Conjunctiva/sclera: Conjunctivae normal.     Pupils: Pupils are equal, round, and reactive to light.  Neck:  Thyroid: No thyromegaly.     Vascular: No JVD.     Trachea: No tracheal deviation.  Cardiovascular:     Rate and Rhythm: Normal rate and regular rhythm.     Heart sounds: Normal heart sounds.  Pulmonary:     Effort: No respiratory distress.     Breath sounds: No stridor. No wheezing.  Abdominal:     General: Bowel sounds are normal. There is no distension.     Palpations: Abdomen is soft. There is no mass.     Tenderness: There is no abdominal tenderness. There is no guarding or rebound.  Musculoskeletal:        General: No tenderness.     Cervical back: Normal range of motion and neck supple. No rigidity.  Lymphadenopathy:     Cervical: No cervical adenopathy.  Skin:    Findings: No erythema or rash.  Neurological:     Cranial Nerves: No cranial nerve deficit.     Motor: No abnormal muscle tone.      Coordination: Coordination normal.     Deep Tendon Reflexes: Reflexes normal.  Psychiatric:        Behavior: Behavior normal.        Thought Content: Thought content normal.        Judgment: Judgment normal.     Lab Results  Component Value Date   WBC 6.5 03/13/2023   HGB 14.1 03/13/2023   HCT 42.5 03/13/2023   PLT 223.0 03/13/2023   GLUCOSE 88 03/13/2023   CHOL 192 03/13/2023   TRIG 90.0 03/13/2023   HDL 58.80 03/13/2023   LDLCALC 115 (H) 03/13/2023   ALT 10 03/13/2023   AST 16 03/13/2023   NA 141 03/13/2023   K 4.1 03/13/2023   CL 106 03/13/2023   CREATININE 0.84 03/13/2023   BUN 14 03/13/2023   CO2 27 03/13/2023   TSH 0.93 03/13/2023   HGBA1C 5.7 10/21/2019    No results found.  Assessment & Plan:   Problem List Items Addressed This Visit     ANEMIA-IRON DEFICIENCY   Relevant Orders   CBC with Differential/Platelet   Iron, TIBC and Ferritin Panel   Allergic rhinitis    on Atrovent nasal      Osteopenia    On Vit D      Hypertension - Primary    We cont on Amlodipine 2.5 mg/d and Irbesartan 150 mg/d      Relevant Orders   TSH   Urinalysis   CBC with Differential/Platelet   Lipid panel   Comprehensive metabolic panel   Hemoglobin A1c   Hyperglycemia    Loose wt Check A1c      Relevant Orders   Hemoglobin A1c   Other Visit Diagnoses     Need for influenza vaccination       Relevant Orders   Flu Vaccine Trivalent High Dose (Fluad) (Completed)   Dyslipidemia       Relevant Orders   TSH   Lipid panel         Meds ordered this encounter  Medications   ALPRAZolam (XANAX) 0.25 MG tablet    Sig: TAKE 1 TABLET(0.25 MG) BY MOUTH TWICE DAILY AS NEEDED FOR ANXIETY    Dispense:  180 tablet    Refill:  1   ipratropium (ATROVENT) 0.06 % nasal spray    Sig: Place 2 sprays into the nose 3 (three) times daily. Annual appt due must see provider for future refills    Dispense:  45 mL  Refill:  3      Follow-up: Return in about 6 months  (around 04/07/2024) for a follow-up visit.  Sonda Primes, MD

## 2023-10-09 NOTE — Assessment & Plan Note (Signed)
On Vit D 

## 2023-10-09 NOTE — Assessment & Plan Note (Signed)
on Atrovent nasal

## 2024-01-01 ENCOUNTER — Ambulatory Visit
Admission: RE | Admit: 2024-01-01 | Discharge: 2024-01-01 | Disposition: A | Discharge status: Home / Self Care | Payer: Medicare Other | Source: Ambulatory Visit | Attending: Internal Medicine | Admitting: Internal Medicine

## 2024-01-01 DIAGNOSIS — N958 Other specified menopausal and perimenopausal disorders: Secondary | ICD-10-CM | POA: Diagnosis not present

## 2024-01-01 DIAGNOSIS — Z780 Asymptomatic menopausal state: Secondary | ICD-10-CM

## 2024-01-01 DIAGNOSIS — M8588 Other specified disorders of bone density and structure, other site: Secondary | ICD-10-CM | POA: Diagnosis not present

## 2024-01-01 DIAGNOSIS — Z1231 Encounter for screening mammogram for malignant neoplasm of breast: Secondary | ICD-10-CM | POA: Diagnosis not present

## 2024-01-01 DIAGNOSIS — E2839 Other primary ovarian failure: Secondary | ICD-10-CM | POA: Diagnosis not present

## 2024-01-03 ENCOUNTER — Ambulatory Visit (INDEPENDENT_AMBULATORY_CARE_PROVIDER_SITE_OTHER): Payer: Medicare Other | Admitting: Nurse Practitioner

## 2024-01-03 ENCOUNTER — Telehealth: Payer: Self-pay | Admitting: Internal Medicine

## 2024-01-03 ENCOUNTER — Other Ambulatory Visit: Payer: Self-pay | Admitting: Internal Medicine

## 2024-01-03 ENCOUNTER — Encounter: Payer: Self-pay | Admitting: Nurse Practitioner

## 2024-01-03 VITALS — BP 118/76 | HR 79 | Wt 196.0 lb

## 2024-01-03 DIAGNOSIS — R928 Other abnormal and inconclusive findings on diagnostic imaging of breast: Secondary | ICD-10-CM

## 2024-01-03 DIAGNOSIS — N8111 Cystocele, midline: Secondary | ICD-10-CM | POA: Diagnosis not present

## 2024-01-03 DIAGNOSIS — Z4689 Encounter for fitting and adjustment of other specified devices: Secondary | ICD-10-CM | POA: Diagnosis not present

## 2024-01-03 DIAGNOSIS — N952 Postmenopausal atrophic vaginitis: Secondary | ICD-10-CM | POA: Diagnosis not present

## 2024-01-03 NOTE — Telephone Encounter (Signed)
Pt had bone density scan recently with 'low results' and would like to know if she can have another round of prolia shots this year.   Please review and advise.

## 2024-01-03 NOTE — Progress Notes (Signed)
   Acute Office Visit  Subjective:    Patient ID: Diane Sawyer, female    DOB: 1946-10-05, 78 y.o.   MRN: [id number]   HPI 78 y.o. presents today for pessary maintenance. Using #4 ring pessary with support. Good management. Using vaginal estrogen twice weekly.   No LMP recorded. Patient is postmenopausal.    Review of Systems  Constitutional: Negative.   Genitourinary: Negative.        Objective:    Physical Exam Constitutional:      Appearance: Normal appearance.  Genitourinary:    General: Normal vulva.     Vagina: Normal.     Cervix: Normal.     Comments: Cystocele present    BP 118/76 (BP Location: Left Arm, Patient Position: Sitting, Cuff Size: Normal)   Pulse 79   Wt 196 lb (88.9 kg)   SpO2 99%   BMI 35.85 kg/m  Wt Readings from Last 3 Encounters:  01/03/24 196 lb (88.9 kg)  10/09/23 194 lb (88 kg)  10/02/23 197 lb (89.4 kg)        Patient informed chaperone available to be present for breast and/or pelvic exam. Patient has requested no chaperone to be present. Patient has been advised what will be completed during breast and pelvic exam.   Assessment & Plan:   Problem List Items Addressed This Visit   None Visit Diagnoses       Pessary maintenance    -  Primary     Cystocele, midline         Vaginal atrophy           Plan: Normal vaginal exam, no evidence of irritation, lesions or abnormal discharge. Pessary inserted with ease. Continue vaginal estrogen twice weekly.    Return in about 3 months (around 04/02/2024) for pessary maintenance.    Olivia Mackie DNP, 2:24 PM 01/03/2024

## 2024-01-04 ENCOUNTER — Telehealth: Payer: Self-pay

## 2024-01-04 NOTE — Telephone Encounter (Signed)
Copied from CRM [id number]. Topic: Clinical - Medical Advice >> Jan 04, 2024  1:44 PM Melissa C wrote: Reason for CRM: patient (very sweet) wants to know if she needed to do updated labs after she left appointment the other day or if they are needed right before she comes to next appointment, she was also inquiring about getting Prolia shot as she hasn't gotten one in quite awhile. Please advise with patient. Thank you!

## 2024-01-06 NOTE — Telephone Encounter (Signed)
Prolia injection should be once every 6 months.  See my other message please.  Thank you

## 2024-01-06 NOTE — Telephone Encounter (Signed)
Diane Sawyer's labs were ordered on 10/09/2023. If she is willing to start Prolia, we can start the process with PA Thanks

## 2024-01-09 ENCOUNTER — Other Ambulatory Visit (HOSPITAL_COMMUNITY): Payer: Self-pay

## 2024-01-09 ENCOUNTER — Telehealth: Payer: Self-pay

## 2024-01-09 NOTE — Telephone Encounter (Signed)
 [name]

## 2024-01-09 NOTE — Telephone Encounter (Signed)
 Pt ready for scheduling for Prolia  on or after : 01/09/24  Out-of-pocket cost due at time of visit: $0  Number of injection/visits approved: --  Primary: [place]  - Medicare Prolia  co-insurance: 20% Admin fee co-insurance: --  Secondary: BCBS of [place] - MEDSUP Prolia  co-insurance: 100% Admin fee co-insurance: 100%  Medical Benefit Details: Date Benefits were checked: 01/09/24 Deductible: $257(secondary to cover)/ Coinsurance: 100%/ Admin Fee: 100%  Prior Auth: not required PA# Expiration Date:   # of doses approved:  Pharmacy benefit: Copay $847.49 If patient wants fill through the pharmacy benefit please send prescription to:  [facility] , and include estimated need by date in rx notes. Pharmacy will ship medication directly to the office.  Patient not eligible for Prolia  Copay Card. Copay Card can make patient's cost as little as $25. Link to apply: [email or web address]://[email or web address].[email or web address]  ** This summary of benefits is an estimation of the patient's out-of-pocket cost. Exact cost may very based on individual plan coverage.

## 2024-01-17 ENCOUNTER — Ambulatory Visit
Admission: RE | Admit: 2024-01-17 | Discharge: 2024-01-17 | Disposition: A | Discharge status: Home / Self Care | Payer: Medicare Other | Source: Ambulatory Visit | Attending: Internal Medicine | Admitting: Internal Medicine

## 2024-01-17 ENCOUNTER — Other Ambulatory Visit: Payer: Self-pay | Admitting: Internal Medicine

## 2024-01-17 DIAGNOSIS — N6310 Unspecified lump in the right breast, unspecified quadrant: Secondary | ICD-10-CM

## 2024-01-17 DIAGNOSIS — R928 Other abnormal and inconclusive findings on diagnostic imaging of breast: Secondary | ICD-10-CM

## 2024-01-17 DIAGNOSIS — R921 Mammographic calcification found on diagnostic imaging of breast: Secondary | ICD-10-CM | POA: Diagnosis not present

## 2024-01-17 DIAGNOSIS — N6315 Unspecified lump in the right breast, overlapping quadrants: Secondary | ICD-10-CM | POA: Diagnosis not present

## 2024-01-18 ENCOUNTER — Encounter: Payer: Self-pay | Admitting: Internal Medicine

## 2024-01-22 ENCOUNTER — Ambulatory Visit
Admission: RE | Admit: 2024-01-22 | Discharge: 2024-01-22 | Disposition: A | Discharge status: Home / Self Care | Payer: Medicare Other | Source: Ambulatory Visit | Attending: Internal Medicine | Admitting: Internal Medicine

## 2024-01-22 DIAGNOSIS — N6315 Unspecified lump in the right breast, overlapping quadrants: Secondary | ICD-10-CM | POA: Diagnosis not present

## 2024-01-22 DIAGNOSIS — C50811 Malignant neoplasm of overlapping sites of right female breast: Secondary | ICD-10-CM | POA: Diagnosis not present

## 2024-01-22 DIAGNOSIS — N6310 Unspecified lump in the right breast, unspecified quadrant: Secondary | ICD-10-CM

## 2024-01-22 HISTORY — PX: BREAST BIOPSY: SHX20

## 2024-01-23 ENCOUNTER — Encounter: Payer: Self-pay | Admitting: *Deleted

## 2024-01-23 ENCOUNTER — Telehealth: Payer: Self-pay | Admitting: *Deleted

## 2024-01-23 LAB — SURGICAL PATHOLOGY

## 2024-01-23 NOTE — Telephone Encounter (Signed)
 Confirmed [facility] for 01/31/24 at 12:15pm.  Instructions and contact information given.

## 2024-01-29 ENCOUNTER — Encounter: Payer: Self-pay | Admitting: *Deleted

## 2024-01-29 DIAGNOSIS — C50411 Malignant neoplasm of upper-outer quadrant of right female breast: Secondary | ICD-10-CM | POA: Insufficient documentation

## 2024-01-29 DIAGNOSIS — Z170 Estrogen receptor positive status [ER+]: Secondary | ICD-10-CM | POA: Insufficient documentation

## 2024-01-31 ENCOUNTER — Encounter: Payer: Self-pay | Admitting: Hematology

## 2024-01-31 ENCOUNTER — Inpatient Hospital Stay: Payer: Medicare Other

## 2024-01-31 ENCOUNTER — Ambulatory Visit: Payer: Medicare Other | Admitting: Physical Therapy

## 2024-01-31 ENCOUNTER — Ambulatory Visit
Admission: RE | Admit: 2024-01-31 | Discharge: 2024-01-31 | Disposition: A | Discharge status: Home / Self Care | Payer: Medicare Other | Source: Ambulatory Visit | Attending: Radiation Oncology | Admitting: Radiation Oncology

## 2024-01-31 ENCOUNTER — Encounter: Payer: Self-pay | Admitting: General Practice

## 2024-01-31 ENCOUNTER — Encounter: Payer: Self-pay | Admitting: Genetic Counselor

## 2024-01-31 ENCOUNTER — Inpatient Hospital Stay: Payer: Medicare Other | Attending: Hematology | Admitting: Hematology

## 2024-01-31 ENCOUNTER — Inpatient Hospital Stay (HOSPITAL_BASED_OUTPATIENT_CLINIC_OR_DEPARTMENT_OTHER): Payer: Medicare Other | Admitting: Genetic Counselor

## 2024-01-31 ENCOUNTER — Encounter: Payer: Self-pay | Admitting: *Deleted

## 2024-01-31 ENCOUNTER — Other Ambulatory Visit: Payer: Self-pay | Admitting: Surgery

## 2024-01-31 VITALS — BP 143/70 | HR 96 | Temp 99.3°F | Resp 18 | Ht 62.0 in | Wt 193.4 lb

## 2024-01-31 DIAGNOSIS — Z8249 Family history of ischemic heart disease and other diseases of the circulatory system: Secondary | ICD-10-CM

## 2024-01-31 DIAGNOSIS — Z808 Family history of malignant neoplasm of other organs or systems: Secondary | ICD-10-CM | POA: Diagnosis not present

## 2024-01-31 DIAGNOSIS — M545 Low back pain, unspecified: Secondary | ICD-10-CM | POA: Diagnosis not present

## 2024-01-31 DIAGNOSIS — Z881 Allergy status to other antibiotic agents status: Secondary | ICD-10-CM | POA: Diagnosis not present

## 2024-01-31 DIAGNOSIS — Z853 Personal history of malignant neoplasm of breast: Secondary | ICD-10-CM

## 2024-01-31 DIAGNOSIS — Z1721 Progesterone receptor positive status: Secondary | ICD-10-CM

## 2024-01-31 DIAGNOSIS — M8580 Other specified disorders of bone density and structure, unspecified site: Secondary | ICD-10-CM

## 2024-01-31 DIAGNOSIS — I10 Essential (primary) hypertension: Secondary | ICD-10-CM | POA: Diagnosis not present

## 2024-01-31 DIAGNOSIS — Z1732 Human epidermal growth factor receptor 2 negative status: Secondary | ICD-10-CM

## 2024-01-31 DIAGNOSIS — Z888 Allergy status to other drugs, medicaments and biological substances status: Secondary | ICD-10-CM

## 2024-01-31 DIAGNOSIS — C50411 Malignant neoplasm of upper-outer quadrant of right female breast: Secondary | ICD-10-CM | POA: Diagnosis not present

## 2024-01-31 DIAGNOSIS — C50911 Malignant neoplasm of unspecified site of right female breast: Secondary | ICD-10-CM | POA: Diagnosis not present

## 2024-01-31 DIAGNOSIS — K219 Gastro-esophageal reflux disease without esophagitis: Secondary | ICD-10-CM | POA: Diagnosis not present

## 2024-01-31 DIAGNOSIS — Z170 Estrogen receptor positive status [ER+]: Secondary | ICD-10-CM

## 2024-01-31 DIAGNOSIS — Z823 Family history of stroke: Secondary | ICD-10-CM

## 2024-01-31 DIAGNOSIS — Z79899 Other long term (current) drug therapy: Secondary | ICD-10-CM

## 2024-01-31 DIAGNOSIS — Z801 Family history of malignant neoplasm of trachea, bronchus and lung: Secondary | ICD-10-CM | POA: Diagnosis not present

## 2024-01-31 DIAGNOSIS — Z860101 Personal history of adenomatous and serrated colon polyps: Secondary | ICD-10-CM | POA: Diagnosis not present

## 2024-01-31 DIAGNOSIS — M2550 Pain in unspecified joint: Secondary | ICD-10-CM

## 2024-01-31 DIAGNOSIS — F419 Anxiety disorder, unspecified: Secondary | ICD-10-CM | POA: Diagnosis not present

## 2024-01-31 DIAGNOSIS — C50811 Malignant neoplasm of overlapping sites of right female breast: Secondary | ICD-10-CM | POA: Insufficient documentation

## 2024-01-31 LAB — CMP (CANCER CENTER ONLY)
ALT: 7 U/L (ref 0–44)
AST: 13 U/L — ABNORMAL LOW (ref 15–41)
Albumin: 4.2 g/dL (ref 3.5–5.0)
Alkaline Phosphatase: 99 U/L (ref 38–126)
Anion gap: 7 (ref 5–15)
BUN: 18 mg/dL (ref 8–23)
CO2: 28 mmol/L (ref 22–32)
Calcium: 9 mg/dL (ref 8.9–10.3)
Chloride: 106 mmol/L (ref 98–111)
Creatinine: 0.99 mg/dL (ref 0.44–1.00)
GFR, Estimated: 59 mL/min — ABNORMAL LOW (ref >60)
Glucose, Bld: 120 mg/dL — ABNORMAL HIGH (ref 70–99)
Potassium: 4 mmol/L (ref 3.5–5.1)
Sodium: 141 mmol/L (ref 135–145)
Total Bilirubin: 0.4 mg/dL (ref 0.0–1.2)
Total Protein: 6.7 g/dL (ref 6.5–8.1)

## 2024-01-31 LAB — CBC WITH DIFFERENTIAL (CANCER CENTER ONLY)
Abs Immature Granulocytes: 0.01 10*3/uL (ref 0.00–0.07)
Basophils Absolute: 0.1 10*3/uL (ref 0.0–0.1)
Basophils Relative: 1 %
Eosinophils Absolute: 0.2 10*3/uL (ref 0.0–0.5)
Eosinophils Relative: 3 %
HCT: 43.6 % (ref 36.0–46.0)
Hemoglobin: 13.9 g/dL (ref 12.0–15.0)
Immature Granulocytes: 0 %
Lymphocytes Relative: 20 %
Lymphs Abs: 1.5 10*3/uL (ref 0.7–4.0)
MCH: 30.3 pg (ref 26.0–34.0)
MCHC: 31.9 g/dL (ref 30.0–36.0)
MCV: 95.2 fL (ref 80.0–100.0)
Monocytes Absolute: 0.4 10*3/uL (ref 0.1–1.0)
Monocytes Relative: 5 %
Neutro Abs: 5.3 10*3/uL (ref 1.7–7.7)
Neutrophils Relative %: 71 %
Platelet Count: 211 10*3/uL (ref 150–400)
RBC: 4.58 MIL/uL (ref 3.87–5.11)
RDW: 13.1 % (ref 11.5–15.5)
WBC Count: 7.4 10*3/uL (ref 4.0–10.5)
nRBC: 0 % (ref 0.0–0.2)

## 2024-01-31 LAB — GENETIC SCREENING ORDER

## 2024-01-31 NOTE — Research (Signed)
 Trial:  [employer] 2021-05 - Specimen Collection Study to Evaluate Biomarkers in Subjects with Cancer   Patient Diane Sawyer was identified by Dr. Mosetta Putt as a potential candidate for the above listed study.  This Clinical Research Nurse met with HERMINE FERIA, [id number], on 01/31/24 in a manner and location that ensures patient privacy to discuss participation in the above listed research study.  Patient is Accompanied by her daughter .  A copy of the informed consent document with embedded HIPAA language was provided to the patient.  Patient reads, speaks, and understands [place].   Patient was provided with the business card of this Nurse and encouraged to contact the research team with any questions.  Approximately 5 minutes were spent with the patient reviewing the informed consent documents.  Patient was provided the option of taking informed consent documents home to review and was encouraged to review at their convenience with their support network, including other care providers. Patient took the consent documents home to review.  Patient agreed research nurse can call her on Monday 02/05/24 to follow up.  Domenica Reamer, BSN, RN, [employer] Clinical Research Nurse II [phone] 01/31/2024 3:34 PM'

## 2024-01-31 NOTE — Progress Notes (Signed)
 Radiation Oncology         [phone] ________________________________  Multidisciplinary Breast Oncology Clinic [facility]) Initial Outpatient Consultation  Name: Diane Sawyer MRN: [id number]  Date: 01/31/2024  DOB: 14-Nov-1946  EX:[id number], Georgina Quint, MD  [name], MD   REFERRING PHYSICIAN: [name], MD  DIAGNOSIS: The encounter diagnosis was Malignant neoplasm of upper-outer quadrant of right breast in female, estrogen receptor positive (HCC).  Stage IA (cT1b, cN0, cM0) Right Breast UOQ, Invasive Ductal Carcinoma, ER+ / PR+ / Her2-, Grade 1    ICD-10-CM   1. Malignant neoplasm of upper-outer quadrant of right breast in female, estrogen receptor positive (HCC)  C50.411    Z17.0       Cancer Staging  Malignant neoplasm of upper-outer quadrant of right breast in female, estrogen receptor positive (HCC) Staging form: Breast, AJCC 8th Edition - Clinical stage from 01/22/2024: Stage IA (cT1b, cN0, cM0, G1, ER+, PR+, HER2-) - Signed by Malachy Mood, MD on 01/30/2024 Stage prefix: Initial diagnosis Histologic grading system: 3 grade system   HISTORY OF PRESENT ILLNESS::Diane Sawyer is a 78 y.o. female who is presenting to the office today for evaluation of her newly diagnosed breast cancer. She is accompanied by her daughter. She is doing well overall.   She had routine screening mammography on 01/01/24 showing a possible abnormality in the right breast. She underwent a right breast diagnostic mammography with tomography and right breast ultrasonography at [facility] on 01/17/24 showing: a highly suspicious mass in the 12 o'clock right breast measuring 8 mm, located 4 cmfn, associated with a contiguous 4 mm mass along its anterior margin. Benign calcifications were also demonstrated in the lateral right breast. No evidence of right axillary lymphadenopathy was demonstrated.   Biopsy of the 12 o'clock right breast on 01/22/24 showed: grade 1 invasive ductal carcinoma  measuring 8 mm in the greatest linear extent of the sample. Prognostic indicators significant for: estrogen receptor, 100% positive and progesterone receptor, 100% positive, both with strong staining intensity. Proliferation marker Ki67 at 5%. HER2 negative.  The patient was referred today for presentation in the multidisciplinary conference.  Radiology studies and pathology slides were presented there for review and discussion of treatment options.  A consensus was discussed regarding potential next steps.  PREVIOUS RADIATION THERAPY: No  PAST MEDICAL HISTORY:  Past Medical History:  Diagnosis Date   Adenomatous polyp of colon 2004   Allergic rhinitis    Anemia    Anxiety    Back pain    Bladder prolapse, female, acquired    Breast cancer (HCC)    Cataract 2018   Diverticulosis    GERD (gastroesophageal reflux disease)    Glaucoma    Hypertension    Lumbar compression fracture (HCC) 09/07/2016   x 2    Menopause    Urinary incontinence    Vitamin D deficiency     PAST SURGICAL HISTORY: Past Surgical History:  Procedure Laterality Date   BREAST BIOPSY Right 01/22/2024   [place] RT BREAST BX W LOC DEV 1ST LESION IMG BX SPEC US GUIDE 01/22/2024 [facility] MAMMOGRAPHY   CATARACT EXTRACTION, BILATERAL  04/06/2017   with cypass stent implanted   COLONOSCOPY     cypess stent left eye Left 2018   POLYPECTOMY      FAMILY HISTORY:  Family History  Problem Relation Age of Onset   Stroke Mother 13   Hypertension Mother    Sudden death Mother    Anxiety disorder Mother  Lung cancer Father 72       smoker   Brain cancer Paternal Uncle        benign   Colon cancer Neg Hx    Esophageal cancer Neg Hx    Stomach cancer Neg Hx    Rectal cancer Neg Hx     SOCIAL HISTORY:  Social History   Socioeconomic History   Marital status: Married    Spouse name: Reita Cliche "Meraux" [place]   Number of children: 2   Years of education: Not on file   Highest education level: Not on file  Occupational  History   Occupation: Hair dresser  Tobacco Use   Smoking status: Never   Smokeless tobacco: Never  Vaping Use   Vaping status: Never Used  Substance and Sexual Activity   Alcohol use: No   Drug use: No   Sexual activity: Not Currently    Partners: Male    Birth control/protection: Post-menopausal    Comment: First IC @ 18, Partners-1, DES-neg  Other Topics Concern   Not on file  Social History Narrative   Patient is very active within her community. She still works as a Producer, television/film/video full time. She does gardening and yard work. She cooks big meals for her sister, who is caring for her husband who has dementia. She also volunteers.    Social Drivers of Corporate investment banker Strain: Low Risk  (07/03/2023)   Overall Financial Resource Strain (CARDIA)    Difficulty of Paying Living Expenses: Not hard at all  Food Insecurity: No Food Insecurity (07/03/2023)   Hunger Vital Sign    Worried About Running Out of Food in the Last Year: Never true    Ran Out of Food in the Last Year: Never true  Transportation Needs: No Transportation Needs (07/03/2023)   PRAPARE - Administrator, Civil Service (Medical): No    Lack of Transportation (Non-Medical): No  Physical Activity: Sufficiently Active (07/03/2023)   Exercise Vital Sign    Days of Exercise per Week: 7 days    Minutes of Exercise per Session: 60 min  Stress: No Stress Concern Present (07/03/2023)   [employer] of Occupational Health - Occupational Stress Questionnaire    Feeling of Stress : Not at all  Social Connections: Socially Integrated (07/03/2023)   Social Connection and Isolation Panel [NHANES]    Frequency of Communication with Friends and Family: More than three times a week    Frequency of Social Gatherings with Friends and Family: More than three times a week    Attends Religious Services: More than 4 times per year    Active Member of [employer] or Organizations: Yes    Attends Hospital doctor: More than 4 times per year    Marital Status: Married  Patient is very active and works full time as a Producer, television/film/video.    ALLERGIES:  Allergies  Allergen Reactions   Benazepril Hcl     REACTION: achy- makes her feel strange.   Levofloxacin Nausea Only   Mobic [Meloxicam] Other (See Comments)    Felt like chest going to explode    MEDICATIONS:  Current Outpatient Medications  Medication Sig Dispense Refill   acetaminophen (TYLENOL) 500 MG tablet Take 1,000 mg by mouth every 6 (six) hours as needed for mild pain.     ALPRAZolam (XANAX) 0.25 MG tablet TAKE 1 TABLET(0.25 MG) BY MOUTH TWICE DAILY AS NEEDED FOR ANXIETY 180 tablet 1   amLODipine (NORVASC)  2.5 MG tablet Take 1 tablet (2.5 mg total) by mouth daily. 90 tablet 3   dorzolamide-timolol (COSOPT) 22.3-6.8 MG/ML ophthalmic solution 1 drop 2 (two) times daily.     estradiol (ESTRACE) 0.1 MG/GM vaginal cream Place 1 g vaginally 2 (two) times a week. 42.5 g 2   fexofenadine (ALLEGRA) 180 MG tablet Take 180 mg by mouth daily.     ipratropium (ATROVENT) 0.06 % nasal spray Place 2 sprays into the nose 3 (three) times daily. Annual appt due must see provider for future refills 45 mL 3   irbesartan (AVAPRO) 150 MG tablet TAKE 1 TABLET(150 MG) BY MOUTH DAILY 90 tablet 3   IRON PO Take 65 mg by mouth daily.     latanoprost (XALATAN) 0.005 % ophthalmic solution INT 1 GTT IN OU HS     pantoprazole (PROTONIX) 40 MG tablet TAKE 1 TABLET(40 MG) BY MOUTH TWICE DAILY 180 tablet 3   VITAMIN D PO Take 1,000 Units by mouth daily.     No current facility-administered medications for this encounter.    REVIEW OF SYSTEMS: Patient denies any breast specific complaints today.     PHYSICAL EXAM:     01/31/2024  Vitals with BMI   Height 5\' 2"    Weight 193 lbs 6 oz   BMI 35.36   Systolic 143 !   Diastolic 70 !   Pulse 96     Legend: ! Abnormal  Lungs are clear to auscultation bilaterally. Heart has regular rate and rhythm. No palpable  cervical, supraclavicular, or axillary adenopathy. Abdomen soft, non-tender, normal bowel sounds. Breast: Left breast with no palpable mass, nipple discharge, or bleeding. Right breast with biopsy lesion in the lower outer breast covered with surgical bandages, and with no palpable mass, nipple discharge, or bleeding.   KPS = 100  100 - Normal; no complaints; no evidence of disease. 90   - Able to carry on normal activity; minor signs or symptoms of disease. 80   - Normal activity with effort; some signs or symptoms of disease. 57   - Cares for self; unable to carry on normal activity or to do active work. 60   - Requires occasional assistance, but is able to care for most of his personal needs. 50   - Requires considerable assistance and frequent medical care. 40   - Disabled; requires special care and assistance. 30   - Severely disabled; hospital admission is indicated although death not imminent. 20   - Very sick; hospital admission necessary; active supportive treatment necessary. 10   - Moribund; fatal processes progressing rapidly. 0     - Dead  Karnofsky DA, Abelmann WH, Craver LS and Burchenal [facility] [phone]) The use of the nitrogen mustards in the palliative treatment of carcinoma: with particular reference to bronchogenic carcinoma Cancer 1 634-56  LABORATORY DATA:  Lab Results  Component Value Date   WBC 7.4 01/31/2024   HGB 13.9 01/31/2024   HCT 43.6 01/31/2024   MCV 95.2 01/31/2024   PLT 211 01/31/2024   Lab Results  Component Value Date   NA 141 01/31/2024   K 4.0 01/31/2024   CL 106 01/31/2024   CO2 28 01/31/2024   Lab Results  Component Value Date   ALT 7 01/31/2024   AST 13 (L) 01/31/2024   ALKPHOS 99 01/31/2024   BILITOT 0.4 01/31/2024    PULMONARY FUNCTION TEST:   Review Flowsheet        No data to display  RADIOGRAPHY: MM CLIP PLACEMENT RIGHT Addendum Date: 01/30/2024 ADDENDUM REPORT: 01/30/2024 13:34 ADDENDUM: ACR Breast Density Category  b: There are scattered areas of fibroglandular density. Electronically Signed   By: Sherron Ales M.D.   On: 01/30/2024 13:34   Result Date: 01/30/2024 CLINICAL DATA:  Postprocedure mammogram. EXAM: 3D DIAGNOSTIC RIGHT MAMMOGRAM POST ULTRASOUND BIOPSY COMPARISON:  Previous exam(s). FINDINGS: 3D Mammographic images were obtained following ultrasound guided biopsy of a 0.8 cm mass at the right breast 12 o'clock position 4 cm from the nipple. The biopsy marking clip is in expected position at the site of biopsy. IMPRESSION: Appropriate positioning of the coil shaped biopsy marking clip at the site of biopsy in the upper central right breast at middle depth. Final Assessment: Post Procedure Mammograms for Marker Placement Electronically Signed: By: Sherron Ales M.D. On: 01/22/2024 11:51   [place] RT BREAST BX W LOC DEV 1ST LESION IMG BX SPEC US GUIDE Addendum Date: 01/26/2024 ADDENDUM REPORT: 01/26/2024 08:37 ADDENDUM: PATHOLOGY revealed: 1. Breast, right, needle core biopsy, 12:00 4 cmfn : - INVASIVE DUCTAL CARCINOMA WITH EXTRACELLULAR MUCIN. Microscopic Comment: Based on the biopsy, the carcinoma appears Nottingham grade 1 of 3 and measures 0.8 cm in greatest linear extent. Pathology results are CONCORDANT with imaging findings, per Dr. Sherron Ales. Pathology results and recommendations were discussed with patient via telephone on 01/23/2024 by Randa Lynn RN. Patient reported biopsy site doing well with no adverse symptoms, and only slight tenderness at the site. Post biopsy care instructions were reviewed, questions were answered and my direct phone number was provided. Patient was instructed to call [facility] for any additional questions or concerns related to biopsy site. RECOMMENDATIONS: 1. Surgical and oncological consultation. Patient was referred to [facility] with appointment on 01/31/2024. Patient is aware of  [facility] appointment date and time. Pathology results reported by Randa Lynn RN on 01/23/2024. Electronically Signed   By: Sherron Ales M.D.   On: 01/26/2024 08:37   Result Date: 01/26/2024 CLINICAL DATA:  Highly suspicious 0.8 cm mass at the right breast 12 o'clock position 4 cm from the nipple. EXAM: ULTRASOUND GUIDED RIGHT BREAST CORE NEEDLE BIOPSY COMPARISON:  Previous exam(s). PROCEDURE: I met with the patient and we discussed the procedure of ultrasound-guided biopsy, including benefits and alternatives. We discussed the high likelihood of a successful procedure. We discussed the risks of the procedure, including infection, bleeding, tissue injury, clip migration, and inadequate sampling. Informed written consent was given. The usual time-out protocol was performed immediately prior to the procedure. Lesion quadrant: Upper central Using sterile technique and 1% Lidocaine as local anesthetic, under direct ultrasound visualization, a 14 gauge spring-loaded device was used to perform biopsy of a 0.8 cm mass at the right breast 12 o'clock position 4 cm from the nipple using an anterolateral approach. At the conclusion of the procedure a coil shaped tissue marker clip was deployed into the biopsy cavity. Follow up 2 view mammogram was performed and dictated separately. IMPRESSION: Ultrasound guided biopsy of a 0.8 cm mass at the right breast 12 o'clock position 4 cm from the nipple. No apparent complications. Electronically Signed: By: Sherron Ales M.D. On: 01/22/2024 11:48   MM 3D DIAGNOSTIC MAMMOGRAM UNILATERAL RIGHT BREAST Result Date: 01/17/2024 CLINICAL DATA:  Recall from screening for a possible right breast mass and right breast calcifications. EXAM: DIGITAL DIAGNOSTIC UNILATERAL RIGHT MAMMOGRAM WITH TOMOSYNTHESIS AND CAD; ULTRASOUND RIGHT BREAST LIMITED TECHNIQUE: Right digital  diagnostic mammography and breast tomosynthesis was performed. The images were evaluated with computer-aided detection. ; Targeted  ultrasound examination of the right breast was performed COMPARISON:  Previous exam(s). ACR Breast Density Category b: There are scattered areas of fibroglandular density. FINDINGS: On spot compression imaging, the possible mass noted on the current screening exam persists as an 8 mm irregular mass at 12 o'clock. There is a possible small satellite mass directly anterior to this measuring 4 mm in size. On magnification imaging, the calcifications in the lateral right breast are better visualized. There are a few punctate calcifications in this location, some of which are vascular, and overall appear unchanged compared to the 2021 exam consistent with a benign etiology. On physical exam, no discrete mass is palpated in the upper right breast. Targeted ultrasound is performed, showing a hypoechoic irregular mass at 12 o'clock, 4 cm from the nipple, measuring 8 x 7 x 7 mm. There is a directly adjacent 4 mm mass along its anterior margin. The findings are consistent with the mammographic abnormalities. Sonographic imaging of the right axilla demonstrates normal lymph nodes. No enlarged or abnormal lymph nodes. IMPRESSION: 1. Highly suspicious 8 mm mass in in the right breast at 12 o'clock, 4 cm the nipple, associated with a contiguous 4 mm mass along its anterior margin. 2. Benign calcifications in the lateral right breast. RECOMMENDATION: 1. Ultrasound-guided core needle biopsy the right breast mass at 12 o'clock. I have discussed the findings and recommendations with the patient. If applicable, a reminder letter will be sent to the patient regarding the next appointment. BI-RADS CATEGORY  5: Highly suggestive of malignancy. Electronically Signed   By: Amie Portland M.D.   On: 01/17/2024 12:00   [place] LIMITED ULTRASOUND INCLUDING AXILLA RIGHT BREAST Result Date: 01/17/2024 CLINICAL DATA:  Recall from screening for a possible right breast mass and right breast calcifications. EXAM: DIGITAL DIAGNOSTIC UNILATERAL RIGHT  MAMMOGRAM WITH TOMOSYNTHESIS AND CAD; ULTRASOUND RIGHT BREAST LIMITED TECHNIQUE: Right digital diagnostic mammography and breast tomosynthesis was performed. The images were evaluated with computer-aided detection. ; Targeted ultrasound examination of the right breast was performed COMPARISON:  Previous exam(s). ACR Breast Density Category b: There are scattered areas of fibroglandular density. FINDINGS: On spot compression imaging, the possible mass noted on the current screening exam persists as an 8 mm irregular mass at 12 o'clock. There is a possible small satellite mass directly anterior to this measuring 4 mm in size. On magnification imaging, the calcifications in the lateral right breast are better visualized. There are a few punctate calcifications in this location, some of which are vascular, and overall appear unchanged compared to the 2021 exam consistent with a benign etiology. On physical exam, no discrete mass is palpated in the upper right breast. Targeted ultrasound is performed, showing a hypoechoic irregular mass at 12 o'clock, 4 cm from the nipple, measuring 8 x 7 x 7 mm. There is a directly adjacent 4 mm mass along its anterior margin. The findings are consistent with the mammographic abnormalities. Sonographic imaging of the right axilla demonstrates normal lymph nodes. No enlarged or abnormal lymph nodes. IMPRESSION: 1. Highly suspicious 8 mm mass in in the right breast at 12 o'clock, 4 cm the nipple, associated with a contiguous 4 mm mass along its anterior margin. 2. Benign calcifications in the lateral right breast. RECOMMENDATION: 1. Ultrasound-guided core needle biopsy the right breast mass at 12 o'clock. I have discussed the findings and recommendations with the patient. If applicable, a reminder letter will  be sent to the patient regarding the next appointment. BI-RADS CATEGORY  5: Highly suggestive of malignancy. Electronically Signed   By: Amie Portland M.D.   On: 01/17/2024 12:00       IMPRESSION: Stage IA (cT1b, cN0, cM0) Right Breast UOQ, Invasive Ductal Carcinoma, ER+ / PR+ / Her2-, Grade 1  Patient will be a good candidate for breast conservation with radiotherapy to the right breast. We discussed the general course of radiation, potential side effects, and toxicities with radiation.  Considering her favorable, early-stage disease, radiation therapy offers minimal benefit for locoregional disease control. Therefore, radiotherapy is optional for her if she decides to undergo anti-estrogen therapy and surgical pathology remains favorable. We will see the patient after her surgery to review her pathology and to again discuss her treatment options.  At this point she wishes to be aggressive and to proceed with surgery,  radiation therapy and adjuvant hormonal therapy.  PLAN:  Genetics  Right breast lumpectomy  +/- adjuvant radiation therapy  Aromatase inhibitor    ------------------------------------------------   Bryan Lemma, PA-C   Billie Lade, PhD, MD   [facility]  Radiation Oncology Direct Dial: [phone]  Fax: [phone] [email or web address]    This document serves as a record of services personally performed by Antony Blackbird, MD and Bryan Lemma, PA-C. It was created on his behalf by Neena Rhymes, a trained medical scribe. The creation of this record is based on the scribe's personal observations and the provider's statements to them. This document has been checked and approved by the attending provider.

## 2024-01-31 NOTE — Progress Notes (Signed)
 [facility]   [phone] [phone]   Clinic New Consult Note   Patient Care Team: Plotnikov, Georgina Quint, MD as PCP - General Russella Dar Venita Lick, MD (Inactive) as Attending Physician (Gastroenterology) Sallye Lat, MD as Consulting Physician (Ophthalmology) Romualdo Bolk, MD (Inactive) as Consulting Physician (Obstetrics and Gynecology) Pershing Proud, RN as Oncology Nurse Navigator Donnelly Angelica, RN as Oncology Nurse Navigator [name], MD as Consulting Physician (General Surgery) Malachy Mood, MD as Consulting Physician (Hematology) Antony Blackbird, MD as Consulting Physician (Radiation Oncology) 01/31/2024  CHIEF COMPLAINTS/PURPOSE OF CONSULTATION:  Newly diagnosed right breast cancer  REFERRING PHYSICIAN: Breast center   Discussed the use of AI scribe software for clinical note transcription with the patient, who gave verbal consent to proceed.  History of Present Illness   The patient, a 78 year old female with a history of hypertension managed with amlodipine and irbesartan, presents for a consultation following a recent diagnosis of breast cancer.  She presents to clinic with her daughter today.  The cancer was discovered during a routine mammogram, which the patient tries to have annually, although her schedule was disrupted due to the COVID-19 pandemic. The patient did not notice any abnormalities or experience any pain in her breast. She reports no other symptoms or discomfort, except for occasional lower back pain, which she attributes to her age.  The patient is active and works a full-time job as a Interior and spatial designer, which involves standing for extended periods. She also has a history of osteopenia, which is being managed with vitamin D supplements. She had a bone density scan a month ago, which showed significant bone loss. She also has a history of compression fractures on the spine due to a fall seven years ago.  The  patient has a history of using estrogen vaginal cream for bladder support due to a pessary. She also takes amlodipine and irbesartan for hypertension, Prosol for acid reflux, eye drops, iron pills, and vitamin D. She occasionally takes Xanax as needed.         MEDICAL HISTORY:  Past Medical History:  Diagnosis Date   Adenomatous polyp of colon 2004   Allergic rhinitis    Anemia    Anxiety    Back pain    Bladder prolapse, female, acquired    Breast cancer (HCC)    Cataract 2018   Diverticulosis    GERD (gastroesophageal reflux disease)    Glaucoma    Hypertension    Lumbar compression fracture (HCC) 09/07/2016   x 2    Menopause    Urinary incontinence    Vitamin D deficiency     SURGICAL HISTORY: Past Surgical History:  Procedure Laterality Date   BREAST BIOPSY Right 01/22/2024   [place] RT BREAST BX W LOC DEV 1ST LESION IMG BX SPEC US GUIDE 01/22/2024 [facility] MAMMOGRAPHY   CATARACT EXTRACTION, BILATERAL  04/06/2017   with cypass stent implanted   COLONOSCOPY     cypess stent left eye Left 2018   POLYPECTOMY      SOCIAL HISTORY: Social History   Socioeconomic History   Marital status: Married    Spouse name: Zannie Cove" Bus[place]   Number of children: 2   Years of education: Not on file   Highest education level: Not on file  Occupational History   Occupation: Hair dresser  Tobacco Use   Smoking status: Never   Smokeless tobacco: Never  Vaping Use   Vaping status: Never Used  Substance and Sexual Activity  Alcohol use: No   Drug use: No   Sexual activity: Not Currently    Partners: Male    Birth control/protection: Post-menopausal    Comment: First IC @ 18, Partners-1, DES-neg  Other Topics Concern   Not on file  Social History Narrative   Patient is very active within her community. She still works as a Producer, television/film/video full time. She does gardening and yard work. She cooks big meals for her sister, who is caring for her husband who has dementia. She also  volunteers.    Social Drivers of Corporate investment banker Strain: Low Risk  (07/03/2023)   Overall Financial Resource Strain (CARDIA)    Difficulty of Paying Living Expenses: Not hard at all  Food Insecurity: No Food Insecurity (01/31/2024)   Hunger Vital Sign    Worried About Running Out of Food in the Last Year: Never true    Ran Out of Food in the Last Year: Never true  Transportation Needs: No Transportation Needs (01/31/2024)   PRAPARE - Administrator, Civil Service (Medical): No    Lack of Transportation (Non-Medical): No  Physical Activity: Sufficiently Active (07/03/2023)   Exercise Vital Sign    Days of Exercise per Week: 7 days    Minutes of Exercise per Session: 60 min  Stress: No Stress Concern Present (07/03/2023)   [employer] of Occupational Health - Occupational Stress Questionnaire    Feeling of Stress : Not at all  Social Connections: Socially Integrated (07/03/2023)   Social Connection and Isolation Panel [NHANES]    Frequency of Communication with Friends and Family: More than three times a week    Frequency of Social Gatherings with Friends and Family: More than three times a week    Attends Religious Services: More than 4 times per year    Active Member of [employer] or Organizations: Yes    Attends Engineer, structural: More than 4 times per year    Marital Status: Married  Catering manager Violence: Not At Risk (01/31/2024)   Humiliation, Afraid, Rape, and Kick questionnaire    Fear of Current or Ex-Partner: No    Emotionally Abused: No    Physically Abused: No    Sexually Abused: No    FAMILY HISTORY: Family History  Problem Relation Age of Onset   Stroke Mother 7   Hypertension Mother    Sudden death Mother    Anxiety disorder Mother    Lung cancer Father 69       smoker   Brain cancer Paternal Uncle        benign   Colon cancer Neg Hx    Esophageal cancer Neg Hx    Stomach cancer Neg Hx    Rectal cancer Neg Hx      ALLERGIES:  is allergic to benazepril hcl, levofloxacin, and mobic [meloxicam].  MEDICATIONS:  Current Outpatient Medications  Medication Sig Dispense Refill   acetaminophen (TYLENOL) 500 MG tablet Take 1,000 mg by mouth every 6 (six) hours as needed for mild pain.     ALPRAZolam (XANAX) 0.25 MG tablet TAKE 1 TABLET(0.25 MG) BY MOUTH TWICE DAILY AS NEEDED FOR ANXIETY 180 tablet 1   amLODipine (NORVASC) 2.5 MG tablet Take 1 tablet (2.5 mg total) by mouth daily. 90 tablet 3   dorzolamide-timolol (COSOPT) 22.3-6.8 MG/ML ophthalmic solution 1 drop 2 (two) times daily.     estradiol (ESTRACE) 0.1 MG/GM vaginal cream Place 1 g vaginally 2 (two) times a week. 42.5  g 2   fexofenadine (ALLEGRA) 180 MG tablet Take 180 mg by mouth daily.     ipratropium (ATROVENT) 0.06 % nasal spray Place 2 sprays into the nose 3 (three) times daily. Annual appt due must see provider for future refills 45 mL 3   irbesartan (AVAPRO) 150 MG tablet TAKE 1 TABLET(150 MG) BY MOUTH DAILY 90 tablet 3   IRON PO Take 65 mg by mouth daily.     latanoprost (XALATAN) 0.005 % ophthalmic solution INT 1 GTT IN OU HS     pantoprazole (PROTONIX) 40 MG tablet TAKE 1 TABLET(40 MG) BY MOUTH TWICE DAILY 180 tablet 3   VITAMIN D PO Take 1,000 Units by mouth daily.     No current facility-administered medications for this visit.    REVIEW OF SYSTEMS:   Constitutional: Denies fevers, chills or abnormal night sweats Eyes: Denies blurriness of vision, double vision or watery eyes Ears, nose, mouth, throat, and face: Denies mucositis or sore throat Respiratory: Denies cough, dyspnea or wheezes Cardiovascular: Denies palpitation, chest discomfort or lower extremity swelling Gastrointestinal:  Denies nausea, heartburn or change in bowel habits Skin: Denies abnormal skin rashes Lymphatics: Denies new lymphadenopathy or easy bruising Neurological:Denies numbness, tingling or new weaknesses Behavioral/Psych: Mood is stable, no new changes   All other systems were reviewed with the patient and are negative.  PHYSICAL EXAMINATION: ECOG PERFORMANCE STATUS: 0 - Asymptomatic  Vitals:   01/31/24 1306  BP: (!) 143/70  Pulse: 96  Resp: 18  Temp: 99.3 F (37.4 C)  SpO2: 98%   Filed Weights   01/31/24 1306  Weight: 193 lb 6.4 oz (87.7 kg)    GENERAL:alert, no distress and comfortable SKIN: skin color, texture, turgor are normal, no rashes or significant lesions EYES: normal, conjunctiva are pink and non-injected, sclera clear OROPHARYNX:no exudate, no erythema and lips, buccal mucosa, and tongue normal  NECK: supple, thyroid normal size, non-tender, without nodularity LYMPH:  no palpable lymphadenopathy in the cervical, axillary or inguinal LUNGS: clear to auscultation and percussion with normal breathing effort HEART: regular rate & rhythm and no murmurs and no lower extremity edema ABDOMEN:abdomen soft, non-tender and normal bowel sounds Musculoskeletal:no cyanosis of digits and no clubbing  PSYCH: alert & oriented x 3 with fluent speech NEURO: no focal motor/sensory deficits  Physical Exam   BREAST: Breast exam normal. Extra nipple under left breast.      LABORATORY DATA:  I have reviewed the data as listed    Latest Ref Rng & Units 01/31/2024   12:24 PM 03/13/2023   10:22 AM 08/30/2021    8:32 AM  CBC  WBC 4.0 - 10.5 K/uL 7.4  6.5  5.3   Hemoglobin 12.0 - 15.0 g/dL 16.1  09.6  04.5   Hematocrit 36.0 - 46.0 % 43.6  42.5  40.6   Platelets 150 - 400 K/uL 211  223.0  198.0     @cmpl @  RADIOGRAPHIC STUDIES: I have personally reviewed the radiological images as listed and agreed with the findings in the report. MM CLIP PLACEMENT RIGHT Addendum Date: 01/30/2024 ADDENDUM REPORT: 01/30/2024 13:34 ADDENDUM: ACR Breast Density Category b: There are scattered areas of fibroglandular density. Electronically Signed   By: Sherron Ales M.D.   On: 01/30/2024 13:34   Result Date: 01/30/2024 CLINICAL DATA:  Postprocedure  mammogram. EXAM: 3D DIAGNOSTIC RIGHT MAMMOGRAM POST ULTRASOUND BIOPSY COMPARISON:  Previous exam(s). FINDINGS: 3D Mammographic images were obtained following ultrasound guided biopsy of a 0.8 cm mass at the right  breast 12 o'clock position 4 cm from the nipple. The biopsy marking clip is in expected position at the site of biopsy. IMPRESSION: Appropriate positioning of the coil shaped biopsy marking clip at the site of biopsy in the upper central right breast at middle depth. Final Assessment: Post Procedure Mammograms for Marker Placement Electronically Signed: By: Sherron Ales M.D. On: 01/22/2024 11:51   [place] RT BREAST BX W LOC DEV 1ST LESION IMG BX SPEC US GUIDE Addendum Date: 01/26/2024 ADDENDUM REPORT: 01/26/2024 08:37 ADDENDUM: PATHOLOGY revealed: 1. Breast, right, needle core biopsy, 12:00 4 cmfn : - INVASIVE DUCTAL CARCINOMA WITH EXTRACELLULAR MUCIN. Microscopic Comment: Based on the biopsy, the carcinoma appears Nottingham grade 1 of 3 and measures 0.8 cm in greatest linear extent. Pathology results are CONCORDANT with imaging findings, per Dr. Sherron Ales. Pathology results and recommendations were discussed with patient via telephone on 01/23/2024 by Randa Lynn RN. Patient reported biopsy site doing well with no adverse symptoms, and only slight tenderness at the site. Post biopsy care instructions were reviewed, questions were answered and my direct phone number was provided. Patient was instructed to call [facility] for any additional questions or concerns related to biopsy site. RECOMMENDATIONS: 1. Surgical and oncological consultation. Patient was referred to [facility] with appointment on 01/31/2024. Patient is aware of [facility] appointment date and time. Pathology results reported by Randa Lynn RN on 01/23/2024. Electronically Signed   By: Sherron Ales M.D.   On: 01/26/2024 08:37   Result Date:  01/26/2024 CLINICAL DATA:  Highly suspicious 0.8 cm mass at the right breast 12 o'clock position 4 cm from the nipple. EXAM: ULTRASOUND GUIDED RIGHT BREAST CORE NEEDLE BIOPSY COMPARISON:  Previous exam(s). PROCEDURE: I met with the patient and we discussed the procedure of ultrasound-guided biopsy, including benefits and alternatives. We discussed the high likelihood of a successful procedure. We discussed the risks of the procedure, including infection, bleeding, tissue injury, clip migration, and inadequate sampling. Informed written consent was given. The usual time-out protocol was performed immediately prior to the procedure. Lesion quadrant: Upper central Using sterile technique and 1% Lidocaine as local anesthetic, under direct ultrasound visualization, a 14 gauge spring-loaded device was used to perform biopsy of a 0.8 cm mass at the right breast 12 o'clock position 4 cm from the nipple using an anterolateral approach. At the conclusion of the procedure a coil shaped tissue marker clip was deployed into the biopsy cavity. Follow up 2 view mammogram was performed and dictated separately. IMPRESSION: Ultrasound guided biopsy of a 0.8 cm mass at the right breast 12 o'clock position 4 cm from the nipple. No apparent complications. Electronically Signed: By: Sherron Ales M.D. On: 01/22/2024 11:48   MM 3D DIAGNOSTIC MAMMOGRAM UNILATERAL RIGHT BREAST Result Date: 01/17/2024 CLINICAL DATA:  Recall from screening for a possible right breast mass and right breast calcifications. EXAM: DIGITAL DIAGNOSTIC UNILATERAL RIGHT MAMMOGRAM WITH TOMOSYNTHESIS AND CAD; ULTRASOUND RIGHT BREAST LIMITED TECHNIQUE: Right digital diagnostic mammography and breast tomosynthesis was performed. The images were evaluated with computer-aided detection. ; Targeted ultrasound examination of the right breast was performed COMPARISON:  Previous exam(s). ACR Breast Density Category b: There are scattered areas of fibroglandular density.  FINDINGS: On spot compression imaging, the possible mass noted on the current screening exam persists as an 8 mm irregular mass at 12 o'clock. There is a possible small satellite mass directly anterior to this measuring 4 mm in  size. On magnification imaging, the calcifications in the lateral right breast are better visualized. There are a few punctate calcifications in this location, some of which are vascular, and overall appear unchanged compared to the 2021 exam consistent with a benign etiology. On physical exam, no discrete mass is palpated in the upper right breast. Targeted ultrasound is performed, showing a hypoechoic irregular mass at 12 o'clock, 4 cm from the nipple, measuring 8 x 7 x 7 mm. There is a directly adjacent 4 mm mass along its anterior margin. The findings are consistent with the mammographic abnormalities. Sonographic imaging of the right axilla demonstrates normal lymph nodes. No enlarged or abnormal lymph nodes. IMPRESSION: 1. Highly suspicious 8 mm mass in in the right breast at 12 o'clock, 4 cm the nipple, associated with a contiguous 4 mm mass along its anterior margin. 2. Benign calcifications in the lateral right breast. RECOMMENDATION: 1. Ultrasound-guided core needle biopsy the right breast mass at 12 o'clock. I have discussed the findings and recommendations with the patient. If applicable, a reminder letter will be sent to the patient regarding the next appointment. BI-RADS CATEGORY  5: Highly suggestive of malignancy. Electronically Signed   By: Amie Portland M.D.   On: 01/17/2024 12:00   [place] LIMITED ULTRASOUND INCLUDING AXILLA RIGHT BREAST Result Date: 01/17/2024 CLINICAL DATA:  Recall from screening for a possible right breast mass and right breast calcifications. EXAM: DIGITAL DIAGNOSTIC UNILATERAL RIGHT MAMMOGRAM WITH TOMOSYNTHESIS AND CAD; ULTRASOUND RIGHT BREAST LIMITED TECHNIQUE: Right digital diagnostic mammography and breast tomosynthesis was performed. The images were  evaluated with computer-aided detection. ; Targeted ultrasound examination of the right breast was performed COMPARISON:  Previous exam(s). ACR Breast Density Category b: There are scattered areas of fibroglandular density. FINDINGS: On spot compression imaging, the possible mass noted on the current screening exam persists as an 8 mm irregular mass at 12 o'clock. There is a possible small satellite mass directly anterior to this measuring 4 mm in size. On magnification imaging, the calcifications in the lateral right breast are better visualized. There are a few punctate calcifications in this location, some of which are vascular, and overall appear unchanged compared to the 2021 exam consistent with a benign etiology. On physical exam, no discrete mass is palpated in the upper right breast. Targeted ultrasound is performed, showing a hypoechoic irregular mass at 12 o'clock, 4 cm from the nipple, measuring 8 x 7 x 7 mm. There is a directly adjacent 4 mm mass along its anterior margin. The findings are consistent with the mammographic abnormalities. Sonographic imaging of the right axilla demonstrates normal lymph nodes. No enlarged or abnormal lymph nodes. IMPRESSION: 1. Highly suspicious 8 mm mass in in the right breast at 12 o'clock, 4 cm the nipple, associated with a contiguous 4 mm mass along its anterior margin. 2. Benign calcifications in the lateral right breast. RECOMMENDATION: 1. Ultrasound-guided core needle biopsy the right breast mass at 12 o'clock. I have discussed the findings and recommendations with the patient. If applicable, a reminder letter will be sent to the patient regarding the next appointment. BI-RADS CATEGORY  5: Highly suggestive of malignancy. Electronically Signed   By: Amie Portland M.D.   On: 01/17/2024 12:00    ASSESSMENT & PLAN:  78 year old female with screening discovered right breast cancer    Invasive Ductal Carcinoma of the Right Breast, ER+/PR+/HER2-, stage IA,  G1 Newly diagnosed stage 1A invasive ductal carcinoma, ER/PR positive, HER2 negative. Detected on screening mammogram with no palpable mass  or skin changes. Excellent prognosis with >90% chance of cure. Discussed treatment options: surgery, radiation, and anti-estrogen therapy. Emphasized systemic treatment to reduce recurrence risk. Tamoxifen benefits: bone strengthening, reduced joint pain, use of vaginal estrogen cream. Risks: hot flashes, weight gain, blood clots, slight increase in endometrial cancer risk. Patient prefers tamoxifen for bone benefits and lower joint pain risk. - Proceed with surgery -Due to advanced age and favorable prognostic panel, I do not recommend Oncotype test. - Consider radiation therapy post-surgery - Recommend tamoxifen for 5 years post-surgery - Discuss vaginal estrogen cream with gynecologist - Encourage regular breast self-exams and annual mammograms  Osteopenia Osteopenia with significant bone loss, near osteoporosis. Compression fractures history. Recent bone density scan confirms diagnosis. Currently on vitamin D supplementation. Discussed tamoxifen's bone density benefits. - Continue vitamin D supplementation - Consider tamoxifen for bone-strengthening benefits - Discuss Prolia with primary care physician  Hypertension Well-controlled on amlodipine and irbesartan. No recent issues reported. - Continue amlodipine - Continue irbesartan  General Health Maintenance Generally active with regular health screenings. Missed some screenings during COVID-19 but has resumed regular check-ups. - Encourage annual mammograms - Encourage regular colonoscopies - Maintain active lifestyle and healthy diet to manage weight and cholesterol   Plan -She will proceed with right breast lumpectomy with Dr. Magnus Ivan - Schedule post-surgery follow-up with me if adjuvant radiation is not needed, to start her on tamoxifen.     No orders of the defined types were placed in  this encounter.   All questions were answered. The patient knows to call the clinic with any problems, questions or concerns. I spent 45 minutes counseling the patient face to face. The total time spent in the appointment was 50 minutes and more than 50% was on counseling.     Malachy Mood, MD 01/31/2024

## 2024-01-31 NOTE — Progress Notes (Signed)
 REFERRING PROVIDER: Malachy Mood, MD [address] [place],  [place] [address]  PRIMARY PROVIDER:  Plotnikov, Georgina Quint, MD  PRIMARY REASON FOR VISIT:  1. Malignant neoplasm of upper-outer quadrant of right breast in female, estrogen receptor positive (HCC)      HISTORY OF PRESENT ILLNESS:   Ms. Man, a 78 y.o. female, was seen for a [place] cancer genetics consultation at the request of Dr. Mosetta Putt due to a personal and family history of cancer.  Ms. Thurow presents to clinic today to discuss the possibility of a hereditary predisposition to cancer, genetic testing, and to further clarify her future cancer risks, as well as potential cancer risks for family members.   In February 2025, at the age of 72, Ms. Kruschke was diagnosed with cancer of the right breast. The treatment plan includes lumpectomy and radiation. She has a history of colon polyps including 2 tubular adenomas, 1 hyperplastic polyp and 7 sessile serrated adenomas.   CANCER HISTORY:  Oncology History  Malignant neoplasm of upper-outer quadrant of right breast in female, estrogen receptor positive (HCC)  01/22/2024 Cancer Staging   Staging form: Breast, AJCC 8th Edition - Clinical stage from 01/22/2024: Stage IA (cT1b, cN0, cM0, G1, ER+, PR+, HER2-) - Signed by Malachy Mood, MD on 01/30/2024 Stage prefix: Initial diagnosis Histologic grading system: 3 grade system   01/29/2024 Initial Diagnosis   Malignant neoplasm of upper-outer quadrant of right breast in female, estrogen receptor positive (HCC)     Past Medical History:  Diagnosis Date   Adenomatous polyp of colon 2004   Allergic rhinitis    Anemia    Anxiety    Back pain    Bladder prolapse, female, acquired    Cataract 2018   Diverticulosis    GERD (gastroesophageal reflux disease)    Glaucoma    Hypertension    Lumbar compression fracture (HCC) 09/07/2016   x 2    Menopause    Urinary incontinence    Vitamin D deficiency     Past Surgical History:   Procedure Laterality Date   BREAST BIOPSY Right 01/22/2024   [place] RT BREAST BX W LOC DEV 1ST LESION IMG BX SPEC US GUIDE 01/22/2024 [facility] MAMMOGRAPHY   CATARACT EXTRACTION, BILATERAL  04/06/2017   with cypass stent implanted   COLONOSCOPY     cypess stent left eye Left 2018   POLYPECTOMY      Social History   Socioeconomic History   Marital status: Married    Spouse name: Zannie Cove" Bircher   Number of children: 2   Years of education: Not on file   Highest education level: Not on file  Occupational History   Occupation: Hair dresser  Tobacco Use   Smoking status: Never   Smokeless tobacco: Never  Vaping Use   Vaping status: Never Used  Substance and Sexual Activity   Alcohol use: No   Drug use: No   Sexual activity: Not Currently    Partners: Male    Birth control/protection: Post-menopausal    Comment: First IC @ 18, Partners-1, DES-neg  Other Topics Concern   Not on file  Social History Narrative   Patient is very active within her community. She still works as a Producer, television/film/video full time. She does gardening and yard work. She cooks big meals for her sister, who is caring for her husband who has dementia. She also volunteers.    Social Drivers of Health   Financial Resource Strain: Low Risk  (07/03/2023)  Overall Financial Resource Strain (CARDIA)    Difficulty of Paying Living Expenses: Not hard at all  Food Insecurity: No Food Insecurity (07/03/2023)   Hunger Vital Sign    Worried About Running Out of Food in the Last Year: Never true    Ran Out of Food in the Last Year: Never true  Transportation Needs: No Transportation Needs (07/03/2023)   PRAPARE - Administrator, Civil Service (Medical): No    Lack of Transportation (Non-Medical): No  Physical Activity: Sufficiently Active (07/03/2023)   Exercise Vital Sign    Days of Exercise per Week: 7 days    Minutes of Exercise per Session: 60 min  Stress: No Stress Concern Present (07/03/2023)   [employer] of Occupational Health - Occupational Stress Questionnaire    Feeling of Stress : Not at all  Social Connections: Socially Integrated (07/03/2023)   Social Connection and Isolation Panel [NHANES]    Frequency of Communication with Friends and Family: More than three times a week    Frequency of Social Gatherings with Friends and Family: More than three times a week    Attends Religious Services: More than 4 times per year    Active Member of [employer] or Organizations: Yes    Attends Engineer, structural: More than 4 times per year    Marital Status: Married     FAMILY HISTORY:  We obtained a detailed, 4-generation family history.  Significant diagnoses are listed below: Family History  Problem Relation Age of Onset   Stroke Mother 69   Hypertension Mother    Sudden death Mother    Anxiety disorder Mother    Lung cancer Father 77       smoker   Brain cancer Paternal Uncle        benign   Colon cancer Neg Hx    Esophageal cancer Neg Hx    Stomach cancer Neg Hx    Rectal cancer Neg Hx      The patient has a son and daughter who are cancer free.  She reports that her father had lung cancer and was a smoker.  He died at 16.  She also reports that a paternal uncle had a benign brain tumor.  There was no other reported family history of cancer.    Ms. Rettke is unaware of previous family history of genetic testing for hereditary cancer risks. Patient's maternal ancestors are of [place] descent, and paternal ancestors are of [place] and [place] descent. There is no reported Ashkenazi Jewish ancestry. There is no known consanguinity.  GENETIC COUNSELING ASSESSMENT: Ms. Moskal is a 78 y.o. female with a personal and family history of cancer.  Given her diagnosis of breast cancer she meets ASBrS criteria for genetic testing. We, therefore, discussed and recommended the following at today's visit.   DISCUSSION: We discussed that, in general, most cancer is not inherited in  families, but instead is sporadic or familial. Sporadic cancers occur by chance and typically happen at older ages (>50 years) as this type of cancer is caused by genetic changes acquired during an individual's lifetime. Some families have more cancers than would be expected by chance; however, the ages or types of cancer are not consistent with a known genetic mutation or known genetic mutations have been ruled out. This type of familial cancer is thought to be due to a combination of multiple genetic, environmental, hormonal, and lifestyle factors. While this combination of factors likely increases the risk of cancer,  the exact source of this risk is not currently identifiable or testable.  We discussed that 5 - 10% of breast cancer is hereditary, with most cases associated with BRCA mutations.  There are other genes that can be associated with hereditary breast cancer syndromes.  These include ATM, CHEK2 and PALB2.  We discussed that testing is beneficial for several reasons including knowing how to follow individuals after completing their treatment, identifying whether potential treatment options such as PARP inhibitors would be beneficial, and understand if other family members could be at risk for cancer and allow them to undergo genetic testing.   We reviewed the characteristics, features and inheritance patterns of hereditary cancer syndromes. We also discussed genetic testing, including the appropriate family members to test, the process of testing, insurance coverage and turn-around-time for results. We discussed the implications of a negative, positive, carrier and/or variant of uncertain significant result. Ms. Ensz  was offered a common hereditary cancer panel (36+ genes) and an expanded pan-cancer panel (70+ genes). Ms. Champa was informed of the benefits and limitations of each panel, including that expanded pan-cancer panels contain genes that do not have clear management guidelines at this point  in time.  We also discussed that as the number of genes included on a panel increases, the chances of variants of uncertain significance increases. Ms. Scarola decided to pursue genetic testing for the CancerNext-Expanded+RNAinsight gene panel.   The CancerNext-Expanded gene panel offered by [facility] and includes sequencing, rearrangement, and RNA analysis for the following 76 genes: AIP, ALK, APC, ATM, AXIN2, BAP1, BARD1, BMPR1A, BRCA1, BRCA2, BRIP1, CDC73, CDH1, CDK4, CDKN1B, CDKN2A, CEBPA, CHEK2, CTNNA1, DDX41, DICER1, ETV6, FH, FLCN, GATA2, LZTR1, MAX, MBD4, MEN1, MET, MLH1, MSH2, MSH3, MSH6, MUTYH, NF1, NF2, NTHL1, PALB2, PHOX2B, PMS2, POT1, PRKAR1A, PTCH1, PTEN, RAD51C, RAD51D, RB1, RET, RUNX1, SDHA, SDHAF2, SDHB, SDHC, SDHD, SMAD4, SMARCA4, SMARCB1, SMARCE1, STK11, SUFU, TMEM127, TP53, TSC1, TSC2, VHL, and WT1 (sequencing and deletion/duplication); EGFR, HOXB13, KIT, MITF, PDGFRA, POLD1, and POLE (sequencing only); EPCAM and GREM1 (deletion/duplication only).    Based on Ms. Earp's personal history of breast cancer, she meets ASBrS criteria for genetic testing. Despite that she meets criteria, she may still have an out of pocket cost. We discussed that if her out of pocket cost for testing is over $100, the laboratory will call and confirm whether she wants to proceed with testing.  If the out of pocket cost of testing is less than $100 she will be billed by the genetic testing laboratory.   PLAN: After considering the risks, benefits, and limitations, Ms. Maino provided informed consent to pursue genetic testing and the blood sample was sent to [facility] for analysis of the CancerNext-Expanded+RNAinsight. Results should be available within approximately 2-3 weeks' time, at which point they will be disclosed by telephone to Ms. Delconte, as will any additional recommendations warranted by these results. Ms. Tusing will receive a summary of her genetic counseling visit and a copy of her  results once available. This information will also be available in Epic.   Lastly, we encouraged Ms. Eldred to remain in contact with cancer genetics annually so that we can continuously update the family history and inform her of any changes in cancer genetics and testing that may be of benefit for this family.   Ms. Herald questions were answered to her satisfaction today. Our contact information was provided should additional questions or concerns arise. Thank you for the referral and allowing [place] to share in the care  of your patient.   Brycen Bean P. Lowell Guitar, MS, CGC Licensed, Patent attorney [email or web address] phone: [phone]  30 minutes were spent on the date of the encounter in service to the patient including preparation, face-to-face consultation, documentation and care coordination.  The patient brought her daughter. Drs. Meliton Rattan, and/or [place] were available for questions, if needed..    _______________________________________________________________________ For Office Staff:  Number of people involved in session: 2 Was an Intern/ student involved with case: no

## 2024-01-31 NOTE — Progress Notes (Signed)
 [facility] Multidisciplinary Clinic Spiritual Care Note  Met with Harshini in Breast Multidisciplinary Clinic to introduce Support Center team/resources.  she completed SDOH screening; results follow below.  SDOH Interventions    Flowsheet Row Clinical Support from 07/03/2023 in [facility] Clinical Support from 06/27/2022 in [facility] Clinical Support from 06/17/2020 in [facility]  SDOH Interventions     Food Insecurity Interventions Intervention Not Indicated Intervention Not Indicated Intervention Not Indicated  Housing Interventions Intervention Not Indicated Intervention Not Indicated Intervention Not Indicated  Transportation Interventions Intervention Not Indicated Intervention Not Indicated Intervention Not Indicated  Utilities Interventions Intervention Not Indicated -- --  Alcohol Usage Interventions Intervention Not Indicated (Score <7) -- --  Financial Strain Interventions Intervention Not Indicated Intervention Not Indicated Intervention Not Indicated  Physical Activity Interventions Intervention Not Indicated Intervention Not Indicated Intervention Not Indicated  Stress Interventions Intervention Not Indicated Intervention Not Indicated Intervention Not Indicated  Social Connections Interventions Intervention Not Indicated Intervention Not Indicated Intervention Not Indicated  Health Literacy Interventions Intervention Not Indicated -- --       SDOH Screenings   Food Insecurity: No Food Insecurity (01/31/2024)  Housing: Low Risk  (01/31/2024)  Transportation Needs: No Transportation Needs (01/31/2024)  Utilities: Not At Risk (01/31/2024)  Alcohol Screen: Low Risk  (07/03/2023)  Depression (PHQ2-9): Low Risk  (10/09/2023)  Financial Resource Strain: Low Risk  (07/03/2023)  Physical Activity: Sufficiently Active (07/03/2023)  Social Connections: Socially Integrated (07/03/2023)  Stress:  No Stress Concern Present (07/03/2023)  Tobacco Use: Low Risk  (01/31/2024)  Health Literacy: Adequate Health Literacy (07/03/2023)   Chaplain and patient discussed common feelings and emotions when being diagnosed with cancer, and the importance of support during treatment.  Chaplain informed patient of the support team and support services at [facility].  Chaplain provided contact information and encouraged patient to call with any questions or concerns.  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  Follow up needed: Yes.   Ms Schwenke welcomes a follow-up call in ca two weeks for spiritual/emotional check-in.   [address] [place], [place], [facility] Pager [phone] Voicemail [phone]

## 2024-02-01 ENCOUNTER — Other Ambulatory Visit: Payer: Self-pay | Admitting: Surgery

## 2024-02-01 DIAGNOSIS — Z853 Personal history of malignant neoplasm of breast: Secondary | ICD-10-CM

## 2024-02-02 ENCOUNTER — Telehealth: Payer: Self-pay | Admitting: Hematology

## 2024-02-02 NOTE — Telephone Encounter (Signed)
 [name]

## 2024-02-02 NOTE — Telephone Encounter (Signed)
 Scheduled patient's appts. Advised patient to contact us if rescheduling is needed.

## 2024-02-05 ENCOUNTER — Telehealth: Payer: Self-pay | Admitting: *Deleted

## 2024-02-05 NOTE — Telephone Encounter (Signed)
 [employer] 2021-05 - Specimen Collection Study to Evaluate Biomarkers in Subjects with Cancer   Called patient to follow up on the above study and see if she has any questions and if she wants to participate. Answered patient's questions. Reminded patient that participation is voluntary. Patient stated she wants to participate and agreed to come in next week on Monday 3/10 at 12 noon for research consent and then blood draw.  Encouraged patient to call back with any questions before the appointment and she verbalized understanding.  Domenica Reamer, BSN, RN, [employer] Research Nurse II [phone] 02/05/2024 11:37 AM

## 2024-02-06 ENCOUNTER — Encounter: Payer: Self-pay | Admitting: *Deleted

## 2024-02-06 ENCOUNTER — Telehealth: Payer: Self-pay | Admitting: *Deleted

## 2024-02-06 NOTE — Telephone Encounter (Signed)
 Spoke to pt concerning [facility] from 01/31/24. Denies questions or concerns regarding dx or treatment care plan. Encourage pt to call with needs. Received verbal understanding.

## 2024-02-07 ENCOUNTER — Encounter (HOSPITAL_BASED_OUTPATIENT_CLINIC_OR_DEPARTMENT_OTHER): Payer: Self-pay | Admitting: Surgery

## 2024-02-07 DIAGNOSIS — H35373 Puckering of macula, bilateral: Secondary | ICD-10-CM | POA: Diagnosis not present

## 2024-02-07 DIAGNOSIS — H401122 Primary open-angle glaucoma, left eye, moderate stage: Secondary | ICD-10-CM | POA: Diagnosis not present

## 2024-02-07 DIAGNOSIS — H401111 Primary open-angle glaucoma, right eye, mild stage: Secondary | ICD-10-CM | POA: Diagnosis not present

## 2024-02-07 DIAGNOSIS — Z961 Presence of intraocular lens: Secondary | ICD-10-CM | POA: Diagnosis not present

## 2024-02-07 DIAGNOSIS — H26492 Other secondary cataract, left eye: Secondary | ICD-10-CM | POA: Diagnosis not present

## 2024-02-08 ENCOUNTER — Encounter (HOSPITAL_BASED_OUTPATIENT_CLINIC_OR_DEPARTMENT_OTHER)
Admission: RE | Admit: 2024-02-08 | Discharge: 2024-02-08 | Disposition: A | Discharge status: Home / Self Care | Source: Ambulatory Visit | Attending: Surgery | Admitting: Surgery

## 2024-02-08 DIAGNOSIS — Z01812 Encounter for preprocedural laboratory examination: Secondary | ICD-10-CM | POA: Diagnosis not present

## 2024-02-08 MED ORDER — CHLORHEXIDINE GLUCONATE CLOTH 2 % EX PADS
6.0000 | MEDICATED_PAD | Freq: Once | CUTANEOUS | Status: DC
Start: 2024-02-08 — End: 2024-02-13

## 2024-02-08 MED ORDER — ENSURE PRE-SURGERY PO LIQD: 296.0000 mL | Freq: Once | ORAL | Status: DC

## 2024-02-08 MED ORDER — CHLORHEXIDINE GLUCONATE CLOTH 2 % EX PADS: 6.0000 | MEDICATED_PAD | Freq: Once | CUTANEOUS | Status: DC

## 2024-02-08 NOTE — Progress Notes (Signed)

## 2024-02-12 ENCOUNTER — Ambulatory Visit
Admission: RE | Admit: 2024-02-12 | Discharge: 2024-02-12 | Disposition: A | Discharge status: Home / Self Care | Payer: BLUE CROSS/BLUE SHIELD | Source: Ambulatory Visit | Attending: Surgery | Admitting: Surgery

## 2024-02-12 ENCOUNTER — Inpatient Hospital Stay: Attending: Hematology | Admitting: *Deleted

## 2024-02-12 ENCOUNTER — Inpatient Hospital Stay

## 2024-02-12 DIAGNOSIS — M1990 Unspecified osteoarthritis, unspecified site: Secondary | ICD-10-CM | POA: Insufficient documentation

## 2024-02-12 DIAGNOSIS — Z1732 Human epidermal growth factor receptor 2 negative status: Secondary | ICD-10-CM | POA: Insufficient documentation

## 2024-02-12 DIAGNOSIS — N8110 Cystocele, unspecified: Secondary | ICD-10-CM | POA: Insufficient documentation

## 2024-02-12 DIAGNOSIS — Z881 Allergy status to other antibiotic agents status: Secondary | ICD-10-CM | POA: Insufficient documentation

## 2024-02-12 DIAGNOSIS — R232 Flushing: Secondary | ICD-10-CM | POA: Insufficient documentation

## 2024-02-12 DIAGNOSIS — C50411 Malignant neoplasm of upper-outer quadrant of right female breast: Secondary | ICD-10-CM

## 2024-02-12 DIAGNOSIS — Z79810 Long term (current) use of selective estrogen receptor modulators (SERMs): Secondary | ICD-10-CM | POA: Insufficient documentation

## 2024-02-12 DIAGNOSIS — Z170 Estrogen receptor positive status [ER+]: Secondary | ICD-10-CM | POA: Insufficient documentation

## 2024-02-12 DIAGNOSIS — Z888 Allergy status to other drugs, medicaments and biological substances status: Secondary | ICD-10-CM | POA: Insufficient documentation

## 2024-02-12 DIAGNOSIS — F419 Anxiety disorder, unspecified: Secondary | ICD-10-CM | POA: Insufficient documentation

## 2024-02-12 DIAGNOSIS — Z1721 Progesterone receptor positive status: Secondary | ICD-10-CM | POA: Insufficient documentation

## 2024-02-12 DIAGNOSIS — Z79899 Other long term (current) drug therapy: Secondary | ICD-10-CM | POA: Insufficient documentation

## 2024-02-12 DIAGNOSIS — M8580 Other specified disorders of bone density and structure, unspecified site: Secondary | ICD-10-CM | POA: Insufficient documentation

## 2024-02-12 DIAGNOSIS — C50811 Malignant neoplasm of overlapping sites of right female breast: Secondary | ICD-10-CM | POA: Diagnosis not present

## 2024-02-12 DIAGNOSIS — Z860101 Personal history of adenomatous and serrated colon polyps: Secondary | ICD-10-CM | POA: Insufficient documentation

## 2024-02-12 DIAGNOSIS — I10 Essential (primary) hypertension: Secondary | ICD-10-CM | POA: Insufficient documentation

## 2024-02-12 DIAGNOSIS — Z853 Personal history of malignant neoplasm of breast: Secondary | ICD-10-CM

## 2024-02-12 HISTORY — PX: BREAST BIOPSY: SHX20

## 2024-02-12 LAB — RESEARCH LABS

## 2024-02-12 NOTE — Anesthesia Preprocedure Evaluation (Signed)
 Anesthesia Evaluation  Patient identified by MRN, date of birth, ID band Patient awake    Reviewed: Allergy & Precautions, NPO status , Patient's Chart, lab work & pertinent test results  Airway Mallampati: III  TM Distance: >3 FB Neck ROM: Full    Dental  (+) Teeth Intact, Dental Advisory Given   Pulmonary neg pulmonary ROS   Pulmonary exam normal breath sounds clear to auscultation       Cardiovascular hypertension (128/69 preop), Pt. on medications Normal cardiovascular exam Rhythm:Regular Rate:Normal     Neuro/Psych  PSYCHIATRIC DISORDERS Anxiety     negative neurological ROS     GI/Hepatic Neg liver ROS, hiatal hernia,GERD  Medicated and Controlled,,  Endo/Other  BMI 34  Renal/GU negative Renal ROS  negative genitourinary   Musculoskeletal negative musculoskeletal ROS (+)    Abdominal  (+) + obese  Peds  Hematology negative hematology ROS (+) Hb 13.9, plt 211   Anesthesia Other Findings   Reproductive/Obstetrics negative OB ROS                             Anesthesia Physical Anesthesia Plan  ASA: 2  Anesthesia Plan: General   Post-op Pain Management: Tylenol PO (pre-op)*   Induction: Intravenous  PONV Risk Score and Plan: 3 and Ondansetron, Dexamethasone and Treatment may vary due to age or medical condition  Airway Management Planned: LMA  Additional Equipment: None  Intra-op Plan:   Post-operative Plan: Extubation in OR  Informed Consent: I have reviewed the patients History and Physical, chart, labs and discussed the procedure including the risks, benefits and alternatives for the proposed anesthesia with the patient or authorized representative who has indicated his/her understanding and acceptance.     Dental advisory given  Plan Discussed with: CRNA  Anesthesia Plan Comments:        Anesthesia Quick Evaluation

## 2024-02-12 NOTE — H&P (Signed)
 REFERRING PHYSICIAN: Malachy Mood, MD PROVIDER: Wayne Both, MD MRN: [id number] DOB: 05/07/1946  Subjective   Chief Complaint: New Consultation (Breast Cancer)  History of Present Illness: Diane Sawyer is a 78 y.o. female who is seen today as an office consultation for evaluation of New Consultation (Breast Cancer)  This is a 78 year old female was found on recent screening mammography to have a mass in the right breast. The mass on ultrasound measured 0.8 cm in the upper central breast. Ultrasound the axilla was unremarkable. A biopsy of the mass showed a right invasive ductal carcinoma which was grade 1. It was 100% ER positive, 100% PR positive, HER2 negative, and had a Ki-67 of 5%. She has had no previous problems regarding her breast. She denies nipple discharge. She has no previous surgical history. She is very active. She has no cardiopulmonary complaints  Review of Systems: A complete review of systems was obtained from the patient. I have reviewed this information and discussed as appropriate with the patient. See HPI as well for other ROS.  ROS   Medical History: Past Medical History:  Diagnosis Date  Anemia  Anxiety  Arthritis  GERD (gastroesophageal reflux disease)  Glaucoma (increased eye pressure)  History of cancer  Hypertension   There is no problem list on file for this patient.  Past Surgical History:  Procedure Laterality Date  CATARACT EXTRACTION    Allergies  Allergen Reactions  Benazepril Hcl Unknown  REACTION: achy- makes her feel strange.  Levofloxacin Nausea  Meloxicam Other (See Comments)  Felt like chest going to explode   Current Outpatient Medications on File Prior to Visit  Medication Sig Dispense Refill  ALPRAZolam (XANAX) 0.25 MG tablet TAKE 1 TABLET(0.25 MG) BY MOUTH TWICE DAILY AS NEEDED FOR ANXIETY  amLODIPine (NORVASC) 2.5 MG tablet Take 2.5 mg by mouth once daily  ipratropium (ATROVENT) 0.06 % nasal spray Place 2 sprays into  one nostril  irbesartan (AVAPRO) 150 MG tablet Take 150 mg by mouth once daily  methocarbamoL (ROBAXIN) 500 MG tablet Take 500 mg by mouth 4 (four) times daily  pantoprazole (PROTONIX) 40 MG DR tablet Take 40 mg by mouth 2 (two) times daily   No current facility-administered medications on file prior to visit.   Family History  Problem Relation Age of Onset  Stroke Mother  High blood pressure (Hypertension) Mother  Deep vein thrombosis (DVT or abnormal blood clot formation) Mother    Social History   Tobacco Use  Smoking Status Never  Smokeless Tobacco Never    Social History   Socioeconomic History  Marital status: Married  Tobacco Use  Smoking status: Never  Smokeless tobacco: Never  Vaping Use  Vaping status: Never Used  Substance and Sexual Activity  Alcohol use: Not Currently  Drug use: Never   Social Drivers of Health   Financial Resource Strain: Low Risk (07/03/2023)  Received from [facility]  Overall Financial Resource Strain (CARDIA)  Difficulty of Paying Living Expenses: Not hard at all  Food Insecurity: No Food Insecurity (07/03/2023)  [facility] from [facility]  Hunger Vital Sign  Worried About Running Out of Food in the Last Year: Never true  Ran Out of Food in the Last Year: Never true  Transportation Needs: No Transportation Needs (07/03/2023)  Received from [facility] - Transportation  Lack of Transportation (Medical): No  Lack of Transportation (Non-Medical): No  Physical Activity: Sufficiently Active (07/03/2023)  Received from [facility]  Exercise Vital Sign  Days of Exercise  per Week: 7 days  Minutes of Exercise per Session: 60 min  Stress: No Stress Concern Present (07/03/2023)  Received from [facility] - Occupational Stress Questionnaire  Feeling of Stress : Not at all  Social Connections: Socially Integrated (07/03/2023)  [facility]  Social Connection and Isolation Panel  [NHANES]  Frequency of Communication with Friends and Family: More than three times a week  Frequency of Social Gatherings with Friends and Family: More than three times a week  Attends Religious Services: More than 4 times per year  Active Member of [employer] or Organizations: Yes  Attends Engineer, structural: More than 4 times per year  Marital Status: Married   Objective:   Vitals:   PainSc: 0-No pain   There is no height or weight on file to calculate BMI.  Physical Exam   She appears well on exam  There is very minimal bruising from the biopsy on the right breast. There are no palpable breast masses. The nipple areolar complex is normal in appearance  Is no axillary adenopathy  Labs, Imaging and Diagnostic Testing: I have reviewed her mammograms, ultrasound, and pathology results  Assessment and Plan:   Diagnoses and all orders for this visit:  Invasive ductal carcinoma of breast, female, right (CMS/HHS-HCC)   I discussed the diagnosis with the patient and her family. They have also discussed her multidisciplinary breast cancer conference this morning. From a surgical standpoint we then discussed breast conservation versus mastectomy. She is interested in breast conservation. We next discussed proceeding with a radioactive seed guided right breast lumpectomy. I explained the surgical procedure in detail. I discussed the risks which includes but is not limited to bleeding, infection, injury to surrounding structures, the need for further surgery if margins are positive, cardiopulmonary issues with anesthesia, postoperative recovery, etc. She understands and wishes to proceed with surgery which will be scheduled.

## 2024-02-12 NOTE — Research (Signed)
 [employer] 2021-05 - Specimen Collection Study to Evaluate Biomarkers in Subjects with Cancer    This Nurse has reviewed this patient's inclusion and exclusion criteria as a second review and confirms Diane Sawyer is eligible for study participation.  Patient may continue with enrollment.  Margret Chance Tonie Vizcarrondo, RN, BSN, [facility]  Her  [facility] Clinical Research Nurse [facility] Direct Dial [phone] 02/12/2024 12:07 PM

## 2024-02-12 NOTE — Research (Signed)
 [employer] 2021-05 - Specimen Collection Study to Evaluate Biomarkers in Subjects with Cancer     Patient Diane Sawyer was identified by Dr. Mosetta Putt as a potential candidate for the above listed study.  This Clinical Research Coordinator met with Diane Sawyer, Diane Sawyer on 02/12/24 in a manner and location that ensures patient privacy to discuss participation in the above listed research study.  Patient is Unaccompanied.  Patient was previously provided with informed consent documents.  Patient confirmed they have read the informed consent documents.  As outlined in the informed consent form, this Coordinator and AIREONNA BAUER discussed the purpose of the research study, the investigational nature of the study, study procedures and requirements for study participation, potential risks and benefits of study participation, as well as alternatives to participation.  This study is not blinded or double-blinded. The patient understands participation is voluntary and they may withdraw from study participation at any time.  This study does not involve randomization.  This study does not involve an investigational drug or device. This study does not involve a placebo. Patient understands enrollment is pending full eligibility review.   Confidentiality and how the patient's information will be used as part of study participation were discussed.  Patient was informed there is reimbursement provided for their time and effort spent on trial participation.    All questions were answered to patient's satisfaction.  The informed consent with embedded HIPAA language was reviewed page by page.  The patient's mental and emotional status is appropriate to provide informed consent, and the patient verbalizes an understanding of study participation.  Patient has agreed to participate in the above listed research study and has voluntarily signed the informed consent version 18 Dec 2020 Revised 03 Jan 2022 with embedded HIPAA  language, version    18 Dec 2020 Revised 03 Jan 2022 on 02/12/24 at 12PM.  The patient was provided with a copy of the signed informed consent form with embedded HIPAA language for their reference.  No study specific procedures were obtained prior to the signing of the informed consent document.  Approximately 25 minutes were spent with the patient reviewing the informed consent documents.  Patient was not requested to complete a Release of Information form.   Eligibility: Eligibility criteria reviewed with patient. This coordinator has reviewed this patient's inclusion and exclusion criteria and confirmed patient is eligible for study participation. Eligibility confirmed by treating investigator, who also agrees that patient should proceed with enrollment. Patient will continue with enrollment.  Data Collection: Patient was interviewed to collect the following information.  Medical History:  High Blood Pressure  Yes Coronary Artery Disease No Lupus    No Rheumatoid Arthritis  No Diabetes   No      If yes, which type?       Lynch Syndrome  No  Is the patient currently taking a magnesium supplement?   No   Does the patient have a personal history of cancer (greater than 5 years ago)?  No  Does the patient have a family history of cancer in 1st or 2nd degree relatives? Yes If yes, Relationship(s) and Cancer type(s)? Father, Lung  Does the patient have history of alcohol consumption? No    Does the patient have history of cigarette, cigar, pipe, or chewing tobacco use?  No   Blood Collection: Research blood obtained by [facility] venipuncture. Patient tolerated well without any adverse events.  Gift Card: $50 gift card given to patient for her participation in this study.  Patient was thanked for their participation in this study.    Cooper Render, MPH  Clinical Research Coordinator

## 2024-02-13 ENCOUNTER — Ambulatory Visit (HOSPITAL_BASED_OUTPATIENT_CLINIC_OR_DEPARTMENT_OTHER): Payer: Self-pay | Admitting: Anesthesiology

## 2024-02-13 ENCOUNTER — Ambulatory Visit (HOSPITAL_BASED_OUTPATIENT_CLINIC_OR_DEPARTMENT_OTHER)
Admission: RE | Admit: 2024-02-13 | Discharge: 2024-02-13 | Disposition: A | Discharge status: Home / Self Care | Payer: BLUE CROSS/BLUE SHIELD | Attending: Surgery | Admitting: Surgery

## 2024-02-13 ENCOUNTER — Encounter (HOSPITAL_BASED_OUTPATIENT_CLINIC_OR_DEPARTMENT_OTHER): Payer: Self-pay | Admitting: Surgery

## 2024-02-13 ENCOUNTER — Ambulatory Visit
Admission: RE | Admit: 2024-02-13 | Discharge: 2024-02-13 | Disposition: A | Discharge status: Home / Self Care | Payer: BLUE CROSS/BLUE SHIELD | Source: Ambulatory Visit | Attending: Surgery | Admitting: Surgery

## 2024-02-13 ENCOUNTER — Other Ambulatory Visit: Payer: Self-pay

## 2024-02-13 ENCOUNTER — Encounter (HOSPITAL_BASED_OUTPATIENT_CLINIC_OR_DEPARTMENT_OTHER)
Admission: RE | Disposition: A | Discharge status: Home / Self Care | Payer: Self-pay | Source: Home / Self Care | Attending: Surgery

## 2024-02-13 DIAGNOSIS — Z8249 Family history of ischemic heart disease and other diseases of the circulatory system: Secondary | ICD-10-CM | POA: Diagnosis not present

## 2024-02-13 DIAGNOSIS — N6315 Unspecified lump in the right breast, overlapping quadrants: Secondary | ICD-10-CM | POA: Diagnosis not present

## 2024-02-13 DIAGNOSIS — F419 Anxiety disorder, unspecified: Secondary | ICD-10-CM | POA: Insufficient documentation

## 2024-02-13 DIAGNOSIS — Z1732 Human epidermal growth factor receptor 2 negative status: Secondary | ICD-10-CM | POA: Diagnosis not present

## 2024-02-13 DIAGNOSIS — Z853 Personal history of malignant neoplasm of breast: Secondary | ICD-10-CM

## 2024-02-13 DIAGNOSIS — K219 Gastro-esophageal reflux disease without esophagitis: Secondary | ICD-10-CM | POA: Diagnosis not present

## 2024-02-13 DIAGNOSIS — Z1721 Progesterone receptor positive status: Secondary | ICD-10-CM | POA: Insufficient documentation

## 2024-02-13 DIAGNOSIS — Z79899 Other long term (current) drug therapy: Secondary | ICD-10-CM | POA: Diagnosis not present

## 2024-02-13 DIAGNOSIS — C50911 Malignant neoplasm of unspecified site of right female breast: Secondary | ICD-10-CM | POA: Diagnosis not present

## 2024-02-13 DIAGNOSIS — D0511 Intraductal carcinoma in situ of right breast: Secondary | ICD-10-CM | POA: Diagnosis not present

## 2024-02-13 DIAGNOSIS — I10 Essential (primary) hypertension: Secondary | ICD-10-CM | POA: Insufficient documentation

## 2024-02-13 DIAGNOSIS — C50811 Malignant neoplasm of overlapping sites of right female breast: Secondary | ICD-10-CM | POA: Diagnosis not present

## 2024-02-13 DIAGNOSIS — Z170 Estrogen receptor positive status [ER+]: Secondary | ICD-10-CM | POA: Diagnosis not present

## 2024-02-13 DIAGNOSIS — C50912 Malignant neoplasm of unspecified site of left female breast: Secondary | ICD-10-CM | POA: Diagnosis not present

## 2024-02-13 DIAGNOSIS — Z01818 Encounter for other preprocedural examination: Secondary | ICD-10-CM

## 2024-02-13 HISTORY — PX: BREAST LUMPECTOMY WITH RADIOACTIVE SEED LOCALIZATION: SHX6424

## 2024-02-13 SURGERY — BREAST LUMPECTOMY WITH RADIOACTIVE SEED LOCALIZATION
Anesthesia: General | Site: Breast | Laterality: Right

## 2024-02-13 MED ORDER — PROPOFOL 10 MG/ML IV BOLUS
INTRAVENOUS | Status: AC
  Filled 2024-02-13: qty 20

## 2024-02-13 MED ORDER — TRANEXAMIC ACID 1000 MG/10ML IV SOLN
INTRAVENOUS | Status: AC
  Filled 2024-02-13: qty 30

## 2024-02-13 MED ORDER — ONDANSETRON HCL 4 MG/2ML IJ SOLN
INTRAMUSCULAR | Status: DC | PRN
  Administered 2024-02-13: 4 mg via INTRAVENOUS

## 2024-02-13 MED ORDER — 0.9 % SODIUM CHLORIDE (POUR BTL) OPTIME
TOPICAL | Status: DC | PRN
  Administered 2024-02-13: 500 mL

## 2024-02-13 MED ORDER — EPHEDRINE 5 MG/ML INJ
INTRAVENOUS | Status: AC
  Filled 2024-02-13: qty 5

## 2024-02-13 MED ORDER — LIDOCAINE 2% (20 MG/ML) 5 ML SYRINGE
INTRAMUSCULAR | Status: AC
  Filled 2024-02-13: qty 5

## 2024-02-13 MED ORDER — CEFAZOLIN SODIUM-DEXTROSE 2-4 GM/100ML-% IV SOLN
INTRAVENOUS | Status: AC
  Filled 2024-02-13: qty 100

## 2024-02-13 MED ORDER — ACETAMINOPHEN 500 MG PO TABS
1000.0000 mg | ORAL_TABLET | ORAL | Status: AC
  Administered 2024-02-13: 1000 mg via ORAL

## 2024-02-13 MED ORDER — ACETAMINOPHEN 500 MG PO TABS
ORAL_TABLET | ORAL | Status: AC
  Filled 2024-02-13: qty 2

## 2024-02-13 MED ORDER — LIDOCAINE 2% (20 MG/ML) 5 ML SYRINGE
INTRAMUSCULAR | Status: DC | PRN
  Administered 2024-02-13: 60 mg via INTRAVENOUS

## 2024-02-13 MED ORDER — FENTANYL CITRATE (PF) 100 MCG/2ML IJ SOLN
INTRAMUSCULAR | Status: AC
  Filled 2024-02-13: qty 2

## 2024-02-13 MED ORDER — BUPIVACAINE-EPINEPHRINE 0.5% -1:200000 IJ SOLN
INTRAMUSCULAR | Status: DC | PRN
  Administered 2024-02-13: 14 mL

## 2024-02-13 MED ORDER — TRAMADOL HCL 50 MG PO TABS
50.0000 mg | ORAL_TABLET | Freq: Four times a day (QID) | ORAL | 0 refills | Status: DC | PRN
Start: 2024-02-13 — End: 2024-07-10

## 2024-02-13 MED ORDER — FENTANYL CITRATE (PF) 100 MCG/2ML IJ SOLN
INTRAMUSCULAR | Status: DC | PRN
  Administered 2024-02-13 (×2): 25 ug via INTRAVENOUS
  Administered 2024-02-13: 50 ug via INTRAVENOUS

## 2024-02-13 MED ORDER — CEFAZOLIN SODIUM-DEXTROSE 2-4 GM/100ML-% IV SOLN
2.0000 g | INTRAVENOUS | Status: AC
  Administered 2024-02-13: 2 g via INTRAVENOUS

## 2024-02-13 MED ORDER — PROPOFOL 10 MG/ML IV BOLUS
INTRAVENOUS | Status: DC | PRN
  Administered 2024-02-13: 150 mg via INTRAVENOUS

## 2024-02-13 MED ORDER — ONDANSETRON HCL 4 MG/2ML IJ SOLN
INTRAMUSCULAR | Status: AC
  Filled 2024-02-13: qty 2

## 2024-02-13 MED ORDER — DEXAMETHASONE SODIUM PHOSPHATE 10 MG/ML IJ SOLN
INTRAMUSCULAR | Status: DC | PRN
  Administered 2024-02-13: 5 mg via INTRAVENOUS

## 2024-02-13 MED ORDER — HEPARIN (PORCINE) IN NACL 1000-0.9 UT/500ML-% IV SOLN
INTRAVENOUS | Status: AC
  Filled 2024-02-13: qty 500

## 2024-02-13 MED ORDER — BUPIVACAINE-EPINEPHRINE (PF) 0.5% -1:200000 IJ SOLN
INTRAMUSCULAR | Status: AC
  Filled 2024-02-13: qty 90

## 2024-02-13 MED ORDER — DEXAMETHASONE SODIUM PHOSPHATE 10 MG/ML IJ SOLN
INTRAMUSCULAR | Status: AC
  Filled 2024-02-13: qty 1

## 2024-02-13 MED ORDER — HEPARIN SOD (PORK) LOCK FLUSH 100 UNIT/ML IV SOLN
INTRAVENOUS | Status: AC
  Filled 2024-02-13: qty 5

## 2024-02-13 MED ORDER — ACETAMINOPHEN 500 MG PO TABS: 1000.0000 mg | ORAL_TABLET | Freq: Once | ORAL | Status: DC

## 2024-02-13 MED ORDER — LACTATED RINGERS IV SOLN: INTRAVENOUS | Status: DC

## 2024-02-13 MED ORDER — EPHEDRINE SULFATE-NACL 50-0.9 MG/10ML-% IV SOSY
PREFILLED_SYRINGE | INTRAVENOUS | Status: DC | PRN
  Administered 2024-02-13: 15 mg via INTRAVENOUS

## 2024-02-13 SURGICAL SUPPLY — 40 items
APPLIER CLIP 9.375 MED OPEN (MISCELLANEOUS) ×1 IMPLANT
BINDER BREAST 3XL (GAUZE/BANDAGES/DRESSINGS) IMPLANT
BINDER BREAST LRG (GAUZE/BANDAGES/DRESSINGS) IMPLANT
BINDER BREAST MEDIUM (GAUZE/BANDAGES/DRESSINGS) IMPLANT
BINDER BREAST XLRG (GAUZE/BANDAGES/DRESSINGS) IMPLANT
BINDER BREAST XXLRG (GAUZE/BANDAGES/DRESSINGS) IMPLANT
BLADE SURG 15 STRL LF DISP TIS (BLADE) ×1 IMPLANT
CANISTER SUC SOCK COL 7IN (MISCELLANEOUS) IMPLANT
CANISTER SUCT 1200ML W/VALVE (MISCELLANEOUS) IMPLANT
CHLORAPREP W/TINT 26 (MISCELLANEOUS) ×1 IMPLANT
CLIP APPLIE 9.375 MED OPEN (MISCELLANEOUS) IMPLANT
COVER BACK TABLE 60X90IN (DRAPES) ×1 IMPLANT
COVER MAYO STAND STRL (DRAPES) ×1 IMPLANT
COVER PROBE CYLINDRICAL 5X96 (MISCELLANEOUS) ×1 IMPLANT
DERMABOND ADVANCED .7 DNX12 (GAUZE/BANDAGES/DRESSINGS) ×1 IMPLANT
DRAPE LAPAROSCOPIC ABDOMINAL (DRAPES) ×1 IMPLANT
DRAPE UTILITY XL STRL (DRAPES) ×1 IMPLANT
ELECT REM PT RETURN 9FT ADLT (ELECTROSURGICAL) ×1 IMPLANT
ELECTRODE REM PT RTRN 9FT ADLT (ELECTROSURGICAL) ×1 IMPLANT
GAUZE SPONGE 4X4 12PLY STRL LF (GAUZE/BANDAGES/DRESSINGS) IMPLANT
GLOVE SURG SIGNA 7.5 PF LTX (GLOVE) ×1 IMPLANT
GOWN STRL REUS W/ TWL LRG LVL3 (GOWN DISPOSABLE) ×1 IMPLANT
GOWN STRL REUS W/ TWL XL LVL3 (GOWN DISPOSABLE) ×1 IMPLANT
KIT MARKER MARGIN INK (KITS) ×1 IMPLANT
NDL HYPO 25X1 1.5 SAFETY (NEEDLE) ×1 IMPLANT
NEEDLE HYPO 25X1 1.5 SAFETY (NEEDLE) ×1 IMPLANT
NS IRRIG 1000ML POUR BTL (IV SOLUTION) IMPLANT
PACK BASIN DAY SURGERY FS (CUSTOM PROCEDURE TRAY) ×1 IMPLANT
PENCIL SMOKE EVACUATOR (MISCELLANEOUS) ×1 IMPLANT
SLEEVE SCD COMPRESS KNEE MED (STOCKING) ×1 IMPLANT
SPIKE FLUID TRANSFER (MISCELLANEOUS) IMPLANT
SPONGE T-LAP 4X18 ~~LOC~~+RFID (SPONGE) ×1 IMPLANT
SUT MNCRL AB 4-0 PS2 18 (SUTURE) ×1 IMPLANT
SUT SILK 2 0 SH (SUTURE) IMPLANT
SUT VIC AB 3-0 SH 27X BRD (SUTURE) ×1 IMPLANT
SYR CONTROL 10ML LL (SYRINGE) ×1 IMPLANT
TOWEL GREEN STERILE FF (TOWEL DISPOSABLE) ×1 IMPLANT
TRAY FAXITRON CT DISP (TRAY / TRAY PROCEDURE) ×1 IMPLANT
TUBE CONNECTING 20X1/4 (TUBING) IMPLANT
YANKAUER SUCT BULB TIP NO VENT (SUCTIONS) IMPLANT

## 2024-02-13 NOTE — Interval H&P Note (Signed)
 History and Physical Interval Note:no change in H and P  02/13/2024 7:11 AM  Diane Sawyer  has presented today for surgery, with the diagnosis of RIGHT BREAST CANCER.  The various methods of treatment have been discussed with the patient and family. After consideration of risks, benefits and other options for treatment, the patient has consented to  Procedure(s): RADIOACTIVE SEED GUIDED RIGHT BREAST LUMPECTOMY (Right) as a surgical intervention.  The patient's history has been reviewed, patient examined, no change in status, stable for surgery.  I have reviewed the patient's chart and labs.  Questions were answered to the patient's satisfaction.     [name]

## 2024-02-13 NOTE — Discharge Instructions (Addendum)
 [facility] Office Phone Number [phone]  BREAST BIOPSY/ PARTIAL MASTECTOMY: POST OP INSTRUCTIONS  Always review your discharge instruction sheet given to you by the facility where your surgery was performed.  IF YOU HAVE DISABILITY OR FAMILY LEAVE FORMS, YOU MUST BRING THEM TO THE OFFICE FOR PROCESSING.  DO NOT GIVE THEM TO YOUR DOCTOR.  A prescription for pain medication may be given to you upon discharge.  Take your pain medication as prescribed, if needed.  If narcotic pain medicine is not needed, then you may take acetaminophen (Tylenol) or ibuprofen (Advil) as needed. Take your usually prescribed medications unless otherwise directed If you need a refill on your pain medication, please contact your pharmacy.  They will contact our office to request authorization.  Prescriptions will not be filled after 5pm or on week-ends. You should eat very light the first 24 hours after surgery, such as soup, crackers, pudding, etc.  Resume your normal diet the day after surgery. Most patients will experience some swelling and bruising in the breast.  Ice packs and a good support bra will help.  Swelling and bruising can take several days to resolve.  It is common to experience some constipation if taking pain medication after surgery.  Increasing fluid intake and taking a stool softener will usually help or prevent this problem from occurring.  A mild laxative (Milk of Magnesia or Miralax) should be taken according to package directions if there are no bowel movements after 48 hours. Unless discharge instructions indicate otherwise, you may remove your bandages 24-48 hours after surgery, and you may shower at that time.  You may have steri-strips (small skin tapes) in place directly over the incision.  These strips should be left on the skin for 7-10 days.  If your surgeon used skin glue on the incision, you may shower in 24 hours.  The glue will flake off over the next 2-3 weeks.  Any  sutures or staples will be removed at the office during your follow-up visit. ACTIVITIES:  You may resume regular daily activities (gradually increasing) beginning the next day.  Wearing a good support bra or sports bra minimizes pain and swelling.  You may have sexual intercourse when it is comfortable. You may drive when you no longer are taking prescription pain medication, you can comfortably wear a seatbelt, and you can safely maneuver your car and apply brakes. RETURN TO WORK:  ______________________________________________________________________________________ Diane Sawyer should see your doctor in the office for a follow-up appointment approximately two weeks after your surgery.  Your doctor's nurse will typically make your follow-up appointment when she calls you with your pathology report.  Expect your pathology report 2-3 business days after your surgery.  You may call to check if you do not hear from [place] after three days. OTHER INSTRUCTIONS: YOU MAY REMOVE THE BINDER AND SHOWER STARTING TOMORROW AND THEN PUT THE BINDER BACK ON ICE PACK, TYLENOL, AND IBUPROFEN ALSO FOR PAIN NO VIGOROUS ACTIVITY FOR ONE WEEK _____________________________________________________________________________________________________________________________________ _____________________________________________________________________________________________________________________________________ _____________________________________________________________________________________________________________________________________  WHEN TO CALL YOUR DOCTOR: Fever over 101.0 Nausea and/or vomiting. Extreme swelling or bruising. Continued bleeding from incision. Increased pain, redness, or drainage from the incision.  The clinic staff is available to answer your questions during regular business hours.  Please don't hesitate to call and ask to speak to one of the nurses for clinical concerns.  If you have a medical emergency,  go to the nearest emergency room or call 911.  A surgeon from [facility] is always on  call at the hospital.  For further questions, please visit [email or web address]   No tylenol until 12:45   Post Anesthesia Home Care Instructions  Activity: Get plenty of rest for the remainder of the day. A responsible individual must stay with you for 24 hours following the procedure.  For the next 24 hours, DO NOT: -Drive a car -Advertising copywriter -Drink alcoholic beverages -Take any medication unless instructed by your physician -Make any legal decisions or sign important papers.  Meals: Start with liquid foods such as gelatin or soup. Progress to regular foods as tolerated. Avoid greasy, spicy, heavy foods. If nausea and/or vomiting occur, drink only clear liquids until the nausea and/or vomiting subsides. Call your physician if vomiting continues.  Special Instructions/Symptoms: Your throat may feel dry or sore from the anesthesia or the breathing tube placed in your throat during surgery. If this causes discomfort, gargle with warm salt water. The discomfort should disappear within 24 hours.  If you had a scopolamine patch placed behind your ear for the management of post- operative nausea and/or vomiting:  1. The medication in the patch is effective for 72 hours, after which it should be removed.  Wrap patch in a tissue and discard in the trash. Wash hands thoroughly with soap and water. 2. You may remove the patch earlier than 72 hours if you experience unpleasant side effects which may include dry mouth, dizziness or visual disturbances. 3. Avoid touching the patch. Wash your hands with soap and water after contact with the patch.

## 2024-02-13 NOTE — Transfer of Care (Signed)
 Immediate Anesthesia Transfer of Care Note  Patient: Diane Sawyer  Procedure(s) Performed: Procedure(s) (LRB): RADIOACTIVE SEED GUIDED RIGHT BREAST LUMPECTOMY (Right)  Patient Location: PACU  Anesthesia Type: General  Level of Consciousness: awake, oriented, sedated and patient cooperative  Airway & Oxygen Therapy: Patient Spontanous Breathing room air  Post-op Assessment: Report given to PACU RN and Post -op Vital signs reviewed and stable  Post vital signs: Reviewed and stable  Complications: No apparent anesthesia complications Last Vitals:  Vitals Value Taken Time  BP 113/66 02/13/24 0817  Temp    Pulse 87 02/13/24 0818  Resp 16 02/13/24 0818  SpO2 96 % 02/13/24 0818  Vitals shown include unfiled device data.  Last Pain:  Vitals:   02/13/24 0641  TempSrc: Oral  PainSc:       Patients Stated Pain Goal: 6 (02/13/24 8119)  Complications: No notable events documented.

## 2024-02-13 NOTE — Op Note (Signed)
 RADIOACTIVE SEED GUIDED RIGHT BREAST LUMPECTOMY  Procedure Note  Diane Sawyer 02/13/2024   Pre-op Diagnosis: RIGHT BREAST CANCER     Post-op Diagnosis: same  Procedure(s): RADIOACTIVE SEED GUIDED RIGHT BREAST LUMPECTOMY  Surgeon(s): [name], MD  Anesthesia: General  Staff:  Circulator: Griffin Basil, RN Scrub Person: Elsie Saas, RN  Estimated Blood Loss: Minimal               Specimens: sent to path  Indications: This is a 78 year old female who had undergone screening mammography which demonstrated a small mass measuring 8 mm on ultrasound at the 12 o'clock position of the right breast.  Biopsy showed invasive ductal carcinoma.  The decision was made to proceed with a radioactive seed guided right breast lumpectomy  Procedure: The patient was brought to the operating room and identified the correct patient.  She was placed upon on the operating room table and general anesthesia was induced.  Her right breast was prepped and draped in the usual sterile fashion.  I located the radioactive seed near the 12 o'clock position of the right breast several centimeters from the nipple areolar complex with the neoprobe.  I anesthetized skin with Marcaine and the performed elliptical incision in the upper outer quadrant with a scalpel.  I then dissected down circumferentially into the breast tissue with the cautery.  With the aid of the neoprobe I stayed widely around the signal from the radioactive seed going down toward the chest wall and then coming underneath the signal using the neoprobe and the cautery.  I then completed the lumpectomy circumferentially removing the breast tissue.  I next marked all margins on the specimen with paint.  An x-ray was performed confirming the radioactive seed and biopsy clip in the center of the specimen.  The lumpectomy specimen was then sent to pathology for evaluation.  Hemostasis was achieved with the cautery.  I injected  the lumpectomy cavity further with Marcaine.  I placed surgical clips around the periphery of the cavity for marking purposes.  I then closed the subcutaneous tissue with interrupted 3-0 Vicryl sutures and closed the skin with a running 4-0 Monocryl.  Dermabond was then applied.  The patient tolerated the procedure well.  All the counts were correct at the end of the procedure.  The patient was then extubated in the operating room and taken in a stable condition to the recovery room.          [name]   Date: 02/13/2024  Time: 8:10 AM

## 2024-02-13 NOTE — Anesthesia Postprocedure Evaluation (Signed)
 Anesthesia Post Note  Patient: Diane Sawyer  Procedure(s) Performed: RADIOACTIVE SEED GUIDED RIGHT BREAST LUMPECTOMY (Right: Breast)     Patient location during evaluation: PACU Anesthesia Type: General Level of consciousness: awake and alert, oriented and patient cooperative Pain management: pain level controlled Vital Signs Assessment: post-procedure vital signs reviewed and stable Respiratory status: spontaneous breathing, nonlabored ventilation and respiratory function stable Cardiovascular status: blood pressure returned to baseline and stable Postop Assessment: no apparent nausea or vomiting Anesthetic complications: no   No notable events documented.  Last Vitals:  Vitals:   02/13/24 0830 02/13/24 0850  BP: 124/72 136/74  Pulse: 77 66  Resp: (!) 9 18  Temp:    SpO2: 96% 99%    Last Pain:  Vitals:   02/13/24 0818  TempSrc:   PainSc: 2                  Lannie Fields

## 2024-02-13 NOTE — Anesthesia Procedure Notes (Signed)
 Procedure Name: LMA Insertion Date/Time: 02/13/2024 7:40 AM  Performed by: Francie Massing, CRNAPre-anesthesia Checklist: Patient identified, Emergency Drugs available, Suction available and Patient being monitored Patient Re-evaluated:Patient Re-evaluated prior to induction Oxygen Delivery Method: Circle system utilized Preoxygenation: Pre-oxygenation with 100% oxygen Induction Type: IV induction Ventilation: Mask ventilation without difficulty LMA: LMA inserted LMA Size: 4.0 Number of attempts: 1 Airway Equipment and Method: Bite block Placement Confirmation: positive ETCO2 Tube secured with: Tape Dental Injury: Teeth and Oropharynx as per pre-operative assessment

## 2024-02-14 ENCOUNTER — Encounter (HOSPITAL_BASED_OUTPATIENT_CLINIC_OR_DEPARTMENT_OTHER): Payer: Self-pay | Admitting: Surgery

## 2024-02-15 ENCOUNTER — Encounter: Payer: Self-pay | Admitting: General Practice

## 2024-02-15 LAB — SURGICAL PATHOLOGY

## 2024-02-15 NOTE — Progress Notes (Signed)
 [facility] Spiritual Care Note  Ms Schwenn was very appreciative of [facility] follow-up call. She reports great support from family, friends, and church community and was very upbeat about her surgery and how she is feeling now. No other concerns at this time, but she knows to contact Spiritual Care if needs arise or circumstances change.   [address] [place], [place], [facility] Pager [phone] Voicemail [phone]

## 2024-02-19 ENCOUNTER — Encounter: Payer: Self-pay | Admitting: Genetic Counselor

## 2024-02-19 ENCOUNTER — Telehealth: Payer: Self-pay | Admitting: Genetic Counselor

## 2024-02-19 ENCOUNTER — Ambulatory Visit: Payer: Self-pay | Admitting: Genetic Counselor

## 2024-02-19 ENCOUNTER — Encounter: Payer: Self-pay | Admitting: *Deleted

## 2024-02-19 DIAGNOSIS — Z1379 Encounter for other screening for genetic and chromosomal anomalies: Secondary | ICD-10-CM | POA: Insufficient documentation

## 2024-02-19 NOTE — Telephone Encounter (Signed)
Revealed negative genetic testing.  Discussed that we do not know why she has breast cancer or why there is cancer in the family. It could be due to a different gene that we are not testing, or maybe our current technology may not be able to pick something up.  It will be important for her to keep in contact with genetics to keep up with whether additional testing may be needed. 

## 2024-02-19 NOTE — Progress Notes (Signed)
 HPI:  [name] was previously seen in the [facility]etics clinic due to a personal history of cancer and colon polyps and concerns regarding a hereditary predisposition to cancer. Please refer to our prior cancer genetics clinic note for more information regarding our discussion, assessment and recommendations, at the time. [name] recent genetic test results were disclosed to her, as were recommendations warranted by these results. These results and recommendations are discussed in more detail below.  CANCER HISTORY:  Oncology History  Malignant neoplasm of upper-outer quadrant of right breast in female, estrogen receptor positive (HCC)  01/22/2024 Cancer Staging   Staging form: Breast, AJCC 8th Edition - Clinical stage from 01/22/2024: Stage IA (cT1b, cN0, cM0, G1, ER+, PR+, HER2-) - Signed by Malachy Mood, MD on 01/30/2024 Stage prefix: Initial diagnosis Histologic grading system: 3 grade system   01/29/2024 Initial Diagnosis   Malignant neoplasm of upper-outer quadrant of right breast in female, estrogen receptor positive (HCC)   02/16/2024 Genetic Testing   Negative genetic testing on the CancerNext-Expanded+RNAinsight panel.  POLE VUS identified.  The report date is February 16, 2024.  The CancerNext-Expanded gene panel offered by [facility] and includes sequencing, rearrangement, and RNA analysis for the following 76 genes: AIP, ALK, APC, ATM, AXIN2, BAP1, BARD1, BMPR1A, BRCA1, BRCA2, BRIP1, CDC73, CDH1, CDK4, CDKN1B, CDKN2A, CEBPA, CHEK2, CTNNA1, DDX41, DICER1, ETV6, FH, FLCN, GATA2, LZTR1, MAX, MBD4, MEN1, MET, MLH1, MSH2, MSH3, MSH6, MUTYH, NF1, NF2, NTHL1, PALB2, PHOX2B, PMS2, POT1, PRKAR1A, PTCH1, PTEN, RAD51C, RAD51D, RB1, RET, RUNX1, SDHA, SDHAF2, SDHB, SDHC, SDHD, SMAD4, SMARCA4, SMARCB1, SMARCE1, STK11, SUFU, TMEM127, TP53, TSC1, TSC2, VHL, and WT1 (sequencing and deletion/duplication); EGFR, HOXB13, KIT, MITF, PDGFRA, POLD1, and POLE (sequencing only); EPCAM and GREM1  (deletion/duplication only).       FAMILY HISTORY:  We obtained a detailed, 4-generation family history.  Significant diagnoses are listed below: Family History  Problem Relation Age of Onset   Stroke Mother 31   Hypertension Mother    Sudden death Mother    Anxiety disorder Mother    Lung cancer Father 46       smoker   Brain cancer Paternal Uncle        benign   Colon cancer Neg Hx    Esophageal cancer Neg Hx    Stomach cancer Neg Hx    Rectal cancer Neg Hx        The patient has a son and daughter who are cancer free.  She reports that her father had lung cancer and was a smoker.  He died at 84.  She also reports that a paternal uncle had a benign brain tumor.  There was no other reported family history of cancer.     [name] is unaware of previous family history of genetic testing for hereditary cancer risks. Patient's maternal ancestors are of [place] descent, and paternal ancestors are of [place] and [place] descent. There is no reported Ashkenazi Jewish ancestry. There is no known consanguinity  GENETIC TEST RESULTS: Genetic testing reported out on February 16, 2024 through the CancerNext-Expanded+RNAinsight cancer panel found no pathogenic mutations. The CancerNext-Expanded gene panel offered by [facility] and includes sequencing, rearrangement, and RNA analysis for the following 76 genes: AIP, ALK, APC, ATM, AXIN2, BAP1, BARD1, BMPR1A, BRCA1, BRCA2, BRIP1, CDC73, CDH1, CDK4, CDKN1B, CDKN2A, CEBPA, CHEK2, CTNNA1, DDX41, DICER1, ETV6, FH, FLCN, GATA2, LZTR1, MAX, MBD4, MEN1, MET, MLH1, MSH2, MSH3, MSH6, MUTYH, NF1, NF2, NTHL1, PALB2, PHOX2B, PMS2, POT1, PRKAR1A, PTCH1, PTEN, RAD51C, RAD51D,  RB1, RET, RUNX1, SDHA, SDHAF2, SDHB, SDHC, SDHD, SMAD4, SMARCA4, SMARCB1, SMARCE1, STK11, SUFU, TMEM127, TP53, TSC1, TSC2, VHL, and WT1 (sequencing and deletion/duplication); EGFR, HOXB13, KIT, MITF, PDGFRA, POLD1, and POLE (sequencing only); EPCAM and GREM1 (deletion/duplication only). The  test report has been scanned into EPIC and is located under the Molecular Pathology section of the Results Review tab.  A portion of the result report is included below for reference.     We discussed with [name] that because current genetic testing is not perfect, it is possible there may be a gene mutation in one of these genes that current testing cannot detect, but that chance is small.  We also discussed, that there could be another gene that has not yet been discovered, or that we have not yet tested, that is responsible for the cancer diagnoses in the family. It is also possible there is a hereditary cause for the cancer in the family that [name] did not inherit and therefore was not identified in her testing.  Therefore, it is important to remain in touch with cancer genetics in the future so that we can continue to offer [name] the most up to date genetic testing.   Genetic testing did identify a variant of uncertain significance (VUS) was identified in the POLE gene called p.[id number] (c.[id number]).  At this time, it is unknown if this variant is associated with increased cancer risk or if this is a normal finding, but most variants such as this get reclassified to being inconsequential. It should not be used to make medical management decisions. With time, we suspect the lab will determine the significance of this variant, if any. If we do learn more about it, we will try to contact [name] to discuss it further. However, it is important to stay in touch with [place] periodically and keep the address and phone number up to date.  ADDITIONAL GENETIC TESTING: We discussed with [name] that her genetic testing was fairly extensive.  If there are genes identified to increase cancer risk that can be analyzed in the future, we would be happy to discuss and coordinate this testing at that time.    CANCER SCREENING RECOMMENDATIONS: [name] test result is considered negative (normal).  This means that  we have not identified a hereditary cause for her personal history of breast cancer and colon polyps at this time. Most cancers happen by chance and this negative test suggests that her personal history of breast cancer and colon polyps may fall into this category.    Possible reasons for [name]'s negative genetic test include:  1. There may be a gene mutation in one of these genes that current testing methods cannot detect but that chance is small.  2. There could be another gene that has not yet been discovered, or that we have not yet tested, that is responsible for the cancer diagnoses in the family.  3.  There may be no hereditary risk for cancer in the family. The cancers in [name] and/or her family may be sporadic/familial or due to other genetic and environmental factors. 4. It is also possible there is a hereditary cause for the cancer in the family that [name] did not inherit.  Therefore, it is recommended she continue to follow the cancer management and screening guidelines provided by her oncology and primary healthcare provider. An individual's cancer risk and medical management are not determined by genetic test results alone. Overall cancer risk assessment incorporates  additional factors, including personal medical history, family history, and any available genetic information that may result in a personalized plan for cancer prevention and surveillance  RECOMMENDATIONS FOR FAMILY MEMBERS:   Since she did not inherit a identifiable mutation in a cancer predisposition gene included on this panel, her children could not have inherited a known mutation from her in one of these genes. Individuals in this family might be at some increased risk of developing cancer, over the general population risk, simply due to the family history of cancer.  We recommended women in this family have a yearly mammogram beginning at age 63, or 70 years younger than the earliest onset of cancer, an annual  clinical breast exam, and perform monthly breast self-exams. Women in this family should also have a gynecological exam as recommended by their primary provider. All family members should be referred for colonoscopy starting at age 37, or 28 years younger than the earliest onset of cancer.  FOLLOW-UP: Lastly, we discussed with [name] that cancer genetics is a rapidly advancing field and it is possible that new genetic tests will be appropriate for her and/or her family members in the future. We encouraged her to remain in contact with cancer genetics on an annual basis so we can update her personal and family histories and let her know of advances in cancer genetics that may benefit this family.   Our contact number was provided. [name] questions were answered to her satisfaction, and she knows she is welcome to call us at anytime with additional questions or concerns.   Maylon Cos, MS, [facility] Licensed, Certified Genetic Counselor [email or web address]

## 2024-02-27 ENCOUNTER — Ambulatory Visit: Payer: BLUE CROSS/BLUE SHIELD | Admitting: Hematology

## 2024-02-27 ENCOUNTER — Inpatient Hospital Stay (HOSPITAL_BASED_OUTPATIENT_CLINIC_OR_DEPARTMENT_OTHER): Payer: BLUE CROSS/BLUE SHIELD | Admitting: Hematology

## 2024-02-27 ENCOUNTER — Encounter: Payer: Self-pay | Admitting: *Deleted

## 2024-02-27 VITALS — BP 123/62 | HR 75 | Temp 98.2°F | Resp 16 | Ht 63.0 in | Wt 197.7 lb

## 2024-02-27 DIAGNOSIS — Z860101 Personal history of adenomatous and serrated colon polyps: Secondary | ICD-10-CM | POA: Diagnosis not present

## 2024-02-27 DIAGNOSIS — Z888 Allergy status to other drugs, medicaments and biological substances status: Secondary | ICD-10-CM | POA: Diagnosis not present

## 2024-02-27 DIAGNOSIS — N8110 Cystocele, unspecified: Secondary | ICD-10-CM | POA: Diagnosis not present

## 2024-02-27 DIAGNOSIS — C50411 Malignant neoplasm of upper-outer quadrant of right female breast: Secondary | ICD-10-CM | POA: Diagnosis not present

## 2024-02-27 DIAGNOSIS — Z881 Allergy status to other antibiotic agents status: Secondary | ICD-10-CM | POA: Diagnosis not present

## 2024-02-27 DIAGNOSIS — Z79899 Other long term (current) drug therapy: Secondary | ICD-10-CM | POA: Diagnosis not present

## 2024-02-27 DIAGNOSIS — Z170 Estrogen receptor positive status [ER+]: Secondary | ICD-10-CM

## 2024-02-27 DIAGNOSIS — F419 Anxiety disorder, unspecified: Secondary | ICD-10-CM | POA: Diagnosis not present

## 2024-02-27 DIAGNOSIS — M8580 Other specified disorders of bone density and structure, unspecified site: Secondary | ICD-10-CM | POA: Diagnosis not present

## 2024-02-27 DIAGNOSIS — I10 Essential (primary) hypertension: Secondary | ICD-10-CM | POA: Diagnosis not present

## 2024-02-27 DIAGNOSIS — R232 Flushing: Secondary | ICD-10-CM | POA: Diagnosis not present

## 2024-02-27 DIAGNOSIS — M1990 Unspecified osteoarthritis, unspecified site: Secondary | ICD-10-CM | POA: Diagnosis not present

## 2024-02-27 DIAGNOSIS — Z1721 Progesterone receptor positive status: Secondary | ICD-10-CM | POA: Diagnosis not present

## 2024-02-27 DIAGNOSIS — Z1732 Human epidermal growth factor receptor 2 negative status: Secondary | ICD-10-CM | POA: Diagnosis not present

## 2024-02-27 DIAGNOSIS — Z79810 Long term (current) use of selective estrogen receptor modulators (SERMs): Secondary | ICD-10-CM | POA: Diagnosis not present

## 2024-02-27 MED ORDER — TAMOXIFEN CITRATE 20 MG PO TABS: 20.0000 mg | ORAL_TABLET | Freq: Every day | ORAL | 3 refills | Status: DC

## 2024-02-27 NOTE — Assessment & Plan Note (Signed)
 pT1cN0M0, stage IA, ER+/PR+/HER2-, G1 -Discovered on screening mammogram -Status post lumpectomy, which showed a 1.2 cm invasive ductal carcinoma, with clear margins.  ER 100%, PR 100% positive, HER2 negative. -Given her advanced age and favorable features, adjuvant radiation was not recommended. -I recommend adjuvant antiestrogen tamoxifen for 5 years.

## 2024-02-27 NOTE — Progress Notes (Signed)
 [facility]   [phone] Fax:([phone]   Clinic Follow up Note   Patient Care Team: Plotnikov, Georgina Quint, MD as PCP - General Russella Dar Venita Lick, MD (Inactive) as Attending Physician (Gastroenterology) Sallye Lat, MD as Consulting Physician (Ophthalmology) Romualdo Bolk, MD (Inactive) as Consulting Physician (Obstetrics and Gynecology) Pershing Proud, RN as Oncology Nurse Navigator Donnelly Angelica, RN as Oncology Nurse Navigator [name], MD as Consulting Physician (General Surgery) Malachy Mood, MD as Consulting Physician (Hematology) Antony Blackbird, MD as Consulting Physician (Radiation Oncology)  Date of Service:  02/27/2024  CHIEF COMPLAINT: f/u of right breast cancer  CURRENT THERAPY:  Pending adjuvant tamoxifen  Oncology History   Malignant neoplasm of upper-outer quadrant of right breast in female, estrogen receptor positive (HCC) pT1cN0M0, stage IA, ER+/PR+/HER2-, G1 -Discovered on screening mammogram -Status post lumpectomy, which showed a 1.2 cm invasive ductal carcinoma, with clear margins.  ER 100%, PR 100% positive, HER2 negative. -Given her advanced age and favorable features, adjuvant radiation was not recommended. -I recommend adjuvant antiestrogen tamoxifen for 5 years.   Assessment and Plan    Breast Cancer Post-surgical follow-up for breast cancer with clear margins. Radiation therapy is under consideration, contingent on pathology report and anti-estrogen therapy initiation. Tamoxifen therapy is planned, with potential side effects including hot flashes and mood changes. She has a small risk of endometrial cancer (<0.5%) and was advised to report vaginal bleeding. Anastrozole, an alternative, lacks risks of blood clots and endometrial cancer but may cause joint pain and reduce bone density. She prefers tamoxifen due to osteopenia and back pain. Non-recurrence probability exceeds 95% with both radiation and  medication, and 90% with medication alone. - Initiate tamoxifen therapy in a couple of weeks post-confirmation from surgeon and radiation oncologist. - Prescribe tamoxifen through [facility] pharmacy. - Advise vaginal estrogen cream twice weekly for prolapse management due to tamoxifen's systemic estrogen blockade. - Schedule follow-up in three months with nurse practitioner and in six months with the doctor. - Instruct to report severe side effects such as intense hot flashes, mood swings, or unusual symptoms.  Osteopenia Osteopenia influences anti-estrogen therapy choice. Tamoxifen is preferred for its potential to improve bone density, unlike anastrozole, which may decrease it. - Monitor bone density as part of ongoing management.  Arthritis Arthritis with back pain from old fractures, managed with occasional acetaminophen. She avoids medications that exacerbate joint pain. - Continue acetaminophen as needed for arthritis pain.  Vaginal Prolapse Vaginal prolapse managed with vaginal estrogen cream. She prefers non-surgical management. - Continue vaginal estrogen cream twice weekly for prolapse symptoms.     Plan -She is recovering well from surgery -I called in tamoxifen today, she will start in about 2 weeks -I sent a message to Dr. Roselind Messier to see if he feels OK to forgo adjuvant radiation -Survivorship in 3 months with NP, follow-up with me in 6 months    SUMMARY OF ONCOLOGIC HISTORY: Oncology History  Malignant neoplasm of upper-outer quadrant of right breast in female, estrogen receptor positive (HCC)  01/22/2024 Cancer Staging   Staging form: Breast, AJCC 8th Edition - Clinical stage from 01/22/2024: Stage IA (cT1b, cN0, cM0, G1, ER+, PR+, HER2-) - Signed by Malachy Mood, MD on 01/30/2024 Stage prefix: Initial diagnosis Histologic grading system: 3 grade system   01/29/2024 Initial Diagnosis   Malignant neoplasm of upper-outer quadrant of right breast in female, estrogen receptor  positive (HCC)   02/13/2024 Cancer Staging   Staging form: Breast, AJCC 8th Edition -  Pathologic stage from 02/13/2024: Stage IA (pT1c, pN0, cM0, G1, ER+, PR+, HER2-) - Signed by Malachy Mood, MD on 02/27/2024 Stage prefix: Initial diagnosis Histologic grading system: 3 grade system Residual tumor (R): R0   02/16/2024 Genetic Testing   Negative genetic testing on the CancerNext-Expanded+RNAinsight panel.  POLE VUS identified.  The report date is February 16, 2024.  The CancerNext-Expanded gene panel offered by [facility] and includes sequencing, rearrangement, and RNA analysis for the following 76 genes: AIP, ALK, APC, ATM, AXIN2, BAP1, BARD1, BMPR1A, BRCA1, BRCA2, BRIP1, CDC73, CDH1, CDK4, CDKN1B, CDKN2A, CEBPA, CHEK2, CTNNA1, DDX41, DICER1, ETV6, FH, FLCN, GATA2, LZTR1, MAX, MBD4, MEN1, MET, MLH1, MSH2, MSH3, MSH6, MUTYH, NF1, NF2, NTHL1, PALB2, PHOX2B, PMS2, POT1, PRKAR1A, PTCH1, PTEN, RAD51C, RAD51D, RB1, RET, RUNX1, SDHA, SDHAF2, SDHB, SDHC, SDHD, SMAD4, SMARCA4, SMARCB1, SMARCE1, STK11, SUFU, TMEM127, TP53, TSC1, TSC2, VHL, and WT1 (sequencing and deletion/duplication); EGFR, HOXB13, KIT, MITF, PDGFRA, POLD1, and POLE (sequencing only); EPCAM and GREM1 (deletion/duplication only).        Discussed the use of AI scribe software for clinical note transcription with the patient, who gave verbal consent to proceed.  History of Present Illness   Diane Sawyer, a 78 year old female with a history of breast cancer, presents for a follow-up visit after surgery. She reports that the surgery went well and she has not experienced any significant pain, only mild tenderness at the surgical site. 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  She has previously seen a radiation oncologist, Dr. Meta Hatchet, who discussed the benefits of radiation therapy and medication. However, she has not spoken to him since her surgery. She recalls that the surgeon, Dr.  Magnus Ivan, informed her that the surgical margins were clear and that the decision regarding radiation therapy would be made in consultation with oncology and radiation specialists.  She also has a history of menopause, which she reports was uneventful with no significant hot flashes. She has been using a vaginal cream twice a week for prolapse, which she plans to continue. She also reports a history of back pain due to old fractures and arthritis, which she manages with occasional Tylenol.         All other systems were reviewed with the patient and are negative.  MEDICAL HISTORY:  Past Medical History:  Diagnosis Date   Adenomatous polyp of colon 2004   Allergic rhinitis    Anemia    Anxiety    Back pain    Bladder prolapse, female, acquired    Breast cancer (HCC)    Cataract 2018   Diverticulosis    GERD (gastroesophageal reflux disease)    Glaucoma    Hypertension    Lumbar compression fracture (HCC) 09/07/2016   x 2    Menopause    Urinary incontinence    Vitamin D deficiency     SURGICAL HISTORY: Past Surgical History:  Procedure Laterality Date   BREAST BIOPSY Right 01/22/2024   [place] RT BREAST BX W LOC DEV 1ST LESION IMG BX SPEC US GUIDE 01/22/2024 [facility]MO[facility]   BREAST BIOPSY  02/12/2024   MM RT RADIOACTIVE SEED LOC MAMMO GUIDE 02/12/2024 [facility] M[facility]MO[facility]   BREAST LUMPECTOMY WITH RADIOACTIVE SEED LOCALIZATION Right 02/13/2024   Procedure: RADIOACTIVE SEED GUIDED RIGHT BREAST LUMPECTOMY;  Surgeon: [name], MD;  Location: [facility];  Service: General;  Laterality: Right;   CATARACT EXTRACTION, BILATERAL  04/06/2017   with cypass stent implanted  COLONOSCOPY     cypess stent left eye Left 2018   POLYPECTOMY      I have reviewed the social history and family history with the patient and they are unchanged from previous note.  ALLERGIES:  is allergic to benazepril hcl, levofloxacin, and mobic [meloxicam].  MEDICATIONS:  Current  Outpatient Medications  Medication Sig Dispense Refill   tamoxifen (NOLVADEX) 20 MG tablet Take 1 tablet (20 mg total) by mouth daily. 30 tablet 3   acetaminophen (TYLENOL) 500 MG tablet Take 1,000 mg by mouth every 6 (six) hours as needed for mild pain.     ALPRAZolam (XANAX) 0.25 MG tablet TAKE 1 TABLET(0.25 MG) BY MOUTH TWICE DAILY AS NEEDED FOR ANXIETY 180 tablet 1   amLODipine (NORVASC) 2.5 MG tablet Take 1 tablet (2.5 mg total) by mouth daily. 90 tablet 3   dorzolamide-timolol (COSOPT) 22.3-6.8 MG/ML ophthalmic solution 1 drop 2 (two) times daily.     estradiol (ESTRACE) 0.1 MG/GM vaginal cream Place 1 g vaginally 2 (two) times a week. 42.5 g 2   fexofenadine (ALLEGRA) 180 MG tablet Take 180 mg by mouth daily.     ipratropium (ATROVENT) 0.06 % nasal spray Place 2 sprays into the nose 3 (three) times daily. Annual appt due must see provider for future refills 45 mL 3   irbesartan (AVAPRO) 150 MG tablet TAKE 1 TABLET(150 MG) BY MOUTH DAILY 90 tablet 3   IRON PO Take 65 mg by mouth daily.     latanoprost (XALATAN) 0.005 % ophthalmic solution INT 1 GTT IN OU HS     pantoprazole (PROTONIX) 40 MG tablet TAKE 1 TABLET(40 MG) BY MOUTH TWICE DAILY 180 tablet 3   traMADol (ULTRAM) 50 MG tablet Take 1 tablet (50 mg total) by mouth every 6 (six) hours as needed for moderate pain (pain score 4-6) or severe pain (pain score 7-10). 20 tablet 0   VITAMIN D PO Take 1,000 Units by mouth daily.     No current facility-administered medications for this visit.    PHYSICAL EXAMINATION: ECOG PERFORMANCE STATUS: 0 - Asymptomatic  Vitals:   02/27/24 1417  BP: 123/62  Pulse: 75  Resp: 16  Temp: 98.2 F (36.8 C)  SpO2: 99%   Wt Readings from Last 3 Encounters:  02/27/24 197 lb 11.2 oz (89.7 kg)  02/13/24 196 lb 6.9 oz (89.1 kg)  01/31/24 193 lb 6.4 oz (87.7 kg)     GENERAL:alert, no distress and comfortable SKIN: skin color, texture, turgor are normal, no rashes or significant lesions EYES:  normal, Conjunctiva are pink and non-injected, sclera clear NECK: supple, thyroid normal size, non-tender, without nodularity LYMPH:  no palpable lymphadenopathy in the cervical, axillary  LUNGS: clear to auscultation and percussion with normal breathing effort HEART: regular rate & rhythm and no murmurs and no lower extremity edema ABDOMEN:abdomen soft, non-tender and normal bowel sounds Musculoskeletal:no cyanosis of digits and no clubbing  NEURO: alert & oriented x 3 with fluent speech, no focal motor/sensory deficits Breasts: Breast inspection showed them to be symmetrical with no nipple discharge.  The surgical incision in right breast is healing well with moderate tenderness.  Palpation of the breasts and axilla revealed no obvious mass that I could appreciate.      LABORATORY DATA:  I have reviewed the data as listed    Latest Ref Rng & Units 01/31/2024   12:24 PM 03/13/2023   10:22 AM 08/30/2021    8:32 AM  CBC  WBC 4.0 - 10.5  K/uL 7.4  6.5  5.3   Hemoglobin 12.0 - 15.0 g/dL 78.2  95.6  21.3   Hematocrit 36.0 - 46.0 % 43.6  42.5  40.6   Platelets 150 - 400 K/uL 211  223.0  198.0         Latest Ref Rng & Units 01/31/2024   12:24 PM 03/13/2023   10:22 AM 02/28/2022    9:58 AM  CMP  Glucose 70 - 99 mg/dL 086  88  91   BUN 8 - 23 mg/dL 18  14  16    Creatinine 0.44 - 1.00 mg/dL 5.78  4.69  6.29   Sodium 135 - 145 mmol/L 141  141  139   Potassium 3.5 - 5.1 mmol/L 4.0  4.1  4.4   Chloride 98 - 111 mmol/L 106  106  105   CO2 22 - 32 mmol/L 28  27  30    Calcium 8.9 - 10.3 mg/dL 9.0  9.0  9.0   Total Protein 6.5 - 8.1 g/dL 6.7  6.8  6.9   Total Bilirubin 0.0 - 1.2 mg/dL 0.4  0.5  0.5   Alkaline Phos 38 - 126 U/L 99  53  48   AST 15 - 41 U/L 13  16  16    ALT 0 - 44 U/L 7  10  9        RADIOGRAPHIC STUDIES: I have personally reviewed the radiological images as listed and agreed with the findings in the report. No results found.    No orders of the defined types were placed  in this encounter.  All questions were answered. The patient knows to call the clinic with any problems, questions or concerns. No barriers to learning was detected. The total time spent in the appointment was 40 minutes.     Malachy Mood, MD 02/27/2024

## 2024-03-01 ENCOUNTER — Telehealth: Payer: Self-pay | Admitting: Hematology

## 2024-03-01 NOTE — Telephone Encounter (Signed)
 Scheduled appointment per 3/25 los. Talked with the patient and she is aware of the made appointment.

## 2024-03-06 ENCOUNTER — Ambulatory Visit (INDEPENDENT_AMBULATORY_CARE_PROVIDER_SITE_OTHER): Admitting: Internal Medicine

## 2024-03-06 ENCOUNTER — Encounter: Payer: Self-pay | Admitting: Internal Medicine

## 2024-03-06 VITALS — BP 112/84 | HR 74 | Temp 97.8°F | Ht 63.0 in | Wt 194.4 lb

## 2024-03-06 DIAGNOSIS — E785 Hyperlipidemia, unspecified: Secondary | ICD-10-CM | POA: Diagnosis not present

## 2024-03-06 DIAGNOSIS — R7303 Prediabetes: Secondary | ICD-10-CM | POA: Diagnosis not present

## 2024-03-06 DIAGNOSIS — Z170 Estrogen receptor positive status [ER+]: Secondary | ICD-10-CM | POA: Diagnosis not present

## 2024-03-06 DIAGNOSIS — E559 Vitamin D deficiency, unspecified: Secondary | ICD-10-CM

## 2024-03-06 DIAGNOSIS — D509 Iron deficiency anemia, unspecified: Secondary | ICD-10-CM | POA: Diagnosis not present

## 2024-03-06 DIAGNOSIS — C50411 Malignant neoplasm of upper-outer quadrant of right female breast: Secondary | ICD-10-CM | POA: Diagnosis not present

## 2024-03-06 DIAGNOSIS — R739 Hyperglycemia, unspecified: Secondary | ICD-10-CM

## 2024-03-06 DIAGNOSIS — I10 Essential (primary) hypertension: Secondary | ICD-10-CM | POA: Diagnosis not present

## 2024-03-06 LAB — COMPREHENSIVE METABOLIC PANEL WITH GFR
ALT: 7 U/L (ref 0–35)
AST: 13 U/L (ref 0–37)
Albumin: 4.2 g/dL (ref 3.5–5.2)
Alkaline Phosphatase: 89 U/L (ref 39–117)
BUN: 19 mg/dL (ref 6–23)
CO2: 29 meq/L (ref 19–32)
Calcium: 9.1 mg/dL (ref 8.4–10.5)
Chloride: 105 meq/L (ref 96–112)
Creatinine, Ser: 0.95 mg/dL (ref 0.40–1.20)
GFR: 58.13 mL/min — ABNORMAL LOW (ref >60.00)
Glucose, Bld: 88 mg/dL (ref 70–99)
Potassium: 4.6 meq/L (ref 3.5–5.1)
Sodium: 141 meq/L (ref 135–145)
Total Bilirubin: 0.5 mg/dL (ref 0.2–1.2)
Total Protein: 6.9 g/dL (ref 6.0–8.3)

## 2024-03-06 LAB — LIPID PANEL
Cholesterol: 181 mg/dL (ref 0–200)
HDL: 60.7 mg/dL (ref >39.00)
LDL Cholesterol: 99 mg/dL (ref 0–99)
NonHDL: 120
Total CHOL/HDL Ratio: 3
Triglycerides: 107 mg/dL (ref 0.0–149.0)
VLDL: 21.4 mg/dL (ref 0.0–40.0)

## 2024-03-06 LAB — URINALYSIS, ROUTINE W REFLEX MICROSCOPIC
Bilirubin Urine: NEGATIVE
Hgb urine dipstick: NEGATIVE
Ketones, ur: NEGATIVE
Nitrite: NEGATIVE
Specific Gravity, Urine: 1.015 (ref 1.000–1.030)
Total Protein, Urine: NEGATIVE
Urine Glucose: NEGATIVE
Urobilinogen, UA: 0.2 (ref 0.0–1.0)
pH: 7 (ref 5.0–8.0)

## 2024-03-06 LAB — CBC WITH DIFFERENTIAL/PLATELET
Basophils Absolute: 0 10*3/uL (ref 0.0–0.1)
Basophils Relative: 0.7 % (ref 0.0–3.0)
Eosinophils Absolute: 0.3 10*3/uL (ref 0.0–0.7)
Eosinophils Relative: 4.5 % (ref 0.0–5.0)
HCT: 41 % (ref 36.0–46.0)
Hemoglobin: 13.8 g/dL (ref 12.0–15.0)
Lymphocytes Relative: 19.1 % (ref 12.0–46.0)
Lymphs Abs: 1.2 10*3/uL (ref 0.7–4.0)
MCHC: 33.6 g/dL (ref 30.0–36.0)
MCV: 91.4 fl (ref 78.0–100.0)
Monocytes Absolute: 0.5 10*3/uL (ref 0.1–1.0)
Monocytes Relative: 7.9 % (ref 3.0–12.0)
Neutro Abs: 4.2 10*3/uL (ref 1.4–7.7)
Neutrophils Relative %: 67.8 % (ref 43.0–77.0)
Platelets: 232 10*3/uL (ref 150.0–400.0)
RBC: 4.48 Mil/uL (ref 3.87–5.11)
RDW: 13.6 % (ref 11.5–15.5)
WBC: 6.2 10*3/uL (ref 4.0–10.5)

## 2024-03-06 LAB — HEMOGLOBIN A1C: Hgb A1c MFr Bld: 5.9 % (ref 4.6–6.5)

## 2024-03-06 LAB — TSH: TSH: 1.23 u[IU]/mL (ref 0.35–5.50)

## 2024-03-06 MED ORDER — ASPIRIN 81 MG PO TBEC: 81.0000 mg | DELAYED_RELEASE_TABLET | Freq: Every day | ORAL | Status: AC

## 2024-03-06 NOTE — Progress Notes (Signed)
 Subjective:  Patient ID: Diane Sawyer, female    DOB: [birth year]  Age: 78 y.o. MRN: [id number]  CC: Advise (Patient advised to follow up with PCP regarding treatment options for breast cancer treatment post surgery )   HPI GALILEA QUITO presents for R breast cancer - s/p surgery  Per Dr Mosetta Putt:   Malignant neoplasm of upper-outer quadrant of right breast in female, estrogen receptor positive (HCC) pT1cN0M0, stage IA, ER+/PR+/HER2-, G1 -Discovered on screening mammogram -Status post lumpectomy, which showed a 1.2 cm invasive ductal carcinoma, with clear margins.  ER 100%, PR 100% positive, HER2 negative. -Given her advanced age and favorable features, adjuvant radiation was not recommended. -I recommend adjuvant antiestrogen tamoxifen for 5 years.    Dr Roselind Messier will decide on XRT  Outpatient Medications Prior to Visit  Medication Sig Dispense Refill   acetaminophen (TYLENOL) 500 MG tablet Take 1,000 mg by mouth every 6 (six) hours as needed for mild pain.     ALPRAZolam (XANAX) 0.25 MG tablet TAKE 1 TABLET(0.25 MG) BY MOUTH TWICE DAILY AS NEEDED FOR ANXIETY 180 tablet 1   amLODipine (NORVASC) 2.5 MG tablet Take 1 tablet (2.5 mg total) by mouth daily. 90 tablet 3   dorzolamide-timolol (COSOPT) 22.3-6.8 MG/ML ophthalmic solution 1 drop 2 (two) times daily.     estradiol (ESTRACE) 0.1 MG/GM vaginal cream Place 1 g vaginally 2 (two) times a week. 42.5 g 2   fexofenadine (ALLEGRA) 180 MG tablet Take 180 mg by mouth daily.     ipratropium (ATROVENT) 0.06 % nasal spray Place 2 sprays into the nose 3 (three) times daily. Annual appt due must see provider for future refills 45 mL 3   irbesartan (AVAPRO) 150 MG tablet TAKE 1 TABLET(150 MG) BY MOUTH DAILY 90 tablet 3   IRON PO Take 65 mg by mouth daily.     latanoprost (XALATAN) 0.005 % ophthalmic solution INT 1 GTT IN OU HS     pantoprazole (PROTONIX) 40 MG tablet TAKE 1 TABLET(40 MG) BY MOUTH TWICE DAILY 180 tablet 3   tamoxifen (NOLVADEX) 20 MG  tablet Take 1 tablet (20 mg total) by mouth daily. 30 tablet 3   traMADol (ULTRAM) 50 MG tablet Take 1 tablet (50 mg total) by mouth every 6 (six) hours as needed for moderate pain (pain score 4-6) or severe pain (pain score 7-10). 20 tablet 0   VITAMIN D PO Take 1,000 Units by mouth daily.     No facility-administered medications prior to visit.    ROS: Review of Systems  Constitutional:  Negative for activity change, appetite change, chills, fatigue and unexpected weight change.  HENT:  Negative for congestion, mouth sores and sinus pressure.   Eyes:  Negative for visual disturbance.  Respiratory:  Negative for cough and chest tightness.   Gastrointestinal:  Negative for abdominal pain and nausea.  Genitourinary:  Negative for difficulty urinating, frequency and vaginal pain.  Musculoskeletal:  Negative for back pain and gait problem.  Skin:  Negative for pallor and rash.  Neurological:  Negative for dizziness, tremors, weakness, numbness and headaches.  Psychiatric/Behavioral:  Negative for confusion, sleep disturbance and suicidal ideas.     Objective:  BP 112/84   Pulse 74   Temp 97.8 F (36.6 C)   Ht 5\' 3"  (1.6 m)   Wt 194 lb 6.4 oz (88.2 kg)   SpO2 93%   BMI 34.44 kg/m   BP Readings from Last 3 Encounters:  03/06/24 112/84  02/27/24 123/62  02/13/24 136/74    Wt Readings from Last 3 Encounters:  03/06/24 194 lb 6.4 oz (88.2 kg)  02/27/24 197 lb 11.2 oz (89.7 kg)  02/13/24 196 lb 6.9 oz (89.1 kg)    Physical Exam Constitutional:      General: She is not in acute distress.    Appearance: She is well-developed.  HENT:     Head: Normocephalic.     Right Ear: External ear normal.     Left Ear: External ear normal.     Nose: Nose normal.  Eyes:     General:        Right eye: No discharge.        Left eye: No discharge.     Conjunctiva/sclera: Conjunctivae normal.     Pupils: Pupils are equal, round, and reactive to light.  Neck:     Thyroid: No  thyromegaly.     Vascular: No JVD.     Trachea: No tracheal deviation.  Cardiovascular:     Rate and Rhythm: Normal rate and regular rhythm.     Heart sounds: Normal heart sounds.  Pulmonary:     Effort: No respiratory distress.     Breath sounds: No stridor. No wheezing.  Abdominal:     General: Bowel sounds are normal. There is no distension.     Palpations: Abdomen is soft. There is no mass.     Tenderness: There is no abdominal tenderness. There is no guarding or rebound.  Musculoskeletal:        General: No tenderness.     Cervical back: Normal range of motion and neck supple. No rigidity.     Right lower leg: No edema.     Left lower leg: No edema.  Lymphadenopathy:     Cervical: No cervical adenopathy.  Skin:    Findings: No erythema or rash.  Neurological:     Cranial Nerves: No cranial nerve deficit.     Motor: No abnormal muscle tone.     Coordination: Coordination normal.     Deep Tendon Reflexes: Reflexes normal.  Psychiatric:        Behavior: Behavior normal.        Thought Content: Thought content normal.        Judgment: Judgment normal.     Lab Results  Component Value Date   WBC 7.4 01/31/2024   HGB 13.9 01/31/2024   HCT 43.6 01/31/2024   PLT 211 01/31/2024   GLUCOSE 120 (H) 01/31/2024   CHOL 192 03/13/2023   TRIG 90.0 03/13/2023   HDL 58.80 03/13/2023   LDLCALC 115 (H) 03/13/2023   ALT 7 01/31/2024   AST 13 (L) 01/31/2024   NA 141 01/31/2024   K 4.0 01/31/2024   CL 106 01/31/2024   CREATININE 0.99 01/31/2024   BUN 18 01/31/2024   CO2 28 01/31/2024   TSH 0.93 03/13/2023   HGBA1C 5.7 10/21/2019    MM Breast Surgical Specimen Result Date: 02/13/2024 CLINICAL DATA:  Biopsy-proven invasive ductal carcinoma with extracellular mucin involving the RIGHT breast. Radioactive seed localization was performed yesterday in anticipation of today's lumpectomy. EXAM: SPECIMEN RADIOGRAPH OF THE RIGHT BREAST COMPARISON:  Previous exam(s). FINDINGS: Status post  excision of the RIGHT breast. The coil shaped tissue marking clip and the radioactive seed are present in the non-compressed specimen. The seed is intact. This was discussed by telephone with the operating room nurse at the time of interpretation on 02/13/2024 at 8 o'clock a.m. IMPRESSION: Specimen radiograph of the RIGHT breast.  Electronically Signed   By: Hulan Saas M.D.   On: 02/13/2024 08:04    Assessment & Plan:   Problem List Items Addressed This Visit     Vitamin D deficiency   Take Vit D      Hyperglycemia   Loose wt Check A1c      Prediabetes   Monitor A1c Wt Readings from Last 3 Encounters:  03/06/24 194 lb 6.4 oz (88.2 kg)  02/27/24 197 lb 11.2 oz (89.7 kg)  02/13/24 196 lb 6.9 oz (89.1 kg)         Malignant neoplasm of upper-outer quadrant of right breast in female, estrogen receptor positive (HCC) - Primary   Dr Magnus Ivan Dr Mosetta Putt: "Malignant neoplasm of upper-outer quadrant of right breast in female, estrogen receptor positive (HCC) pT1cN0M0, stage IA, ER+/PR+/HER2-, G1 -Discovered on screening mammogram -Status post lumpectomy, which showed a 1.2 cm invasive ductal carcinoma, with clear margins.  ER 100%, PR 100% positive, HER2 negative. -Given her advanced age and favorable features, adjuvant radiation was not recommended. -I recommend adjuvant antiestrogen tamoxifen for 5 years." Dr Roselind Messier will decide on XRT  Baby ASA every day if tolerated (fam h/o blood clots?) DVT prevention for travel Vit D qd      Relevant Medications   aspirin EC 81 MG tablet      Meds ordered this encounter  Medications   aspirin EC 81 MG tablet    Sig: Take 1 tablet (81 mg total) by mouth daily.      Follow-up: Return in about 3 months (around 06/05/2024) for a follow-up visit.  Sonda Primes, MD

## 2024-03-06 NOTE — Assessment & Plan Note (Signed)
 Monitor A1c Wt Readings from Last 3 Encounters:  03/06/24 194 lb 6.4 oz (88.2 kg)  02/27/24 197 lb 11.2 oz (89.7 kg)  02/13/24 196 lb 6.9 oz (89.1 kg)

## 2024-03-06 NOTE — Assessment & Plan Note (Signed)
Take Vit D 

## 2024-03-06 NOTE — Assessment & Plan Note (Signed)
Loose wt Check A1c

## 2024-03-06 NOTE — Assessment & Plan Note (Addendum)
 Dr Magnus Ivan Dr Mosetta Putt: "Malignant neoplasm of upper-outer quadrant of right breast in female, estrogen receptor positive (HCC) pT1cN0M0, stage IA, ER+/PR+/HER2-, G1 -Discovered on screening mammogram -Status post lumpectomy, which showed a 1.2 cm invasive ductal carcinoma, with clear margins.  ER 100%, PR 100% positive, HER2 negative. -Given her advanced age and favorable features, adjuvant radiation was not recommended. -I recommend adjuvant antiestrogen tamoxifen for 5 years." Dr Roselind Messier will decide on XRT  Baby ASA every day if tolerated (fam h/o blood clots?) DVT prevention for travel Vit D qd

## 2024-03-07 ENCOUNTER — Encounter: Payer: Self-pay | Admitting: Internal Medicine

## 2024-03-07 ENCOUNTER — Other Ambulatory Visit: Payer: Self-pay | Admitting: Internal Medicine

## 2024-03-07 LAB — IRON,TIBC AND FERRITIN PANEL
%SAT: 25 % (ref 16–45)
Ferritin: 348 ng/mL — ABNORMAL HIGH (ref 16–288)
Iron: 71 ug/dL (ref 45–160)
TIBC: 288 ug/dL (ref 250–450)

## 2024-03-07 MED ORDER — CEFUROXIME AXETIL 250 MG PO TABS
250.0000 mg | ORAL_TABLET | Freq: Two times a day (BID) | ORAL | 0 refills | Status: DC
Start: 2024-03-07 — End: 2024-06-05

## 2024-03-08 ENCOUNTER — Other Ambulatory Visit: Payer: Self-pay | Admitting: *Deleted

## 2024-03-08 DIAGNOSIS — Z170 Estrogen receptor positive status [ER+]: Secondary | ICD-10-CM

## 2024-03-12 NOTE — Progress Notes (Addendum)
 Location of Breast Cancer:Right side   Histology per Pathology Report:    Receptor Status: ER(+), PR (+), Her2-neu (0+), Ki-67(5%)  Did patient present with symptoms (if so, please note symptoms) or was this found on screening mammography?: Mammogram  Past/Anticipated interventions by surgeon, if ZOX:WRUEA    Past/Anticipated interventions by medical oncology, if any: Dr. Maryalice Smaller  Plan -She is recovering well from surgery -I called in tamoxifen today, she will start in about 2 weeks -I sent a message to Dr. Eloise Hake to see if he feels OK to forgo adjuvant radiation -Survivorship in 3 months with NP, follow-up with me in 6 months   Lymphedema issues, if any:     Pain issues, if any:  No   SAFETY ISSUES: Prior radiation? No Pacemaker/ICD? No Possible current pregnancy?N/A Is the patient on methotrexate? No    BP (!) 150/76 (BP Location: Left Arm, Patient Position: Sitting)   Pulse 73   Temp (!) 97.1 F (36.2 C) (Temporal)   Resp 18   Ht 5\' 3"  (1.6 m)   Wt 201 lb 6 oz (91.3 kg)   SpO2 98%   BMI 35.67 kg/m

## 2024-03-16 NOTE — Progress Notes (Signed)
 Radiation Oncology         [phone] ________________________________  Name: Diane Sawyer MRN: [id number]  Date: 03/18/2024  DOB: Jul 04, 1946  Re-Evaluation Note  CC: Plotnikov, Oakley Bellman, MD  Sonja Luana, MD  No diagnosis found.  Diagnosis: Stage IA (pT1c, pN0, cM0) Right Breast UOQ, Invasive Ductal Carcinoma, ER+ / PR+ / Her2-, Grade 1: s/p right breast lumpectomy without nodal biopsies   Narrative:  The patient returns today to discuss radiation treatment options. She was seen in the multidisciplinary breast clinic on 01/31/24.   She underwent genetic testing on her consultation date. Results showed no clinically significant variants detected by BRCA1/2 analyses with +RNAinsight testing. However, a variant of unknown significance was detected in the POLE gene.   Since her consultation date he opted to proceed with a right breast lumpectomy without nodal biopsies on 02/13/23 under the care of Dr. Lucienne Ryder. Pathology from the procedure revealed: tumor the size of 1.2 cm; histology of grade 1 invasive ductal carcinoma; all margins negative for invasive carcinoma; margin status to invasive disease of 0.1 mm from the posterior margin; negative for LVI; no lymph nodes were examined. Prognostic indicators significant for: estrogen receptor 100% positive and progesterone receptor 100% positive, both with strong staining intensity; Proliferation marker Ki67 at 5%; Her2 status negative; Grade 1.   She was most recently seen by Dr. Maryalice Smaller on 02/27/24 and she had agreed to proceed with antiestrogen therapy consisting of tamoxifen following XRT.   On review of systems, the patient reports ***. She denies *** and any other symptoms.    Allergies:  is allergic to benazepril hcl, levofloxacin, and mobic [meloxicam].  Meds: Current Outpatient Medications  Medication Sig Dispense Refill   acetaminophen (TYLENOL) 500 MG tablet Take 1,000 mg by mouth every 6 (six) hours as needed for mild pain.      ALPRAZolam (XANAX) 0.25 MG tablet TAKE 1 TABLET(0.25 MG) BY MOUTH TWICE DAILY AS NEEDED FOR ANXIETY 180 tablet 1   amLODipine (NORVASC) 2.5 MG tablet Take 1 tablet (2.5 mg total) by mouth daily. 90 tablet 3   aspirin EC 81 MG tablet Take 1 tablet (81 mg total) by mouth daily.     cefUROXime (CEFTIN) 250 MG tablet Take 1 tablet (250 mg total) by mouth 2 (two) times daily with a meal. 10 tablet 0   dorzolamide-timolol (COSOPT) 22.3-6.8 MG/ML ophthalmic solution 1 drop 2 (two) times daily.     estradiol (ESTRACE) 0.1 MG/GM vaginal cream Place 1 g vaginally 2 (two) times a week. 42.5 g 2   fexofenadine (ALLEGRA) 180 MG tablet Take 180 mg by mouth daily.     ipratropium (ATROVENT) 0.06 % nasal spray Place 2 sprays into the nose 3 (three) times daily. Annual appt due must see provider for future refills 45 mL 3   irbesartan (AVAPRO) 150 MG tablet TAKE 1 TABLET(150 MG) BY MOUTH DAILY 90 tablet 3   IRON PO Take 65 mg by mouth daily.     latanoprost (XALATAN) 0.005 % ophthalmic solution INT 1 GTT IN OU HS     pantoprazole (PROTONIX) 40 MG tablet TAKE 1 TABLET(40 MG) BY MOUTH TWICE DAILY 180 tablet 3   tamoxifen (NOLVADEX) 20 MG tablet Take 1 tablet (20 mg total) by mouth daily. 30 tablet 3   traMADol (ULTRAM) 50 MG tablet Take 1 tablet (50 mg total) by mouth every 6 (six) hours as needed for moderate pain (pain score 4-6) or severe pain (pain score 7-10). 20 tablet 0  VITAMIN D PO Take 1,000 Units by mouth daily.     No current facility-administered medications for this encounter.    Physical Findings: The patient is in no acute distress. Patient is alert and oriented.  vitals were not taken for this visit.  No significant changes. Lungs are clear to auscultation bilaterally. Heart has regular rate and rhythm. No palpable cervical, supraclavicular, or axillary adenopathy. Abdomen soft, non-tender, normal bowel sounds. Left Breast: no palpable mass, nipple discharge or bleeding. Right Breast:  ***  Lab Findings: Lab Results  Component Value Date   WBC 6.2 03/06/2024   HGB 13.8 03/06/2024   HCT 41.0 03/06/2024   MCV 91.4 03/06/2024   PLT 232.0 03/06/2024    Radiographic Findings: No results found.  Impression:  Stage IA (pT1c, pN0, cM0) Right Breast UOQ, Invasive Ductal Carcinoma, ER+ / PR+ / Her2-, Grade 1: s/p right breast lumpectomy without nodal biopsies   ***  Plan:  Patient is scheduled for CT simulation {date/later today}. ***  -----------------------------------  Noralee Beam, PhD, MD  This document serves as a record of services personally performed by Retta Caster, MD. It was created on his behalf by Aleta Anda, a trained medical scribe. The creation of this record is based on the scribe's personal observations and the provider's statements to them. This document has been checked and approved by the attending provider.

## 2024-03-18 ENCOUNTER — Ambulatory Visit
Admission: RE | Admit: 2024-03-18 | Discharge: 2024-03-18 | Disposition: A | Discharge status: Home / Self Care | Source: Ambulatory Visit | Attending: Radiation Oncology | Admitting: Radiation Oncology

## 2024-03-18 VITALS — BP 150/76 | HR 73 | Temp 97.1°F | Resp 18 | Ht 63.0 in | Wt 201.4 lb

## 2024-03-18 DIAGNOSIS — Z7982 Long term (current) use of aspirin: Secondary | ICD-10-CM | POA: Diagnosis not present

## 2024-03-18 DIAGNOSIS — Z170 Estrogen receptor positive status [ER+]: Secondary | ICD-10-CM | POA: Diagnosis not present

## 2024-03-18 DIAGNOSIS — C50411 Malignant neoplasm of upper-outer quadrant of right female breast: Secondary | ICD-10-CM | POA: Insufficient documentation

## 2024-03-18 DIAGNOSIS — Z510 Encounter for antineoplastic radiation therapy: Secondary | ICD-10-CM | POA: Diagnosis not present

## 2024-03-18 DIAGNOSIS — Z79899 Other long term (current) drug therapy: Secondary | ICD-10-CM | POA: Diagnosis not present

## 2024-03-20 DIAGNOSIS — Z170 Estrogen receptor positive status [ER+]: Secondary | ICD-10-CM | POA: Diagnosis not present

## 2024-03-20 DIAGNOSIS — Z510 Encounter for antineoplastic radiation therapy: Secondary | ICD-10-CM | POA: Diagnosis not present

## 2024-03-20 DIAGNOSIS — C50411 Malignant neoplasm of upper-outer quadrant of right female breast: Secondary | ICD-10-CM | POA: Diagnosis not present

## 2024-03-25 ENCOUNTER — Ambulatory Visit: Admitting: Radiation Oncology

## 2024-03-25 ENCOUNTER — Encounter: Payer: Self-pay | Admitting: *Deleted

## 2024-03-25 DIAGNOSIS — Z170 Estrogen receptor positive status [ER+]: Secondary | ICD-10-CM

## 2024-03-26 ENCOUNTER — Other Ambulatory Visit: Payer: Self-pay

## 2024-03-26 ENCOUNTER — Ambulatory Visit
Admission: RE | Admit: 2024-03-26 | Discharge: 2024-03-26 | Disposition: A | Discharge status: Home / Self Care | Source: Ambulatory Visit | Attending: Radiation Oncology | Admitting: Radiation Oncology

## 2024-03-26 ENCOUNTER — Ambulatory Visit

## 2024-03-26 DIAGNOSIS — Z170 Estrogen receptor positive status [ER+]: Secondary | ICD-10-CM | POA: Diagnosis not present

## 2024-03-26 DIAGNOSIS — C50411 Malignant neoplasm of upper-outer quadrant of right female breast: Secondary | ICD-10-CM | POA: Diagnosis not present

## 2024-03-26 DIAGNOSIS — Z510 Encounter for antineoplastic radiation therapy: Secondary | ICD-10-CM | POA: Diagnosis not present

## 2024-03-26 LAB — RAD ONC ARIA SESSION SUMMARY
Course Elapsed Days: 0
Plan Fractions Treated to Date: 1
Plan Prescribed Dose Per Fraction: 2.67 Gy
Plan Total Fractions Prescribed: 15
Plan Total Prescribed Dose: 40.05 Gy
Reference Point Dosage Given to Date: 2.67 Gy
Reference Point Session Dosage Given: 2.67 Gy
Session Number: 1

## 2024-03-27 ENCOUNTER — Ambulatory Visit
Admission: RE | Admit: 2024-03-27 | Discharge: 2024-03-27 | Discharge status: Home / Self Care | Source: Ambulatory Visit | Attending: Radiation Oncology | Admitting: Radiation Oncology

## 2024-03-27 ENCOUNTER — Other Ambulatory Visit: Payer: Self-pay

## 2024-03-27 DIAGNOSIS — Z170 Estrogen receptor positive status [ER+]: Secondary | ICD-10-CM | POA: Diagnosis not present

## 2024-03-27 DIAGNOSIS — C50411 Malignant neoplasm of upper-outer quadrant of right female breast: Secondary | ICD-10-CM | POA: Diagnosis not present

## 2024-03-27 DIAGNOSIS — Z510 Encounter for antineoplastic radiation therapy: Secondary | ICD-10-CM | POA: Diagnosis not present

## 2024-03-27 LAB — RAD ONC ARIA SESSION SUMMARY
Course Elapsed Days: 1
Plan Fractions Treated to Date: 2
Plan Prescribed Dose Per Fraction: 2.67 Gy
Plan Total Fractions Prescribed: 15
Plan Total Prescribed Dose: 40.05 Gy
Reference Point Dosage Given to Date: 5.34 Gy
Reference Point Session Dosage Given: 2.67 Gy
Session Number: 2

## 2024-03-27 MED ORDER — ALRA NON-METALLIC DEODORANT (RAD-ONC)
1.0000 | Freq: Once | TOPICAL | Status: AC
  Administered 2024-03-27: 1 via TOPICAL

## 2024-03-27 MED ORDER — RADIAPLEXRX EX GEL: Freq: Once | CUTANEOUS | Status: AC

## 2024-03-28 ENCOUNTER — Ambulatory Visit
Admission: RE | Admit: 2024-03-28 | Discharge: 2024-03-28 | Discharge status: Home / Self Care | Source: Ambulatory Visit | Attending: Radiation Oncology | Admitting: Radiation Oncology

## 2024-03-28 ENCOUNTER — Other Ambulatory Visit: Payer: Self-pay

## 2024-03-28 DIAGNOSIS — Z170 Estrogen receptor positive status [ER+]: Secondary | ICD-10-CM | POA: Diagnosis not present

## 2024-03-28 DIAGNOSIS — Z510 Encounter for antineoplastic radiation therapy: Secondary | ICD-10-CM | POA: Diagnosis not present

## 2024-03-28 DIAGNOSIS — C50411 Malignant neoplasm of upper-outer quadrant of right female breast: Secondary | ICD-10-CM | POA: Diagnosis not present

## 2024-03-28 LAB — RAD ONC ARIA SESSION SUMMARY
Course Elapsed Days: 2
Plan Fractions Treated to Date: 3
Plan Prescribed Dose Per Fraction: 2.67 Gy
Plan Total Fractions Prescribed: 15
Plan Total Prescribed Dose: 40.05 Gy
Reference Point Dosage Given to Date: 8.01 Gy
Reference Point Session Dosage Given: 2.67 Gy
Session Number: 3

## 2024-03-29 ENCOUNTER — Ambulatory Visit
Admission: RE | Admit: 2024-03-29 | Discharge: 2024-03-29 | Disposition: A | Discharge status: Home / Self Care | Source: Ambulatory Visit | Attending: Radiation Oncology | Admitting: Radiation Oncology

## 2024-03-29 ENCOUNTER — Other Ambulatory Visit: Payer: Self-pay

## 2024-03-29 DIAGNOSIS — Z170 Estrogen receptor positive status [ER+]: Secondary | ICD-10-CM | POA: Diagnosis not present

## 2024-03-29 DIAGNOSIS — C50411 Malignant neoplasm of upper-outer quadrant of right female breast: Secondary | ICD-10-CM | POA: Diagnosis not present

## 2024-03-29 DIAGNOSIS — Z510 Encounter for antineoplastic radiation therapy: Secondary | ICD-10-CM | POA: Diagnosis not present

## 2024-03-29 LAB — RAD ONC ARIA SESSION SUMMARY
Course Elapsed Days: 3
Plan Fractions Treated to Date: 4
Plan Prescribed Dose Per Fraction: 2.67 Gy
Plan Total Fractions Prescribed: 15
Plan Total Prescribed Dose: 40.05 Gy
Reference Point Dosage Given to Date: 10.68 Gy
Reference Point Session Dosage Given: 2.67 Gy
Session Number: 4

## 2024-04-01 ENCOUNTER — Other Ambulatory Visit: Payer: Self-pay

## 2024-04-01 ENCOUNTER — Ambulatory Visit
Admission: RE | Admit: 2024-04-01 | Discharge: 2024-04-01 | Disposition: A | Discharge status: Home / Self Care | Source: Ambulatory Visit | Attending: Radiation Oncology | Admitting: Radiation Oncology

## 2024-04-01 DIAGNOSIS — C50411 Malignant neoplasm of upper-outer quadrant of right female breast: Secondary | ICD-10-CM | POA: Diagnosis not present

## 2024-04-01 DIAGNOSIS — Z510 Encounter for antineoplastic radiation therapy: Secondary | ICD-10-CM | POA: Diagnosis not present

## 2024-04-01 DIAGNOSIS — Z170 Estrogen receptor positive status [ER+]: Secondary | ICD-10-CM | POA: Diagnosis not present

## 2024-04-01 LAB — RAD ONC ARIA SESSION SUMMARY
Course Elapsed Days: 6
Plan Fractions Treated to Date: 5
Plan Prescribed Dose Per Fraction: 2.67 Gy
Plan Total Fractions Prescribed: 15
Plan Total Prescribed Dose: 40.05 Gy
Reference Point Dosage Given to Date: 13.35 Gy
Reference Point Session Dosage Given: 2.67 Gy
Session Number: 5

## 2024-04-02 ENCOUNTER — Ambulatory Visit
Admission: RE | Admit: 2024-04-02 | Discharge: 2024-04-02 | Disposition: A | Discharge status: Home / Self Care | Source: Ambulatory Visit | Attending: Radiation Oncology | Admitting: Radiation Oncology

## 2024-04-02 ENCOUNTER — Other Ambulatory Visit: Payer: Self-pay

## 2024-04-02 DIAGNOSIS — C50411 Malignant neoplasm of upper-outer quadrant of right female breast: Secondary | ICD-10-CM | POA: Diagnosis not present

## 2024-04-02 DIAGNOSIS — Z170 Estrogen receptor positive status [ER+]: Secondary | ICD-10-CM | POA: Diagnosis not present

## 2024-04-02 DIAGNOSIS — Z510 Encounter for antineoplastic radiation therapy: Secondary | ICD-10-CM | POA: Diagnosis not present

## 2024-04-02 LAB — RAD ONC ARIA SESSION SUMMARY
Course Elapsed Days: 7
Plan Fractions Treated to Date: 6
Plan Prescribed Dose Per Fraction: 2.67 Gy
Plan Total Fractions Prescribed: 15
Plan Total Prescribed Dose: 40.05 Gy
Reference Point Dosage Given to Date: 16.02 Gy
Reference Point Session Dosage Given: 2.67 Gy
Session Number: 6

## 2024-04-03 ENCOUNTER — Ambulatory Visit
Admission: RE | Admit: 2024-04-03 | Discharge: 2024-04-03 | Disposition: A | Discharge status: Home / Self Care | Source: Ambulatory Visit | Attending: Radiation Oncology | Admitting: Radiation Oncology

## 2024-04-03 ENCOUNTER — Ambulatory Visit (INDEPENDENT_AMBULATORY_CARE_PROVIDER_SITE_OTHER): Payer: BLUE CROSS/BLUE SHIELD | Admitting: Nurse Practitioner

## 2024-04-03 ENCOUNTER — Encounter: Payer: Self-pay | Admitting: Nurse Practitioner

## 2024-04-03 ENCOUNTER — Other Ambulatory Visit: Payer: Self-pay

## 2024-04-03 VITALS — BP 116/80 | HR 81

## 2024-04-03 DIAGNOSIS — Z510 Encounter for antineoplastic radiation therapy: Secondary | ICD-10-CM | POA: Diagnosis not present

## 2024-04-03 DIAGNOSIS — N952 Postmenopausal atrophic vaginitis: Secondary | ICD-10-CM | POA: Diagnosis not present

## 2024-04-03 DIAGNOSIS — N8111 Cystocele, midline: Secondary | ICD-10-CM | POA: Diagnosis not present

## 2024-04-03 DIAGNOSIS — Z170 Estrogen receptor positive status [ER+]: Secondary | ICD-10-CM | POA: Diagnosis not present

## 2024-04-03 DIAGNOSIS — C50411 Malignant neoplasm of upper-outer quadrant of right female breast: Secondary | ICD-10-CM | POA: Diagnosis not present

## 2024-04-03 DIAGNOSIS — Z4689 Encounter for fitting and adjustment of other specified devices: Secondary | ICD-10-CM

## 2024-04-03 LAB — RAD ONC ARIA SESSION SUMMARY
Course Elapsed Days: 8
Plan Fractions Treated to Date: 7
Plan Prescribed Dose Per Fraction: 2.67 Gy
Plan Total Fractions Prescribed: 15
Plan Total Prescribed Dose: 40.05 Gy
Reference Point Dosage Given to Date: 18.69 Gy
Reference Point Session Dosage Given: 2.67 Gy
Session Number: 7

## 2024-04-03 NOTE — Progress Notes (Signed)
   Acute Office Visit  Subjective:    Patient ID: Diane Sawyer, female    DOB: [birth year], 78 y.o.   MRN: [id number]   HPI 78 y.o. presents today for pessary maintenance. Using #4 ring pessary with support. Good management. Stopped vaginal estrogen when diagnosed with ER+/PR+/HER2 neg right breast cancer in February but restarted it a couple of weeks ago after surgeon said she could continue use.   No LMP recorded. Patient is postmenopausal.    Review of Systems  Constitutional: Negative.   Genitourinary: Negative.        Objective:    Physical Exam Constitutional:      Appearance: Normal appearance.  Genitourinary:    General: Normal vulva.     Vagina: Normal.     Cervix: Normal.     Comments: Cystocele present    BP 116/80 (BP Location: Left Arm, Patient Position: Sitting, Cuff Size: Normal)   Pulse 81   SpO2 90%  Wt Readings from Last 3 Encounters:  03/18/24 201 lb 6 oz (91.3 kg)  03/06/24 194 lb 6.4 oz (88.2 kg)  02/27/24 197 lb 11.2 oz (89.7 kg)        Patient informed chaperone available to be present for breast and/or pelvic exam. Patient has requested no chaperone to be present. Patient has been advised what will be completed during breast and pelvic exam.   Assessment & Plan:   Problem List Items Addressed This Visit   None Visit Diagnoses       Pessary maintenance    -  Primary     Vaginal atrophy         Cystocele, midline          Plan: Normal vaginal exam, no evidence of irritation, lesions or abnormal discharge. Pessary removed, clean and reinserted with ease. Recommend discussing vaginal estrogen use with oncologist.   Return in about 3 months (around 07/03/2024).    Andee Bamberger DNP, 2:45 PM 04/03/2024

## 2024-04-04 ENCOUNTER — Ambulatory Visit
Admission: RE | Admit: 2024-04-04 | Discharge: 2024-04-04 | Disposition: A | Discharge status: Home / Self Care | Source: Ambulatory Visit | Attending: Radiation Oncology | Admitting: Radiation Oncology

## 2024-04-04 ENCOUNTER — Other Ambulatory Visit: Payer: Self-pay

## 2024-04-04 DIAGNOSIS — C50411 Malignant neoplasm of upper-outer quadrant of right female breast: Secondary | ICD-10-CM | POA: Insufficient documentation

## 2024-04-04 DIAGNOSIS — Z170 Estrogen receptor positive status [ER+]: Secondary | ICD-10-CM | POA: Diagnosis not present

## 2024-04-04 DIAGNOSIS — Z510 Encounter for antineoplastic radiation therapy: Secondary | ICD-10-CM | POA: Diagnosis not present

## 2024-04-04 LAB — RAD ONC ARIA SESSION SUMMARY
Course Elapsed Days: 9
Plan Fractions Treated to Date: 8
Plan Prescribed Dose Per Fraction: 2.67 Gy
Plan Total Fractions Prescribed: 15
Plan Total Prescribed Dose: 40.05 Gy
Reference Point Dosage Given to Date: 21.36 Gy
Reference Point Session Dosage Given: 2.67 Gy
Session Number: 8

## 2024-04-05 ENCOUNTER — Ambulatory Visit
Admission: RE | Admit: 2024-04-05 | Discharge: 2024-04-05 | Disposition: A | Discharge status: Home / Self Care | Source: Ambulatory Visit | Attending: Radiation Oncology | Admitting: Radiation Oncology

## 2024-04-05 ENCOUNTER — Other Ambulatory Visit: Payer: Self-pay

## 2024-04-05 DIAGNOSIS — C50411 Malignant neoplasm of upper-outer quadrant of right female breast: Secondary | ICD-10-CM | POA: Diagnosis not present

## 2024-04-05 DIAGNOSIS — Z510 Encounter for antineoplastic radiation therapy: Secondary | ICD-10-CM | POA: Diagnosis not present

## 2024-04-05 DIAGNOSIS — Z170 Estrogen receptor positive status [ER+]: Secondary | ICD-10-CM | POA: Diagnosis not present

## 2024-04-05 LAB — RAD ONC ARIA SESSION SUMMARY
Course Elapsed Days: 10
Plan Fractions Treated to Date: 9
Plan Prescribed Dose Per Fraction: 2.67 Gy
Plan Total Fractions Prescribed: 15
Plan Total Prescribed Dose: 40.05 Gy
Reference Point Dosage Given to Date: 24.03 Gy
Reference Point Session Dosage Given: 2.67 Gy
Session Number: 9

## 2024-04-08 ENCOUNTER — Ambulatory Visit
Admission: RE | Admit: 2024-04-08 | Discharge: 2024-04-08 | Disposition: A | Discharge status: Home / Self Care | Source: Ambulatory Visit | Attending: Radiation Oncology | Admitting: Radiation Oncology

## 2024-04-08 ENCOUNTER — Encounter: Payer: Self-pay | Admitting: Internal Medicine

## 2024-04-08 ENCOUNTER — Ambulatory Visit (INDEPENDENT_AMBULATORY_CARE_PROVIDER_SITE_OTHER): Payer: BLUE CROSS/BLUE SHIELD | Admitting: Internal Medicine

## 2024-04-08 ENCOUNTER — Ambulatory Visit: Admitting: Radiation Oncology

## 2024-04-08 ENCOUNTER — Other Ambulatory Visit: Payer: Self-pay

## 2024-04-08 VITALS — BP 118/78 | HR 68 | Temp 98.0°F | Ht 63.0 in | Wt 196.2 lb

## 2024-04-08 DIAGNOSIS — C50411 Malignant neoplasm of upper-outer quadrant of right female breast: Secondary | ICD-10-CM | POA: Diagnosis not present

## 2024-04-08 DIAGNOSIS — F419 Anxiety disorder, unspecified: Secondary | ICD-10-CM | POA: Diagnosis not present

## 2024-04-08 DIAGNOSIS — I10 Essential (primary) hypertension: Secondary | ICD-10-CM | POA: Diagnosis not present

## 2024-04-08 DIAGNOSIS — G4700 Insomnia, unspecified: Secondary | ICD-10-CM | POA: Diagnosis not present

## 2024-04-08 DIAGNOSIS — Z170 Estrogen receptor positive status [ER+]: Secondary | ICD-10-CM

## 2024-04-08 DIAGNOSIS — K219 Gastro-esophageal reflux disease without esophagitis: Secondary | ICD-10-CM

## 2024-04-08 DIAGNOSIS — Z510 Encounter for antineoplastic radiation therapy: Secondary | ICD-10-CM | POA: Diagnosis not present

## 2024-04-08 LAB — RAD ONC ARIA SESSION SUMMARY
Course Elapsed Days: 13
Plan Fractions Treated to Date: 10
Plan Prescribed Dose Per Fraction: 2.67 Gy
Plan Total Fractions Prescribed: 15
Plan Total Prescribed Dose: 40.05 Gy
Reference Point Dosage Given to Date: 26.7 Gy
Reference Point Session Dosage Given: 2.67 Gy
Session Number: 10

## 2024-04-08 MED ORDER — PANTOPRAZOLE SODIUM 40 MG PO TBEC: DELAYED_RELEASE_TABLET | ORAL | 3 refills | Status: AC

## 2024-04-08 MED ORDER — AMLODIPINE BESYLATE 2.5 MG PO TABS: 2.5000 mg | ORAL_TABLET | Freq: Every day | ORAL | 3 refills | Status: AC

## 2024-04-08 MED ORDER — IPRATROPIUM BROMIDE 0.06 % NA SOLN: 2.0000 | Freq: Three times a day (TID) | NASAL | 3 refills | Status: DC

## 2024-04-08 MED ORDER — IRBESARTAN 150 MG PO TABS: ORAL_TABLET | ORAL | 3 refills | Status: AC

## 2024-04-08 MED ORDER — ALPRAZOLAM 0.25 MG PO TABS: ORAL_TABLET | ORAL | 1 refills | Status: DC

## 2024-04-08 NOTE — Assessment & Plan Note (Signed)
 Dr Lucienne Ryder Dr Maryalice Smaller: "Malignant neoplasm of upper-outer quadrant of right breast in female, estrogen receptor positive (HCC) pT1cN0M0, stage IA, ER+/PR+/HER2-, G1 -Discovered on screening mammogram -Status post lumpectomy, which showed a 1.2 cm invasive ductal carcinoma, with clear margins.  ER 100%, PR 100% positive, HER2 negative. -Given her advanced age and favorable features, adjuvant radiation was not recommended. -I recommend adjuvant antiestrogen tamoxifen  for 5 years." Dr Eloise Hake -XRT x 4 wks  Baby ASA every day if tolerated (fam h/o blood clots?) DVT prevention for travel Vit D qd

## 2024-04-08 NOTE — Assessment & Plan Note (Signed)
Continue on Xanax prn  Potential benefits of a long term benzodiazepines  use as well as potential risks  and complications were explained to the patient and were aknowledged. 

## 2024-04-08 NOTE — Assessment & Plan Note (Signed)
 On Protonix bid

## 2024-04-08 NOTE — Assessment & Plan Note (Signed)
We cont on Amlodipine 2.5 mg/d and Irbesartan 150 mg/d

## 2024-04-08 NOTE — Progress Notes (Signed)
 Subjective:  Patient ID: Diane Sawyer, female    DOB: 03-Jan-1946  Age: 78 y.o. MRN: [id number]  CC: Medical Management of Chronic Issues (6 Month Follow Up. Patient notes that they have been doing well with the radiation treatments. Work performance and hours have not changed. Patient concerned with future fatigue, only has 2 weeks left of treatments )   HPI DESTINEE SUING presents for breast cancer, anxiety, HTN  Outpatient Medications Prior to Visit  Medication Sig Dispense Refill   acetaminophen  (TYLENOL ) 500 MG tablet Take 1,000 mg by mouth every 6 (six) hours as needed for mild pain.     aspirin  EC 81 MG tablet Take 1 tablet (81 mg total) by mouth daily.     cefUROXime  (CEFTIN ) 250 MG tablet Take 1 tablet (250 mg total) by mouth 2 (two) times daily with a meal. 10 tablet 0   dorzolamide-timolol (COSOPT) 22.3-6.8 MG/ML ophthalmic solution 1 drop 2 (two) times daily.     estradiol  (ESTRACE ) 0.1 MG/GM vaginal cream Place 1 g vaginally 2 (two) times a week. 42.5 g 2   fexofenadine (ALLEGRA) 180 MG tablet Take 180 mg by mouth daily.     IRON PO Take 65 mg by mouth daily.     latanoprost (XALATAN) 0.005 % ophthalmic solution INT 1 GTT IN OU HS     VITAMIN D  PO Take 1,000 Units by mouth daily.     ALPRAZolam  (XANAX ) 0.25 MG tablet TAKE 1 TABLET(0.25 MG) BY MOUTH TWICE DAILY AS NEEDED FOR ANXIETY 180 tablet 1   amLODipine  (NORVASC ) 2.5 MG tablet Take 1 tablet (2.5 mg total) by mouth daily. 90 tablet 3   ipratropium (ATROVENT ) 0.06 % nasal spray Place 2 sprays into the nose 3 (three) times daily. Annual appt due must see provider for future refills 45 mL 3   irbesartan  (AVAPRO ) 150 MG tablet TAKE 1 TABLET(150 MG) BY MOUTH DAILY 90 tablet 3   pantoprazole  (PROTONIX ) 40 MG tablet TAKE 1 TABLET(40 MG) BY MOUTH TWICE DAILY 180 tablet 3   methocarbamol  (ROBAXIN ) 500 MG tablet Take 500 mg by mouth. (Patient not taking: Reported on 04/08/2024)     tamoxifen  (NOLVADEX ) 20 MG tablet Take 1 tablet (20 mg  total) by mouth daily. (Patient not taking: Reported on 04/08/2024) 30 tablet 3   traMADol  (ULTRAM ) 50 MG tablet Take 1 tablet (50 mg total) by mouth every 6 (six) hours as needed for moderate pain (pain score 4-6) or severe pain (pain score 7-10). (Patient not taking: Reported on 03/18/2024) 20 tablet 0   No facility-administered medications prior to visit.    ROS: Review of Systems  Constitutional:  Negative for activity change, appetite change, chills, fatigue and unexpected weight change.  HENT:  Negative for congestion, mouth sores and sinus pressure.   Eyes:  Negative for visual disturbance.  Respiratory:  Negative for cough and chest tightness.   Gastrointestinal:  Negative for abdominal pain and nausea.  Genitourinary:  Negative for difficulty urinating, frequency and vaginal pain.  Musculoskeletal:  Negative for back pain and gait problem.  Skin:  Negative for pallor and rash.  Neurological:  Negative for dizziness, tremors, weakness, numbness and headaches.  Psychiatric/Behavioral:  Negative for confusion and sleep disturbance.     Objective:  BP 118/78   Pulse 68   Temp 98 F (36.7 C)   Ht 5\' 3"  (1.6 m)   Wt 196 lb 3.2 oz (89 kg)   SpO2 98%   BMI 34.76 kg/m  BP Readings from Last 3 Encounters:  04/08/24 118/78  04/03/24 116/80  03/18/24 (!) 150/76    Wt Readings from Last 3 Encounters:  04/08/24 196 lb 3.2 oz (89 kg)  03/18/24 201 lb 6 oz (91.3 kg)  03/06/24 194 lb 6.4 oz (88.2 kg)    Physical Exam Constitutional:      General: She is not in acute distress.    Appearance: She is well-developed. She is obese.  HENT:     Head: Normocephalic.     Right Ear: External ear normal.     Left Ear: External ear normal.     Nose: Nose normal.  Eyes:     General:        Right eye: No discharge.        Left eye: No discharge.     Conjunctiva/sclera: Conjunctivae normal.     Pupils: Pupils are equal, round, and reactive to light.  Neck:     Thyroid : No  thyromegaly.     Vascular: No JVD.     Trachea: No tracheal deviation.  Cardiovascular:     Rate and Rhythm: Normal rate and regular rhythm.     Heart sounds: Normal heart sounds.  Pulmonary:     Effort: No respiratory distress.     Breath sounds: No stridor. No wheezing.  Abdominal:     General: Bowel sounds are normal. There is no distension.     Palpations: Abdomen is soft. There is no mass.     Tenderness: There is no abdominal tenderness. There is no guarding or rebound.  Musculoskeletal:        General: No tenderness.     Cervical back: Normal range of motion and neck supple. No rigidity.  Lymphadenopathy:     Cervical: No cervical adenopathy.  Skin:    Findings: No erythema or rash.  Neurological:     Cranial Nerves: No cranial nerve deficit.     Motor: No abnormal muscle tone.     Coordination: Coordination normal.     Deep Tendon Reflexes: Reflexes normal.  Psychiatric:        Behavior: Behavior normal.        Thought Content: Thought content normal.        Judgment: Judgment normal.     Lab Results  Component Value Date   WBC 6.2 03/06/2024   HGB 13.8 03/06/2024   HCT 41.0 03/06/2024   PLT 232.0 03/06/2024   GLUCOSE 88 03/06/2024   CHOL 181 03/06/2024   TRIG 107.0 03/06/2024   HDL 60.70 03/06/2024   LDLCALC 99 03/06/2024   ALT 7 03/06/2024   AST 13 03/06/2024   NA 141 03/06/2024   K 4.6 03/06/2024   CL 105 03/06/2024   CREATININE 0.95 03/06/2024   BUN 19 03/06/2024   CO2 29 03/06/2024   TSH 1.23 03/06/2024   HGBA1C 5.9 03/06/2024    No results found.  Assessment & Plan:   Problem List Items Addressed This Visit     Gastroesophageal reflux disease - Primary   On Protonix  bid      Relevant Medications   pantoprazole  (PROTONIX ) 40 MG tablet   Other Relevant Orders   Urinalysis   TSH   Comprehensive metabolic panel with GFR   Hypertension   We cont on Amlodipine  2.5 mg/d and Irbesartan  150 mg/d      Relevant Medications   amLODipine   (NORVASC ) 2.5 MG tablet   irbesartan  (AVAPRO ) 150 MG tablet   Other Relevant Orders  Urinalysis   TSH   Comprehensive metabolic panel with GFR   Insomnia   Declined Rx She will try Valerian      Anxiety   Continue on Xanax  prn  Potential benefits of a long term benzodiazepines  use as well as potential risks  and complications were explained to the patient and were aknowledged.      Relevant Medications   ALPRAZolam  (XANAX ) 0.25 MG tablet   Malignant neoplasm of upper-outer quadrant of right breast in female, estrogen receptor positive (HCC)   Dr Lucienne Ryder Dr Maryalice Smaller: "Malignant neoplasm of upper-outer quadrant of right breast in female, estrogen receptor positive (HCC) pT1cN0M0, stage IA, ER+/PR+/HER2-, G1 -Discovered on screening mammogram -Status post lumpectomy, which showed a 1.2 cm invasive ductal carcinoma, with clear margins.  ER 100%, PR 100% positive, HER2 negative. -Given her advanced age and favorable features, adjuvant radiation was not recommended. -I recommend adjuvant antiestrogen tamoxifen  for 5 years." Dr Eloise Hake -XRT x 4 wks  Baby ASA every day if tolerated (fam h/o blood clots?) DVT prevention for travel Vit D qd      Relevant Medications   ALPRAZolam  (XANAX ) 0.25 MG tablet      Meds ordered this encounter  Medications   ALPRAZolam  (XANAX ) 0.25 MG tablet    Sig: TAKE 1 TABLET(0.25 MG) BY MOUTH TWICE DAILY AS NEEDED FOR ANXIETY    Dispense:  180 tablet    Refill:  1   amLODipine  (NORVASC ) 2.5 MG tablet    Sig: Take 1 tablet (2.5 mg total) by mouth daily.    Dispense:  90 tablet    Refill:  3   ipratropium (ATROVENT ) 0.06 % nasal spray    Sig: Place 2 sprays into the nose 3 (three) times daily. Annual appt due must see provider for future refills    Dispense:  45 mL    Refill:  3   irbesartan  (AVAPRO ) 150 MG tablet    Sig: TAKE 1 TABLET(150 MG) BY MOUTH DAILY    Dispense:  90 tablet    Refill:  3   pantoprazole  (PROTONIX ) 40 MG tablet    Sig:  TAKE 1 TABLET(40 MG) BY MOUTH TWICE DAILY    Dispense:  180 tablet    Refill:  3      Follow-up: Return in about 6 months (around 10/09/2024) for a follow-up visit.  Anitra Barn, MD

## 2024-04-08 NOTE — Assessment & Plan Note (Signed)
Declined Rx She will try Valerian

## 2024-04-09 ENCOUNTER — Other Ambulatory Visit: Payer: Self-pay

## 2024-04-09 ENCOUNTER — Ambulatory Visit
Admission: RE | Admit: 2024-04-09 | Discharge: 2024-04-09 | Disposition: A | Discharge status: Home / Self Care | Source: Ambulatory Visit | Attending: Radiation Oncology | Admitting: Radiation Oncology

## 2024-04-09 DIAGNOSIS — Z510 Encounter for antineoplastic radiation therapy: Secondary | ICD-10-CM | POA: Diagnosis not present

## 2024-04-09 DIAGNOSIS — C50411 Malignant neoplasm of upper-outer quadrant of right female breast: Secondary | ICD-10-CM | POA: Diagnosis not present

## 2024-04-09 DIAGNOSIS — Z170 Estrogen receptor positive status [ER+]: Secondary | ICD-10-CM | POA: Diagnosis not present

## 2024-04-09 LAB — RAD ONC ARIA SESSION SUMMARY
Course Elapsed Days: 14
Plan Fractions Treated to Date: 11
Plan Prescribed Dose Per Fraction: 2.67 Gy
Plan Total Fractions Prescribed: 15
Plan Total Prescribed Dose: 40.05 Gy
Reference Point Dosage Given to Date: 29.37 Gy
Reference Point Session Dosage Given: 2.67 Gy
Session Number: 11

## 2024-04-10 ENCOUNTER — Other Ambulatory Visit: Payer: Self-pay

## 2024-04-10 ENCOUNTER — Ambulatory Visit
Admission: RE | Admit: 2024-04-10 | Discharge: 2024-04-10 | Disposition: A | Discharge status: Home / Self Care | Source: Ambulatory Visit | Attending: Radiation Oncology | Admitting: Radiation Oncology

## 2024-04-10 DIAGNOSIS — C50411 Malignant neoplasm of upper-outer quadrant of right female breast: Secondary | ICD-10-CM | POA: Diagnosis not present

## 2024-04-10 DIAGNOSIS — Z510 Encounter for antineoplastic radiation therapy: Secondary | ICD-10-CM | POA: Diagnosis not present

## 2024-04-10 DIAGNOSIS — Z170 Estrogen receptor positive status [ER+]: Secondary | ICD-10-CM | POA: Diagnosis not present

## 2024-04-10 LAB — RAD ONC ARIA SESSION SUMMARY
Course Elapsed Days: 15
Plan Fractions Treated to Date: 12
Plan Prescribed Dose Per Fraction: 2.67 Gy
Plan Total Fractions Prescribed: 15
Plan Total Prescribed Dose: 40.05 Gy
Reference Point Dosage Given to Date: 32.04 Gy
Reference Point Session Dosage Given: 2.67 Gy
Session Number: 12

## 2024-04-11 ENCOUNTER — Ambulatory Visit
Admission: RE | Admit: 2024-04-11 | Discharge: 2024-04-11 | Disposition: A | Discharge status: Home / Self Care | Source: Ambulatory Visit | Attending: Radiation Oncology | Admitting: Radiation Oncology

## 2024-04-11 ENCOUNTER — Other Ambulatory Visit: Payer: Self-pay

## 2024-04-11 DIAGNOSIS — Z510 Encounter for antineoplastic radiation therapy: Secondary | ICD-10-CM | POA: Diagnosis not present

## 2024-04-11 DIAGNOSIS — C50411 Malignant neoplasm of upper-outer quadrant of right female breast: Secondary | ICD-10-CM | POA: Diagnosis not present

## 2024-04-11 DIAGNOSIS — Z170 Estrogen receptor positive status [ER+]: Secondary | ICD-10-CM | POA: Diagnosis not present

## 2024-04-11 LAB — RAD ONC ARIA SESSION SUMMARY
Course Elapsed Days: 16
Plan Fractions Treated to Date: 13
Plan Prescribed Dose Per Fraction: 2.67 Gy
Plan Total Fractions Prescribed: 15
Plan Total Prescribed Dose: 40.05 Gy
Reference Point Dosage Given to Date: 34.71 Gy
Reference Point Session Dosage Given: 2.67 Gy
Session Number: 13

## 2024-04-12 ENCOUNTER — Other Ambulatory Visit: Payer: Self-pay

## 2024-04-12 ENCOUNTER — Ambulatory Visit
Admission: RE | Admit: 2024-04-12 | Discharge: 2024-04-12 | Disposition: A | Discharge status: Home / Self Care | Source: Ambulatory Visit | Attending: Radiation Oncology | Admitting: Radiation Oncology

## 2024-04-12 DIAGNOSIS — Z170 Estrogen receptor positive status [ER+]: Secondary | ICD-10-CM | POA: Diagnosis not present

## 2024-04-12 DIAGNOSIS — C50411 Malignant neoplasm of upper-outer quadrant of right female breast: Secondary | ICD-10-CM | POA: Diagnosis not present

## 2024-04-12 DIAGNOSIS — Z510 Encounter for antineoplastic radiation therapy: Secondary | ICD-10-CM | POA: Diagnosis not present

## 2024-04-12 LAB — RAD ONC ARIA SESSION SUMMARY
Course Elapsed Days: 17
Plan Fractions Treated to Date: 14
Plan Prescribed Dose Per Fraction: 2.67 Gy
Plan Total Fractions Prescribed: 15
Plan Total Prescribed Dose: 40.05 Gy
Reference Point Dosage Given to Date: 37.38 Gy
Reference Point Session Dosage Given: 2.67 Gy
Session Number: 14

## 2024-04-15 ENCOUNTER — Ambulatory Visit

## 2024-04-15 ENCOUNTER — Other Ambulatory Visit: Payer: Self-pay

## 2024-04-15 ENCOUNTER — Ambulatory Visit
Admission: RE | Admit: 2024-04-15 | Discharge: 2024-04-15 | Disposition: A | Discharge status: Home / Self Care | Source: Ambulatory Visit | Attending: Radiation Oncology | Admitting: Radiation Oncology

## 2024-04-15 DIAGNOSIS — Z510 Encounter for antineoplastic radiation therapy: Secondary | ICD-10-CM | POA: Diagnosis not present

## 2024-04-15 DIAGNOSIS — Z170 Estrogen receptor positive status [ER+]: Secondary | ICD-10-CM | POA: Diagnosis not present

## 2024-04-15 DIAGNOSIS — C50411 Malignant neoplasm of upper-outer quadrant of right female breast: Secondary | ICD-10-CM | POA: Diagnosis not present

## 2024-04-15 LAB — RAD ONC ARIA SESSION SUMMARY
Course Elapsed Days: 20
Plan Fractions Treated to Date: 15
Plan Prescribed Dose Per Fraction: 2.67 Gy
Plan Total Fractions Prescribed: 15
Plan Total Prescribed Dose: 40.05 Gy
Reference Point Dosage Given to Date: 40.05 Gy
Reference Point Session Dosage Given: 2.67 Gy
Session Number: 15

## 2024-04-16 ENCOUNTER — Other Ambulatory Visit: Payer: Self-pay

## 2024-04-16 ENCOUNTER — Ambulatory Visit

## 2024-04-16 ENCOUNTER — Ambulatory Visit
Admission: RE | Admit: 2024-04-16 | Discharge: 2024-04-16 | Disposition: A | Discharge status: Home / Self Care | Source: Ambulatory Visit | Attending: Radiation Oncology | Admitting: Radiation Oncology

## 2024-04-16 DIAGNOSIS — C50411 Malignant neoplasm of upper-outer quadrant of right female breast: Secondary | ICD-10-CM | POA: Diagnosis not present

## 2024-04-16 DIAGNOSIS — Z170 Estrogen receptor positive status [ER+]: Secondary | ICD-10-CM | POA: Diagnosis not present

## 2024-04-16 LAB — RAD ONC ARIA SESSION SUMMARY
Course Elapsed Days: 21
Plan Fractions Treated to Date: 1
Plan Prescribed Dose Per Fraction: 2 Gy
Plan Total Fractions Prescribed: 6
Plan Total Prescribed Dose: 12 Gy
Reference Point Dosage Given to Date: 2 Gy
Reference Point Session Dosage Given: 2 Gy
Session Number: 16

## 2024-04-16 MED ORDER — ALRA NON-METALLIC DEODORANT (RAD-ONC)
1.0000 | Freq: Once | TOPICAL | Status: AC
  Administered 2024-04-16: 1 via TOPICAL

## 2024-04-16 MED ORDER — RADIAPLEXRX EX GEL: Freq: Once | CUTANEOUS | Status: AC

## 2024-04-17 ENCOUNTER — Ambulatory Visit

## 2024-04-18 ENCOUNTER — Other Ambulatory Visit: Payer: Self-pay

## 2024-04-18 ENCOUNTER — Ambulatory Visit
Admission: RE | Admit: 2024-04-18 | Discharge: 2024-04-18 | Disposition: A | Discharge status: Home / Self Care | Source: Ambulatory Visit | Attending: Radiation Oncology | Admitting: Radiation Oncology

## 2024-04-18 DIAGNOSIS — Z170 Estrogen receptor positive status [ER+]: Secondary | ICD-10-CM | POA: Diagnosis not present

## 2024-04-18 DIAGNOSIS — C50411 Malignant neoplasm of upper-outer quadrant of right female breast: Secondary | ICD-10-CM | POA: Diagnosis not present

## 2024-04-18 LAB — RAD ONC ARIA SESSION SUMMARY
Course Elapsed Days: 23
Plan Fractions Treated to Date: 2
Plan Prescribed Dose Per Fraction: 2 Gy
Plan Total Fractions Prescribed: 6
Plan Total Prescribed Dose: 12 Gy
Reference Point Dosage Given to Date: 4 Gy
Reference Point Session Dosage Given: 2 Gy
Session Number: 17

## 2024-04-19 ENCOUNTER — Other Ambulatory Visit: Payer: Self-pay

## 2024-04-19 ENCOUNTER — Ambulatory Visit
Admission: RE | Admit: 2024-04-19 | Discharge: 2024-04-19 | Disposition: A | Discharge status: Home / Self Care | Source: Ambulatory Visit | Attending: Radiation Oncology | Admitting: Radiation Oncology

## 2024-04-19 DIAGNOSIS — Z170 Estrogen receptor positive status [ER+]: Secondary | ICD-10-CM | POA: Diagnosis not present

## 2024-04-19 DIAGNOSIS — C50411 Malignant neoplasm of upper-outer quadrant of right female breast: Secondary | ICD-10-CM | POA: Diagnosis not present

## 2024-04-19 LAB — RAD ONC ARIA SESSION SUMMARY
Course Elapsed Days: 24
Plan Fractions Treated to Date: 3
Plan Prescribed Dose Per Fraction: 2 Gy
Plan Total Fractions Prescribed: 6
Plan Total Prescribed Dose: 12 Gy
Reference Point Dosage Given to Date: 6 Gy
Reference Point Session Dosage Given: 2 Gy
Session Number: 18

## 2024-04-22 ENCOUNTER — Ambulatory Visit

## 2024-04-22 ENCOUNTER — Ambulatory Visit
Admission: RE | Admit: 2024-04-22 | Discharge: 2024-04-22 | Disposition: A | Discharge status: Home / Self Care | Source: Ambulatory Visit | Attending: Radiation Oncology | Admitting: Radiation Oncology

## 2024-04-22 ENCOUNTER — Other Ambulatory Visit: Payer: Self-pay

## 2024-04-22 DIAGNOSIS — C50411 Malignant neoplasm of upper-outer quadrant of right female breast: Secondary | ICD-10-CM | POA: Diagnosis not present

## 2024-04-22 DIAGNOSIS — Z170 Estrogen receptor positive status [ER+]: Secondary | ICD-10-CM | POA: Diagnosis not present

## 2024-04-22 LAB — RAD ONC ARIA SESSION SUMMARY
Course Elapsed Days: 27
Plan Fractions Treated to Date: 4
Plan Prescribed Dose Per Fraction: 2 Gy
Plan Total Fractions Prescribed: 6
Plan Total Prescribed Dose: 12 Gy
Reference Point Dosage Given to Date: 8 Gy
Reference Point Session Dosage Given: 2 Gy
Session Number: 19

## 2024-04-23 ENCOUNTER — Ambulatory Visit

## 2024-04-23 ENCOUNTER — Ambulatory Visit
Admission: RE | Admit: 2024-04-23 | Discharge: 2024-04-23 | Disposition: A | Discharge status: Home / Self Care | Source: Ambulatory Visit | Attending: Radiation Oncology | Admitting: Radiation Oncology

## 2024-04-23 ENCOUNTER — Other Ambulatory Visit: Payer: Self-pay

## 2024-04-23 DIAGNOSIS — Z170 Estrogen receptor positive status [ER+]: Secondary | ICD-10-CM | POA: Diagnosis not present

## 2024-04-23 DIAGNOSIS — C50411 Malignant neoplasm of upper-outer quadrant of right female breast: Secondary | ICD-10-CM | POA: Diagnosis not present

## 2024-04-23 DIAGNOSIS — Z510 Encounter for antineoplastic radiation therapy: Secondary | ICD-10-CM | POA: Diagnosis not present

## 2024-04-23 LAB — RAD ONC ARIA SESSION SUMMARY
Course Elapsed Days: 28
Plan Fractions Treated to Date: 5
Plan Prescribed Dose Per Fraction: 2 Gy
Plan Total Fractions Prescribed: 6
Plan Total Prescribed Dose: 12 Gy
Reference Point Dosage Given to Date: 10 Gy
Reference Point Session Dosage Given: 2 Gy
Session Number: 20

## 2024-04-24 ENCOUNTER — Ambulatory Visit

## 2024-04-24 ENCOUNTER — Ambulatory Visit
Admission: RE | Admit: 2024-04-24 | Discharge: 2024-04-24 | Disposition: A | Discharge status: Home / Self Care | Source: Ambulatory Visit | Attending: Radiation Oncology | Admitting: Radiation Oncology

## 2024-04-24 ENCOUNTER — Other Ambulatory Visit: Payer: Self-pay

## 2024-04-24 DIAGNOSIS — Z170 Estrogen receptor positive status [ER+]: Secondary | ICD-10-CM | POA: Diagnosis not present

## 2024-04-24 DIAGNOSIS — C50411 Malignant neoplasm of upper-outer quadrant of right female breast: Secondary | ICD-10-CM | POA: Diagnosis not present

## 2024-04-24 LAB — RAD ONC ARIA SESSION SUMMARY
Course Elapsed Days: 29
Plan Fractions Treated to Date: 6
Plan Prescribed Dose Per Fraction: 2 Gy
Plan Total Fractions Prescribed: 6
Plan Total Prescribed Dose: 12 Gy
Reference Point Dosage Given to Date: 12 Gy
Reference Point Session Dosage Given: 2 Gy
Session Number: 21

## 2024-04-26 NOTE — Radiation Completion Notes (Signed)
  Radiation Oncology         [phone] ________________________________  Name: Diane Sawyer MRN: [id number]  Date of Service: 04/24/2024  DOB: [birth year]  End of Treatment Note  Diagnosis: Stage IA (pT1c, pN0, cM0) Right Breast UOQ, Invasive Ductal Carcinoma Intent: Curative     ==========DELIVERED PLANS==========  First Treatment Date: 2024-03-26 Last Treatment Date: 2024-04-24   Plan Name: Breast_R Site: Breast, Right Technique: 3D Mode: Photon Dose Per Fraction: 2.67 Gy Prescribed Dose (Delivered / Prescribed): 40.05 Gy / 40.05 Gy Prescribed Fxs (Delivered / Prescribed): 15 / 15   Plan Name: Breast_R_Bst Site: Breast, Right Technique: 3D Mode: Photon Dose Per Fraction: 2 Gy Prescribed Dose (Delivered / Prescribed): 12 Gy / 12 Gy Prescribed Fxs (Delivered / Prescribed): 6 / 6     ====================================   The patient tolerated radiation. She developed fatigue and anticipated skin changes in the treatment field.   The patient will return in one month and will continue follow up with Dr. Maryalice Smaller as well.      Amiel Kalata, PA-C

## 2024-04-26 NOTE — Radiation Completion Notes (Incomplete Revision)
  Radiation Oncology         [phone] ________________________________  Name: Diane Sawyer MRN: [id number]  Date of Service: 04/24/2024  DOB: 1946/06/22  End of Treatment Note  Diagnosis: C50.411 Malignant neoplasm of upper-outer quadrant of right female breast Staging on 2024-02-13: Malignant neoplasm of upper-outer quadrant of right breast in female, estrogen receptor positive (HCC) T=pT1c, N=pN0, M=cM0 Staging on 2024-01-22: Malignant neoplasm of upper-outer quadrant of right breast in female, estrogen receptor positive (HCC) T=cT1b, N=cN0, M=cM0 Intent: Curative     ==========DELIVERED PLANS==========  First Treatment Date: 2024-03-26 Last Treatment Date: 2024-04-24   Plan Name: Breast_R Site: Breast, Right Technique: 3D Mode: Photon Dose Per Fraction: 2.67 Gy Prescribed Dose (Delivered / Prescribed): 40.05 Gy / 40.05 Gy Prescribed Fxs (Delivered / Prescribed): 15 / 15   Plan Name: Breast_R_Bst Site: Breast, Right Technique: 3D Mode: Photon Dose Per Fraction: 2 Gy Prescribed Dose (Delivered / Prescribed): 12 Gy / 12 Gy Prescribed Fxs (Delivered / Prescribed): 6 / 6     ====================================   The patient tolerated radiation. {He/she (caps):30048} developed fatigue and anticipated skin changes in the treatment field.   The patient will return in one month and will continue follow up with Dr. Aaron Aas as well.      Amiel Kalata, PA-C

## 2024-05-29 NOTE — Progress Notes (Incomplete)
 Radiation Oncology         [phone] ________________________________  Name: Diane Sawyer MRN: [id number]  Date: 05/30/2024  DOB: 1946-11-25  Follow-Up Visit Note  CC: Plotnikov, Karlynn GAILS, MD  Plotnikov, Karlynn GAILS, MD  No diagnosis found.  Diagnosis:  Stage IA (pT1c, pN0, cM0) Right Breast UOQ, Invasive Ductal Carcinoma   Interval Since Last Radiation:   1 month 5 days Intent: Curative  First Treatment Date: 2024-03-26 Last Treatment Date: 2024-04-24   Plan Name: Breast_R Site: Breast, Right Technique: 3D Mode: Photon Dose Per Fraction: 2.67 Gy Prescribed Dose (Delivered / Prescribed): 40.05 Gy / 40.05 Gy Prescribed Fxs (Delivered / Prescribed): 15 / 15   Plan Name: Breast_R_Bst Site: Breast, Right Technique: 3D Mode: Photon Dose Per Fraction: 2 Gy Prescribed Dose (Delivered / Prescribed): 12 Gy / 12 Gy Prescribed Fxs (Delivered / Prescribed): 6 / 6   Narrative:  The patient returns today for routine follow-up. She was last seen in office on 03/18/24 for her initial consultation. Patient continued to follow up with their specialists to manage their chronic conditions.    In the interval since she was last seen, patient completed her radiation treatment which she tolerated quite well. Patient did however endorse experiencing fatigue and anticipated skin changes in the treatment field.   No other significant oncologic interval history since the patient was last seen.                                 Allergies:  is allergic to benazepril hcl, levofloxacin, and mobic [meloxicam].  Meds: Current Outpatient Medications  Medication Sig Dispense Refill   acetaminophen  (TYLENOL ) 500 MG tablet Take 1,000 mg by mouth every 6 (six) hours as needed for mild pain.     ALPRAZolam  (XANAX ) 0.25 MG tablet TAKE 1 TABLET(0.25 MG) BY MOUTH TWICE DAILY AS NEEDED FOR ANXIETY 180 tablet 1   amLODipine  (NORVASC ) 2.5 MG tablet Take 1 tablet (2.5 mg total) by mouth daily. 90 tablet 3    aspirin  EC 81 MG tablet Take 1 tablet (81 mg total) by mouth daily.     cefUROXime  (CEFTIN ) 250 MG tablet Take 1 tablet (250 mg total) by mouth 2 (two) times daily with a meal. 10 tablet 0   dorzolamide-timolol (COSOPT) 22.3-6.8 MG/ML ophthalmic solution 1 drop 2 (two) times daily.     estradiol  (ESTRACE ) 0.1 MG/GM vaginal cream Place 1 g vaginally 2 (two) times a week. 42.5 g 2   fexofenadine (ALLEGRA) 180 MG tablet Take 180 mg by mouth daily.     ipratropium (ATROVENT ) 0.06 % nasal spray Place 2 sprays into the nose 3 (three) times daily. Annual appt due must see provider for future refills 45 mL 3   irbesartan  (AVAPRO ) 150 MG tablet TAKE 1 TABLET(150 MG) BY MOUTH DAILY 90 tablet 3   IRON PO Take 65 mg by mouth daily.     latanoprost (XALATAN) 0.005 % ophthalmic solution INT 1 GTT IN OU HS     methocarbamol  (ROBAXIN ) 500 MG tablet Take 500 mg by mouth. (Patient not taking: Reported on 04/08/2024)     pantoprazole  (PROTONIX ) 40 MG tablet TAKE 1 TABLET(40 MG) BY MOUTH TWICE DAILY 180 tablet 3   tamoxifen  (NOLVADEX ) 20 MG tablet Take 1 tablet (20 mg total) by mouth daily. (Patient not taking: Reported on 04/08/2024) 30 tablet 3   traMADol  (ULTRAM ) 50 MG tablet Take 1 tablet (50 mg total)  by mouth every 6 (six) hours as needed for moderate pain (pain score 4-6) or severe pain (pain score 7-10). (Patient not taking: Reported on 03/18/2024) 20 tablet 0   VITAMIN D  PO Take 1,000 Units by mouth daily.     No current facility-administered medications for this encounter.    Physical Findings: The patient is in no acute distress. Patient is alert and oriented.  vitals were not taken for this visit. .  No significant changes. Lungs are clear to auscultation bilaterally. Heart has regular rate and rhythm. No palpable cervical, supraclavicular, or axillary adenopathy. Abdomen soft, non-tender, normal bowel sounds.   Lab Findings: Lab Results  Component Value Date   WBC 6.2 03/06/2024   HGB 13.8 03/06/2024    HCT 41.0 03/06/2024   MCV 91.4 03/06/2024   PLT 232.0 03/06/2024    Radiographic Findings: No results found.  Impression: Stage IA (pT1c, pN0, cM0) Right Breast UOQ, Invasive Ductal Carcinoma   The patient is recovering from the effects of radiation.  ***  Plan:  ***   *** minutes of total time was spent for this patient encounter, including preparation, face-to-face counseling with the patient and coordination of care, physical exam, and documentation of the encounter. ____________________________________  Lynwood CHARM Nasuti, PhD, MD  This document serves as a record of services personally performed by Lynwood Nasuti, MD. It was created on his behalf by Reymundo Cartwright, a trained medical scribe. The creation of this record is based on the scribe's personal observations and the provider's statements to them. This document has been checked and approved by the attending provider.

## 2024-05-30 ENCOUNTER — Encounter: Payer: Self-pay | Admitting: Radiation Oncology

## 2024-05-30 ENCOUNTER — Ambulatory Visit
Admission: RE | Admit: 2024-05-30 | Discharge: 2024-05-30 | Disposition: A | Discharge status: Home / Self Care | Source: Ambulatory Visit | Attending: Radiation Oncology | Admitting: Radiation Oncology

## 2024-05-30 VITALS — BP 149/85 | HR 78 | Temp 97.1°F | Resp 18 | Ht 63.0 in | Wt 196.5 lb

## 2024-05-30 DIAGNOSIS — Z170 Estrogen receptor positive status [ER+]: Secondary | ICD-10-CM

## 2024-05-30 NOTE — Progress Notes (Addendum)
 Diane Sawyer is here today for follow up post radiation to the breast.   Breast Side:Right Side   They completed their radiation on: 2024-04-24   Does the patient complain of any of the following:  Post radiation skin issues: She reports a rash and some peeling to treatment area but healing well. Breast Tenderness: Denies Breast Swelling: Denies Lymphadema: Denies Range of Motion limitations: Denies Fatigue post radiation: Yes, mild fatigue. Appetite good/fair/poor: Good    Comments or additional comments: She has questions about when should she start taking her Tamoxifen  and continue her vaginal cream (Estrace ). She has been advised to call back with any questions or concerns. It was a privilege taking part in this patient's care.   BP (!) 149/85 (BP Location: Left Arm, Patient Position: Sitting)   Pulse 78   Temp (!) 97.1 F (36.2 C) (Temporal)   Resp 18   Ht 5' 3 (1.6 m)   Wt 196 lb 8 oz (89.1 kg)   SpO2 100%   BMI 34.81 kg/m

## 2024-06-02 NOTE — Progress Notes (Unsigned)
 CLINIC:  Survivorship   REASON FOR VISIT:  Routine follow-up post-treatment for a recent history of breast cancer.  BRIEF ONCOLOGIC HISTORY:  Oncology History  Malignant neoplasm of upper-outer quadrant of right breast in female, estrogen receptor positive (HCC)  01/22/2024 Cancer Staging   Staging form: Breast, AJCC 8th Edition - Clinical stage from 01/22/2024: Stage IA (cT1b, cN0, cM0, G1, ER+, PR+, HER2-) - Signed by Lanny Callander, MD on 01/30/2024 Stage prefix: Initial diagnosis Histologic grading system: 3 grade system   01/29/2024 Initial Diagnosis   Malignant neoplasm of upper-outer quadrant of right breast in female, estrogen receptor positive (HCC)   02/13/2024 Cancer Staging   Staging form: Breast, AJCC 8th Edition - Pathologic stage from 02/13/2024: Stage IA (pT1c, pN0, cM0, G1, ER+, PR+, HER2-) - Signed by Lanny Callander, MD on 02/27/2024 Stage prefix: Initial diagnosis Histologic grading system: 3 grade system Residual tumor (R): R0   02/16/2024 Genetic Testing   Negative genetic testing on the CancerNext-Expanded+RNAinsight panel.  POLE VUS identified.  The report date is February 16, 2024.  The CancerNext-Expanded gene panel offered by [facility] and includes sequencing, rearrangement, and RNA analysis for the following 76 genes: AIP, ALK, APC, ATM, AXIN2, BAP1, BARD1, BMPR1A, BRCA1, BRCA2, BRIP1, CDC73, CDH1, CDK4, CDKN1B, CDKN2A, CEBPA, CHEK2, CTNNA1, DDX41, DICER1, ETV6, FH, FLCN, GATA2, LZTR1, MAX, MBD4, MEN1, MET, MLH1, MSH2, MSH3, MSH6, MUTYH, NF1, NF2, NTHL1, PALB2, PHOX2B, PMS2, POT1, PRKAR1A, PTCH1, PTEN, RAD51C, RAD51D, RB1, RET, RUNX1, SDHA, SDHAF2, SDHB, SDHC, SDHD, SMAD4, SMARCA4, SMARCB1, SMARCE1, STK11, SUFU, TMEM127, TP53, TSC1, TSC2, VHL, and WT1 (sequencing and deletion/duplication); EGFR, HOXB13, KIT, MITF, PDGFRA, POLD1, and POLE (sequencing only); EPCAM and GREM1 (deletion/duplication only).     03/26/2024 - 04/24/2024 Radiation Therapy   Intent: Curative  First  Treatment Date: 2024-03-26 Last Treatment Date: 2024-04-24   Plan Name: Breast_R Site: Breast, Right Technique: 3D Mode: Photon Dose Per Fraction: 2.67 Gy Prescribed Dose (Delivered / Prescribed): 40.05 Gy / 40.05 Gy Prescribed Fxs (Delivered / Prescribed): 15 / 15   Plan Name: Breast_R_Bst Site: Breast, Right Technique: 3D Mode: Photon Dose Per Fraction: 2 Gy Prescribed Dose (Delivered / Prescribed): 12 Gy / 12 Gy Prescribed Fxs (Delivered / Prescribed): 6 / 6     INTERVAL HISTORY:  Ms. Avis presents to the Survivorship Clinic today for our initial meeting to review her survivorship care plan detailing her treatment course for breast cancer, as well as monitoring long-term side effects of that treatment, education regarding health maintenance, screening, and overall wellness and health promotion.     Overall, Ms. Hirota reports feeling quite well since completing her radiation therapy approximately 2 months ago.  She is feeling well overall.  She did undergo radiation therapy from 03/26/2024 through 04/24/2024.  She has experienced some fatigue.  Seem to have gotten little worse since ending radiation.  States that it does come and go.  She has seen her GYN.  Has history of prolapsed bladder and has a pessary.  Prior to breast cancer diagnosis, was using vaginal estrogen cream.  GYN providers suggested she speak with oncology before restarting this.  For now, they recommend against using the estrogen cream.  Asked that she discuss with her GYN provider to see if there is different cream that can be used that is hormone free.  The patient states she has GYN appointment in the next month or 2.  Patient also mentions she has started having intermittent diarrhea radiation.  States that at the beginning of the  week, this is not a problem.  As the week progresses, she gradually develops several episodes of diarrhea each day.  Takes Imodium to help manage the symptoms.  She feels like this is her  body's way of managing the effects of radiation.  Symptoms are not intrusive.  She denies chest pain, chest pressure, or shortness of breath. She denies headaches or visual disturbances. She denies abdominal pain, nausea, vomiting, or other changes in bowel or bladder habits.      REVIEW OF SYSTEMS:  Review of Systems  Constitutional:  Positive for malaise/fatigue. Negative for chills and fever.  HENT:  Negative for congestion and sinus pain.   Eyes: Negative.   Respiratory:  Negative for cough and wheezing.   Cardiovascular:  Negative for chest pain, palpitations, claudication and leg swelling.  Gastrointestinal:  Positive for diarrhea. Negative for constipation.  Genitourinary:  Positive for frequency and urgency.       Has history of  bladder prolapse for which she uses a pessary ring.  Musculoskeletal:  Negative for back pain, joint pain and myalgias.  Skin:  Negative for itching and rash.  Neurological:  Negative for dizziness, weakness and headaches.  Psychiatric/Behavioral:  Negative for depression. The patient is not nervous/anxious.   All other systems reviewed and are negative.   Breast: Denies any new nodularity, masses, tenderness, nipple changes, or nipple discharge.      ONCOLOGY TREATMENT TEAM:  1. Surgeon:  Dr. Vernetta at [facility] 2. Medical Oncologist: Dr. Lanny  3. Radiation Oncologist: Dr. Shannon    PAST MEDICAL/SURGICAL HISTORY:  Past Medical History:  Diagnosis Date   Adenomatous polyp of colon 2004   Allergic rhinitis    Anemia    Anxiety    Back pain    Bladder prolapse, female, acquired    Breast cancer (HCC)    Cataract 2018   Diverticulosis    GERD (gastroesophageal reflux disease)    Glaucoma    Hypertension    Lumbar compression fracture (HCC) 09/07/2016   x 2    Menopause    Urinary incontinence    Vitamin D  deficiency    Past Surgical History:  Procedure Laterality Date   BREAST BIOPSY Right 01/22/2024   US  RT BREAST  BX W LOC DEV 1ST LESION IMG BX SPEC US  GUIDE 01/22/2024 [facility] M[facility]MO[facility]   BREAST BIOPSY  02/12/2024   MM RT RADIOACTIVE SEED LOC MAMMO GUIDE 02/12/2024 [facility] MAMMOGRAP[facility]   BREAST LUMPECTOMY WITH RADIOACTIVE SEED LOCALIZATION Right 02/13/2024   Procedure: RADIOACTIVE SEED GUIDED RIGHT BREAST LUMPECTOMY;  Surgeon: Vernetta Berg, MD;  Location: [facility];  Service: General;  Laterality: Right;   CATARACT EXTRACTION, BILATERAL  04/06/2017   with cypass stent implanted   COLONOSCOPY     cypess stent left eye Left 2018   POLYPECTOMY       ALLERGIES:  Allergies  Allergen Reactions   Benazepril Hcl     REACTION: achy- makes her feel strange.   Levofloxacin Nausea Only   Mobic [Meloxicam] Other (See Comments)    Felt like chest going to explode     CURRENT MEDICATIONS:  Outpatient Encounter Medications as of 06/03/2024  Medication Sig   acetaminophen  (TYLENOL ) 500 MG tablet Take 1,000 mg by mouth every 6 (six) hours as needed for mild pain.   ALPRAZolam  (XANAX ) 0.25 MG tablet TAKE 1 TABLET(0.25 MG) BY MOUTH TWICE DAILY AS NEEDED FOR ANXIETY   amLODipine  (NORVASC ) 2.5 MG tablet Take 1 tablet (2.5 mg total)  by mouth daily.   aspirin  EC 81 MG tablet Take 1 tablet (81 mg total) by mouth daily.   cefUROXime  (CEFTIN ) 250 MG tablet Take 1 tablet (250 mg total) by mouth 2 (two) times daily with a meal.   dorzolamide-timolol (COSOPT) 22.3-6.8 MG/ML ophthalmic solution 1 drop 2 (two) times daily.   estradiol  (ESTRACE ) 0.1 MG/GM vaginal cream Place 1 g vaginally 2 (two) times a week.   fexofenadine (ALLEGRA) 180 MG tablet Take 180 mg by mouth daily.   ipratropium (ATROVENT ) 0.06 % nasal spray Place 2 sprays into the nose 3 (three) times daily. Annual appt due must see provider for future refills   irbesartan  (AVAPRO ) 150 MG tablet TAKE 1 TABLET(150 MG) BY MOUTH DAILY   IRON PO Take 65 mg by mouth daily.   latanoprost (XALATAN) 0.005 % ophthalmic solution INT 1 GTT IN OU HS    methocarbamol  (ROBAXIN ) 500 MG tablet Take 500 mg by mouth.   pantoprazole  (PROTONIX ) 40 MG tablet TAKE 1 TABLET(40 MG) BY MOUTH TWICE DAILY   tamoxifen  (NOLVADEX ) 20 MG tablet Take 1 tablet (20 mg total) by mouth daily.   traMADol  (ULTRAM ) 50 MG tablet Take 1 tablet (50 mg total) by mouth every 6 (six) hours as needed for moderate pain (pain score 4-6) or severe pain (pain score 7-10).   VITAMIN D  PO Take 1,000 Units by mouth daily.   No facility-administered encounter medications on file as of 06/03/2024.     ONCOLOGIC FAMILY HISTORY:  Family History  Problem Relation Age of Onset   Stroke Mother 24   Hypertension Mother    Sudden death Mother    Anxiety disorder Mother    Lung cancer Father 15       smoker   Brain cancer Paternal Uncle        benign   Colon cancer Neg Hx    Esophageal cancer Neg Hx    Stomach cancer Neg Hx    Rectal cancer Neg Hx      GENETIC COUNSELING/TESTING: Performed on 02/16/2024 Negative genetic testing on the CancerNext-Expanded+RNAinsight panel.  POLE VUS identified.  The report date is February 16, 2024. The CancerNext-Expanded gene panel offered by [facility] and includes sequencing, rearrangement, and RNA analysis for the following 76 genes: AIP, ALK, APC, ATM, AXIN2, BAP1, BARD1, BMPR1A, BRCA1, BRCA2, BRIP1, CDC73, CDH1, CDK4, CDKN1B, CDKN2A, CEBPA, CHEK2, CTNNA1, DDX41, DICER1, ETV6, FH, FLCN, GATA2, LZTR1, MAX, MBD4, MEN1, MET, MLH1, MSH2, MSH3, MSH6, MUTYH, NF1, NF2, NTHL1, PALB2, PHOX2B, PMS2, POT1, PRKAR1A, PTCH1, PTEN, RAD51C, RAD51D, RB1, RET, RUNX1, SDHA, SDHAF2, SDHB, SDHC, SDHD, SMAD4, SMARCA4, SMARCB1, SMARCE1, STK11, SUFU, TMEM127, TP53, TSC1, TSC2, VHL, and WT1 (sequencing and deletion/duplication); EGFR, HOXB13, KIT, MITF, PDGFRA, POLD1, and POLE (sequencing only); EPCAM and GREM1 (deletion/duplication only).    PHYSICAL EXAMINATION:  Vital Signs:   Vitals:   06/03/24 1103  BP: 138/80  Pulse: 79  Resp: 17  Temp: 97.7 F (36.5 C)   SpO2: 98%   Filed Weights   06/03/24 1103  Weight: 194 lb 1.6 oz (88 kg)   General: Well-nourished, well-appearing female in no acute distress.  She is unaccompanied today.   HEENT: Head is normocephalic.  Pupils equal and reactive to light. Conjunctivae clear without exudate.  Sclerae anicteric. Oral mucosa is pink, moist.  Oropharynx is pink without lesions or erythema.  Lymph: No cervical, supraclavicular, or infraclavicular lymphadenopathy noted on palpation.  Cardiovascular: Regular rate and rhythm.SABRA Respiratory: Clear to auscultation bilaterally. Chest expansion symmetric; breathing  non-labored.  GI: Abdomen soft and round; non-tender, non-distended. Bowel sounds normoactive.  GU: Deferred.  Neuro: No focal deficits. Steady gait.  Psych: Mood and affect normal and appropriate for situation.  Extremities: No edema. MSK: No focal spinal tenderness to palpation.  Full range of motion in bilateral upper extremities Skin: Warm and dry. Breast: She has well-healed surgical lumpectomy scar across upper outer quadrant of right breast.  There are no palpable masses or lumps.  There is no nipple inversion or nipple discharge.  There is no axillary lymphadenopathy on the right.  Left breast has no palpable masses or lumps.  There is no nipple inversion or nipple discharge.  There is no axillary lymphadenopathy on the left.  LABORATORY DATA:  None for this visit.  DIAGNOSTIC IMAGING:  None for this visit.     ASSESSMENT AND PLAN:  Ms.. Siever is a pleasant 78 y.o. female with Stage 1A right breast invasive ductal carcinoma, ER+/PR+/HER2-, diagnosed in January 2025, treated with lumpectomy, adjuvant radiation therapy, and anti-estrogen therapy with tamoxifen  beginning in 06/04/2024.  She presents to the Survivorship Clinic for our initial meeting and routine follow-up post-completion of treatment for breast cancer.    1. Stage 1A right breast cancer:  Ms. Costabile is continuing to recover from  definitive treatment for breast cancer. She will follow-up with her medical oncologist, Dr. Lanny in September 2025 with history and physical exam per surveillance protocol.  She will continue her anti-estrogen therapy with tamoxifen . She will start Tamoxifen  06/04/2024. She was instructed to make Dr. Lanny or myself aware if she begins to experience any side effects of the medication and I could see her back in clinic to help manage those side effects, as needed. Though the incidence is low, there is an associated risk of endometrial cancer with anti-estrogen therapies like Tamoxifen .  Ms. Cobarrubias was encouraged to contact Dr. Lanny or myself with any vaginal bleeding while taking Tamoxifen . Other side effects of Tamoxifen  were again reviewed with her as well. Today, a comprehensive survivorship care plan and treatment summary was reviewed with the patient today detailing her breast cancer diagnosis, treatment course, potential late/long-term effects of treatment, appropriate follow-up care with recommendations for the future, and patient education resources.  A copy of this summary, along with a letter will be sent to the patient's primary care provider via mail/fax/In Basket message after today's visit.    2. Bone health:  Given Ms. Andersson's age/history of breast cancer she had a bone density test at the same time as she had her most recent screening mammogram. Her last DEXA scan was 01/01/2024, which showed osteopenia/low bone mass, with significant change in the left femur neck. In the meantime, she was encouraged to increase her consumption of foods rich in calcium, as well as increase her weight-bearing activities.  She was given education on specific activities to promote bone health. Tamoxifen  was chosen for anti-estrogen therapy as it may help to improve her overall bone mineralization.   3. Cancer screening:  Due to Ms. Yeaman's history and her age, she should receive screening for skin cancers, colon cancer, and  gynecologic cancers.  The information and recommendations are listed on the patient's comprehensive care plan/treatment summary and were reviewed in detail with the patient.    4. Health maintenance and wellness promotion: Ms. Yost was encouraged to consume 5-7 servings of fruits and vegetables per day. We reviewed the Nutrition [employer] handout, as well as the handout Take Control of Your Health and Reduce  Your Cancer Risk from the [employer].  She was also encouraged to engage in moderate to vigorous exercise for 30 minutes per day most days of the week. We discussed the [employer] fitness program, which is designed for cancer survivors to help them become more physically fit after cancer treatments.  She was instructed to limit her alcohol consumption and continue to abstain from tobacco use.      5. Support services/counseling: It is not uncommon for this period of the patient's cancer care trajectory to be one of many emotions and stressors.  We discussed an opportunity for her to participate in the next session of FYNN (Finding Your New Normal) support group series designed for patients after they have completed treatment.   Ms. Figuereo was encouraged to take advantage of our many other support services programs, support groups, and/or counseling in coping with her new life as a cancer survivor after completing anti-cancer treatment.  She was offered support today through active listening and expressive supportive counseling.  She was given information regarding our available services and encouraged to contact me with any questions or for help enrolling in any of our support group/programs.    Dispo:   -Return to cancer center 09/02/2024 with Dr. Lanny  -Mammogram due in 02/2025 -Follow up with surgery as scheduled.  -She is welcome to return back to the Survivorship Clinic at any time; no additional follow-up needed at this time.  -Consider referral back to survivorship as a  long-term survivor for continued surveillance  A total of (30) minutes of face-to-face time was spent with this patient with greater than 50% of that time in counseling and care-coordination.   Powell Lessen, NP Survivorship Program [facility] [phone]   Note: PRIMARY CARE PROVIDER Garald Karlynn GAILS, MD [phone]

## 2024-06-03 ENCOUNTER — Encounter: Payer: Self-pay | Admitting: Nurse Practitioner

## 2024-06-03 ENCOUNTER — Inpatient Hospital Stay: Attending: Nurse Practitioner | Admitting: Nurse Practitioner

## 2024-06-03 VITALS — BP 138/80 | HR 79 | Temp 97.7°F | Resp 17 | Wt 194.1 lb

## 2024-06-03 DIAGNOSIS — R3915 Urgency of urination: Secondary | ICD-10-CM | POA: Diagnosis not present

## 2024-06-03 DIAGNOSIS — Z801 Family history of malignant neoplasm of trachea, bronchus and lung: Secondary | ICD-10-CM | POA: Diagnosis not present

## 2024-06-03 DIAGNOSIS — Z1721 Progesterone receptor positive status: Secondary | ICD-10-CM | POA: Diagnosis not present

## 2024-06-03 DIAGNOSIS — Z823 Family history of stroke: Secondary | ICD-10-CM | POA: Diagnosis not present

## 2024-06-03 DIAGNOSIS — C50411 Malignant neoplasm of upper-outer quadrant of right female breast: Secondary | ICD-10-CM | POA: Diagnosis present

## 2024-06-03 DIAGNOSIS — Z808 Family history of malignant neoplasm of other organs or systems: Secondary | ICD-10-CM | POA: Insufficient documentation

## 2024-06-03 DIAGNOSIS — Z860101 Personal history of adenomatous and serrated colon polyps: Secondary | ICD-10-CM | POA: Insufficient documentation

## 2024-06-03 DIAGNOSIS — Z888 Allergy status to other drugs, medicaments and biological substances status: Secondary | ICD-10-CM | POA: Diagnosis not present

## 2024-06-03 DIAGNOSIS — Z1732 Human epidermal growth factor receptor 2 negative status: Secondary | ICD-10-CM | POA: Diagnosis not present

## 2024-06-03 DIAGNOSIS — Z170 Estrogen receptor positive status [ER+]: Secondary | ICD-10-CM | POA: Insufficient documentation

## 2024-06-03 DIAGNOSIS — R5383 Other fatigue: Secondary | ICD-10-CM | POA: Insufficient documentation

## 2024-06-03 DIAGNOSIS — Z881 Allergy status to other antibiotic agents status: Secondary | ICD-10-CM | POA: Diagnosis not present

## 2024-06-03 DIAGNOSIS — R197 Diarrhea, unspecified: Secondary | ICD-10-CM | POA: Insufficient documentation

## 2024-06-03 DIAGNOSIS — Z8249 Family history of ischemic heart disease and other diseases of the circulatory system: Secondary | ICD-10-CM | POA: Diagnosis not present

## 2024-06-03 DIAGNOSIS — M85852 Other specified disorders of bone density and structure, left thigh: Secondary | ICD-10-CM | POA: Diagnosis not present

## 2024-06-03 DIAGNOSIS — R350 Frequency of micturition: Secondary | ICD-10-CM | POA: Insufficient documentation

## 2024-06-03 DIAGNOSIS — I10 Essential (primary) hypertension: Secondary | ICD-10-CM | POA: Insufficient documentation

## 2024-06-03 DIAGNOSIS — Z79899 Other long term (current) drug therapy: Secondary | ICD-10-CM | POA: Diagnosis not present

## 2024-06-03 DIAGNOSIS — F419 Anxiety disorder, unspecified: Secondary | ICD-10-CM | POA: Insufficient documentation

## 2024-06-03 NOTE — Assessment & Plan Note (Addendum)
 pT1cN0M0, stage IA, ER+/PR+/HER2-, G1 -Discovered on screening mammogram -Status post lumpectomy, which showed a 1.2 cm invasive ductal carcinoma, with clear margins.  ER 100%, PR 100% positive, HER2 negative. -Given her advanced age and favorable features, adjuvant radiation was not recommended. -Dr. Vernetta did recommend the patient have radiation therapy. She started 03/26/2024 and completed treatment 04/24/2024. She has not yet started tamoxifen .  -plan to start tamoxifen  20 mg daily 06/04/2024. The patient has spoken with her prmary care provider about increased risk of DVT while taking tamoxifen . As preventive, her primary care has suggested she start aspirin  81 mg daily. The patient has started this already.

## 2024-06-05 ENCOUNTER — Ambulatory Visit
Admission: RE | Admit: 2024-06-05 | Discharge: 2024-06-05 | Disposition: A | Discharge status: Home / Self Care | Source: Ambulatory Visit | Attending: Family Medicine | Admitting: Family Medicine

## 2024-06-05 VITALS — BP 154/84 | HR 94 | Temp 98.0°F | Resp 17

## 2024-06-05 DIAGNOSIS — N3000 Acute cystitis without hematuria: Secondary | ICD-10-CM | POA: Diagnosis not present

## 2024-06-05 DIAGNOSIS — R350 Frequency of micturition: Secondary | ICD-10-CM

## 2024-06-05 LAB — POCT URINALYSIS DIP (MANUAL ENTRY)
Bilirubin, UA: NEGATIVE
Blood, UA: NEGATIVE
Glucose, UA: NEGATIVE mg/dL
Ketones, POC UA: NEGATIVE mg/dL
Nitrite, UA: NEGATIVE
Protein Ur, POC: NEGATIVE mg/dL
Spec Grav, UA: 1.025 (ref 1.010–1.025)
Urobilinogen, UA: 0.2 U/dL
pH, UA: 5.5 (ref 5.0–8.0)

## 2024-06-05 MED ORDER — CEFDINIR 300 MG PO CAPS: 300.0000 mg | ORAL_CAPSULE | Freq: Two times a day (BID) | ORAL | 0 refills | Status: AC

## 2024-06-05 NOTE — Discharge Instructions (Addendum)
 Advised patient take medication as directed with food to completion. Encouraged increase daily water intake to 32 to 64 ounces per day while taking this medication.  Advised we will follow-up with urine culture results once received.  Advised if symptoms worsen and/or unresolved please follow-up with your PCP, [facility] urology, or here for further evaluation.

## 2024-06-05 NOTE — ED Provider Notes (Signed)
 [facility]    CSN: [id number] Arrival date & time: 06/05/24  1705      History   Chief Complaint Chief Complaint  Patient presents with   Urinary Frequency    Suspected urinary tract infection     HPI Diane Sawyer is a 78 y.o. female.   HPI Very pleasant 78 year old female presents with urinary frequency for 3 days.  PMH significant for breast cancer, cataract, and HTN.  Past Medical History:  Diagnosis Date   Adenomatous polyp of colon 2004   Allergic rhinitis    Anemia    Anxiety    Back pain    Bladder prolapse, female, acquired    Breast cancer (HCC)    Cataract 2018   Diverticulosis    GERD (gastroesophageal reflux disease)    Glaucoma    Hypertension    Lumbar compression fracture (HCC) 09/07/2016   x 2    Menopause    Urinary incontinence    Vitamin D  deficiency     Patient Active Problem List   Diagnosis Date Noted   Genetic testing 02/19/2024   Malignant neoplasm of upper-outer quadrant of right breast in female, estrogen receptor positive (HCC) 01/29/2024   Uterine prolapse 07/07/2021   Edema 04/20/2020   Cough 01/13/2020   History of COVID-19 12/25/2019   Syncope and collapse 12/25/2019   Dysuria 04/25/2019   Insulin  resistance 01/14/2019   Prediabetes 01/08/2018   Other fatigue 12/25/2017   Shortness of breath on exertion 12/25/2017   Hyperglycemia 12/25/2017   Class 1 obesity with serious comorbidity and body mass index (BMI) of 32.0 to 32.9 in adult 12/25/2017   Low back pain 11/21/2016   Rash and nonspecific skin eruption 11/10/2014   Left shoulder pain 11/18/2013   Nonspecific abnormal electrocardiogram (ECG) (EKG) 11/18/2013   Dizziness 11/18/2013   Well adult exam 11/12/2012   Anxiety 11/12/2012   Hypertension 11/07/2011   Insomnia 11/07/2011   PHARYNGITIS 08/31/2009   Vitamin D  deficiency 09/01/2008   ANEMIA-IRON DEFICIENCY 09/01/2008   Allergic rhinitis 09/01/2008   Gastroesophageal reflux disease 09/01/2008    ADENOMATOUS COLONIC POLYP 12/05/2007   HEMORRHOIDS, INTERNAL 12/05/2007   Hiatal hernia 12/05/2007   DIVERTICULOSIS, COLON 12/05/2007   Osteopenia 12/05/2007    Past Surgical History:  Procedure Laterality Date   BREAST BIOPSY Right 01/22/2024   US  RT BREAST BX W LOC DEV 1ST LESION IMG BX SPEC US  GUIDE 01/22/2024 [facility]   BREAST BIOPSY  02/12/2024   MM RT RADIOACTIVE SEED LOC MAMMO GUIDE 02/12/2024 [facility]   BREAST LUMPECTOMY WITH RADIOACTIVE SEED LOCALIZATION Right 02/13/2024   Procedure: RADIOACTIVE SEED GUIDED RIGHT BREAST LUMPECTOMY;  Surgeon: Vernetta Berg, MD;  Location: [facility];  Service: General;  Laterality: Right;   CATARACT EXTRACTION, BILATERAL  04/06/2017   with cypass stent implanted   COLONOSCOPY     cypess stent left eye Left 2018   POLYPECTOMY      OB History     Gravida  2   Para  2   Term  2   Preterm      AB      Living  2      SAB      IAB      Ectopic      Multiple      Live Births  2            Home Medications    Prior to Admission medications   Medication Sig  Start Date End Date Taking? Authorizing Provider  cefdinir (OMNICEF) 300 MG capsule Take 1 capsule (300 mg total) by mouth 2 (two) times daily for 7 days. 06/05/24 06/12/24 Yes Teddy Sharper, FNP  acetaminophen  (TYLENOL ) 500 MG tablet Take 1,000 mg by mouth every 6 (six) hours as needed for mild pain.    [provider]  ALPRAZolam  (XANAX ) 0.25 MG tablet TAKE 1 TABLET(0.25 MG) BY MOUTH TWICE DAILY AS NEEDED FOR ANXIETY 04/08/24   Plotnikov, Karlynn GAILS, MD  amLODipine  (NORVASC ) 2.5 MG tablet Take 1 tablet (2.5 mg total) by mouth daily. 04/08/24   Plotnikov, Karlynn GAILS, MD  aspirin  EC 81 MG tablet Take 1 tablet (81 mg total) by mouth daily. 03/06/24 03/06/25  Plotnikov, Aleksei V, MD  dorzolamide-timolol (COSOPT) 22.3-6.8 MG/ML ophthalmic solution 1 drop 2 (two) times daily. 08/28/22   [provider]  estradiol  (ESTRACE ) 0.1 MG/GM  vaginal cream Place 1 g vaginally 2 (two) times a week. 10/02/23   Prentiss Annabella LABOR, NP  fexofenadine (ALLEGRA) 180 MG tablet Take 180 mg by mouth daily.    [provider]  ipratropium (ATROVENT ) 0.06 % nasal spray Place 2 sprays into the nose 3 (three) times daily. Annual appt due must see provider for future refills 04/08/24   Plotnikov, Karlynn GAILS, MD  irbesartan  (AVAPRO ) 150 MG tablet TAKE 1 TABLET(150 MG) BY MOUTH DAILY 04/08/24   Plotnikov, Aleksei V, MD  IRON PO Take 65 mg by mouth daily.    [provider]  latanoprost (XALATAN) 0.005 % ophthalmic solution INT 1 GTT IN OU HS 06/04/19   [provider]  methocarbamol  (ROBAXIN ) 500 MG tablet Take 500 mg by mouth.    [provider]  pantoprazole  (PROTONIX ) 40 MG tablet TAKE 1 TABLET(40 MG) BY MOUTH TWICE DAILY 04/08/24   Plotnikov, Aleksei V, MD  tamoxifen  (NOLVADEX ) 20 MG tablet Take 1 tablet (20 mg total) by mouth daily. 02/27/24   Lanny Callander, MD  traMADol  (ULTRAM ) 50 MG tablet Take 1 tablet (50 mg total) by mouth every 6 (six) hours as needed for moderate pain (pain score 4-6) or severe pain (pain score 7-10). 02/13/24   Vernetta Berg, MD  VITAMIN D  PO Take 1,000 Units by mouth daily.    [provider]    Family History Family History  Problem Relation Age of Onset   Stroke Mother 82   Hypertension Mother    Sudden death Mother    Anxiety disorder Mother    Lung cancer Father 24       smoker   Brain cancer Paternal Uncle        benign   Colon cancer Neg Hx    Esophageal cancer Neg Hx    Stomach cancer Neg Hx    Rectal cancer Neg Hx     Social History Social History   Tobacco Use   Smoking status: Never   Smokeless tobacco: Never  Vaping Use   Vaping status: Never Used  Substance Use Topics   Alcohol use: No   Drug use: No     Allergies   Benazepril hcl, Levofloxacin, and Mobic [meloxicam]   Review of Systems Review of Systems  Genitourinary:  Positive for frequency.   All other systems reviewed and are negative.    Physical Exam Triage Vital Signs ED Triage Vitals  Encounter Vitals Group     BP 06/05/24 1723 (!) 154/84     Girls Systolic BP Percentile --      Girls Diastolic  BP Percentile --      Boys Systolic BP Percentile --      Boys Diastolic BP Percentile --      Pulse Rate 06/05/24 1715 94     Resp 06/05/24 1715 17     Temp 06/05/24 1715 98 F (36.7 C)     Temp Source 06/05/24 1715 Oral     SpO2 06/05/24 1715 96 %     Weight --      Height --      Head Circumference --      Peak Flow --      Pain Score 06/05/24 1720 0     Pain Loc --      Pain Education --      Exclude from Growth Chart --    No data found.  Updated Vital Signs BP (!) 154/84   Pulse 94   Temp 98 F (36.7 C) (Oral)   Resp 17   SpO2 96%    Physical Exam Vitals and nursing note reviewed.  Constitutional:      Appearance: Normal appearance. She is normal weight.  HENT:     Head: Normocephalic and atraumatic.     Mouth/Throat:     Mouth: Mucous membranes are moist.     Pharynx: Oropharynx is clear.  Eyes:     Extraocular Movements: Extraocular movements intact.     Conjunctiva/sclera: Conjunctivae normal.     Pupils: Pupils are equal, round, and reactive to light.  Cardiovascular:     Rate and Rhythm: Normal rate and regular rhythm.     Pulses: Normal pulses.     Heart sounds: Normal heart sounds.  Pulmonary:     Effort: Pulmonary effort is normal.     Breath sounds: Normal breath sounds. No wheezing, rhonchi or rales.  Musculoskeletal:        General: Normal range of motion.     Cervical back: Normal range of motion and neck supple.  Skin:    General: Skin is warm and dry.  Neurological:     General: No focal deficit present.     Mental Status: She is alert and oriented to person, place, and time. Mental status is at baseline.  Psychiatric:        Mood and Affect: Mood normal.        Behavior: Behavior normal.      UC Treatments /  Results  Labs (all labs ordered are listed, but only abnormal results are displayed) Labs Reviewed  POCT URINALYSIS DIP (MANUAL ENTRY) - Abnormal; Notable for the following components:      Result Value   Leukocytes, UA Small (1+) (*)    All other components within normal limits  URINE CULTURE    EKG   Radiology No results found.  Procedures Procedures (including critical care time)  Medications Ordered in UC Medications - No data to display  Initial Impression / Assessment and Plan / UC Course  I have reviewed the triage vital signs and the nursing notes.  Pertinent labs & imaging results that were available during my care of the patient were reviewed by me and considered in my medical decision making (see chart for details).     MDM: 1.  Acute cystitis without hematuria-UA revealed above, urine culture ordered, Rx'd cefdinir 300 mg capsule: Take 1 capsule twice daily x 7 days; 2.  Urinary frequency-Rx'd cefdinir 300 mg capsule: Take 1 capsule twice daily x 7 days. Advised patient take medication as directed with food  to completion. Encouraged increase daily water intake to 32 to 64 ounces per day while taking this medication.  Advised we will follow-up with urine culture results once received.  Advised if symptoms worsen and/or unresolved please follow-up with your PCP, [facility] urology, or here for further evaluation.  Patient discharged home, hemodynamically stable. Final Clinical Impressions(s) / UC Diagnoses   Final diagnoses:  Acute cystitis without hematuria  Urinary frequency     Discharge Instructions      Advised patient take medication as directed with food to completion. Encouraged increase daily water intake to 32 to 64 ounces per day while taking this medication.  Advised we will follow-up with urine culture results once received.  Advised if symptoms worsen and/or unresolved please follow-up with your PCP, [facility] urology, or here for further evaluation.     ED  Prescriptions     Medication Sig Dispense Auth. Provider   cefdinir (OMNICEF) 300 MG capsule Take 1 capsule (300 mg total) by mouth 2 (two) times daily for 7 days. 14 capsule Leahanna Buser, FNP      PDMP not reviewed this encounter.   Teddy Sharper, FNP 06/05/24 1747

## 2024-06-05 NOTE — ED Triage Notes (Signed)
Pt c/o urinary frequency x 3 days

## 2024-06-09 LAB — URINE CULTURE: Culture: 20000 — AB

## 2024-06-10 ENCOUNTER — Ambulatory Visit (INDEPENDENT_AMBULATORY_CARE_PROVIDER_SITE_OTHER): Admitting: Internal Medicine

## 2024-06-10 ENCOUNTER — Encounter: Payer: Self-pay | Admitting: Internal Medicine

## 2024-06-10 ENCOUNTER — Ambulatory Visit (HOSPITAL_COMMUNITY): Payer: Self-pay

## 2024-06-10 VITALS — BP 120/82 | HR 74 | Temp 98.1°F | Ht 63.0 in | Wt 190.0 lb

## 2024-06-10 DIAGNOSIS — Z170 Estrogen receptor positive status [ER+]: Secondary | ICD-10-CM | POA: Diagnosis not present

## 2024-06-10 DIAGNOSIS — C50411 Malignant neoplasm of upper-outer quadrant of right female breast: Secondary | ICD-10-CM | POA: Diagnosis not present

## 2024-06-10 DIAGNOSIS — I10 Essential (primary) hypertension: Secondary | ICD-10-CM

## 2024-06-10 DIAGNOSIS — F419 Anxiety disorder, unspecified: Secondary | ICD-10-CM

## 2024-06-10 MED ORDER — NITROFURANTOIN MONOHYD MACRO 100 MG PO CAPS
100.0000 mg | ORAL_CAPSULE | Freq: Two times a day (BID) | ORAL | 0 refills | Status: DC
Start: 2024-06-10 — End: 2024-07-10

## 2024-06-10 MED ORDER — ALPRAZOLAM 0.5 MG PO TABS: 0.2500 mg | ORAL_TABLET | Freq: Two times a day (BID) | ORAL | 5 refills | Status: DC | PRN

## 2024-06-10 NOTE — Assessment & Plan Note (Signed)
 Worse - probable Rx tolerance: increase Alprazolam   0.5 mg bid prn

## 2024-06-10 NOTE — Progress Notes (Signed)
 Subjective:  Patient ID: Diane Sawyer, female    DOB: 11/22/1946  Age: 78 y.o. MRN: [id number]  CC: Medical Management of Chronic Issues (3 mnth f/u )   HPI SHARLYNN SECKINGER presents for breast cancer- finished XRT. Pt started Tamoxifen  on 06/04/24 - c/o sweats... C/o more anxiety  Outpatient Medications Prior to Visit  Medication Sig Dispense Refill   acetaminophen  (TYLENOL ) 500 MG tablet Take 1,000 mg by mouth every 6 (six) hours as needed for mild pain.     amLODipine  (NORVASC ) 2.5 MG tablet Take 1 tablet (2.5 mg total) by mouth daily. 90 tablet 3   aspirin  EC 81 MG tablet Take 1 tablet (81 mg total) by mouth daily.     cefdinir  (OMNICEF ) 300 MG capsule Take 1 capsule (300 mg total) by mouth 2 (two) times daily for 7 days. 14 capsule 0   dorzolamide-timolol (COSOPT) 22.3-6.8 MG/ML ophthalmic solution 1 drop 2 (two) times daily.     estradiol  (ESTRACE ) 0.1 MG/GM vaginal cream Place 1 g vaginally 2 (two) times a week. 42.5 g 2   fexofenadine (ALLEGRA) 180 MG tablet Take 180 mg by mouth daily.     ipratropium (ATROVENT ) 0.06 % nasal spray Place 2 sprays into the nose 3 (three) times daily. Annual appt due must see provider for future refills 45 mL 3   irbesartan  (AVAPRO ) 150 MG tablet TAKE 1 TABLET(150 MG) BY MOUTH DAILY 90 tablet 3   IRON PO Take 65 mg by mouth daily.     latanoprost (XALATAN) 0.005 % ophthalmic solution INT 1 GTT IN OU HS     methocarbamol  (ROBAXIN ) 500 MG tablet Take 500 mg by mouth.     pantoprazole  (PROTONIX ) 40 MG tablet TAKE 1 TABLET(40 MG) BY MOUTH TWICE DAILY 180 tablet 3   tamoxifen  (NOLVADEX ) 20 MG tablet Take 1 tablet (20 mg total) by mouth daily. 30 tablet 3   traMADol  (ULTRAM ) 50 MG tablet Take 1 tablet (50 mg total) by mouth every 6 (six) hours as needed for moderate pain (pain score 4-6) or severe pain (pain score 7-10). 20 tablet 0   VITAMIN D  PO Take 1,000 Units by mouth daily.     ALPRAZolam  (XANAX ) 0.25 MG tablet TAKE 1 TABLET(0.25 MG) BY MOUTH TWICE DAILY  AS NEEDED FOR ANXIETY 180 tablet 1   No facility-administered medications prior to visit.    ROS: Review of Systems  Constitutional:  Negative for activity change, appetite change, chills, fatigue and unexpected weight change.  HENT:  Negative for congestion, mouth sores and sinus pressure.   Eyes:  Negative for visual disturbance.  Respiratory:  Negative for cough and chest tightness.   Gastrointestinal:  Negative for abdominal pain and nausea.  Genitourinary:  Negative for difficulty urinating, frequency and vaginal pain.  Musculoskeletal:  Negative for back pain and gait problem.  Skin:  Negative for pallor and rash.  Neurological:  Negative for dizziness, tremors, weakness, numbness and headaches.  Psychiatric/Behavioral:  Negative for confusion, decreased concentration, sleep disturbance and suicidal ideas. The patient is nervous/anxious.     Objective:  BP 120/82   Pulse 74   Temp 98.1 F (36.7 C) (Oral)   Ht 5' 3 (1.6 m)   Wt 190 lb (86.2 kg)   SpO2 98%   BMI 33.66 kg/m   BP Readings from Last 3 Encounters:  06/10/24 120/82  06/05/24 (!) 154/84  06/03/24 138/80    Wt Readings from Last 3 Encounters:  06/10/24 190 lb (86.2 kg)  06/03/24 194 lb 1.6 oz (88 kg)  05/30/24 196 lb 8 oz (89.1 kg)    Physical Exam Constitutional:      General: She is not in acute distress.    Appearance: She is well-developed.  HENT:     Head: Normocephalic.     Right Ear: External ear normal.     Left Ear: External ear normal.     Nose: Nose normal.  Eyes:     General:        Right eye: No discharge.        Left eye: No discharge.     Conjunctiva/sclera: Conjunctivae normal.     Pupils: Pupils are equal, round, and reactive to light.  Neck:     Thyroid : No thyromegaly.     Vascular: No JVD.     Trachea: No tracheal deviation.  Cardiovascular:     Rate and Rhythm: Normal rate and regular rhythm.     Heart sounds: Normal heart sounds.  Pulmonary:     Effort: No  respiratory distress.     Breath sounds: No stridor. No wheezing.  Abdominal:     General: Bowel sounds are normal. There is no distension.     Palpations: Abdomen is soft. There is no mass.     Tenderness: There is no abdominal tenderness. There is no guarding or rebound.  Musculoskeletal:        General: No tenderness.     Cervical back: Normal range of motion and neck supple. No rigidity.  Lymphadenopathy:     Cervical: No cervical adenopathy.  Skin:    Findings: No erythema or rash.  Neurological:     Mental Status: She is oriented to person, place, and time.     Cranial Nerves: No cranial nerve deficit.     Motor: No abnormal muscle tone.     Coordination: Coordination normal.     Deep Tendon Reflexes: Reflexes normal.  Psychiatric:        Mood and Affect: Mood normal.        Behavior: Behavior normal.        Thought Content: Thought content normal.        Judgment: Judgment normal.   Anxious   Lab Results  Component Value Date   WBC 6.2 03/06/2024   HGB 13.8 03/06/2024   HCT 41.0 03/06/2024   PLT 232.0 03/06/2024   GLUCOSE 88 03/06/2024   CHOL 181 03/06/2024   TRIG 107.0 03/06/2024   HDL 60.70 03/06/2024   LDLCALC 99 03/06/2024   ALT 7 03/06/2024   AST 13 03/06/2024   NA 141 03/06/2024   K 4.6 03/06/2024   CL 105 03/06/2024   CREATININE 0.95 03/06/2024   BUN 19 03/06/2024   CO2 29 03/06/2024   TSH 1.23 03/06/2024   HGBA1C 5.9 03/06/2024    No results found.  Assessment & Plan:   Problem List Items Addressed This Visit     Anxiety - Primary   Worse - probable Rx tolerance: increase Alprazolam   0.5 mg bid prn      Relevant Medications   ALPRAZolam  (XANAX ) 0.5 MG tablet      Meds ordered this encounter  Medications   ALPRAZolam  (XANAX ) 0.5 MG tablet    Sig: Take 0.5-1 tablets (0.25-0.5 mg total) by mouth 2 (two) times daily as needed for anxiety.    Dispense:  60 tablet    Refill:  5      Follow-up: Return in about 3 months (around  09/10/2024).  Marolyn Noel, MD

## 2024-06-10 NOTE — Assessment & Plan Note (Signed)
We cont on Amlodipine 2.5 mg/d and Irbesartan 150 mg/d

## 2024-06-10 NOTE — Assessment & Plan Note (Signed)
 Dr Vernetta Dr Lanny: Malignant neoplasm of upper-outer quadrant of right breast in female, estrogen receptor positive (HCC) pT1cN0M0, stage IA, ER+/PR+/HER2-, G1 -Discovered on screening mammogram -Status post lumpectomy, which showed a 1.2 cm invasive ductal carcinoma, with clear margins.  ER 100%, PR 100% positive, HER2 negative. -Given her advanced age and favorable features, adjuvant radiation was not recommended. -I recommend adjuvant antiestrogen tamoxifen  for 5 years. Dr Shannon -XRT x 4 wks  Baby ASA every day if tolerated (fam h/o blood clots?). On Tamoxifen  since 06/04/24 DVT prevention for travel Vit D qd

## 2024-07-08 ENCOUNTER — Ambulatory Visit (INDEPENDENT_AMBULATORY_CARE_PROVIDER_SITE_OTHER): Payer: BLUE CROSS/BLUE SHIELD

## 2024-07-08 VITALS — Ht 63.0 in | Wt 190.0 lb

## 2024-07-08 DIAGNOSIS — Z0000 Encounter for general adult medical examination without abnormal findings: Secondary | ICD-10-CM

## 2024-07-08 NOTE — Progress Notes (Addendum)
 Subjective:   Diane Sawyer is a 78 y.o. who presents for a Medicare Wellness preventive visit.  As a reminder, Annual Wellness Visits don't include a physical exam, and some assessments may be limited, especially if this visit is performed virtually. We may recommend an in-person follow-up visit with your provider if needed.  Visit Complete: Virtual I connected with  Diane Sawyer on 07/08/24 by a audio enabled telemedicine application and verified that I am speaking with the correct person using two identifiers.  Patient Location: Home  Provider Location: Home Office  I discussed the limitations of evaluation and management by telemedicine. The patient expressed understanding and agreed to proceed.  Vital Signs: Because this visit was a virtual/telehealth visit, some criteria may be missing or patient reported. Any vitals not documented were not able to be obtained and vitals that have been documented are patient reported.  VideoDeclined- This patient declined Librarian, academic. Therefore the visit was completed with audio only.  Persons Participating in Visit: Patient.  AWV Questionnaire: No: Patient Medicare AWV questionnaire was not completed prior to this visit.  Cardiac Risk Factors include: advanced age (>39men, >101 women);hypertension     Objective:    Today's Vitals   07/08/24 0929  Weight: 190 lb (86.2 kg)  Height: 5' 3 (1.6 m)   Body mass index is 33.66 kg/m.     07/08/2024    9:36 AM 06/03/2024   11:10 AM 05/30/2024    4:04 PM 03/18/2024    7:45 AM 02/13/2024    6:29 AM 07/03/2023   10:13 AM 06/27/2022   11:05 AM  Advanced Directives  Does Patient Have a Medical Advance Directive? No No No No No No No  Would patient like information on creating a medical advance directive?  No - Patient declined   No - Patient declined No - Patient declined No - Patient declined    Current Medications (verified) Outpatient Encounter Medications as  of 07/08/2024  Medication Sig   acetaminophen  (TYLENOL ) 500 MG tablet Take 1,000 mg by mouth every 6 (six) hours as needed for mild pain.   ALPRAZolam  (XANAX ) 0.5 MG tablet Take 0.5-1 tablets (0.25-0.5 mg total) by mouth 2 (two) times daily as needed for anxiety.   amLODipine  (NORVASC ) 2.5 MG tablet Take 1 tablet (2.5 mg total) by mouth daily.   aspirin  EC 81 MG tablet Take 1 tablet (81 mg total) by mouth daily.   dorzolamide-timolol (COSOPT) 22.3-6.8 MG/ML ophthalmic solution 1 drop 2 (two) times daily.   estradiol  (ESTRACE ) 0.1 MG/GM vaginal cream Place 1 g vaginally 2 (two) times a week.   fexofenadine (ALLEGRA) 180 MG tablet Take 180 mg by mouth daily.   ipratropium (ATROVENT ) 0.06 % nasal spray Place 2 sprays into the nose 3 (three) times daily. Annual appt due must see provider for future refills   irbesartan  (AVAPRO ) 150 MG tablet TAKE 1 TABLET(150 MG) BY MOUTH DAILY   IRON PO Take 65 mg by mouth daily.   latanoprost (XALATAN) 0.005 % ophthalmic solution INT 1 GTT IN OU HS   methocarbamol  (ROBAXIN ) 500 MG tablet Take 500 mg by mouth.   nitrofurantoin , macrocrystal-monohydrate, (MACROBID ) 100 MG capsule Take 1 capsule (100 mg total) by mouth 2 (two) times daily.   pantoprazole  (PROTONIX ) 40 MG tablet TAKE 1 TABLET(40 MG) BY MOUTH TWICE DAILY   tamoxifen  (NOLVADEX ) 20 MG tablet Take 1 tablet (20 mg total) by mouth daily.   traMADol  (ULTRAM ) 50 MG tablet Take 1  tablet (50 mg total) by mouth every 6 (six) hours as needed for moderate pain (pain score 4-6) or severe pain (pain score 7-10).   VITAMIN D  PO Take 1,000 Units by mouth daily.   No facility-administered encounter medications on file as of 07/08/2024.    Allergies (verified) Benazepril hcl, Levofloxacin, and Mobic [meloxicam]   History: Past Medical History:  Diagnosis Date   Adenomatous polyp of colon 2004   Allergic rhinitis    Anemia    Anxiety    Back pain    Bladder prolapse, female, acquired    Breast cancer (HCC)     Cataract 2018   Diverticulosis    GERD (gastroesophageal reflux disease)    Glaucoma    Hypertension    Lumbar compression fracture (HCC) 09/07/2016   x 2    Menopause    Urinary incontinence    Vitamin D  deficiency    Past Surgical History:  Procedure Laterality Date   BREAST BIOPSY Right 01/22/2024   US  RT BREAST BX W LOC DEV 1ST LESION IMG BX SPEC US  GUIDE 01/22/2024 [facility]   BREAST BIOPSY  02/12/2024   MM RT RADIOACTIVE SEED LOC MAMMO GUIDE 02/12/2024 [facility]   BREAST LUMPECTOMY WITH RADIOACTIVE SEED LOCALIZATION Right 02/13/2024   Procedure: RADIOACTIVE SEED GUIDED RIGHT BREAST LUMPECTOMY;  Surgeon: Vernetta Berg, MD;  Location: [facility];  Service: General;  Laterality: Right;   CATARACT EXTRACTION, BILATERAL  04/06/2017   with cypass stent implanted   COLONOSCOPY     cypess stent left eye Left 2018   POLYPECTOMY     Family History  Problem Relation Age of Onset   Stroke Mother 62   Hypertension Mother    Sudden death Mother    Anxiety disorder Mother    Lung cancer Father 42       smoker   Brain cancer Paternal Uncle        benign   Colon cancer Neg Hx    Esophageal cancer Neg Hx    Stomach cancer Neg Hx    Rectal cancer Neg Hx    Social History   Socioeconomic History   Marital status: Married    Spouse name: Carmen Tolliver   Number of children: 2   Years of education: Not on file   Highest education level: Not on file  Occupational History   Occupation: Hair dresser  Tobacco Use   Smoking status: Never   Smokeless tobacco: Never  Vaping Use   Vaping status: Never Used  Substance and Sexual Activity   Alcohol use: No   Drug use: No   Sexual activity: Not Currently    Partners: Male    Birth control/protection: Post-menopausal    Comment: First IC @ 18, Partners-1, DES-neg  Other Topics Concern   Not on file  Social History Narrative   Patient is very active within her community. She still works as a  Producer, television/film/video full time. She does gardening and yard work. She cooks big meals for her sister, who is caring for her husband who has dementia. She also volunteers.       Lives with husband/2025   Social Drivers of Health   Financial Resource Strain: Low Risk  (07/08/2024)   Overall Financial Resource Strain (CARDIA)    Difficulty of Paying Living Expenses: Not hard at all  Food Insecurity: No Food Insecurity (07/08/2024)   Hunger Vital Sign    Worried About Running Out of Food in the Last  Year: Never true    Ran Out of Food in the Last Year: Never true  Transportation Needs: No Transportation Needs (07/08/2024)   PRAPARE - Administrator, Civil Service (Medical): No    Lack of Transportation (Non-Medical): No  Physical Activity: Sufficiently Active (07/03/2023)   Exercise Vital Sign    Days of Exercise per Week: 7 days    Minutes of Exercise per Session: 60 min  Stress: No Stress Concern Present (07/08/2024)   [employer] of Occupational Health - Occupational Stress Questionnaire    Feeling of Stress: Not at all  Social Connections: Socially Integrated (07/08/2024)   Social Connection and Isolation Panel    Frequency of Communication with Friends and Family: More than three times a week    Frequency of Social Gatherings with Friends and Family: More than three times a week    Attends Religious Services: More than 4 times per year    Active Member of [employer] or Organizations: Yes    Attends Banker Meetings: Never    Marital Status: Married    Tobacco Counseling Counseling given: Not Answered    Clinical Intake:  Pre-visit preparation completed: Yes  Pain : No/denies pain     BMI - recorded: 33.66 Nutritional Status: BMI > 30  Obese Nutritional Risks: None Diabetes: No  Lab Results  Component Value Date   HGBA1C 5.9 03/06/2024   HGBA1C 5.7 10/21/2019   HGBA1C 5.6 09/25/2018     How often do you need to have someone help you when you read  instructions, pamphlets, or other written materials from your doctor or pharmacy?: 1 - Never  Interpreter Needed?: No  Information entered by :: Kelven Flater, RMA   Activities of Daily Living     07/08/2024    9:31 AM 02/13/2024    6:36 AM  In your present state of health, do you have any difficulty performing the following activities:  Hearing? 0 0  Vision? 0 0  Difficulty concentrating or making decisions? 0 0  Walking or climbing stairs? 0   Dressing or bathing? 0   Doing errands, shopping? 0   Preparing Food and eating ? N   Using the Toilet? N   In the past six months, have you accidently leaked urine? N   Do you have problems with loss of bowel control? N   Managing your Medications? N   Managing your Finances? N   Housekeeping or managing your Housekeeping? N     Patient Care Team: Plotnikov, Karlynn GAILS, MD as PCP - General Aneita Gwendlyn DASEN, MD (Inactive) as Attending Physician (Gastroenterology) Octavia Bruckner, MD as Consulting Physician (Ophthalmology) Jannis Kate Norris, MD as Consulting Physician (Obstetrics and Gynecology) Glean Stephane BROCKS, RN (Inactive) as Oncology Nurse Navigator Tyree Nanetta SAILOR, RN as Oncology Nurse Navigator Vernetta Berg, MD as Consulting Physician (General Surgery) Lanny Callander, MD as Consulting Physician (Hematology) Shannon Agent, MD as Consulting Physician (Radiation Oncology)  I have updated your Care Teams any recent Medical Services you may have received from other providers in the past year.     Assessment:   This is a routine wellness examination for Diane Sawyer.  Hearing/Vision screen Hearing Screening - Comments:: Denies hearing difficulties   Vision Screening - Comments:: Denies vision issues. / Dr. Octavia   Goals Addressed             This Visit's Progress    patient   On track    Maintain my current health  status, enjoy life and family.       Depression Screen     07/08/2024    9:39 AM 06/03/2024   11:10 AM  03/18/2024    7:50 AM 01/31/2024    4:30 PM 10/09/2023    2:33 PM 07/03/2023   10:16 AM 03/13/2023    9:41 AM  PHQ 2/9 Scores  PHQ - 2 Score 0 0 0 0 0 0 0  PHQ- 9 Score 0          Fall Risk     07/08/2024    9:37 AM 03/06/2024    8:08 AM 10/09/2023    2:33 PM 07/03/2023   10:20 AM 06/28/2023    3:28 PM  Fall Risk   Falls in the past year? 0 0 0 0 0  Number falls in past yr: 0 0 0 0 0  Injury with Fall? 0 0 0 0 0  Risk for fall due to :  No Fall Risks No Fall Risks No Fall Risks No Fall Risks  Follow up Falls evaluation completed;Falls prevention discussed Falls evaluation completed Falls evaluation completed Falls prevention discussed Falls evaluation completed    MEDICARE RISK AT HOME:  Medicare Risk at Home Any stairs in or around the home?: No If so, are there any without handrails?: No Home free of loose throw rugs in walkways, pet beds, electrical cords, etc?: Yes Adequate lighting in your home to reduce risk of falls?: Yes Life alert?: No Use of a cane, walker or w/c?: No Grab bars in the bathroom?: Yes Shower chair or bench in shower?: Yes Elevated toilet seat or a handicapped toilet?: Yes  TIMED UP AND GO:  Was the test performed?  No  Cognitive Function: Declined/Normal: No cognitive concerns noted by patient or family. Patient alert, oriented, able to answer questions appropriately and recall recent events. No signs of memory loss or confusion.        07/03/2023   10:19 AM 06/27/2022   11:06 AM  6CIT Screen  What Year? 0 points 0 points  What month? 0 points 0 points  What time? 0 points 0 points  Count back from 20 0 points 0 points  Months in reverse 0 points 0 points  Repeat phrase 0 points 0 points  Total Score 0 points 0 points    Immunizations Immunization History  Administered Date(s) Administered   Fluad Quad(high Dose 65+) 10/21/2019, 08/19/2020, 09/01/2021, 09/05/2022   Fluad Trivalent(High Dose 65+) 10/09/2023   Influenza Whole 09/01/2008,  08/31/2009, 09/27/2010   Influenza, High Dose Seasonal PF 09/18/2013, 11/21/2016, 10/17/2017, 12/31/2018   Influenza, Seasonal, Injecte, Preservative Fre 11/12/2012   Influenza,inj,Quad PF,6+ Mos 11/10/2014, 11/16/2015   PFIZER(Purple Top)SARS-COV-2 Vaccination 03/16/2020, 04/08/2020, 10/17/2020   Pneumococcal Conjugate-13 11/10/2014   Pneumococcal Polysaccharide-23 11/12/2012   Td 05/05/2010   Tdap 08/19/2020   Zoster, Live 12/21/2009    Screening Tests Health Maintenance  Topic Date Due   Hepatitis C Screening  Never done   COVID-19 Vaccine (4 - 2024-25 season) 08/06/2023   Medicare Annual Wellness (AWV)  07/02/2024   INFLUENZA VACCINE  07/05/2024   Colonoscopy  08/30/2025   DTaP/Tdap/Td (3 - Td or Tdap) 08/19/2030   Pneumococcal Vaccine: 50+ Years  Completed   DEXA SCAN  Completed   Hepatitis B Vaccines  Aged Out   HPV VACCINES  Aged Out   Meningococcal B Vaccine  Aged Out   Zoster Vaccines- Shingrix  Discontinued    Health Maintenance  Health Maintenance Due  Topic Date Due   Hepatitis C Screening  Never done   COVID-19 Vaccine (4 - 2024-25 season) 08/06/2023   Medicare Annual Wellness (AWV)  07/02/2024   INFLUENZA VACCINE  07/05/2024   Health Maintenance Items Addressed: Labs Ordered: Hep C screening due  Additional Screening:  Vision Screening: Recommended annual ophthalmology exams for early detection of glaucoma and other disorders of the eye. Would you like a referral to an eye doctor? No    Dental Screening: Recommended annual dental exams for proper oral hygiene  Community Resource Referral / Chronic Care Management: CRR required this visit?  No   CCM required this visit?  No   Plan:    I have personally reviewed and noted the following in the patient's chart:   Medical and social history Use of alcohol, tobacco or illicit drugs  Current medications and supplements including opioid prescriptions. Patient is not currently taking opioid  prescriptions. Functional ability and status Nutritional status Physical activity Advanced directives List of other physicians Hospitalizations, surgeries, and ER visits in previous 12 months Vitals Screenings to include cognitive, depression, and falls Referrals and appointments  In addition, I have reviewed and discussed with patient certain preventive protocols, quality metrics, and best practice recommendations. A written personalized care plan for preventive services as well as general preventive health recommendations were provided to patient.   Mani Celestin L Annel Zunker, CMA   07/08/2024   After Visit Summary: (MyChart) Due to this being a telephonic visit, the after visit summary with patients personalized plan was offered to patient via MyChart   Notes: Patient is due for a Hep C screening.  Patient is up to date on all other health maintenance with no concerns to address today.  Medical screening examination/treatment/procedure(s) were performed by non-physician practitioner and as supervising physician I was immediately available for consultation/collaboration.  I agree with above. Karlynn Noel, MD

## 2024-07-08 NOTE — Patient Instructions (Signed)
 Diane Sawyer , Thank you for taking time out of your busy schedule to complete your Annual Wellness Visit with me. I enjoyed our conversation and look forward to speaking with you again next year. I, as well as your care team,  appreciate your ongoing commitment to your health goals. Please review the following plan we discussed and let me know if I can assist you in the future. Your Game plan/ To Do List    Referrals: If you haven't heard from the office you've been referred to, please reach out to them at the phone provided.   Follow up Visits: We will see or speak with you next year for your Next Medicare AWV with our clinical staff Have you seen your provider in the last 6 months (3 months if uncontrolled diabetes)? Yes  Clinician Recommendations:  Aim for 30 minutes of exercise or brisk walking, 6-8 glasses of water, and 5 servings of fruits and vegetables each day. You are due for a Hep C screening and can get that done during your next office visit.  Keep up the good work.      This is a list of the screenings recommended for you:  Health Maintenance  Topic Date Due   Hepatitis C Screening  Never done   COVID-19 Vaccine (4 - 2024-25 season) 08/06/2023   Medicare Annual Wellness Visit  07/02/2024   Flu Shot  07/05/2024   Colon Cancer Screening  08/30/2025   DTaP/Tdap/Td vaccine (3 - Td or Tdap) 08/19/2030   Pneumococcal Vaccine for age over 83  Completed   DEXA scan (bone density measurement)  Completed   Hepatitis B Vaccine  Aged Out   HPV Vaccine  Aged Out   Meningitis B Vaccine  Aged Out   Zoster (Shingles) Vaccine  Discontinued    Advanced directives: (Declined) Advance directive discussed with you today. Even though you declined this today, please call our office should you change your mind, and we can give you the proper paperwork for you to fill out. Advance Care Planning is important because it:  [x]  Makes sure you receive the medical care that is consistent with your  values, goals, and preferences  [x]  It provides guidance to your family and loved ones and reduces their decisional burden about whether or not they are making the right decisions based on your wishes.  Follow the link provided in your after visit summary or read over the paperwork we have mailed to you to help you started getting your Advance Directives in place. If you need assistance in completing these, please reach out to us  so that we can help you!  See attachments for Preventive Care and Fall Prevention Tips.

## 2024-07-10 ENCOUNTER — Ambulatory Visit (INDEPENDENT_AMBULATORY_CARE_PROVIDER_SITE_OTHER): Admitting: Nurse Practitioner

## 2024-07-10 ENCOUNTER — Encounter: Payer: Self-pay | Admitting: Nurse Practitioner

## 2024-07-10 VITALS — BP 110/70 | HR 80

## 2024-07-10 DIAGNOSIS — N8111 Cystocele, midline: Secondary | ICD-10-CM

## 2024-07-10 DIAGNOSIS — N898 Other specified noninflammatory disorders of vagina: Secondary | ICD-10-CM

## 2024-07-10 DIAGNOSIS — N952 Postmenopausal atrophic vaginitis: Secondary | ICD-10-CM | POA: Diagnosis not present

## 2024-07-10 DIAGNOSIS — Z4689 Encounter for fitting and adjustment of other specified devices: Secondary | ICD-10-CM | POA: Diagnosis not present

## 2024-07-10 DIAGNOSIS — R8290 Unspecified abnormal findings in urine: Secondary | ICD-10-CM

## 2024-07-10 LAB — WET PREP FOR TRICH, YEAST, CLUE

## 2024-07-10 NOTE — Progress Notes (Signed)
   Acute Office Visit  Subjective:    Patient ID: Diane Sawyer, female    DOB: 09/07/1946, 78 y.o.   MRN: [id number]   HPI 78 y.o. presents today for pessary maintenance. Using #4 ring pessary with support. Good management. Stopped vaginal estrogen when diagnosed with ER+/PR+/HER2 neg right breast cancer in February. Had UTI 06/05/24 - feels her symptoms came back while at the beach this past weekend, so she took initial antibiotics that were prescribed. Last dose 2 days ago. Notices an odor.  No LMP recorded. Patient is postmenopausal.    Review of Systems  Constitutional: Negative.   Genitourinary: Negative.  Negative for dysuria, flank pain, frequency, hematuria, urgency, vaginal bleeding and vaginal discharge.       Urine odor       Objective:    Physical Exam Constitutional:      Appearance: Normal appearance.  Genitourinary:    General: Normal vulva.     Vagina: Vaginal discharge present. No erythema.     Cervix: Normal.     Comments: Cystocele present    BP 110/70   Pulse 80   SpO2 98%  Wt Readings from Last 3 Encounters:  07/08/24 190 lb (86.2 kg)  06/10/24 190 lb (86.2 kg)  06/03/24 194 lb 1.6 oz (88 kg)       [name], CMA present as Biomedical engineer.   Wet prep negative for pathogens  Assessment & Plan:   Problem List Items Addressed This Visit   None Visit Diagnoses       Pessary maintenance    -  Primary   Relevant Orders   WET PREP FOR TRICH, YEAST, CLUE     Vaginal atrophy         Cystocele, midline         Abnormal urine odor       Relevant Orders   Urine Culture     Vaginal discharge           Plan: No evidence of irritation, lesions or abnormal discharge. Wet prep negative. Pessary removed, clean and reinserted with ease. Offered pessary maintenance more often due to patient request but she declines at this time. Urine culture pending.   Return in about 3 months (around 10/10/2024) for Pessary maintenance.    [name] DNP, 2:48  PM 07/10/2024

## 2024-07-11 LAB — URINE CULTURE
MICRO NUMBER:: 16794717
Result:: NO GROWTH
SPECIMEN QUALITY:: ADEQUATE

## 2024-07-12 ENCOUNTER — Ambulatory Visit: Payer: Self-pay | Admitting: Nurse Practitioner

## 2024-08-12 DIAGNOSIS — H401122 Primary open-angle glaucoma, left eye, moderate stage: Secondary | ICD-10-CM | POA: Diagnosis not present

## 2024-08-12 DIAGNOSIS — H26492 Other secondary cataract, left eye: Secondary | ICD-10-CM | POA: Diagnosis not present

## 2024-08-12 DIAGNOSIS — Z961 Presence of intraocular lens: Secondary | ICD-10-CM | POA: Diagnosis not present

## 2024-08-12 DIAGNOSIS — H401111 Primary open-angle glaucoma, right eye, mild stage: Secondary | ICD-10-CM | POA: Diagnosis not present

## 2024-08-30 ENCOUNTER — Other Ambulatory Visit: Payer: Self-pay

## 2024-08-30 DIAGNOSIS — C50411 Malignant neoplasm of upper-outer quadrant of right female breast: Secondary | ICD-10-CM

## 2024-09-01 NOTE — Assessment & Plan Note (Signed)
 [id number], stage IA, ER+/PR+/HER2-, G1 -Discovered on screening mammogram -Status post lumpectomy, which showed a 1.2 cm invasive ductal carcinoma, with clear margins.  ER 100%, PR 100% positive, HER2 negative. -Given her advanced age and favorable features, adjuvant radiation was not recommended. -I recommend adjuvant antiestrogen tamoxifen  for 5 years. She started in July 2025

## 2024-09-02 ENCOUNTER — Inpatient Hospital Stay: Attending: Nurse Practitioner

## 2024-09-02 ENCOUNTER — Inpatient Hospital Stay (HOSPITAL_BASED_OUTPATIENT_CLINIC_OR_DEPARTMENT_OTHER): Admitting: Hematology

## 2024-09-02 VITALS — BP 120/80 | HR 73 | Temp 98.1°F | Resp 15 | Ht 63.0 in | Wt 198.8 lb

## 2024-09-02 DIAGNOSIS — Z170 Estrogen receptor positive status [ER+]: Secondary | ICD-10-CM

## 2024-09-02 DIAGNOSIS — Z1732 Human epidermal growth factor receptor 2 negative status: Secondary | ICD-10-CM | POA: Insufficient documentation

## 2024-09-02 DIAGNOSIS — Z79810 Long term (current) use of selective estrogen receptor modulators (SERMs): Secondary | ICD-10-CM | POA: Diagnosis not present

## 2024-09-02 DIAGNOSIS — Z7982 Long term (current) use of aspirin: Secondary | ICD-10-CM | POA: Diagnosis not present

## 2024-09-02 DIAGNOSIS — C50411 Malignant neoplasm of upper-outer quadrant of right female breast: Secondary | ICD-10-CM

## 2024-09-02 DIAGNOSIS — Z79899 Other long term (current) drug therapy: Secondary | ICD-10-CM | POA: Diagnosis not present

## 2024-09-02 DIAGNOSIS — Z1721 Progesterone receptor positive status: Secondary | ICD-10-CM | POA: Diagnosis not present

## 2024-09-02 DIAGNOSIS — Z923 Personal history of irradiation: Secondary | ICD-10-CM | POA: Insufficient documentation

## 2024-09-02 LAB — CBC WITH DIFFERENTIAL (CANCER CENTER ONLY)
Abs Immature Granulocytes: 0.01 K/uL (ref 0.00–0.07)
Basophils Absolute: 0 K/uL (ref 0.0–0.1)
Basophils Relative: 1 %
Eosinophils Absolute: 0.2 K/uL (ref 0.0–0.5)
Eosinophils Relative: 4 %
HCT: 37.6 % (ref 36.0–46.0)
Hemoglobin: 12.2 g/dL (ref 12.0–15.0)
Immature Granulocytes: 0 %
Lymphocytes Relative: 23 %
Lymphs Abs: 1 K/uL (ref 0.7–4.0)
MCH: 30.7 pg (ref 26.0–34.0)
MCHC: 32.4 g/dL (ref 30.0–36.0)
MCV: 94.5 fL (ref 80.0–100.0)
Monocytes Absolute: 0.4 K/uL (ref 0.1–1.0)
Monocytes Relative: 10 %
Neutro Abs: 2.9 K/uL (ref 1.7–7.7)
Neutrophils Relative %: 62 %
Platelet Count: 175 K/uL (ref 150–400)
RBC: 3.98 MIL/uL (ref 3.87–5.11)
RDW: 13.5 % (ref 11.5–15.5)
WBC Count: 4.6 K/uL (ref 4.0–10.5)
nRBC: 0 % (ref 0.0–0.2)

## 2024-09-02 LAB — CMP (CANCER CENTER ONLY)
ALT: 7 U/L (ref 0–44)
AST: 12 U/L — ABNORMAL LOW (ref 15–41)
Albumin: 3.7 g/dL (ref 3.5–5.0)
Alkaline Phosphatase: 58 U/L (ref 38–126)
Anion gap: 4 — ABNORMAL LOW (ref 5–15)
BUN: 16 mg/dL (ref 8–23)
CO2: 29 mmol/L (ref 22–32)
Calcium: 8.4 mg/dL — ABNORMAL LOW (ref 8.9–10.3)
Chloride: 108 mmol/L (ref 98–111)
Creatinine: 0.81 mg/dL (ref 0.44–1.00)
GFR, Estimated: >60 mL/min (ref >60)
Glucose, Bld: 83 mg/dL (ref 70–99)
Potassium: 4.5 mmol/L (ref 3.5–5.1)
Sodium: 141 mmol/L (ref 135–145)
Total Bilirubin: 0.4 mg/dL (ref 0.0–1.2)
Total Protein: 5.9 g/dL — ABNORMAL LOW (ref 6.5–8.1)

## 2024-09-02 MED ORDER — TAMOXIFEN CITRATE 20 MG PO TABS: 20.0000 mg | ORAL_TABLET | Freq: Every day | ORAL | 3 refills | Status: AC

## 2024-09-02 MED ORDER — NYSTATIN 100000 UNIT/GM EX POWD: 1.0000 | Freq: Three times a day (TID) | CUTANEOUS | 0 refills | Status: AC

## 2024-09-02 NOTE — Addendum Note (Signed)
 Addended by: LANNY CALLANDER on: 09/02/2024 01:11 PM   Modules accepted: Orders

## 2024-09-02 NOTE — Progress Notes (Addendum)
 [facility]   [phone] [phone]   Clinic Follow up Note   Patient Care Team: Plotnikov, Karlynn GAILS, MD as PCP - General Aneita Gwendlyn DASEN, MD (Inactive) as Attending Physician (Gastroenterology) Octavia Bruckner, MD as Consulting Physician (Ophthalmology) Jannis Kate Norris, MD as Consulting Physician (Obstetrics and Gynecology) Tyree Nanetta SAILOR, RN as Oncology Nurse Navigator Vernetta Berg, MD as Consulting Physician (General Surgery) Lanny Callander, MD as Consulting Physician (Hematology) Shannon Agent, MD as Consulting Physician (Radiation Oncology)  Date of Service:  09/02/2024  CHIEF COMPLAINT: f/u of right breast cancer  CURRENT THERAPY:  Adjuvant tamoxifen   Oncology History   Malignant neoplasm of upper-outer quadrant of right breast in female, estrogen receptor positive (HCC) pT1cN0M0, stage IA, ER+/PR+/HER2-, G1 -Discovered on screening mammogram -Status post lumpectomy, which showed a 1.2 cm invasive ductal carcinoma, with clear margins.  ER 100%, PR 100% positive, HER2 negative. -Given her advanced age and favorable features, adjuvant radiation was not recommended. -I recommend adjuvant antiestrogen tamoxifen  for 5 years. She started in July 2025  Assessment & Plan Estrogen receptor positive right breast cancer Status post lumpectomy and radiation therapy. Currently on tamoxifen  therapy without significant side effects such as hot flashes. Reports fatigue, which may be related to tamoxifen  or radiation therapy. Loose stools likely related to radiation therapy. Weight fluctuations with concerns about metabolism and weight management. Active, working in a salon and walking frequently. - Continue tamoxifen  therapy. - Encourage low-carb, low-calorie diet with lean meats, vegetables, and fruits. - Encourage regular physical activity and weight monitoring. - Order diagnostic mammogram at the breast center before next visit. -  Continue calcium and vitamin D  supplementation.  Fatigue and loose stools associated with cancer treatment Fatigue and loose stools likely related to tamoxifen  and radiation therapy. Fatigue may improve as the body adjusts to tamoxifen . Loose stools are progressive but not completely resolved. - Reassure that fatigue may improve over time as the body adjusts to tamoxifen .    Plan - She is clinically doing well, tolerating tamoxifen  well, will continue -I called in nystatin powder for her rash underneath breasts. -I ordered the next mammogram in November 2025 - Lab and follow-up with NP in 6 months.   SUMMARY OF ONCOLOGIC HISTORY: Oncology History  Malignant neoplasm of upper-outer quadrant of right breast in female, estrogen receptor positive (HCC)  01/22/2024 Cancer Staging   Staging form: Breast, AJCC 8th Edition - Clinical stage from 01/22/2024: Stage IA (cT1b, cN0, cM0, G1, ER+, PR+, HER2-) - Signed by Lanny Callander, MD on 01/30/2024 Stage prefix: Initial diagnosis Histologic grading system: 3 grade system   01/29/2024 Initial Diagnosis   Malignant neoplasm of upper-outer quadrant of right breast in female, estrogen receptor positive (HCC)   02/13/2024 Cancer Staging   Staging form: Breast, AJCC 8th Edition - Pathologic stage from 02/13/2024: Stage IA (pT1c, pN0, cM0, G1, ER+, PR+, HER2-) - Signed by Lanny Callander, MD on 02/27/2024 Stage prefix: Initial diagnosis Histologic grading system: 3 grade system Residual tumor (R): R0   02/16/2024 Genetic Testing   Negative genetic testing on the CancerNext-Expanded+RNAinsight panel.  POLE VUS identified.  The report date is February 16, 2024.  The CancerNext-Expanded gene panel offered by [facility] and includes sequencing, rearrangement, and RNA analysis for the following 76 genes: AIP, ALK, APC, ATM, AXIN2, BAP1, BARD1, BMPR1A, BRCA1, BRCA2, BRIP1, CDC73, CDH1, CDK4, CDKN1B, CDKN2A, CEBPA, CHEK2, CTNNA1, DDX41, DICER1, ETV6, FH, FLCN, GATA2,  LZTR1, MAX, MBD4, MEN1, MET, MLH1, MSH2, MSH3, MSH6, MUTYH, NF1,  NF2, NTHL1, PALB2, PHOX2B, PMS2, POT1, PRKAR1A, PTCH1, PTEN, RAD51C, RAD51D, RB1, RET, RUNX1, SDHA, SDHAF2, SDHB, SDHC, SDHD, SMAD4, SMARCA4, SMARCB1, SMARCE1, STK11, SUFU, TMEM127, TP53, TSC1, TSC2, VHL, and WT1 (sequencing and deletion/duplication); EGFR, HOXB13, KIT, MITF, PDGFRA, POLD1, and POLE (sequencing only); EPCAM and GREM1 (deletion/duplication only).     03/26/2024 - 04/24/2024 Radiation Therapy   Intent: Curative  First Treatment Date: 2024-03-26 Last Treatment Date: 2024-04-24   Plan Name: Breast_R Site: Breast, Right Technique: 3D Mode: Photon Dose Per Fraction: 2.67 Gy Prescribed Dose (Delivered / Prescribed): 40.05 Gy / 40.05 Gy Prescribed Fxs (Delivered / Prescribed): 15 / 15   Plan Name: Breast_R_Bst Site: Breast, Right Technique: 3D Mode: Photon Dose Per Fraction: 2 Gy Prescribed Dose (Delivered / Prescribed): 12 Gy / 12 Gy Prescribed Fxs (Delivered / Prescribed): 6 / 6      Discussed the use of AI scribe software for clinical note transcription with the patient, who gave verbal consent to proceed.  History of Present Illness Diane Sawyer is a 78 year old female with breast cancer who presents for follow-up.  She is on tamoxifen  following radiation therapy for breast cancer and experiences no significant side effects such as hot flashes. She takes tamoxifen  at night to avoid potential dizziness. She has swelling and redness in her right breast, which she manages with cream and massage.  She experiences fatigue, which she attributes to her work schedule and possibly the tamoxifen  or prior radiation therapy. Her fatigue level has not significantly increased since starting tamoxifen . She also experiences loose stools, which she associates with the radiation treatment, noting that it varies throughout the week.  She is concerned about her slow metabolism and weight fluctuations, with a recent weight of  195 pounds. She monitors her weight frequently and is trying to manage it through diet and exercise. She takes calcium and vitamin D  supplements to support bone health.     All other systems were reviewed with the patient and are negative.  MEDICAL HISTORY:  Past Medical History:  Diagnosis Date   Adenomatous polyp of colon 2004   Allergic rhinitis    Anemia    Anxiety    Back pain    Bladder prolapse, female, acquired    Breast cancer (HCC)    Cataract 2018   Diverticulosis    GERD (gastroesophageal reflux disease)    Glaucoma    Hypertension    Lumbar compression fracture (HCC) 09/07/2016   x 2    Menopause    Urinary incontinence    Vitamin D  deficiency     SURGICAL HISTORY: Past Surgical History:  Procedure Laterality Date   BREAST BIOPSY Right 01/22/2024   US  RT BREAST BX W LOC DEV 1ST LESION IMG BX SPEC US  GUIDE 01/22/2024 [facility]MO[facility]   BREAST BIOPSY  02/12/2024   MM RT RADIOACTIVE SEED LOC MAMMO GUIDE 02/12/2024 [facility] MAMMOGR[facility]   BREAST LUMPECTOMY WITH RADIOACTIVE SEED LOCALIZATION Right 02/13/2024   Procedure: RADIOACTIVE SEED GUIDED RIGHT BREAST LUMPECTOMY;  Surgeon: Vernetta Berg, MD;  Location: [facility];  Service: General;  Laterality: Right;   CATARACT EXTRACTION, BILATERAL  04/06/2017   with cypass stent implanted   COLONOSCOPY     cypess stent left eye Left 2018   POLYPECTOMY      I have reviewed the social history and family history with the patient and they are unchanged from previous note.  ALLERGIES:  is allergic to benazepril hcl, levofloxacin, and mobic [meloxicam].  MEDICATIONS:  Current  Outpatient Medications  Medication Sig Dispense Refill   acetaminophen  (TYLENOL ) 500 MG tablet Take 1,000 mg by mouth every 6 (six) hours as needed for mild pain.     ALPRAZolam  (XANAX ) 0.5 MG tablet Take 0.5-1 tablets (0.25-0.5 mg total) by mouth 2 (two) times daily as needed for anxiety. 60 tablet 5   amLODipine  (NORVASC ) 2.5 MG  tablet Take 1 tablet (2.5 mg total) by mouth daily. 90 tablet 3   aspirin  EC 81 MG tablet Take 1 tablet (81 mg total) by mouth daily.     dorzolamide-timolol (COSOPT) 22.3-6.8 MG/ML ophthalmic solution 1 drop 2 (two) times daily.     fexofenadine (ALLEGRA) 180 MG tablet Take 180 mg by mouth daily.     ipratropium (ATROVENT ) 0.06 % nasal spray Place 2 sprays into the nose 3 (three) times daily. Annual appt due must see provider for future refills 45 mL 3   irbesartan  (AVAPRO ) 150 MG tablet TAKE 1 TABLET(150 MG) BY MOUTH DAILY 90 tablet 3   IRON PO Take 65 mg by mouth daily.     latanoprost (XALATAN) 0.005 % ophthalmic solution INT 1 GTT IN OU HS     methocarbamol  (ROBAXIN ) 500 MG tablet Take 500 mg by mouth.     nystatin (MYCOSTATIN/NYSTOP) powder Apply 1 Application topically 3 (three) times daily. To skin rashes below breast 15 g 0   pantoprazole  (PROTONIX ) 40 MG tablet TAKE 1 TABLET(40 MG) BY MOUTH TWICE DAILY 180 tablet 3   VITAMIN D  PO Take 1,000 Units by mouth daily.     estradiol  (ESTRACE ) 0.1 MG/GM vaginal cream Place 1 g vaginally 2 (two) times a week. (Patient not taking: Reported on 09/02/2024) 42.5 g 2   tamoxifen  (NOLVADEX ) 20 MG tablet Take 1 tablet (20 mg total) by mouth daily. 90 tablet 3   No current facility-administered medications for this visit.    PHYSICAL EXAMINATION: ECOG PERFORMANCE STATUS: 0 - Asymptomatic  Vitals:   09/02/24 1000 09/02/24 1018  BP: (!) 144/96 120/80  Pulse: 73   Resp: 15   Temp: 98.1 F (36.7 C)   SpO2: 99% 100%   Wt Readings from Last 3 Encounters:  09/02/24 198 lb 12.8 oz (90.2 kg)  07/08/24 190 lb (86.2 kg)  06/10/24 190 lb (86.2 kg)     GENERAL:alert, no distress and comfortable SKIN: skin color, texture, turgor are normal, no rashes or significant lesions EYES: normal, Conjunctiva are pink and non-injected, sclera clear NECK: supple, thyroid  normal size, non-tender, without nodularity LYMPH:  no palpable lymphadenopathy in the  cervical, axillary  LUNGS: clear to auscultation and percussion with normal breathing effort HEART: regular rate & rhythm and no murmurs and no lower extremity edema ABDOMEN:abdomen soft, non-tender and normal bowel sounds Musculoskeletal:no cyanosis of digits and no clubbing  NEURO: alert & oriented x 3 with fluent speech, no focal motor/sensory deficits BREAST: Right breast slightly swollen. Skin underneath breast slightly red.  No palpable prominence or adenopathy. Physical Exam   LABORATORY DATA:  I have reviewed the data as listed    Latest Ref Rng & Units 09/02/2024   10:00 AM 03/06/2024    8:50 AM 01/31/2024   12:24 PM  CBC  WBC 4.0 - 10.5 K/uL 4.6  6.2  7.4   Hemoglobin 12.0 - 15.0 g/dL 87.7  86.1  86.0   Hematocrit 36.0 - 46.0 % 37.6  41.0  43.6   Platelets 150 - 400 K/uL 175  232.0  211  Latest Ref Rng & Units 09/02/2024   10:00 AM 03/06/2024    8:50 AM 01/31/2024   12:24 PM  CMP  Glucose 70 - 99 mg/dL 83  88  879   BUN 8 - 23 mg/dL 16  19  18    Creatinine 0.44 - 1.00 mg/dL 9.18  9.04  9.00   Sodium 135 - 145 mmol/L 141  141  141   Potassium 3.5 - 5.1 mmol/L 4.5  4.6  4.0   Chloride 98 - 111 mmol/L 108  105  106   CO2 22 - 32 mmol/L 29  29  28    Calcium 8.9 - 10.3 mg/dL 8.4  9.1  9.0   Total Protein 6.5 - 8.1 g/dL 5.9  6.9  6.7   Total Bilirubin 0.0 - 1.2 mg/dL 0.4  0.5  0.4   Alkaline Phos 38 - 126 U/L 58  89  99   AST 15 - 41 U/L 12  13  13    ALT 0 - 44 U/L 7  7  7        RADIOGRAPHIC STUDIES: I have personally reviewed the radiological images as listed and agreed with the findings in the report. No results found.    Orders Placed This Encounter  Procedures   MM 3D DIAGNOSTIC MAMMOGRAM BILATERAL BREAST    MCR ANNUAL/01/01/2024 BCG/NO PROBLEMS NO NEEDS RT LUMPECT 2025/TOMO LS W/PT PT AWARE 24 HRS CX /$75 NFS    Standing Status:   Future    Expected Date:   01/03/2025    Expiration Date:   09/02/2025    Reason for Exam (SYMPTOM  OR DIAGNOSIS  REQUIRED):   screening    Preferred imaging location?:   [facility]   All questions were answered. The patient knows to call the clinic with any problems, questions or concerns. No barriers to learning was detected. The total time spent in the appointment was 30 minutes, including review of chart and various tests results, discussions about plan of care and coordination of care plan     Onita Mattock, MD 09/02/2024

## 2024-09-25 ENCOUNTER — Ambulatory Visit: Admitting: Internal Medicine

## 2024-09-25 ENCOUNTER — Encounter: Payer: Self-pay | Admitting: Internal Medicine

## 2024-09-25 VITALS — BP 120/74 | HR 71 | Temp 98.2°F | Ht 63.0 in | Wt 196.0 lb

## 2024-09-25 DIAGNOSIS — I10 Essential (primary) hypertension: Secondary | ICD-10-CM

## 2024-09-25 DIAGNOSIS — E559 Vitamin D deficiency, unspecified: Secondary | ICD-10-CM | POA: Diagnosis not present

## 2024-09-25 DIAGNOSIS — R5383 Other fatigue: Secondary | ICD-10-CM | POA: Diagnosis not present

## 2024-09-25 DIAGNOSIS — F419 Anxiety disorder, unspecified: Secondary | ICD-10-CM

## 2024-09-25 DIAGNOSIS — Z170 Estrogen receptor positive status [ER+]: Secondary | ICD-10-CM | POA: Diagnosis not present

## 2024-09-25 DIAGNOSIS — R7303 Prediabetes: Secondary | ICD-10-CM

## 2024-09-25 DIAGNOSIS — C50411 Malignant neoplasm of upper-outer quadrant of right female breast: Secondary | ICD-10-CM | POA: Diagnosis not present

## 2024-09-25 DIAGNOSIS — G4700 Insomnia, unspecified: Secondary | ICD-10-CM

## 2024-09-25 DIAGNOSIS — Z23 Encounter for immunization: Secondary | ICD-10-CM | POA: Diagnosis not present

## 2024-09-25 DIAGNOSIS — K219 Gastro-esophageal reflux disease without esophagitis: Secondary | ICD-10-CM | POA: Diagnosis not present

## 2024-09-25 NOTE — Assessment & Plan Note (Signed)
 On Protonix bid

## 2024-09-25 NOTE — Assessment & Plan Note (Signed)
 Eat more protein Zepbound vials for wt loss were discussed

## 2024-09-25 NOTE — Assessment & Plan Note (Signed)
 Better after we increased Alprazolam   0.5 mg bid prn

## 2024-09-25 NOTE — Assessment & Plan Note (Signed)
We cont on Amlodipine 2.5 mg/d and Irbesartan 150 mg/d

## 2024-09-25 NOTE — Assessment & Plan Note (Signed)
 Check A1c.

## 2024-09-25 NOTE — Progress Notes (Signed)
 Subjective:  Patient ID: Diane Sawyer, female    DOB: Jan 23, 1946  Age: 78 y.o. MRN: [id number]  CC: Medical Management of Chronic Issues (3 Month follow up. Med reconciliation for 2000 units of vitamin D  instead of 1000 units, if she should take 1 2000 unit or 2 1000 units?)   HPI Diane Sawyer presents for anxiety, HTN, GERD f/u Sleeping better  Outpatient Medications Prior to Visit  Medication Sig Dispense Refill   acetaminophen  (TYLENOL ) 500 MG tablet Take 1,000 mg by mouth every 6 (six) hours as needed for mild pain.     ALPRAZolam  (XANAX ) 0.5 MG tablet Take 0.5-1 tablets (0.25-0.5 mg total) by mouth 2 (two) times daily as needed for anxiety. 60 tablet 5   amLODipine  (NORVASC ) 2.5 MG tablet Take 1 tablet (2.5 mg total) by mouth daily. 90 tablet 3   aspirin  EC 81 MG tablet Take 1 tablet (81 mg total) by mouth daily.     dorzolamide-timolol (COSOPT) 22.3-6.8 MG/ML ophthalmic solution 1 drop 2 (two) times daily.     fexofenadine (ALLEGRA) 180 MG tablet Take 180 mg by mouth daily.     ipratropium (ATROVENT ) 0.06 % nasal spray Place 2 sprays into the nose 3 (three) times daily. Annual appt due must see provider for future refills 45 mL 3   irbesartan  (AVAPRO ) 150 MG tablet TAKE 1 TABLET(150 MG) BY MOUTH DAILY 90 tablet 3   IRON PO Take 65 mg by mouth daily.     latanoprost (XALATAN) 0.005 % ophthalmic solution INT 1 GTT IN OU HS     methocarbamol  (ROBAXIN ) 500 MG tablet Take 500 mg by mouth.     nystatin (MYCOSTATIN/NYSTOP) powder Apply 1 Application topically 3 (three) times daily. To skin rashes below breast 15 g 0   pantoprazole  (PROTONIX ) 40 MG tablet TAKE 1 TABLET(40 MG) BY MOUTH TWICE DAILY 180 tablet 3   tamoxifen  (NOLVADEX ) 20 MG tablet Take 1 tablet (20 mg total) by mouth daily. 90 tablet 3   VITAMIN D  PO Take 1,000 Units by mouth daily.     No facility-administered medications prior to visit.    ROS: Review of Systems  Constitutional:  Negative for activity change, appetite  change, chills, fatigue and unexpected weight change.  HENT:  Negative for congestion, mouth sores and sinus pressure.   Eyes:  Negative for visual disturbance.  Respiratory:  Negative for cough and chest tightness.   Gastrointestinal:  Negative for abdominal pain and nausea.  Genitourinary:  Negative for difficulty urinating, frequency and vaginal pain.  Musculoskeletal:  Negative for back pain and gait problem.  Skin:  Negative for pallor and rash.  Neurological:  Negative for dizziness, tremors, weakness, numbness and headaches.  Psychiatric/Behavioral:  Positive for sleep disturbance. Negative for confusion and decreased concentration.     Objective:  BP 120/74   Pulse 71   Temp 98.2 F (36.8 C)   Ht 5' 3 (1.6 m)   Wt 196 lb (88.9 kg)   SpO2 98%   BMI 34.72 kg/m   BP Readings from Last 3 Encounters:  09/25/24 120/74  09/02/24 120/80  07/10/24 110/70    Wt Readings from Last 3 Encounters:  09/25/24 196 lb (88.9 kg)  09/02/24 198 lb 12.8 oz (90.2 kg)  07/08/24 190 lb (86.2 kg)    Physical Exam Constitutional:      General: She is not in acute distress.    Appearance: She is well-developed. She is obese. She is not diaphoretic.  HENT:  Head: Normocephalic.     Right Ear: External ear normal.     Left Ear: External ear normal.     Nose: Nose normal.  Eyes:     General:        Right eye: No discharge.        Left eye: No discharge.     Conjunctiva/sclera: Conjunctivae normal.     Pupils: Pupils are equal, round, and reactive to light.  Neck:     Thyroid : No thyromegaly.     Vascular: No JVD.     Trachea: No tracheal deviation.  Cardiovascular:     Rate and Rhythm: Normal rate and regular rhythm.     Heart sounds: Normal heart sounds.  Pulmonary:     Effort: No respiratory distress.     Breath sounds: No stridor. No wheezing.  Abdominal:     General: Bowel sounds are normal. There is no distension.     Palpations: Abdomen is soft. There is no mass.      Tenderness: There is no abdominal tenderness. There is no guarding or rebound.  Musculoskeletal:        General: No tenderness.     Cervical back: Normal range of motion and neck supple. No rigidity.  Lymphadenopathy:     Cervical: No cervical adenopathy.  Skin:    Findings: No erythema or rash.  Neurological:     Cranial Nerves: No cranial nerve deficit.     Motor: No abnormal muscle tone.     Coordination: Coordination normal.     Deep Tendon Reflexes: Reflexes normal.  Psychiatric:        Behavior: Behavior normal.        Thought Content: Thought content normal.        Judgment: Judgment normal.    Eat more protein Zepbound vials for wt loss were discussed Lab Results  Component Value Date   WBC 4.6 09/02/2024   HGB 12.2 09/02/2024   HCT 37.6 09/02/2024   PLT 175 09/02/2024   GLUCOSE 83 09/02/2024   CHOL 181 03/06/2024   TRIG 107.0 03/06/2024   HDL 60.70 03/06/2024   LDLCALC 99 03/06/2024   ALT 7 09/02/2024   AST 12 (L) 09/02/2024   NA 141 09/02/2024   K 4.5 09/02/2024   CL 108 09/02/2024   CREATININE 0.81 09/02/2024   BUN 16 09/02/2024   CO2 29 09/02/2024   TSH 1.23 03/06/2024   HGBA1C 5.9 03/06/2024    No results found.  Assessment & Plan:   Problem List Items Addressed This Visit     Anxiety - Primary    Better after we increased Alprazolam   0.5 mg bid prn      Gastroesophageal reflux disease   On Protonix  bid      Hypertension   We cont on Amlodipine  2.5 mg/d and Irbesartan  150 mg/d      Insomnia    Better after we increased Alprazolam   0.5 mg bid prn      Malignant neoplasm of upper-outer quadrant of right breast in female, estrogen receptor positive (HCC)   On Tamoxifen       Other fatigue   Eat more protein Zepbound vials for wt loss were discussed      Prediabetes   Check A1c      Vitamin D  deficiency   Take Vit D         No orders of the defined types were placed in this encounter.     Follow-up: Return in about  3  months (around 12/26/2024) for a follow-up visit.  Marolyn Noel, MD

## 2024-09-25 NOTE — Patient Instructions (Signed)
 Eat more protein Zepbound vials for wt loss?

## 2024-09-25 NOTE — Assessment & Plan Note (Signed)
On Tamoxifen

## 2024-09-25 NOTE — Addendum Note (Signed)
 Addended byBETHA LUCETTA CLEATRICE LELON on: 09/25/2024 11:09 AM   Modules accepted: Orders

## 2024-09-25 NOTE — Assessment & Plan Note (Signed)
Take Vit D 

## 2024-10-09 ENCOUNTER — Encounter: Payer: Self-pay | Admitting: Nurse Practitioner

## 2024-10-09 ENCOUNTER — Ambulatory Visit (INDEPENDENT_AMBULATORY_CARE_PROVIDER_SITE_OTHER): Admitting: Nurse Practitioner

## 2024-10-09 VITALS — BP 124/80 | HR 100

## 2024-10-09 DIAGNOSIS — N952 Postmenopausal atrophic vaginitis: Secondary | ICD-10-CM

## 2024-10-09 DIAGNOSIS — N8111 Cystocele, midline: Secondary | ICD-10-CM

## 2024-10-09 DIAGNOSIS — Z4689 Encounter for fitting and adjustment of other specified devices: Secondary | ICD-10-CM | POA: Diagnosis not present

## 2024-10-09 NOTE — Progress Notes (Signed)
   Acute Office Visit  Subjective:    Patient ID: Diane Sawyer, female    DOB: 07-22-1946, 78 y.o.   MRN: [id number]   HPI 78 y.o. presents today for pessary maintenance. Using #4 ring pessary with support. Good management. Has had a couple episodes at night where she did not make it to the bathroom. Stopped vaginal estrogen when diagnosed with ER+/PR+/HER2 neg right breast cancer in February.   No LMP recorded. Patient is postmenopausal.    Review of Systems  Constitutional: Negative.   Genitourinary: Negative.  Negative for dysuria, flank pain, frequency, hematuria, urgency, vaginal bleeding and vaginal discharge.       Urine odor       Objective:    Physical Exam Constitutional:      Appearance: Normal appearance.  Genitourinary:    General: Normal vulva.     Vagina: Vaginal discharge present. No erythema.     Cervix: Normal.     Comments: Cystocele present    BP 124/80   Pulse 100   SpO2 97%  Wt Readings from Last 3 Encounters:  09/25/24 196 lb (88.9 kg)  09/02/24 198 lb 12.8 oz (90.2 kg)  07/08/24 190 lb (86.2 kg)       Diane Sawyer, CMA present as biomedical engineer.   Assessment & Plan:   Problem List Items Addressed This Visit   None Visit Diagnoses       Pessary maintenance    -  Primary     Vaginal atrophy         Cystocele, midline          Plan: Pessary removed, clean and reinserted with ease. Recommend avoiding fluids 2 hours before bed to avoid nighttime urination.   Return in about 3 months (around 01/09/2025) for Pessary maintenance.    Diane DELENA Shutter DNP, 2:47 PM 10/09/2024

## 2024-10-24 ENCOUNTER — Other Ambulatory Visit: Payer: Self-pay | Admitting: Internal Medicine

## 2024-12-24 ENCOUNTER — Other Ambulatory Visit: Payer: Self-pay | Admitting: Internal Medicine

## 2024-12-30 ENCOUNTER — Ambulatory Visit: Admitting: Internal Medicine

## 2025-01-06 ENCOUNTER — Encounter

## 2025-01-13 ENCOUNTER — Ambulatory Visit: Admitting: Nurse Practitioner

## 2025-01-20 ENCOUNTER — Ambulatory Visit: Admitting: Internal Medicine

## 2025-02-03 ENCOUNTER — Encounter

## 2025-03-03 ENCOUNTER — Inpatient Hospital Stay

## 2025-03-03 ENCOUNTER — Inpatient Hospital Stay: Admitting: Nurse Practitioner

## 2025-07-09 ENCOUNTER — Ambulatory Visit
